# Patient Record
Sex: Female | Born: 1950 | Race: White | Hispanic: No | Marital: Married | Smoking: Never smoker
Health system: Southern US, Community
[De-identification: ages and names within clinical notes are randomized; demographics above are authoritative.]

## PROBLEM LIST (undated history)

## (undated) DIAGNOSIS — M1990 Unspecified osteoarthritis, unspecified site: Secondary | ICD-10-CM

## (undated) DIAGNOSIS — Z789 Other specified health status: Secondary | ICD-10-CM

## (undated) DIAGNOSIS — R011 Cardiac murmur, unspecified: Secondary | ICD-10-CM

## (undated) DIAGNOSIS — R7303 Prediabetes: Secondary | ICD-10-CM

## (undated) DIAGNOSIS — K219 Gastro-esophageal reflux disease without esophagitis: Secondary | ICD-10-CM

## (undated) DIAGNOSIS — S83209A Unspecified tear of unspecified meniscus, current injury, unspecified knee, initial encounter: Secondary | ICD-10-CM

## (undated) DIAGNOSIS — J45909 Unspecified asthma, uncomplicated: Secondary | ICD-10-CM

## (undated) DIAGNOSIS — Z973 Presence of spectacles and contact lenses: Secondary | ICD-10-CM

## (undated) HISTORY — PX: CHOLECYSTECTOMY: SHX55

## (undated) HISTORY — PX: TUBAL LIGATION: SHX77

## (undated) HISTORY — PX: HERNIA REPAIR: SHX51

---

## 1999-05-05 ENCOUNTER — Encounter: Payer: Self-pay | Admitting: Obstetrics and Gynecology

## 1999-05-05 ENCOUNTER — Encounter: Admission: RE | Admit: 1999-05-05 | Discharge: 1999-05-05 | Payer: Self-pay | Admitting: Obstetrics and Gynecology

## 2000-05-08 ENCOUNTER — Encounter: Admission: RE | Admit: 2000-05-08 | Discharge: 2000-05-08 | Payer: Self-pay | Admitting: Obstetrics and Gynecology

## 2000-05-08 ENCOUNTER — Encounter: Payer: Self-pay | Admitting: Obstetrics and Gynecology

## 2000-07-03 ENCOUNTER — Other Ambulatory Visit: Admission: RE | Admit: 2000-07-03 | Discharge: 2000-07-03 | Payer: Self-pay | Admitting: Obstetrics and Gynecology

## 2000-07-03 ENCOUNTER — Encounter (INDEPENDENT_AMBULATORY_CARE_PROVIDER_SITE_OTHER): Payer: Self-pay

## 2001-05-08 ENCOUNTER — Encounter: Admission: RE | Admit: 2001-05-08 | Discharge: 2001-05-08 | Payer: Self-pay | Admitting: Obstetrics and Gynecology

## 2001-05-08 ENCOUNTER — Encounter: Payer: Self-pay | Admitting: Obstetrics and Gynecology

## 2001-06-06 ENCOUNTER — Ambulatory Visit (HOSPITAL_COMMUNITY): Admission: RE | Admit: 2001-06-06 | Discharge: 2001-06-06 | Payer: Self-pay | Admitting: Gastroenterology

## 2001-12-17 ENCOUNTER — Encounter (INDEPENDENT_AMBULATORY_CARE_PROVIDER_SITE_OTHER): Payer: Self-pay | Admitting: Specialist

## 2001-12-17 ENCOUNTER — Observation Stay (HOSPITAL_COMMUNITY): Admission: RE | Admit: 2001-12-17 | Discharge: 2001-12-18 | Payer: Self-pay

## 2002-05-13 ENCOUNTER — Encounter: Admission: RE | Admit: 2002-05-13 | Discharge: 2002-05-13 | Payer: Self-pay | Admitting: Obstetrics and Gynecology

## 2002-05-13 ENCOUNTER — Encounter: Payer: Self-pay | Admitting: Obstetrics and Gynecology

## 2003-05-13 ENCOUNTER — Encounter: Admission: RE | Admit: 2003-05-13 | Discharge: 2003-05-13 | Payer: Self-pay | Admitting: Obstetrics and Gynecology

## 2004-05-12 ENCOUNTER — Encounter: Admission: RE | Admit: 2004-05-12 | Discharge: 2004-05-12 | Payer: Self-pay | Admitting: Obstetrics and Gynecology

## 2005-05-03 ENCOUNTER — Encounter: Admission: RE | Admit: 2005-05-03 | Discharge: 2005-05-03 | Payer: Self-pay | Admitting: Obstetrics and Gynecology

## 2005-08-16 ENCOUNTER — Encounter: Admission: RE | Admit: 2005-08-16 | Discharge: 2005-08-16 | Payer: Self-pay | Admitting: Family Medicine

## 2006-05-10 ENCOUNTER — Encounter: Admission: RE | Admit: 2006-05-10 | Discharge: 2006-05-10 | Payer: Self-pay | Admitting: Obstetrics and Gynecology

## 2007-04-25 ENCOUNTER — Encounter: Admission: RE | Admit: 2007-04-25 | Discharge: 2007-04-25 | Payer: Self-pay | Admitting: Obstetrics and Gynecology

## 2008-04-27 ENCOUNTER — Encounter: Admission: RE | Admit: 2008-04-27 | Discharge: 2008-04-27 | Payer: Self-pay | Admitting: Obstetrics and Gynecology

## 2009-04-26 ENCOUNTER — Encounter: Admission: RE | Admit: 2009-04-26 | Discharge: 2009-04-26 | Payer: Self-pay | Admitting: Obstetrics and Gynecology

## 2009-12-29 ENCOUNTER — Other Ambulatory Visit: Admission: RE | Admit: 2009-12-29 | Discharge: 2009-12-29 | Payer: Self-pay | Admitting: Family Medicine

## 2010-04-05 ENCOUNTER — Encounter: Admission: RE | Admit: 2010-04-05 | Discharge: 2010-04-05 | Payer: Self-pay | Admitting: Family Medicine

## 2010-09-29 NOTE — Op Note (Signed)
[name], [name]                          ACCOUNT NO.:  [id number]   MEDICAL RECORD NO.:  [id number]                   PATIENT TYPE:  AMB   LOCATION:  DAY                                  FACILITY:  [facility]   PHYSICIAN:  Skeet Simmer., M.D.         DATE OF BIRTH:  1951-04-15   DATE OF PROCEDURE:  12/17/2001  DATE OF DISCHARGE:                                 OPERATIVE REPORT   PREOPERATIVE DIAGNOSIS:  Symptomatic gallstones.   POSTOPERATIVE DIAGNOSIS:  Symptomatic gallstones.   OPERATION:  Laparoscopic cholecystectomy.   SURGEON:  Zigmund Daniel, M.D.   ASSISTANT:  Anselm Pancoast. Zachery Dakins, M.D.   ANESTHESIA:  General.   DESCRIPTION OF PROCEDURE:  Following successful induction of general  anesthesia, adequate monitoring, and routine preparation and draping of the  abdomen, I made a short transverse infraumbilical incision at the site of  previous laparoscopic incision after first anesthetizing the area with 0.5%  Marcaine with epinephrine.  I then dissected down to the fascia, opened it  longitudinally, and opened peritoneum bluntly and put my finger in to assure  that there were no adhesions of viscera to the region.  I put in a 0 Vicryl  pursestring suture in the fascia and secured a Hasson cannula and inflated  the abdomen with CO2.  Laparoscopy disclosed no abnormalities.  I positioned  the patient head-up, foot-down, and tilted to the left and then anesthetized  three additional port sites and put in two 5 mm lateral ports and a 10 mm  epigastric port and exposed the gallbladder.  I retracted the fundus upward  and to the right.  In doing this retraction, a tear occurred, and most of  the bile spilled out.  I suctioned the bile away and regrasped the  gallbladder and pulled it upward.  I grasped the infundibulum and pulled it  laterally and dissected in the hepatoduodenal ligament until I clearly  identified the cystic duct emerging from the infundibulum of  the  gallbladder.  I clipped it with four clips and cut between the two which  were closest to the gallbladder.  I then dissected out the cystic artery and  a couple of its branches and clipped and divided them.  That provided good  mobility of the gallbladder.  I dissected it from the liver using spatula  cautery, gaining hemostasis with the cautery.  I then detached it from the  liver, placed it in a plastic pouch, and removed it through the umbilical  incision.  I then checked again for hemostasis and copiously irrigated the  right upper quadrant and removed the irrigant.  Sponge, needle, and  instrument counts were correct.  I removed the lateral ports under direct  vision, then allowed the CO2 to escape, and removed the epigastric port.  I  closed all skin incisions with intracuticular 4-0 Vicryl and Steri-Strips.  The umbilical incision was closed using the tying  of the pursestring suture.  The patient was stable throughout the procedure.                                                Skeet Simmer., M.D.    Elvis Coil  D:  12/17/2001  T:  12/20/2001  Job:  [id number]

## 2010-09-29 NOTE — Procedures (Signed)
[facility]  Patient:    Deanna [name], Divirgilio [facility] Visit Number: [id number] MRN: [id number]          Service Type: END Location: ENDO Attending Physician:  Louie Bun Dictated by:   Everardo All Madilyn Fireman, M.D. Proc. Date: 06/06/01 Admit Date:  06/06/2001   CC:         Duncan Dull, M.D.   Procedure Report  PROCEDURE:  Colonoscopy.  INDICATION FOR PROCEDURE:   Family history of colon cancer in a first degree relative.  DESCRIPTION OF PROCEDURE:  The patient was placed in the left lateral decubitus position and placed on the pulse monitor with continuous low flow oxygen delivered by nasal cannula. She was sedated with 90 mg IV Demerol and 9 mg IV Versed. The Olympus video colonoscope was inserted into the rectum and advanced to the cecum, confirmed by transillumination at McBurneys point and visualization of the ileocecal valve and appendiceal orifice. The prep was good. The cecum, ascending, transverse, and descending colon all appeared normal with no masses, polyps, diverticula or other mucosal abnormalities. Within the descending colon were seen a few scattered diverticula and no other abnormalities. The rectum appeared normal and retroflexed view of the anus revealed no obvious internal hemorrhoids. The colonoscope was then withdrawn and the patient returned to the recovery room in stable condition. The patient tolerated the procedure well and there were no immediate complications.  IMPRESSION:  A few scattered diverticula otherwise normal. Dictated by:   Everardo All. Madilyn Fireman, M.D. Attending Physician:  Louie Bun DD:  06/06/01 TD:  06/07/01 Job: [id number]

## 2011-01-08 ENCOUNTER — Other Ambulatory Visit (HOSPITAL_COMMUNITY)
Admission: RE | Admit: 2011-01-08 | Discharge: 2011-01-08 | Disposition: A | Discharge status: Home / Self Care | Payer: Self-pay | Source: Ambulatory Visit | Attending: Family Medicine | Admitting: Family Medicine

## 2011-01-08 ENCOUNTER — Other Ambulatory Visit: Payer: Self-pay | Admitting: Family Medicine

## 2011-01-08 DIAGNOSIS — Z1231 Encounter for screening mammogram for malignant neoplasm of breast: Secondary | ICD-10-CM

## 2011-01-08 DIAGNOSIS — Z124 Encounter for screening for malignant neoplasm of cervix: Secondary | ICD-10-CM | POA: Insufficient documentation

## 2011-02-02 ENCOUNTER — Other Ambulatory Visit: Payer: Self-pay | Admitting: Gastroenterology

## 2011-04-04 ENCOUNTER — Ambulatory Visit
Admission: RE | Admit: 2011-04-04 | Discharge: 2011-04-04 | Disposition: A | Discharge status: Home / Self Care | Payer: BC Managed Care – PPO | Source: Ambulatory Visit | Attending: Family Medicine | Admitting: Family Medicine

## 2011-04-04 DIAGNOSIS — Z1231 Encounter for screening mammogram for malignant neoplasm of breast: Secondary | ICD-10-CM

## 2012-01-10 ENCOUNTER — Other Ambulatory Visit: Payer: Self-pay | Admitting: Family Medicine

## 2012-01-10 ENCOUNTER — Other Ambulatory Visit (HOSPITAL_COMMUNITY)
Admission: RE | Admit: 2012-01-10 | Discharge: 2012-01-10 | Disposition: A | Discharge status: Home / Self Care | Payer: BC Managed Care – PPO | Source: Ambulatory Visit | Attending: Family Medicine | Admitting: Family Medicine

## 2012-01-10 DIAGNOSIS — Z124 Encounter for screening for malignant neoplasm of cervix: Secondary | ICD-10-CM | POA: Insufficient documentation

## 2012-01-10 DIAGNOSIS — Z1231 Encounter for screening mammogram for malignant neoplasm of breast: Secondary | ICD-10-CM

## 2012-04-09 ENCOUNTER — Ambulatory Visit
Admission: RE | Admit: 2012-04-09 | Discharge: 2012-04-09 | Disposition: A | Discharge status: Home / Self Care | Payer: BC Managed Care – PPO | Source: Ambulatory Visit | Attending: Family Medicine | Admitting: Family Medicine

## 2012-04-09 DIAGNOSIS — Z1231 Encounter for screening mammogram for malignant neoplasm of breast: Secondary | ICD-10-CM

## 2013-01-19 ENCOUNTER — Other Ambulatory Visit (HOSPITAL_COMMUNITY)
Admission: RE | Admit: 2013-01-19 | Discharge: 2013-01-19 | Disposition: A | Discharge status: Home / Self Care | Payer: BC Managed Care – PPO | Source: Ambulatory Visit | Attending: Family Medicine | Admitting: Family Medicine

## 2013-01-19 ENCOUNTER — Other Ambulatory Visit: Payer: Self-pay

## 2013-01-19 ENCOUNTER — Other Ambulatory Visit: Payer: Self-pay | Admitting: Family Medicine

## 2013-01-19 DIAGNOSIS — Z124 Encounter for screening for malignant neoplasm of cervix: Secondary | ICD-10-CM | POA: Insufficient documentation

## 2013-01-19 DIAGNOSIS — Z1231 Encounter for screening mammogram for malignant neoplasm of breast: Secondary | ICD-10-CM

## 2013-04-17 ENCOUNTER — Ambulatory Visit
Admission: RE | Admit: 2013-04-17 | Discharge: 2013-04-17 | Disposition: A | Discharge status: Home / Self Care | Payer: BC Managed Care – PPO | Source: Ambulatory Visit

## 2013-04-17 DIAGNOSIS — Z1231 Encounter for screening mammogram for malignant neoplasm of breast: Secondary | ICD-10-CM

## 2014-02-04 ENCOUNTER — Other Ambulatory Visit: Payer: Self-pay

## 2014-02-04 DIAGNOSIS — Z1231 Encounter for screening mammogram for malignant neoplasm of breast: Secondary | ICD-10-CM

## 2014-04-15 ENCOUNTER — Ambulatory Visit
Admission: RE | Admit: 2014-04-15 | Discharge: 2014-04-15 | Disposition: A | Discharge status: Home / Self Care | Payer: BC Managed Care – PPO | Source: Ambulatory Visit

## 2014-04-15 DIAGNOSIS — Z1231 Encounter for screening mammogram for malignant neoplasm of breast: Secondary | ICD-10-CM

## 2014-06-25 DIAGNOSIS — R7309 Other abnormal glucose: Secondary | ICD-10-CM | POA: Diagnosis not present

## 2014-06-25 DIAGNOSIS — Z23 Encounter for immunization: Secondary | ICD-10-CM | POA: Diagnosis not present

## 2014-12-29 ENCOUNTER — Other Ambulatory Visit: Payer: Self-pay

## 2014-12-29 DIAGNOSIS — Z1231 Encounter for screening mammogram for malignant neoplasm of breast: Secondary | ICD-10-CM

## 2015-01-29 DIAGNOSIS — Z23 Encounter for immunization: Secondary | ICD-10-CM | POA: Diagnosis not present

## 2015-02-09 DIAGNOSIS — R7309 Other abnormal glucose: Secondary | ICD-10-CM | POA: Diagnosis not present

## 2015-02-09 DIAGNOSIS — Z136 Encounter for screening for cardiovascular disorders: Secondary | ICD-10-CM | POA: Diagnosis not present

## 2015-02-09 DIAGNOSIS — K921 Melena: Secondary | ICD-10-CM | POA: Diagnosis not present

## 2015-02-09 DIAGNOSIS — Z0000 Encounter for general adult medical examination without abnormal findings: Secondary | ICD-10-CM | POA: Diagnosis not present

## 2015-02-09 DIAGNOSIS — K219 Gastro-esophageal reflux disease without esophagitis: Secondary | ICD-10-CM | POA: Diagnosis not present

## 2015-02-14 ENCOUNTER — Other Ambulatory Visit: Payer: Self-pay | Admitting: Family Medicine

## 2015-02-14 DIAGNOSIS — M810 Age-related osteoporosis without current pathological fracture: Secondary | ICD-10-CM

## 2015-02-16 DIAGNOSIS — Z1211 Encounter for screening for malignant neoplasm of colon: Secondary | ICD-10-CM | POA: Diagnosis not present

## 2015-03-10 DIAGNOSIS — J0140 Acute pansinusitis, unspecified: Secondary | ICD-10-CM | POA: Diagnosis not present

## 2015-04-18 ENCOUNTER — Other Ambulatory Visit: Payer: Self-pay | Admitting: Family Medicine

## 2015-04-18 DIAGNOSIS — Z13820 Encounter for screening for osteoporosis: Secondary | ICD-10-CM

## 2015-04-19 ENCOUNTER — Other Ambulatory Visit: Payer: Self-pay | Admitting: Family Medicine

## 2015-04-19 DIAGNOSIS — M810 Age-related osteoporosis without current pathological fracture: Secondary | ICD-10-CM

## 2015-04-19 DIAGNOSIS — M8580 Other specified disorders of bone density and structure, unspecified site: Secondary | ICD-10-CM

## 2015-04-20 ENCOUNTER — Ambulatory Visit
Admission: RE | Admit: 2015-04-20 | Discharge: 2015-04-20 | Disposition: A | Discharge status: Home / Self Care | Payer: Medicare Other | Source: Ambulatory Visit

## 2015-04-20 ENCOUNTER — Ambulatory Visit
Admission: RE | Admit: 2015-04-20 | Discharge: 2015-04-20 | Disposition: A | Discharge status: Home / Self Care | Payer: Medicare Other | Source: Ambulatory Visit | Attending: Family Medicine | Admitting: Family Medicine

## 2015-04-20 ENCOUNTER — Ambulatory Visit: Payer: Self-pay

## 2015-04-20 DIAGNOSIS — Z1231 Encounter for screening mammogram for malignant neoplasm of breast: Secondary | ICD-10-CM

## 2015-04-20 DIAGNOSIS — M8580 Other specified disorders of bone density and structure, unspecified site: Secondary | ICD-10-CM

## 2015-04-20 DIAGNOSIS — Z780 Asymptomatic menopausal state: Secondary | ICD-10-CM | POA: Diagnosis not present

## 2015-08-18 DIAGNOSIS — Z6841 Body Mass Index (BMI) 40.0 and over, adult: Secondary | ICD-10-CM | POA: Diagnosis not present

## 2015-08-18 DIAGNOSIS — K219 Gastro-esophageal reflux disease without esophagitis: Secondary | ICD-10-CM | POA: Diagnosis not present

## 2015-08-18 DIAGNOSIS — Z713 Dietary counseling and surveillance: Secondary | ICD-10-CM | POA: Diagnosis not present

## 2015-08-18 DIAGNOSIS — Z23 Encounter for immunization: Secondary | ICD-10-CM | POA: Diagnosis not present

## 2015-08-18 DIAGNOSIS — R7301 Impaired fasting glucose: Secondary | ICD-10-CM | POA: Diagnosis not present

## 2015-12-07 DIAGNOSIS — Z1283 Encounter for screening for malignant neoplasm of skin: Secondary | ICD-10-CM | POA: Diagnosis not present

## 2015-12-07 DIAGNOSIS — L304 Erythema intertrigo: Secondary | ICD-10-CM | POA: Diagnosis not present

## 2016-01-07 DIAGNOSIS — Z9049 Acquired absence of other specified parts of digestive tract: Secondary | ICD-10-CM | POA: Diagnosis not present

## 2016-01-07 DIAGNOSIS — R221 Localized swelling, mass and lump, neck: Secondary | ICD-10-CM | POA: Diagnosis not present

## 2016-01-07 DIAGNOSIS — Z9851 Tubal ligation status: Secondary | ICD-10-CM | POA: Diagnosis not present

## 2016-01-07 DIAGNOSIS — R918 Other nonspecific abnormal finding of lung field: Secondary | ICD-10-CM | POA: Diagnosis not present

## 2016-01-08 DIAGNOSIS — R590 Localized enlarged lymph nodes: Secondary | ICD-10-CM | POA: Diagnosis not present

## 2016-01-09 DIAGNOSIS — R221 Localized swelling, mass and lump, neck: Secondary | ICD-10-CM | POA: Diagnosis not present

## 2016-01-10 ENCOUNTER — Other Ambulatory Visit: Payer: Self-pay | Admitting: Family Medicine

## 2016-01-10 DIAGNOSIS — R221 Localized swelling, mass and lump, neck: Secondary | ICD-10-CM

## 2016-01-11 ENCOUNTER — Ambulatory Visit
Admission: RE | Admit: 2016-01-11 | Discharge: 2016-01-11 | Disposition: A | Discharge status: Home / Self Care | Payer: Medicare Other | Source: Ambulatory Visit | Attending: Family Medicine | Admitting: Family Medicine

## 2016-01-11 DIAGNOSIS — R221 Localized swelling, mass and lump, neck: Secondary | ICD-10-CM | POA: Diagnosis not present

## 2016-01-11 MED ORDER — IOPAMIDOL (ISOVUE-300) INJECTION 61%
75.0000 mL | Freq: Once | INTRAVENOUS | Status: AC | PRN
  Administered 2016-01-11: 75 mL via INTRAVENOUS

## 2016-01-20 ENCOUNTER — Other Ambulatory Visit: Payer: No Typology Code available for payment source

## 2016-02-07 ENCOUNTER — Other Ambulatory Visit: Payer: Self-pay | Admitting: Family Medicine

## 2016-02-07 DIAGNOSIS — Z1231 Encounter for screening mammogram for malignant neoplasm of breast: Secondary | ICD-10-CM

## 2016-02-07 DIAGNOSIS — Z23 Encounter for immunization: Secondary | ICD-10-CM | POA: Diagnosis not present

## 2016-02-14 DIAGNOSIS — Z86010 Personal history of colonic polyps: Secondary | ICD-10-CM | POA: Diagnosis not present

## 2016-02-14 DIAGNOSIS — K5730 Diverticulosis of large intestine without perforation or abscess without bleeding: Secondary | ICD-10-CM | POA: Diagnosis not present

## 2016-04-25 ENCOUNTER — Ambulatory Visit: Payer: Medicare Other

## 2016-04-25 ENCOUNTER — Ambulatory Visit
Admission: RE | Admit: 2016-04-25 | Discharge: 2016-04-25 | Disposition: A | Discharge status: Home / Self Care | Payer: Medicare Other | Source: Ambulatory Visit | Attending: Family Medicine | Admitting: Family Medicine

## 2016-04-25 DIAGNOSIS — Z1231 Encounter for screening mammogram for malignant neoplasm of breast: Secondary | ICD-10-CM | POA: Diagnosis not present

## 2016-06-20 ENCOUNTER — Other Ambulatory Visit: Payer: Self-pay | Admitting: Family Medicine

## 2016-06-20 ENCOUNTER — Other Ambulatory Visit (HOSPITAL_COMMUNITY)
Admission: RE | Admit: 2016-06-20 | Discharge: 2016-06-20 | Disposition: A | Discharge status: Home / Self Care | Payer: Medicare Other | Source: Ambulatory Visit | Attending: Family Medicine | Admitting: Family Medicine

## 2016-06-20 DIAGNOSIS — Z0000 Encounter for general adult medical examination without abnormal findings: Secondary | ICD-10-CM | POA: Diagnosis not present

## 2016-06-20 DIAGNOSIS — K219 Gastro-esophageal reflux disease without esophagitis: Secondary | ICD-10-CM | POA: Diagnosis not present

## 2016-06-20 DIAGNOSIS — R7303 Prediabetes: Secondary | ICD-10-CM | POA: Diagnosis not present

## 2016-06-20 DIAGNOSIS — Z124 Encounter for screening for malignant neoplasm of cervix: Secondary | ICD-10-CM | POA: Diagnosis not present

## 2016-06-22 LAB — CYTOLOGY - PAP: DIAGNOSIS: NEGATIVE

## 2016-10-10 DIAGNOSIS — R062 Wheezing: Secondary | ICD-10-CM | POA: Diagnosis not present

## 2016-10-10 DIAGNOSIS — M25562 Pain in left knee: Secondary | ICD-10-CM | POA: Diagnosis not present

## 2016-10-24 DIAGNOSIS — M25562 Pain in left knee: Secondary | ICD-10-CM | POA: Diagnosis not present

## 2016-11-30 DIAGNOSIS — M25562 Pain in left knee: Secondary | ICD-10-CM | POA: Diagnosis not present

## 2016-12-14 DIAGNOSIS — M84469A Pathological fracture, unspecified tibia and fibula, initial encounter for fracture: Secondary | ICD-10-CM | POA: Diagnosis not present

## 2016-12-14 DIAGNOSIS — S83232A Complex tear of medial meniscus, current injury, left knee, initial encounter: Secondary | ICD-10-CM | POA: Diagnosis not present

## 2016-12-14 DIAGNOSIS — M1712 Unilateral primary osteoarthritis, left knee: Secondary | ICD-10-CM | POA: Diagnosis not present

## 2016-12-19 DIAGNOSIS — M25562 Pain in left knee: Secondary | ICD-10-CM | POA: Diagnosis not present

## 2016-12-19 DIAGNOSIS — R7303 Prediabetes: Secondary | ICD-10-CM | POA: Diagnosis not present

## 2016-12-19 DIAGNOSIS — K219 Gastro-esophageal reflux disease without esophagitis: Secondary | ICD-10-CM | POA: Diagnosis not present

## 2016-12-28 DIAGNOSIS — E119 Type 2 diabetes mellitus without complications: Secondary | ICD-10-CM | POA: Diagnosis not present

## 2016-12-28 DIAGNOSIS — H5203 Hypermetropia, bilateral: Secondary | ICD-10-CM | POA: Diagnosis not present

## 2016-12-28 DIAGNOSIS — H2513 Age-related nuclear cataract, bilateral: Secondary | ICD-10-CM | POA: Diagnosis not present

## 2016-12-28 DIAGNOSIS — H04123 Dry eye syndrome of bilateral lacrimal glands: Secondary | ICD-10-CM | POA: Diagnosis not present

## 2017-01-08 ENCOUNTER — Encounter (HOSPITAL_BASED_OUTPATIENT_CLINIC_OR_DEPARTMENT_OTHER): Payer: Self-pay | Admitting: *Deleted

## 2017-01-08 NOTE — Progress Notes (Signed)
Pt instructed npo pmn 8/29 x zantac w sip of water.  Pt to bring inhaler w her.  To [facility] 8/30 @ 1130.  Labs, most recent ekg requested from Dr. Darcus Austin office. Pt states she is a "difficult stick".

## 2017-01-09 ENCOUNTER — Ambulatory Visit: Payer: Self-pay | Admitting: Orthopedic Surgery

## 2017-01-10 ENCOUNTER — Encounter (HOSPITAL_BASED_OUTPATIENT_CLINIC_OR_DEPARTMENT_OTHER)
Admission: RE | Disposition: A | Discharge status: Home / Self Care | Payer: Self-pay | Source: Ambulatory Visit | Attending: Specialist

## 2017-01-10 ENCOUNTER — Ambulatory Visit (HOSPITAL_COMMUNITY): Payer: Medicare Other

## 2017-01-10 ENCOUNTER — Ambulatory Visit (HOSPITAL_BASED_OUTPATIENT_CLINIC_OR_DEPARTMENT_OTHER): Payer: Medicare Other | Admitting: Anesthesiology

## 2017-01-10 ENCOUNTER — Ambulatory Visit (HOSPITAL_BASED_OUTPATIENT_CLINIC_OR_DEPARTMENT_OTHER)
Admission: RE | Admit: 2017-01-10 | Discharge: 2017-01-10 | Disposition: A | Discharge status: Home / Self Care | Payer: Medicare Other | Source: Ambulatory Visit | Attending: Specialist | Admitting: Specialist

## 2017-01-10 ENCOUNTER — Encounter (HOSPITAL_BASED_OUTPATIENT_CLINIC_OR_DEPARTMENT_OTHER): Payer: Self-pay

## 2017-01-10 DIAGNOSIS — Z6841 Body Mass Index (BMI) 40.0 and over, adult: Secondary | ICD-10-CM | POA: Insufficient documentation

## 2017-01-10 DIAGNOSIS — S82142A Displaced bicondylar fracture of left tibia, initial encounter for closed fracture: Secondary | ICD-10-CM | POA: Diagnosis not present

## 2017-01-10 DIAGNOSIS — Z7982 Long term (current) use of aspirin: Secondary | ICD-10-CM | POA: Insufficient documentation

## 2017-01-10 DIAGNOSIS — X58XXXA Exposure to other specified factors, initial encounter: Secondary | ICD-10-CM | POA: Insufficient documentation

## 2017-01-10 DIAGNOSIS — K219 Gastro-esophageal reflux disease without esophagitis: Secondary | ICD-10-CM | POA: Diagnosis not present

## 2017-01-10 DIAGNOSIS — M25562 Pain in left knee: Secondary | ICD-10-CM | POA: Diagnosis present

## 2017-01-10 DIAGNOSIS — M84352A Stress fracture, left femur, initial encounter for fracture: Secondary | ICD-10-CM | POA: Diagnosis not present

## 2017-01-10 DIAGNOSIS — M84452A Pathological fracture, left femur, initial encounter for fracture: Secondary | ICD-10-CM | POA: Diagnosis not present

## 2017-01-10 DIAGNOSIS — R52 Pain, unspecified: Secondary | ICD-10-CM

## 2017-01-10 DIAGNOSIS — M1712 Unilateral primary osteoarthritis, left knee: Secondary | ICD-10-CM | POA: Insufficient documentation

## 2017-01-10 DIAGNOSIS — S83232A Complex tear of medial meniscus, current injury, left knee, initial encounter: Secondary | ICD-10-CM | POA: Diagnosis not present

## 2017-01-10 DIAGNOSIS — M84462A Pathological fracture, left tibia, initial encounter for fracture: Secondary | ICD-10-CM | POA: Insufficient documentation

## 2017-01-10 DIAGNOSIS — M94262 Chondromalacia, left knee: Secondary | ICD-10-CM | POA: Insufficient documentation

## 2017-01-10 DIAGNOSIS — M179 Osteoarthritis of knee, unspecified: Secondary | ICD-10-CM | POA: Diagnosis not present

## 2017-01-10 DIAGNOSIS — Z9889 Other specified postprocedural states: Secondary | ICD-10-CM

## 2017-01-10 DIAGNOSIS — M84362A Stress fracture, left tibia, initial encounter for fracture: Secondary | ICD-10-CM | POA: Diagnosis not present

## 2017-01-10 DIAGNOSIS — G8918 Other acute postprocedural pain: Secondary | ICD-10-CM | POA: Diagnosis not present

## 2017-01-10 HISTORY — DX: Unspecified osteoarthritis, unspecified site: M19.90

## 2017-01-10 HISTORY — DX: Unspecified tear of unspecified meniscus, current injury, unspecified knee, initial encounter: S83.209A

## 2017-01-10 HISTORY — DX: Other specified health status: Z78.9

## 2017-01-10 HISTORY — DX: Presence of spectacles and contact lenses: Z97.3

## 2017-01-10 HISTORY — DX: Prediabetes: R73.03

## 2017-01-10 HISTORY — PX: KNEE ARTHROSCOPY WITH MEDIAL MENISECTOMY: SHX5651

## 2017-01-10 HISTORY — DX: Gastro-esophageal reflux disease without esophagitis: K21.9

## 2017-01-10 SURGERY — ARTHROSCOPY, KNEE, WITH MEDIAL MENISCECTOMY
Anesthesia: General | Site: Knee | Laterality: Left

## 2017-01-10 MED ORDER — CHLORHEXIDINE GLUCONATE 4 % EX LIQD
60.0000 mL | Freq: Once | CUTANEOUS | Status: DC
  Filled 2017-01-10: qty 118

## 2017-01-10 MED ORDER — LIDOCAINE-EPINEPHRINE 2 %-1:100000 IJ SOLN
INTRAMUSCULAR | Status: DC | PRN
  Administered 2017-01-10: 100 mL via INTRADERMAL
  Administered 2017-01-10: 5 mL via PERINEURAL

## 2017-01-10 MED ORDER — OXYCODONE HCL 5 MG PO TABS
ORAL_TABLET | ORAL | Status: AC
  Filled 2017-01-10: qty 1

## 2017-01-10 MED ORDER — FENTANYL CITRATE (PF) 100 MCG/2ML IJ SOLN
INTRAMUSCULAR | Status: DC | PRN
  Administered 2017-01-10 (×2): 50 ug via INTRAVENOUS
  Administered 2017-01-10: 100 ug via INTRAVENOUS

## 2017-01-10 MED ORDER — HYDROMORPHONE HCL-NACL 0.5-0.9 MG/ML-% IV SOSY
PREFILLED_SYRINGE | INTRAVENOUS | Status: AC
  Filled 2017-01-10: qty 1

## 2017-01-10 MED ORDER — FENTANYL CITRATE (PF) 100 MCG/2ML IJ SOLN
INTRAMUSCULAR | Status: AC
  Filled 2017-01-10: qty 2

## 2017-01-10 MED ORDER — PROPOFOL 10 MG/ML IV BOLUS
INTRAVENOUS | Status: DC | PRN
  Administered 2017-01-10: 200 mg via INTRAVENOUS

## 2017-01-10 MED ORDER — CEFAZOLIN SODIUM-DEXTROSE 2-4 GM/100ML-% IV SOLN
2.0000 g | INTRAVENOUS | Status: AC
  Administered 2017-01-10: 2 g via INTRAVENOUS
  Filled 2017-01-10: qty 100

## 2017-01-10 MED ORDER — MORPHINE SULFATE (PF) 4 MG/ML IV SOLN
INTRAVENOUS | Status: DC | PRN
  Administered 2017-01-10: 4 mg via INTRAVENOUS

## 2017-01-10 MED ORDER — LIDOCAINE 2% (20 MG/ML) 5 ML SYRINGE
INTRAMUSCULAR | Status: AC
  Filled 2017-01-10: qty 5

## 2017-01-10 MED ORDER — OXYCODONE HCL 5 MG PO TABS
5.0000 mg | ORAL_TABLET | Freq: Once | ORAL | Status: AC
  Administered 2017-01-10: 5 mg via ORAL
  Filled 2017-01-10: qty 1

## 2017-01-10 MED ORDER — HYDROMORPHONE HCL 1 MG/ML IJ SOLN
0.2500 mg | INTRAMUSCULAR | Status: DC | PRN
  Administered 2017-01-10 (×3): 0.5 mg via INTRAVENOUS
  Filled 2017-01-10: qty 0.5

## 2017-01-10 MED ORDER — ONDANSETRON HCL 4 MG/2ML IJ SOLN
INTRAMUSCULAR | Status: AC
  Filled 2017-01-10: qty 2

## 2017-01-10 MED ORDER — MIDAZOLAM HCL 2 MG/2ML IJ SOLN
2.0000 mg | Freq: Once | INTRAMUSCULAR | Status: AC
  Administered 2017-01-10: 2 mg via INTRAVENOUS
  Filled 2017-01-10: qty 2

## 2017-01-10 MED ORDER — LACTATED RINGERS IV SOLN
INTRAVENOUS | Status: DC
  Administered 2017-01-10: 12:00:00 via INTRAVENOUS
  Filled 2017-01-10: qty 1000

## 2017-01-10 MED ORDER — OXYCODONE HCL 5 MG PO TABS: 5.0000 mg | ORAL_TABLET | ORAL | 0 refills | Status: DC | PRN

## 2017-01-10 MED ORDER — SODIUM CHLORIDE 0.9 % IR SOLN
Status: DC | PRN
  Administered 2017-01-10: 6000 mL

## 2017-01-10 MED ORDER — ONDANSETRON HCL 4 MG/2ML IJ SOLN
INTRAMUSCULAR | Status: DC | PRN
  Administered 2017-01-10: 4 mg via INTRAVENOUS

## 2017-01-10 MED ORDER — CEFAZOLIN SODIUM-DEXTROSE 2-4 GM/100ML-% IV SOLN
INTRAVENOUS | Status: AC
  Filled 2017-01-10: qty 100

## 2017-01-10 MED ORDER — ASPIRIN EC 325 MG PO TBEC: 325.0000 mg | DELAYED_RELEASE_TABLET | Freq: Two times a day (BID) | ORAL | 3 refills | Status: AC

## 2017-01-10 MED ORDER — CEPHALEXIN 500 MG PO CAPS: 500.0000 mg | ORAL_CAPSULE | Freq: Three times a day (TID) | ORAL | 0 refills | Status: DC

## 2017-01-10 MED ORDER — DEXAMETHASONE SODIUM PHOSPHATE 10 MG/ML IJ SOLN
INTRAMUSCULAR | Status: DC | PRN
  Administered 2017-01-10: 10 mg via INTRAVENOUS

## 2017-01-10 MED ORDER — DEXAMETHASONE SODIUM PHOSPHATE 10 MG/ML IJ SOLN
INTRAMUSCULAR | Status: AC
  Filled 2017-01-10: qty 1

## 2017-01-10 MED ORDER — FENTANYL CITRATE (PF) 100 MCG/2ML IJ SOLN
100.0000 ug | Freq: Once | INTRAMUSCULAR | Status: AC
  Administered 2017-01-10: 50 ug via INTRAVENOUS
  Filled 2017-01-10: qty 2

## 2017-01-10 MED ORDER — MIDAZOLAM HCL 2 MG/2ML IJ SOLN
INTRAMUSCULAR | Status: AC
  Filled 2017-01-10: qty 2

## 2017-01-10 MED ORDER — PROMETHAZINE HCL 25 MG/ML IJ SOLN
6.2500 mg | INTRAMUSCULAR | Status: DC | PRN
  Filled 2017-01-10: qty 1

## 2017-01-10 MED ORDER — PROPOFOL 10 MG/ML IV BOLUS
INTRAVENOUS | Status: AC
  Filled 2017-01-10: qty 20

## 2017-01-10 MED ORDER — MORPHINE SULFATE (PF) 4 MG/ML IV SOLN
INTRAVENOUS | Status: AC
  Filled 2017-01-10: qty 1

## 2017-01-10 MED ORDER — BUPIVACAINE HCL 0.25 % IJ SOLN
INTRAMUSCULAR | Status: DC | PRN
  Administered 2017-01-10: 10 mL
  Administered 2017-01-10: 20 mL

## 2017-01-10 MED ORDER — BUPIVACAINE HCL (PF) 0.25 % IJ SOLN
INTRAMUSCULAR | Status: AC
  Filled 2017-01-10: qty 30

## 2017-01-10 MED ORDER — ROPIVACAINE HCL 7.5 MG/ML IJ SOLN
INTRAMUSCULAR | Status: DC | PRN
  Administered 2017-01-10: 20 mL via PERINEURAL

## 2017-01-10 MED FILL — CEPHALEXIN 500 MG CAPSULE: 500 | 4 days supply | Qty: 12 | Fill #0

## 2017-01-10 MED FILL — oxyCODONE HCL 5 MG TABS: 5 | 4 days supply | Qty: 40 | Fill #0

## 2017-01-10 SURGICAL SUPPLY — 39 items
BANDAGE ESMARK 6X9 LF (GAUZE/BANDAGES/DRESSINGS) ×2 IMPLANT
BLADE CUDA GRT WHITE 3.5 (BLADE) ×3 IMPLANT
BNDG CMPR 9X6 STRL LF SNTH (GAUZE/BANDAGES/DRESSINGS) ×2
BNDG ESMARK 6X9 LF (GAUZE/BANDAGES/DRESSINGS) ×3
BNDG GAUZE ELAST 4 BULKY (GAUZE/BANDAGES/DRESSINGS) ×3 IMPLANT
DRAPE ARTHROSCOPY W/POUCH 114 (DRAPES) ×3 IMPLANT
DRAPE C-ARM 42X72 X-RAY (DRAPES) ×3 IMPLANT
DRAPE INCISE IOBAN 66X45 STRL (DRAPES) ×3 IMPLANT
DRAPE LG THREE QUARTER DISP (DRAPES) ×3 IMPLANT
DURAPREP 26ML APPLICATOR (WOUND CARE) ×3 IMPLANT
ELECT MENISCUS 165MM 90D (ELECTRODE) IMPLANT
GAUZE SPONGE 4X4 12PLY STRL LF (GAUZE/BANDAGES/DRESSINGS) ×2 IMPLANT
GAUZE XEROFORM 1X8 LF (GAUZE/BANDAGES/DRESSINGS) ×3 IMPLANT
GLOVE BIO SURGEON STRL SZ7.5 (GLOVE) ×3 IMPLANT
GLOVE BIO SURGEON STRL SZ8 (GLOVE) ×3 IMPLANT
GLOVE INDICATOR 8.0 STRL GRN (GLOVE) ×6 IMPLANT
GOWN STRL REUS W/TWL LRG LVL3 (GOWN DISPOSABLE) IMPLANT
GOWN STRL REUS W/TWL XL LVL3 (GOWN DISPOSABLE) ×6 IMPLANT
IV NS IRRIG 3000ML ARTHROMATIC (IV SOLUTION) ×6 IMPLANT
KIT KNEE SUBCHONDROPLASTY (Joint) ×3 IMPLANT
KIT MIXER ACCUMIX (KITS) ×2 IMPLANT
KIT RM TURNOVER CYSTO AR (KITS) ×3 IMPLANT
KNEE KIT SCP W/SIDE ACCUPORT (Joint) ×2 IMPLANT
KNEE WRAP E Z 3 GEL PACK (MISCELLANEOUS) ×3 IMPLANT
MANIFOLD NEPTUNE II (INSTRUMENTS) ×3 IMPLANT
NEEDLE HYPO 22GX1.5 SAFETY (NEEDLE) IMPLANT
PACK ARTHROSCOPY DSU (CUSTOM PROCEDURE TRAY) ×3 IMPLANT
PACK BASIN DAY SURGERY FS (CUSTOM PROCEDURE TRAY) ×3 IMPLANT
PAD ABD 8X10 STRL (GAUZE/BANDAGES/DRESSINGS) ×3 IMPLANT
PAD ARMBOARD 7.5X6 YLW CONV (MISCELLANEOUS) IMPLANT
PROBE BIPOLAR 50 DEGREE SUCT (MISCELLANEOUS) IMPLANT
SET ARTHROSCOPY TUBING (MISCELLANEOUS) ×3
SET ARTHROSCOPY TUBING LN (MISCELLANEOUS) ×2 IMPLANT
SUT ETHILON 4 0 PS 2 18 (SUTURE) ×3 IMPLANT
SYR CONTROL 10ML LL (SYRINGE) ×3 IMPLANT
TOWEL OR 17X24 6PK STRL BLUE (TOWEL DISPOSABLE) ×3 IMPLANT
TUBE CONNECTING 12X1/4 (SUCTIONS) IMPLANT
WAND 30 DEG SABER W/CORD (SURGICAL WAND) IMPLANT
WATER STERILE IRR 500ML POUR (IV SOLUTION) ×3 IMPLANT

## 2017-01-10 NOTE — H&P (View-Only) (Signed)
1337 timeout called with Dr. Glennon Mac. 1338 Versed 2 mg IV given and 1339 Fentanyl 50 mcg given as ordered.  Block started . Tolerated well. 1344 Block finished and tolerated well.

## 2017-01-10 NOTE — Anesthesia Postprocedure Evaluation (Signed)
Anesthesia Post Note  Patient: Deanna [name]  Procedure(s) Performed: Procedure(s) (LRB): LEFT KNEE ARTHROSCOPY, DEBRIDEMENT, PARTIAL MEDIAL MENISCECTOMY, CHONDROPLASTY, ARTHROSCOPIC ASSISTED INTERNAL FIXATION OF MEDIAL FEMORAL CONDYLE AND MEDIAL TIBIAL PLATEAU INSUFFICIENCY FRACTURES (Left)     Patient location during evaluation: PACU Anesthesia Type: General Level of consciousness: awake Pain management: pain level controlled Vital Signs Assessment: post-procedure vital signs reviewed and stable Respiratory status: spontaneous breathing Cardiovascular status: stable Postop Assessment: no signs of nausea or vomiting Anesthetic complications: no    Last Vitals:  Vitals:   01/10/17 1645 01/10/17 1700  BP: (!) 158/86 (!) 154/75  Pulse: 91 90  Resp: 10 (!) 22  Temp:    SpO2: 95% 94%    Last Pain:  Vitals:   01/10/17 1700  TempSrc:   PainSc: 4    Pain Goal: Patients Stated Pain Goal: 5 (01/10/17 1605)               [place]

## 2017-01-10 NOTE — H&P (Signed)
Deanna [name] is an 66 y.o. female.   Chief Complaint: Left knee pain PHX:[id number] presents with joint discomfort that had been persistent for several weeks now. Despite conservative treatments, the patients discomfort has not improved. Imaging was obtained. Other conservative and surgical treatments were discussed in detail. Patient wishes to proceed with surgery as consented. Denies SOB, CP, or calf pain. No Fever, chills, or nausea/ vomiting.   Past Medical History:  Diagnosis Date  . 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 Difficult intravenous access   . GERD (gastroesophageal reflux disease)   . Pre-diabetes    last A1C=6.0 per pt  . Torn meniscus    left knee w fracture x2 to left knee  . Wears glasses     Past Surgical History:  Procedure Laterality Date  . CHOLECYSTECTOMY    . HERNIA REPAIR Right   . TUBAL LIGATION      History reviewed. No pertinent family history. Social History:  reports that she has never smoked. She has never used smokeless tobacco. She reports that she does not drink alcohol. Her drug history is not on file.  Allergies: No Known Allergies  Medications Prior to Admission  Medication Sig Dispense Refill  . albuterol (PROVENTIL HFA;VENTOLIN HFA) 108 (90 Base) MCG/ACT inhaler Inhale 2 puffs into the lungs every 4 (four) hours as needed for wheezing or shortness of breath.    Marland Kitchen aspirin EC 81 MG tablet Take 81 mg by mouth daily.    . calcium-vitamin D (OSCAL WITH D) 500-200 MG-UNIT tablet Take 1 tablet by mouth daily with breakfast.    . ibuprofen (ADVIL,MOTRIN) 200 MG tablet Take 200 mg by mouth every 6 (six) hours as needed. Pt takes 2 tabs at a time    . Omega-3 Fatty Acids (FISH OIL) 1200 MG CAPS Take 1,500 mg by mouth daily.    . Prenatal Vit-Fe Fumarate-FA (M-VIT) tablet Take 1 tablet by mouth daily.    . ranitidine (ZANTAC) 150 MG tablet Take 150 mg by mouth daily. OTC      No results found for this or any previous visit (from the past 48  hour(s)). No results found.  Review of Systems  Constitutional: Negative.   HENT: Negative.   Eyes: Negative.   Respiratory: Negative.   Cardiovascular: Negative.   Gastrointestinal: Negative.   Genitourinary: Negative.   Musculoskeletal: Positive for joint pain.  Skin: Negative.   Neurological: Negative.   Endo/Heme/Allergies: Negative.   Psychiatric/Behavioral: Negative.     Blood pressure (!) 173/84, pulse 90, temperature 98.7 F (37.1 C), temperature source Oral, resp. rate (!) 22, height 5\' 1"  (1.549 m), weight 104.3 kg (230 lb), SpO2 97 %. Physical Exam  Constitutional: She is oriented to person, place, and time. She appears well-developed.  HENT:  Head: Normocephalic.  Eyes: Pupils are equal, round, and reactive to light.  Neck: Normal range of motion.  Cardiovascular: Normal rate and intact distal pulses.   Respiratory: Effort normal.  GI: Soft.  Genitourinary:  Genitourinary Comments: Deferred  Musculoskeletal:  Left knee pain. Good stability. RLE grossly n/v intact.  Neurological: She is oriented to person, place, and time.  Skin: Skin is warm and dry.  Psychiatric: Her behavior is normal.     Assessment/Plan Left knee OA,TM,IF: Scope and ORIF as consented D/c home today F/u in office follow instructions   Jyles Sontag L, PA-C 01/10/2017, 2:12 PM

## 2017-01-10 NOTE — Anesthesia Procedure Notes (Signed)
Anesthesia Regional Block: Adductor canal block   Pre-Anesthetic Checklist: ,, timeout performed, Correct Patient, Correct Site, Correct Laterality, Correct Procedure, Correct Position, site marked, Risks and benefits discussed, pre-op evaluation,  At surgeon's request and post-op pain management  Laterality: Left  Prep: chloraprep       Needles:   Needle Type: Echogenic Needle     Needle Length: 10cm  Needle Gauge: 21     Additional Needles:   Procedures: ultrasound guided,,,,,,,,  Narrative:  Start time: 01/10/2017 1:39 PM End time: 01/10/2017 1:47 PM Injection made incrementally with aspirations every 5 mL. Anesthesiologist: Lyndle Herrlich

## 2017-01-10 NOTE — Transfer of Care (Signed)
Immediate Anesthesia Transfer of Care Note  Patient: Deanna [name]  Procedure(s) Performed: Procedure(s): LEFT KNEE ARTHROSCOPY, DEBRIDEMENT, PARTIAL MEDIAL MENISCECTOMY, CHONDROPLASTY, ARTHROSCOPIC ASSISTED INTERNAL FIXATION OF MEDIAL FEMORAL CONDYLE AND MEDIAL TIBIAL PLATEAU INSUFFICIENCY FRACTURES (Left)  Patient Location: PACU  Anesthesia Type:General  Level of Consciousness: awake and oriented  Airway & Oxygen Therapy: Patient Spontanous Breathing and Patient connected to nasal cannula oxygen  Post-op Assessment: Report given to RN and Post -op Vital signs reviewed and stable  Post vital signs: Reviewed and stable  Last Vitals:  Vitals:   01/10/17 1430 01/10/17 1605  BP: 132/73 (!) 159/79  Pulse: 87 92  Resp: 13   Temp:  36.9 C  SpO2: 98% 95%    Last Pain:  Vitals:   01/10/17 1135  TempSrc: Oral      Patients Stated Pain Goal: 5 (62/56/38 9373)  Complications: No apparent anesthesia complications

## 2017-01-10 NOTE — Discharge Instructions (Signed)
ARTHROSCOPIC KNEE SURGERY HOME CARE INSTRUCTIONS   PAIN You will be expected to have a moderate amount of pain in the affected knee for approximately two weeks.  However, the first two to four days will be the most severe in terms of the pain you will experience.  Prescriptions have been provided for you to take as needed for the pain.  The pain can be markedly reduced by using the ice/compressive bandage given.  Exchange the ice packs whenever they thaw.  During the night, keep the bandage on because it will still provide some compression for the swelling.  Also, keep the leg elevated on pillows above your heart, and this will help alleviate the pain and swelling.  MEDICATION Prescriptions have been provided to take as needed for pain. To prevent blood clots, take Aspirin 325mg  daily with a meal if not on a blood thinner and if no history of stomach ulcers.   ACTIVITY It is preferred that you stay on bedrest for approximately 24 hours.  However, you may go to the bathroom with help.  After this, you can start to be up and about progressively more.  Remember that the swelling may still increase after three to four days if you are up and doing too much.  You may put as much weight on the affected leg as pain will allow.  Use your crutches for comfort and safety.  However, as soon as you are able, you may discard the crutches and go without them.   DRESSING Keep the current dressing as dry as possible.  Two days after your surgery, you may remove the ice/compressive wrap, and surgical dressing.  You may now take a shower, but do not scrub the sounds directly with soap.  Let water rinse over these and gently wipe with your hand.  Reapply band-aids over the puncture wounds and more gauze if needed.  A slight amount of thin drainage can be normal at this time, and do not let it frighten you.  Reapply the ice/compressive wrap.  You may now repeat this every day each time you shower.  SYMPTOMS TO REPORT TO  YOUR DOCTOR  -Extreme pain.  -Extreme swelling.  -Temperature above 101 degrees that does not come down with acetaminophen     (Tylenol).  -Any changes in the feeling, color or movement of your toes.  -Extreme redness, heat, swelling or drainage at your incision  EXERCISE It is preferred that you begin to exercise on the day of your surgery.  Straight leg raises and short arc quads should be begun the afternoon or evening of surgery and continued until you come back for your follow-up appointment.   Attached is an instruction sheet on how to perform these two simple exercises.  Do these at least three times per day if not more.  You may bend your knee as much as is comfortable.  The puncture wounds may occasionally be slightly uncomfortable with bending of the knee.  Do not let this frighten you.  It is important to keep your knee motion, but do not overdo it.  If you have significant pain, simply do not bend the knee as far.   You will be given more exercises to perform at your first return visit.    Regional Anesthesia Blocks  1. Numbness or the inability to move the "blocked" extremity may last from 3-48 hours after placement. The length of time depends on the medication injected and your individual response to the medication. If the  numbness is not going away after 48 hours, call your surgeon.  2. The extremity that is blocked will need to be protected until the numbness is gone and the  Strength has returned. Because you cannot feel it, you will need to take extra care to avoid injury. Because it may be weak, you may have difficulty moving it or using it. You may not know what position it is in without looking at it while the block is in effect.  3. For blocks in the legs and feet, returning to weight bearing and walking needs to be done carefully. You will need to wait until the numbness is entirely gone and the strength has returned. You should be able to move your leg and foot normally before  you try and bear weight or walk. You will need someone to be with you when you first try to ensure you do not fall and possibly risk injury.  4. Bruising and tenderness at the needle site are common side effects and will resolve in a few days.  5. Persistent numbness or new problems with movement should be communicated to the surgeon or the [place] [phone] [place] [phone]).

## 2017-01-10 NOTE — Progress Notes (Signed)
1337 timeout called with Dr. Glennon Mac. 1338 Versed 2 mg IV given and 1339 Fentanyl 50 mcg given as ordered.  Block started . Tolerated well. 1344 Block finished and tolerated well.

## 2017-01-10 NOTE — Anesthesia Preprocedure Evaluation (Addendum)
Anesthesia Evaluation  Patient identified by MRN, date of birth, ID band Patient awake    Reviewed: Allergy & Precautions, NPO status , Patient's Chart, lab work & pertinent test results  Airway Mallampati: II  TM Distance: >3 FB Neck ROM: Full    Dental  (+) Teeth Intact, Caps,    Pulmonary    Pulmonary exam normal breath sounds clear to auscultation       Cardiovascular  Rhythm:Regular Rate:Normal     Neuro/Psych    GI/Hepatic GERD  ,  Endo/Other  Morbid obesity  Renal/GU      Musculoskeletal  (+) Arthritis ,   Abdominal (+) + obese,   Peds  Hematology   Anesthesia Other Findings   Reproductive/Obstetrics                           Anesthesia Physical Anesthesia Plan  ASA: III  Anesthesia Plan: General   Post-op Pain Management:  Regional for Post-op pain   Induction:   PONV Risk Score and Plan: 4 or greater and Ondansetron, Dexamethasone, Scopolamine patch - Pre-op and Treatment may vary due to age or medical condition  Airway Management Planned: Oral ETT  Additional Equipment:   Intra-op Plan:   Post-operative Plan: Extubation in OR  Informed Consent:   Plan Discussed with:   Anesthesia Plan Comments:        Anesthesia Quick Evaluation

## 2017-01-10 NOTE — Anesthesia Procedure Notes (Signed)
Procedure Name: LMA Insertion Date/Time: 01/10/2017 3:51 PM Performed by: Bethena Roys T Pre-anesthesia Checklist: Patient identified, Emergency Drugs available, Suction available and Patient being monitored Patient Re-evaluated:Patient Re-evaluated prior to induction Oxygen Delivery Method: Circle system utilized Preoxygenation: Pre-oxygenation with 100% oxygen Induction Type: IV induction Ventilation: Mask ventilation without difficulty LMA: LMA inserted LMA Size: 4.0 Number of attempts: 1 Airway Equipment and Method: Bite block Placement Confirmation: positive ETCO2 Tube secured with: Tape Dental Injury: Teeth and Oropharynx as per pre-operative assessment

## 2017-01-10 NOTE — Op Note (Signed)
Dictated [phone]

## 2017-01-10 NOTE — Interval H&P Note (Signed)
History and Physical Interval Note:  01/10/2017 3:56 PM  Deanna [name]  has presented today for surgery, with the diagnosis of left knee torn medial meniscus, osteoarthritis, insufficiency fracture of medial femoral condyle and medial tibial plateau  The various methods of treatment have been discussed with the patient and family. After consideration of risks, benefits and other options for treatment, the patient has consented to  Procedure(s): LEFT KNEE ARTHROSCOPY, DEBRIDEMENT, PARTIAL MEDIAL MENISCECTOMY, CHONDROPLASTY, ARTHROSCOPIC ASSISTED INTERNAL FIXATION OF MEDIAL FEMORAL CONDYLE AND MEDIAL TIBIAL PLATEAU INSUFFICIENCY FRACTURES (Left) as a surgical intervention .  The patient's history has been reviewed, patient examined, no change in status, stable for surgery.  I have reviewed the patient's chart and labs.  Questions were answered to the patient's satisfaction.     Lisvet Rasheed ANDREW

## 2017-01-11 ENCOUNTER — Encounter (HOSPITAL_BASED_OUTPATIENT_CLINIC_OR_DEPARTMENT_OTHER): Payer: Self-pay | Admitting: Specialist

## 2017-01-11 NOTE — Op Note (Signed)
NAME:  Deanna [name], Deanna [name]                      ACCOUNT NO.:  MEDICAL RECORD NO.:  [id number]  LOCATION:                                 FACILITY:  PHYSICIAN:  Cynda Familia, M.D. DATE OF BIRTH:  DATE OF PROCEDURE: DATE OF DISCHARGE:                              OPERATIVE REPORT   PREOPERATIVE DIAGNOSES:  Left knee torn medial meniscus, osteoarthritis with insufficiency fractures of medial femoral condyle and medial tibial plateau.  POSTOPERATIVE DIAGNOSES: 1. Left knee complex tear of medial meniscus. 2. 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 3. Insufficiency fracture of medial femoral condyle. 4. Insufficiency fracture of medial tibial plateau.  PROCEDURES: 1. Left knee arthroscopic partial medial meniscectomy. 2. Chondroplasty of medial compartment. 3. Arthroscopic-assisted internal fixation of medial femoral condyle     insufficiency fracture. 4. Arthroscopic-assisted internal fixation of medial tibial plateau     insufficiency fracture.  SURGEON:  Cynda Familia, M.D.  ASSISTANT:  Wyatt Portela, PA-C.  ANESTHESIA:  Adductor canal with general.  ESTIMATED BLOOD LOSS:  Less than 10 mL.  DRAINS:  None.  COMPLICATION:  None.  TOURNIQUET TIME:  50 minutes at 300 mmHg.  DISPOSITION:  PACU, stable.  OPERATIVE DETAILS:  The patient and family counseled in the holding area.  Correct site was identified, marked and signed appropriately.  IV was started.  Sedation was given, block was administered.  Correct site was marked and signed appropriately.  Taken to the operating room, placed and positioned under general anesthesia.  All extremities were well padded and bumped.  Left upper extremity was elevated, prepped with DuraPrep and draped in usual sterile fashion.  Time-out was done, confirmed the left side.  Exsanguinated with Esmarch.  Tourniquet was inflated to 300 mmHg due to the large size of thigh.  An arthroscopic portal  was established posteromedial, inferomedial, inferolateral. Diagnostic arthroscopy revealed normal patellofemoral tracking with very minimal chondromalacia.  Suture passed.  Midline gutters were unremarkable.  ACL and PCL were intact.  Lateral side inspected. Articular cartilage showed mild softening, but no significant chondromalacia.  Lateral meniscus showed fraying, but no frank tear. Medial side was inspected.  There was grade 3-4 chondromalacia of the medial femoral condyle.  Chondroplasty with mechanical shaver provided stable base.  There was also a large complex tear of medial meniscus posterior half, and basket and motorized shaver and the cautery system, a partial medial meniscectomy was done back to a stable rim.  C-arm was brought in.  Then, localized the bone marrow edema on the medial femoral condyle and medial tibial plateau.  Utilized C-arm corresponding with MRI scan views.  Placed the Biomet Zimmer trocar to the appropriate depth on both areas and exact location of bone marrow edema, insufficiency fractures.  I then properly tried the AccuFill to the tibia first followed by the femur, making sure do not over- pressurize this.  After the AccuFill had cured, I arthroscoped the also scoped the knee joint to make sure there was no extravasation and there was none.  After the AccuFill had cured, the trocar was removed.  We then used an AP and lateral C-arm shots to confirm excellent  position of the trocar and was also placement of the AccuFill.  We had no extravasation, the defects were well filled, but not excessively over- pressurize.  Portals were closed with 4-0 nylon suture, another 10 mL of Sensorcaine placed in the skin, 10 mL of Sensorcaine placed in the knee joint.  Sterile compressive dressing applied along with TED hose and ice pack, and no complications or problems.  She was awakened, taken from the operating room to the PACU in stable condition.  She will  be stabilized in the PACU and discharged to home.          ______________________________ Cynda Familia, M.D.     RAC/MEDQ  D:  01/10/2017  T:  01/11/2017  Job:  [id number]

## 2017-01-17 DIAGNOSIS — M25562 Pain in left knee: Secondary | ICD-10-CM | POA: Diagnosis not present

## 2017-01-17 DIAGNOSIS — Z4789 Encounter for other orthopedic aftercare: Secondary | ICD-10-CM | POA: Diagnosis not present

## 2017-01-17 DIAGNOSIS — S83207A Unspecified tear of unspecified meniscus, current injury, left knee, initial encounter: Secondary | ICD-10-CM | POA: Diagnosis not present

## 2017-01-17 DIAGNOSIS — M84469D Pathological fracture, unspecified tibia and fibula, subsequent encounter for fracture with routine healing: Secondary | ICD-10-CM | POA: Diagnosis not present

## 2017-01-21 DIAGNOSIS — M25562 Pain in left knee: Secondary | ICD-10-CM | POA: Diagnosis not present

## 2017-01-21 DIAGNOSIS — S83207A Unspecified tear of unspecified meniscus, current injury, left knee, initial encounter: Secondary | ICD-10-CM | POA: Diagnosis not present

## 2017-01-24 DIAGNOSIS — M25562 Pain in left knee: Secondary | ICD-10-CM | POA: Diagnosis not present

## 2017-01-24 DIAGNOSIS — S83207A Unspecified tear of unspecified meniscus, current injury, left knee, initial encounter: Secondary | ICD-10-CM | POA: Diagnosis not present

## 2017-01-28 DIAGNOSIS — M25562 Pain in left knee: Secondary | ICD-10-CM | POA: Diagnosis not present

## 2017-01-28 DIAGNOSIS — S83207A Unspecified tear of unspecified meniscus, current injury, left knee, initial encounter: Secondary | ICD-10-CM | POA: Diagnosis not present

## 2017-01-31 DIAGNOSIS — M25562 Pain in left knee: Secondary | ICD-10-CM | POA: Diagnosis not present

## 2017-01-31 DIAGNOSIS — S83207A Unspecified tear of unspecified meniscus, current injury, left knee, initial encounter: Secondary | ICD-10-CM | POA: Diagnosis not present

## 2017-02-04 DIAGNOSIS — S83207A Unspecified tear of unspecified meniscus, current injury, left knee, initial encounter: Secondary | ICD-10-CM | POA: Diagnosis not present

## 2017-02-04 DIAGNOSIS — M25562 Pain in left knee: Secondary | ICD-10-CM | POA: Diagnosis not present

## 2017-02-07 DIAGNOSIS — M25562 Pain in left knee: Secondary | ICD-10-CM | POA: Diagnosis not present

## 2017-02-07 DIAGNOSIS — S83207A Unspecified tear of unspecified meniscus, current injury, left knee, initial encounter: Secondary | ICD-10-CM | POA: Diagnosis not present

## 2017-02-11 DIAGNOSIS — Z23 Encounter for immunization: Secondary | ICD-10-CM | POA: Diagnosis not present

## 2017-02-11 DIAGNOSIS — S83207A Unspecified tear of unspecified meniscus, current injury, left knee, initial encounter: Secondary | ICD-10-CM | POA: Diagnosis not present

## 2017-02-11 DIAGNOSIS — M25562 Pain in left knee: Secondary | ICD-10-CM | POA: Diagnosis not present

## 2017-02-14 DIAGNOSIS — M25562 Pain in left knee: Secondary | ICD-10-CM | POA: Diagnosis not present

## 2017-02-14 DIAGNOSIS — S83207A Unspecified tear of unspecified meniscus, current injury, left knee, initial encounter: Secondary | ICD-10-CM | POA: Diagnosis not present

## 2017-02-18 DIAGNOSIS — M25562 Pain in left knee: Secondary | ICD-10-CM | POA: Diagnosis not present

## 2017-02-18 DIAGNOSIS — S83207A Unspecified tear of unspecified meniscus, current injury, left knee, initial encounter: Secondary | ICD-10-CM | POA: Diagnosis not present

## 2017-02-20 DIAGNOSIS — Z4789 Encounter for other orthopedic aftercare: Secondary | ICD-10-CM | POA: Diagnosis not present

## 2017-02-20 DIAGNOSIS — M84469D Pathological fracture, unspecified tibia and fibula, subsequent encounter for fracture with routine healing: Secondary | ICD-10-CM | POA: Diagnosis not present

## 2017-02-21 DIAGNOSIS — S83207A Unspecified tear of unspecified meniscus, current injury, left knee, initial encounter: Secondary | ICD-10-CM | POA: Diagnosis not present

## 2017-02-21 DIAGNOSIS — M25562 Pain in left knee: Secondary | ICD-10-CM | POA: Diagnosis not present

## 2017-02-25 ENCOUNTER — Other Ambulatory Visit: Payer: Self-pay | Admitting: Family Medicine

## 2017-02-25 DIAGNOSIS — Z1231 Encounter for screening mammogram for malignant neoplasm of breast: Secondary | ICD-10-CM

## 2017-02-25 DIAGNOSIS — S83207A Unspecified tear of unspecified meniscus, current injury, left knee, initial encounter: Secondary | ICD-10-CM | POA: Diagnosis not present

## 2017-02-25 DIAGNOSIS — M25562 Pain in left knee: Secondary | ICD-10-CM | POA: Diagnosis not present

## 2017-02-28 DIAGNOSIS — S83207A Unspecified tear of unspecified meniscus, current injury, left knee, initial encounter: Secondary | ICD-10-CM | POA: Diagnosis not present

## 2017-02-28 DIAGNOSIS — M25562 Pain in left knee: Secondary | ICD-10-CM | POA: Diagnosis not present

## 2017-03-14 DIAGNOSIS — J45909 Unspecified asthma, uncomplicated: Secondary | ICD-10-CM | POA: Diagnosis not present

## 2017-03-14 DIAGNOSIS — J309 Allergic rhinitis, unspecified: Secondary | ICD-10-CM | POA: Diagnosis not present

## 2017-03-22 ENCOUNTER — Other Ambulatory Visit: Payer: Self-pay | Admitting: Specialist

## 2017-03-22 ENCOUNTER — Ambulatory Visit
Admission: RE | Admit: 2017-03-22 | Discharge: 2017-03-22 | Disposition: A | Discharge status: Home / Self Care | Payer: Medicare Other | Source: Ambulatory Visit | Attending: Specialist | Admitting: Specialist

## 2017-03-22 DIAGNOSIS — M79661 Pain in right lower leg: Secondary | ICD-10-CM

## 2017-03-22 DIAGNOSIS — M7989 Other specified soft tissue disorders: Secondary | ICD-10-CM | POA: Diagnosis not present

## 2017-03-27 ENCOUNTER — Ambulatory Visit: Payer: Medicare Other

## 2017-04-03 DIAGNOSIS — M7989 Other specified soft tissue disorders: Secondary | ICD-10-CM | POA: Diagnosis not present

## 2017-04-03 DIAGNOSIS — Z4789 Encounter for other orthopedic aftercare: Secondary | ICD-10-CM | POA: Diagnosis not present

## 2017-04-03 DIAGNOSIS — M25562 Pain in left knee: Secondary | ICD-10-CM | POA: Diagnosis not present

## 2017-04-11 DIAGNOSIS — M7989 Other specified soft tissue disorders: Secondary | ICD-10-CM | POA: Diagnosis not present

## 2017-04-19 DIAGNOSIS — S83241A Other tear of medial meniscus, current injury, right knee, initial encounter: Secondary | ICD-10-CM | POA: Diagnosis not present

## 2017-04-19 DIAGNOSIS — M84361A Stress fracture, right tibia, initial encounter for fracture: Secondary | ICD-10-CM | POA: Diagnosis not present

## 2017-04-24 DIAGNOSIS — J209 Acute bronchitis, unspecified: Secondary | ICD-10-CM | POA: Diagnosis not present

## 2017-04-24 DIAGNOSIS — R030 Elevated blood-pressure reading, without diagnosis of hypertension: Secondary | ICD-10-CM | POA: Diagnosis not present

## 2017-04-26 ENCOUNTER — Ambulatory Visit
Admission: RE | Admit: 2017-04-26 | Discharge: 2017-04-26 | Disposition: A | Discharge status: Home / Self Care | Payer: Medicare Other | Source: Ambulatory Visit | Attending: Family Medicine | Admitting: Family Medicine

## 2017-04-26 DIAGNOSIS — Z1231 Encounter for screening mammogram for malignant neoplasm of breast: Secondary | ICD-10-CM | POA: Diagnosis not present

## 2017-05-02 ENCOUNTER — Other Ambulatory Visit: Payer: Self-pay | Admitting: Family Medicine

## 2017-05-02 ENCOUNTER — Ambulatory Visit
Admission: RE | Admit: 2017-05-02 | Discharge: 2017-05-02 | Disposition: A | Discharge status: Home / Self Care | Payer: Medicare Other | Source: Ambulatory Visit | Attending: Family Medicine | Admitting: Family Medicine

## 2017-05-02 DIAGNOSIS — R053 Chronic cough: Secondary | ICD-10-CM

## 2017-05-02 DIAGNOSIS — R05 Cough: Secondary | ICD-10-CM | POA: Diagnosis not present

## 2017-05-09 DIAGNOSIS — N898 Other specified noninflammatory disorders of vagina: Secondary | ICD-10-CM | POA: Diagnosis not present

## 2017-05-09 DIAGNOSIS — J209 Acute bronchitis, unspecified: Secondary | ICD-10-CM | POA: Diagnosis not present

## 2017-05-09 DIAGNOSIS — N390 Urinary tract infection, site not specified: Secondary | ICD-10-CM | POA: Diagnosis not present

## 2017-05-22 DIAGNOSIS — S83241D Other tear of medial meniscus, current injury, right knee, subsequent encounter: Secondary | ICD-10-CM | POA: Diagnosis not present

## 2017-05-29 ENCOUNTER — Ambulatory Visit: Payer: Self-pay | Admitting: Orthopedic Surgery

## 2017-06-25 ENCOUNTER — Encounter (HOSPITAL_BASED_OUTPATIENT_CLINIC_OR_DEPARTMENT_OTHER): Payer: Self-pay | Admitting: *Deleted

## 2017-06-25 ENCOUNTER — Other Ambulatory Visit: Payer: Self-pay

## 2017-06-25 NOTE — H&P (View-Only) (Signed)
Npo after midnight arrive 07-09-17 530 am [facility], take ranitidine sip of water in am, use albuterol inhaler prn and bring inhaler, spouse driver, chest xray 12-01-80 epic/chart. Patient instructed hibiclens shower am of surgery. Needs hemaglobin.

## 2017-06-25 NOTE — Progress Notes (Addendum)
Npo after midnight arrive 07-09-17 530 am [facility], take ranitidine sip of water in am, use albuterol inhaler prn and bring inhaler, spouse driver, chest xray 84-21-03 epic/chart. Patient instructed hibiclens shower am of surgery. Needs hemaglobin.

## 2017-07-09 ENCOUNTER — Other Ambulatory Visit: Payer: Self-pay

## 2017-07-09 ENCOUNTER — Encounter (HOSPITAL_BASED_OUTPATIENT_CLINIC_OR_DEPARTMENT_OTHER): Payer: Self-pay | Admitting: *Deleted

## 2017-07-09 ENCOUNTER — Ambulatory Visit (HOSPITAL_COMMUNITY): Payer: Medicare Other

## 2017-07-09 ENCOUNTER — Ambulatory Visit (HOSPITAL_BASED_OUTPATIENT_CLINIC_OR_DEPARTMENT_OTHER): Payer: Medicare Other | Admitting: Anesthesiology

## 2017-07-09 ENCOUNTER — Ambulatory Visit (HOSPITAL_BASED_OUTPATIENT_CLINIC_OR_DEPARTMENT_OTHER)
Admission: RE | Admit: 2017-07-09 | Discharge: 2017-07-09 | Disposition: A | Discharge status: Home / Self Care | Payer: Medicare Other | Source: Ambulatory Visit | Attending: Specialist | Admitting: Specialist

## 2017-07-09 ENCOUNTER — Encounter (HOSPITAL_BASED_OUTPATIENT_CLINIC_OR_DEPARTMENT_OTHER)
Admission: RE | Disposition: A | Discharge status: Home / Self Care | Payer: Self-pay | Source: Ambulatory Visit | Attending: Specialist

## 2017-07-09 DIAGNOSIS — S82141A Displaced bicondylar fracture of right tibia, initial encounter for closed fracture: Secondary | ICD-10-CM | POA: Diagnosis not present

## 2017-07-09 DIAGNOSIS — S83281A Other tear of lateral meniscus, current injury, right knee, initial encounter: Secondary | ICD-10-CM | POA: Diagnosis not present

## 2017-07-09 DIAGNOSIS — S82209A Unspecified fracture of shaft of unspecified tibia, initial encounter for closed fracture: Secondary | ICD-10-CM

## 2017-07-09 DIAGNOSIS — J45909 Unspecified asthma, uncomplicated: Secondary | ICD-10-CM | POA: Diagnosis not present

## 2017-07-09 DIAGNOSIS — S83231A Complex tear of medial meniscus, current injury, right knee, initial encounter: Secondary | ICD-10-CM | POA: Insufficient documentation

## 2017-07-09 DIAGNOSIS — Z79899 Other long term (current) drug therapy: Secondary | ICD-10-CM | POA: Insufficient documentation

## 2017-07-09 DIAGNOSIS — X58XXXA Exposure to other specified factors, initial encounter: Secondary | ICD-10-CM | POA: Diagnosis not present

## 2017-07-09 DIAGNOSIS — S83271A Complex tear of lateral meniscus, current injury, right knee, initial encounter: Secondary | ICD-10-CM | POA: Diagnosis not present

## 2017-07-09 DIAGNOSIS — Z9889 Other specified postprocedural states: Secondary | ICD-10-CM

## 2017-07-09 DIAGNOSIS — M84361A Stress fracture, right tibia, initial encounter for fracture: Secondary | ICD-10-CM | POA: Diagnosis not present

## 2017-07-09 DIAGNOSIS — Z7982 Long term (current) use of aspirin: Secondary | ICD-10-CM | POA: Insufficient documentation

## 2017-07-09 DIAGNOSIS — K219 Gastro-esophageal reflux disease without esophagitis: Secondary | ICD-10-CM | POA: Insufficient documentation

## 2017-07-09 DIAGNOSIS — M25561 Pain in right knee: Secondary | ICD-10-CM | POA: Diagnosis present

## 2017-07-09 DIAGNOSIS — Z6841 Body Mass Index (BMI) 40.0 and over, adult: Secondary | ICD-10-CM | POA: Insufficient documentation

## 2017-07-09 DIAGNOSIS — M84461A Pathological fracture, right tibia, initial encounter for fracture: Secondary | ICD-10-CM | POA: Insufficient documentation

## 2017-07-09 DIAGNOSIS — S83241A Other tear of medial meniscus, current injury, right knee, initial encounter: Secondary | ICD-10-CM | POA: Diagnosis not present

## 2017-07-09 DIAGNOSIS — M94261 Chondromalacia, right knee: Secondary | ICD-10-CM | POA: Diagnosis not present

## 2017-07-09 DIAGNOSIS — M1711 Unilateral primary osteoarthritis, right knee: Secondary | ICD-10-CM | POA: Diagnosis not present

## 2017-07-09 DIAGNOSIS — G8918 Other acute postprocedural pain: Secondary | ICD-10-CM | POA: Diagnosis not present

## 2017-07-09 HISTORY — DX: Unspecified asthma, uncomplicated: J45.909

## 2017-07-09 HISTORY — DX: Cardiac murmur, unspecified: R01.1

## 2017-07-09 HISTORY — PX: KNEE ARTHROSCOPY WITH MEDIAL MENISECTOMY: SHX5651

## 2017-07-09 HISTORY — PX: CHONDROPLASTY: SHX5177

## 2017-07-09 LAB — POCT HEMOGLOBIN-HEMACUE: HEMOGLOBIN: 14 g/dL (ref 12.0–15.0)

## 2017-07-09 SURGERY — ARTHROSCOPY, KNEE, WITH MEDIAL MENISCECTOMY
Anesthesia: General | Laterality: Right

## 2017-07-09 MED ORDER — OXYCODONE HCL 5 MG PO TABS
ORAL_TABLET | ORAL | Status: AC
  Filled 2017-07-09: qty 1

## 2017-07-09 MED ORDER — MIDAZOLAM HCL 2 MG/2ML IJ SOLN
INTRAMUSCULAR | Status: AC
  Filled 2017-07-09: qty 2

## 2017-07-09 MED ORDER — MORPHINE SULFATE (PF) 4 MG/ML IV SOLN
INTRAVENOUS | Status: DC | PRN
  Administered 2017-07-09: 4 mg via INTRAMUSCULAR

## 2017-07-09 MED ORDER — TRIAMCINOLONE ACETONIDE 40 MG/ML IJ SUSP
INTRAMUSCULAR | Status: DC | PRN
  Administered 2017-07-09: 40 mg via INTRA_ARTICULAR

## 2017-07-09 MED ORDER — CEFAZOLIN SODIUM-DEXTROSE 2-4 GM/100ML-% IV SOLN
INTRAVENOUS | Status: AC
  Filled 2017-07-09: qty 100

## 2017-07-09 MED ORDER — MIDAZOLAM HCL 2 MG/2ML IJ SOLN
1.0000 mg | INTRAMUSCULAR | Status: DC | PRN
  Administered 2017-07-09: 2 mg via INTRAVENOUS
  Filled 2017-07-09: qty 2

## 2017-07-09 MED ORDER — FENTANYL CITRATE (PF) 100 MCG/2ML IJ SOLN
25.0000 ug | INTRAMUSCULAR | Status: DC | PRN
  Filled 2017-07-09: qty 1

## 2017-07-09 MED ORDER — PROPOFOL 10 MG/ML IV BOLUS
INTRAVENOUS | Status: AC
  Filled 2017-07-09: qty 40

## 2017-07-09 MED ORDER — PROMETHAZINE HCL 25 MG/ML IJ SOLN
6.2500 mg | INTRAMUSCULAR | Status: DC | PRN
  Filled 2017-07-09: qty 1

## 2017-07-09 MED ORDER — BUPIVACAINE HCL 0.25 % IJ SOLN
INTRAMUSCULAR | Status: DC | PRN
  Administered 2017-07-09: 30 mL

## 2017-07-09 MED ORDER — SODIUM CHLORIDE 0.9 % IR SOLN
Status: DC | PRN
  Administered 2017-07-09 (×2): 3000 mL

## 2017-07-09 MED ORDER — ASPIRIN EC 325 MG PO TBEC: 325.0000 mg | DELAYED_RELEASE_TABLET | Freq: Two times a day (BID) | ORAL | 0 refills | Status: AC

## 2017-07-09 MED ORDER — MEPERIDINE HCL 25 MG/ML IJ SOLN
6.2500 mg | INTRAMUSCULAR | Status: DC | PRN
  Filled 2017-07-09: qty 1

## 2017-07-09 MED ORDER — PROPOFOL 10 MG/ML IV BOLUS
INTRAVENOUS | Status: DC | PRN
  Administered 2017-07-09: 50 mg via INTRAVENOUS
  Administered 2017-07-09: 200 mg via INTRAVENOUS

## 2017-07-09 MED ORDER — ROPIVACAINE HCL 5 MG/ML IJ SOLN
INTRAMUSCULAR | Status: DC | PRN
  Administered 2017-07-09: 20 mL via PERINEURAL

## 2017-07-09 MED ORDER — OXYCODONE HCL 5 MG PO TABS: 5.0000 mg | ORAL_TABLET | ORAL | 0 refills | Status: AC | PRN

## 2017-07-09 MED ORDER — HYDROMORPHONE HCL 1 MG/ML IJ SOLN
0.2500 mg | INTRAMUSCULAR | Status: DC | PRN
  Filled 2017-07-09: qty 0.5

## 2017-07-09 MED ORDER — FENTANYL CITRATE (PF) 100 MCG/2ML IJ SOLN
INTRAMUSCULAR | Status: DC | PRN
  Administered 2017-07-09 (×5): 25 ug via INTRAVENOUS
  Administered 2017-07-09: 50 ug via INTRAVENOUS
  Administered 2017-07-09: 25 ug via INTRAVENOUS

## 2017-07-09 MED ORDER — LIDOCAINE 2% (20 MG/ML) 5 ML SYRINGE
INTRAMUSCULAR | Status: AC
  Filled 2017-07-09: qty 5

## 2017-07-09 MED ORDER — DEXAMETHASONE SODIUM PHOSPHATE 10 MG/ML IJ SOLN
INTRAMUSCULAR | Status: AC
Start: 2017-07-09 — End: ?
  Filled 2017-07-09: qty 1

## 2017-07-09 MED ORDER — FENTANYL CITRATE (PF) 100 MCG/2ML IJ SOLN
INTRAMUSCULAR | Status: AC
  Filled 2017-07-09: qty 2

## 2017-07-09 MED ORDER — FENTANYL CITRATE (PF) 100 MCG/2ML IJ SOLN
100.0000 ug | Freq: Once | INTRAMUSCULAR | Status: AC
  Administered 2017-07-09: 100 ug via INTRAVENOUS
  Filled 2017-07-09: qty 2

## 2017-07-09 MED ORDER — ONDANSETRON HCL 4 MG/2ML IJ SOLN
INTRAMUSCULAR | Status: AC
  Filled 2017-07-09: qty 2

## 2017-07-09 MED ORDER — LIDOCAINE 2% (20 MG/ML) 5 ML SYRINGE
INTRAMUSCULAR | Status: DC | PRN
  Administered 2017-07-09: 100 mg via INTRAVENOUS

## 2017-07-09 MED ORDER — DEXAMETHASONE SODIUM PHOSPHATE 4 MG/ML IJ SOLN
INTRAMUSCULAR | Status: DC | PRN
  Administered 2017-07-09: 10 mg via INTRAVENOUS

## 2017-07-09 MED ORDER — OXYCODONE HCL 5 MG PO TABS
5.0000 mg | ORAL_TABLET | Freq: Once | ORAL | Status: AC | PRN
  Administered 2017-07-09: 5 mg via ORAL
  Filled 2017-07-09: qty 1

## 2017-07-09 MED ORDER — CEPHALEXIN 500 MG PO CAPS: 500.0000 mg | ORAL_CAPSULE | Freq: Three times a day (TID) | ORAL | 0 refills | Status: DC

## 2017-07-09 MED ORDER — LACTATED RINGERS IV SOLN
INTRAVENOUS | Status: DC
  Administered 2017-07-09 (×3): via INTRAVENOUS
  Filled 2017-07-09: qty 1000

## 2017-07-09 MED ORDER — OXYCODONE HCL 5 MG/5ML PO SOLN
5.0000 mg | Freq: Once | ORAL | Status: AC | PRN
  Filled 2017-07-09: qty 5

## 2017-07-09 MED ORDER — ONDANSETRON HCL 4 MG/2ML IJ SOLN
INTRAMUSCULAR | Status: DC | PRN
  Administered 2017-07-09: 4 mg via INTRAVENOUS

## 2017-07-09 MED ORDER — CHLORHEXIDINE GLUCONATE 4 % EX LIQD
60.0000 mL | Freq: Once | CUTANEOUS | Status: DC
  Filled 2017-07-09: qty 118

## 2017-07-09 MED ORDER — CEFAZOLIN SODIUM-DEXTROSE 2-4 GM/100ML-% IV SOLN
2.0000 g | INTRAVENOUS | Status: AC
  Administered 2017-07-09: 2 g via INTRAVENOUS
  Filled 2017-07-09: qty 100

## 2017-07-09 SURGICAL SUPPLY — 48 items
BANDAGE ESMARK 6X9 LF (GAUZE/BANDAGES/DRESSINGS) ×1 IMPLANT
BLADE CUDA GRT WHITE 3.5 (BLADE) ×2 IMPLANT
BNDG CMPR 9X6 STRL LF SNTH (GAUZE/BANDAGES/DRESSINGS) ×1
BNDG ESMARK 6X9 LF (GAUZE/BANDAGES/DRESSINGS) ×2
BNDG GAUZE ELAST 4 BULKY (GAUZE/BANDAGES/DRESSINGS) ×2 IMPLANT
CANISTER SUCTION 1200CC (MISCELLANEOUS) ×2 IMPLANT
CUFF TOURN SGL QUICK 24 (TOURNIQUET CUFF) ×2
CUFF TOURN SGL QUICK 34 (TOURNIQUET CUFF) ×2
CUFF TOURNIQUET SINGLE 34IN LL (TOURNIQUET CUFF) ×2 IMPLANT
CUFF TRNQT CYL 24X4X40X1 (TOURNIQUET CUFF) ×1 IMPLANT
CUFF TRNQT CYL 34X4X40X1 (TOURNIQUET CUFF) ×1 IMPLANT
DRAPE ARTHROSCOPY W/POUCH 114 (DRAPES) ×2 IMPLANT
DRAPE C-ARM 42X72 X-RAY (DRAPES) ×2 IMPLANT
DRAPE INCISE IOBAN 66X45 STRL (DRAPES) ×2 IMPLANT
DRAPE LG THREE QUARTER DISP (DRAPES) ×2 IMPLANT
DRAPE U-SHAPE 47X51 STRL (DRAPES) ×2 IMPLANT
DURAPREP 26ML APPLICATOR (WOUND CARE) ×2 IMPLANT
ELECT MENISCUS 165MM 90D (ELECTRODE) IMPLANT
GAUZE SPONGE 4X4 12PLY STRL (GAUZE/BANDAGES/DRESSINGS) ×2 IMPLANT
GAUZE XEROFORM 1X8 LF (GAUZE/BANDAGES/DRESSINGS) ×2 IMPLANT
GLOVE BIO SURGEON STRL SZ7.5 (GLOVE) ×2 IMPLANT
GLOVE BIO SURGEON STRL SZ8 (GLOVE) ×2 IMPLANT
GLOVE INDICATOR 8.0 STRL GRN (GLOVE) ×4 IMPLANT
GOWN STRL REUS W/TWL LRG LVL3 (GOWN DISPOSABLE) IMPLANT
GOWN STRL REUS W/TWL XL LVL3 (GOWN DISPOSABLE) ×4 IMPLANT
IMMOBILIZER KNEE 22 UNIV (SOFTGOODS) IMPLANT
IMMOBILIZER KNEE 24 THIGH 36 (MISCELLANEOUS) IMPLANT
IMMOBILIZER KNEE 24 UNIV (MISCELLANEOUS)
IV NS IRRIG 3000ML ARTHROMATIC (IV SOLUTION) ×4 IMPLANT
KIT MIXER ACCUMIX (KITS) ×1 IMPLANT
KIT TURNOVER CYSTO (KITS) ×2 IMPLANT
KNEE KIT SCP W/SIDE ACCUPORT (Joint) ×1 IMPLANT
KNEE WRAP E Z 3 GEL PACK (MISCELLANEOUS) ×2 IMPLANT
MANIFOLD NEPTUNE II (INSTRUMENTS) ×2 IMPLANT
NEEDLE HYPO 22GX1.5 SAFETY (NEEDLE) IMPLANT
PACK ARTHROSCOPY DSU (CUSTOM PROCEDURE TRAY) ×2 IMPLANT
PACK BASIN DAY SURGERY FS (CUSTOM PROCEDURE TRAY) ×2 IMPLANT
PAD ABD 8X10 STRL (GAUZE/BANDAGES/DRESSINGS) ×2 IMPLANT
PAD ARMBOARD 7.5X6 YLW CONV (MISCELLANEOUS) IMPLANT
PROBE BIPOLAR 50 DEGREE SUCT (MISCELLANEOUS) ×1 IMPLANT
PROBE BIPOLAR ATHRO 135MM 90D (MISCELLANEOUS) IMPLANT
SET ARTHROSCOPY TUBING (MISCELLANEOUS) ×2
SET ARTHROSCOPY TUBING LN (MISCELLANEOUS) ×1 IMPLANT
SUT ETHILON 4 0 PS 2 18 (SUTURE) ×2 IMPLANT
SYR CONTROL 10ML LL (SYRINGE) ×2 IMPLANT
TOWEL OR 17X24 6PK STRL BLUE (TOWEL DISPOSABLE) ×2 IMPLANT
TUBE CONNECTING 12X1/4 (SUCTIONS) ×2 IMPLANT
WATER STERILE IRR 500ML POUR (IV SOLUTION) ×2 IMPLANT

## 2017-07-09 NOTE — Op Note (Signed)
[id number]

## 2017-07-09 NOTE — Discharge Instructions (Signed)

## 2017-07-09 NOTE — Anesthesia Procedure Notes (Signed)
Anesthesia Regional Block: Adductor canal block   Pre-Anesthetic Checklist: ,, timeout performed, Correct Patient, Correct Site, Correct Laterality, Correct Procedure, Correct Position, site marked, Risks and benefits discussed,  Surgical consent,  Pre-op evaluation,  At surgeon's request and post-op pain management  Laterality: Right  Prep: chloraprep       Needles:  Injection technique: Single-shot  Needle Type: Stimiplex     Needle Length: 9cm  Needle Gauge: 21     Additional Needles:   Procedures:,,,, ultrasound used (permanent image in chart),,,,  Narrative:  Start time: 07/09/2017 7:59 AM End time: 07/09/2017 8:04 AM Injection made incrementally with aspirations every 5 mL.  Performed by: Personally  Anesthesiologist: Lynda Rainwater, MD

## 2017-07-09 NOTE — Anesthesia Procedure Notes (Signed)
Procedure Name: LMA Insertion Date/Time: 07/09/2017 8:17 AM Performed by: Justice Rocher, CRNA Pre-anesthesia Checklist: Patient identified, Emergency Drugs available, Suction available and Patient being monitored Patient Re-evaluated:Patient Re-evaluated prior to induction Oxygen Delivery Method: Circle system utilized Preoxygenation: Pre-oxygenation with 100% oxygen Induction Type: IV induction Ventilation: Mask ventilation without difficulty LMA: LMA inserted LMA Size: 4.0 Number of attempts: 1 Airway Equipment and Method: Bite block Placement Confirmation: positive ETCO2 and breath sounds checked- equal and bilateral Tube secured with: Tape Dental Injury: Teeth and Oropharynx as per pre-operative assessment

## 2017-07-09 NOTE — Transfer of Care (Signed)
Immediate Anesthesia Transfer of Care Note  Patient: Deanna [name]  Procedure(s) Performed: Procedure(s) (LRB): Right knee scope, debridement, chondroplasty, partial meniscectomy, internal arthroscopic assisted internal fixation of medial tibial plateau fracture (Right) CHONDROPLASTY (Right)  Patient Location: PACU  Anesthesia Type: General  Level of Consciousness: awake, sedated, patient cooperative and responds to stimulation  Airway & Oxygen Therapy: Patient Spontanous Breathing and Patient connected to NCO2  Post-op Assessment: Report given to PACU RN, Post -op Vital signs reviewed and stable and Patient moving all extremities  Post vital signs: Reviewed and stable  Complications: No apparent anesthesia complications

## 2017-07-09 NOTE — Anesthesia Preprocedure Evaluation (Signed)
Anesthesia Evaluation  Patient identified by MRN, date of birth, ID band Patient awake    Reviewed: Allergy & Precautions, NPO status , Patient's Chart, lab work & pertinent test results  Airway Mallampati: II  TM Distance: >3 FB Neck ROM: Full    Dental  (+) Teeth Intact, Caps,    Pulmonary    Pulmonary exam normal breath sounds clear to auscultation       Cardiovascular  Rhythm:Regular Rate:Normal     Neuro/Psych    GI/Hepatic GERD  ,  Endo/Other  Morbid obesity  Renal/GU      Musculoskeletal  (+) Arthritis ,   Abdominal (+) + obese,   Peds  Hematology   Anesthesia Other Findings   Reproductive/Obstetrics                             Anesthesia Physical  Anesthesia Plan  ASA: III  Anesthesia Plan: General   Post-op Pain Management:  Regional for Post-op pain   Induction:   PONV Risk Score and Plan: 3 and Ondansetron, Dexamethasone and Midazolam  Airway Management Planned: LMA  Additional Equipment:   Intra-op Plan:   Post-operative Plan: Extubation in OR  Informed Consent:   Plan Discussed with:   Anesthesia Plan Comments:         Anesthesia Quick Evaluation

## 2017-07-09 NOTE — Op Note (Signed)
[id number]

## 2017-07-09 NOTE — Interval H&P Note (Signed)
History and Physical Interval Note:  07/09/2017 7:41 AM  Deanna [name]  has presented today for surgery, with the diagnosis of right knee medial meniscal tear  The various methods of treatment have been discussed with the patient and family. After consideration of risks, benefits and other options for treatment, the patient has consented to  Procedure(s): Right knee scope, debridement, chondroplasty, partial meniscectomy, internal arthroscopic assisted internal fixation of medial tibial plateau fracture (Right) CHONDROPLASTY (Right) as a surgical intervention .  The patient's history has been reviewed, patient examined, no change in status, stable for surgery.  I have reviewed the patient's chart and labs.  Questions were answered to the patient's satisfaction.     Ravenna Legore ANDREW

## 2017-07-09 NOTE — H&P (Signed)
Deanna [name] is an 67 y.o. female.   Chief Complaint: right knee pain HPI: Patient presents with joint discomfort that had been persistent for several weeks now. Despite conservative treatments, the patients discomfort has not improved. Imaging was obtained. Other conservative and surgical treatments were discussed in detail. Patient wishes to proceed with surgery as consented. Denies SOB, CP, or calf pain. No Fever, chills, or nausea/ vomiting.   Past Medical History:  Diagnosis Date  . Arthritis    osteoarthritis in  both knees  . Asthma   . Difficult intravenous access   . GERD (gastroesophageal reflux disease)   . Heart murmur    born with years ago  . Pre-diabetes    last A1C=6.0 per pt  . Torn meniscus    left knee w fracture x2 to left knee  . Wears glasses     Past Surgical History:  Procedure Laterality Date  . CHOLECYSTECTOMY    . HERNIA REPAIR Right age 78   inguinal  . KNEE ARTHROSCOPY WITH MEDIAL MENISECTOMY Left 01/10/2017   Procedure: LEFT KNEE ARTHROSCOPY, DEBRIDEMENT, PARTIAL MEDIAL MENISCECTOMY, CHONDROPLASTY, ARTHROSCOPIC ASSISTED INTERNAL FIXATION OF MEDIAL FEMORAL CONDYLE AND MEDIAL TIBIAL PLATEAU INSUFFICIENCY FRACTURES;  Surgeon: Sydnee Cabal, MD;  Location: [place];  Service: Orthopedics;  Laterality: Left;  . TUBAL LIGATION  age 66    History reviewed. No pertinent family history. Social History:  reports that  has never smoked. she has never used smokeless tobacco. She reports that she does not drink alcohol or use drugs.  Allergies: No Known Allergies  Medications Prior to Admission  Medication Sig Dispense Refill  . albuterol (PROVENTIL HFA;VENTOLIN HFA) 108 (90 Base) MCG/ACT inhaler Inhale 2 puffs into the lungs every 4 (four) hours as needed for wheezing or shortness of breath.    Marland Kitchen aspirin EC 81 MG tablet Take 81 mg by mouth daily.    . calcium-vitamin D (OSCAL WITH D) 500-200 MG-UNIT tablet Take 1 tablet by mouth daily with  breakfast.    . diclofenac sodium (VOLTAREN) 1 % GEL Apply topically as needed.    . Omega-3 Fatty Acids (OMEGA-3 FISH OIL PO) Take by mouth. 1600 mg q day    . ranitidine (ZANTAC) 150 MG tablet Take 150 mg by mouth daily. OTC    . UNABLE TO FIND womens plus multivitamin      Results for orders placed or performed during the hospital encounter of 07/09/17 (from the past 48 hour(s))  Hemoglobin-hemacue, POC     Status: None   Collection Time: 07/09/17  6:16 AM  Result Value Ref Range   Hemoglobin 14.0 12.0 - 15.0 g/dL   No results found.  Review of Systems  Constitutional: Negative.   HENT: Negative.   Eyes: Negative.   Respiratory: Negative.   Cardiovascular: Negative.   Gastrointestinal: Positive for nausea.  Genitourinary: Negative.   Musculoskeletal: Positive for joint pain.  Skin: Negative.   Neurological: Negative.   Endo/Heme/Allergies: Negative.   Psychiatric/Behavioral: The patient is nervous/anxious.     Blood pressure (!) 145/65, pulse 88, temperature 98.4 F (36.9 C), temperature source Oral, resp. rate 18, height 5\' 1"  (1.549 m), weight 110.2 kg (242 lb 14.4 oz), SpO2 96 %. Physical Exam  Constitutional: She is oriented to person, place, and time. She appears well-developed.  HENT:  Head: Normocephalic.  Eyes: EOM are normal.  Neck: Normal range of motion.  Cardiovascular: Normal rate and intact distal pulses.  Respiratory: Effort normal.  GI: Soft.  Genitourinary:  Genitourinary Comments: Deferred  Musculoskeletal:  Right knee pain. Fair ROM. Limited strength.   Neurological: She is alert and oriented to person, place, and time.  Skin: Skin is warm and dry.  Psychiatric: Her behavior is normal.     Assessment/Plan Right knee OA,TM, IF Right knee scope as consented D/c home with family Follow instructions ASA at d/c Take medications as directed F/u in office in 7 days and PRN Denisse Whitenack L, PA-C 07/09/2017, 7:28 AM

## 2017-07-09 NOTE — Anesthesia Postprocedure Evaluation (Signed)
Anesthesia Post Note  Patient: Nyhla Mountjoy  Procedure(s) Performed: Right knee scope, debridement, chondroplasty, partial meniscectomy, internal arthroscopic assisted internal fixation of medial tibial plateau fracture (Right ) CHONDROPLASTY (Right )     Patient location during evaluation: PACU Anesthesia Type: General Level of consciousness: awake and alert Pain management: pain level controlled Vital Signs Assessment: post-procedure vital signs reviewed and stable Respiratory status: spontaneous breathing, nonlabored ventilation and respiratory function stable Cardiovascular status: blood pressure returned to baseline and stable Postop Assessment: no apparent nausea or vomiting Anesthetic complications: no    Last Vitals:  Vitals:   07/09/17 0945 07/09/17 1000  BP: (!) 164/89 (!) 156/78  Pulse: 99 97  Resp: 13 16  Temp:    SpO2: 90% 91%    Last Pain:  Vitals:   07/09/17 1020  TempSrc:   PainSc: 5         RLE Motor Response: Purposeful movement (07/09/17 1020) RLE Sensation: Full sensation (07/09/17 1020)      Lynda Rainwater

## 2017-07-10 ENCOUNTER — Encounter (HOSPITAL_BASED_OUTPATIENT_CLINIC_OR_DEPARTMENT_OTHER): Payer: Self-pay | Admitting: Specialist

## 2017-07-10 NOTE — Op Note (Signed)
NAME:  Deanna [name], Deanna [name]                      ACCOUNT NO.:  MEDICAL RECORD NO.:  [id number]  LOCATION:                                 FACILITY:  PHYSICIAN:  Cynda Familia, M.D. DATE OF BIRTH:  DATE OF PROCEDURE: DATE OF DISCHARGE:                              OPERATIVE REPORT   PREOPERATIVE DIAGNOSES: 1. Complex tear of medial meniscus. 2. Osteoarthritis. 3. Insufficiency fracture, medial tibial plateau with medial tibial     plateau bone marrow edema.  POSTOPERATIVE DIAGNOSES: 1. Complex tear of medial meniscus. 2. Osteoarthritis. 3. Insufficiency fracture, medial tibial plateau with medial tibial     plateau bone marrow edema. 4. Complex tear of lateral meniscus. 5. Chondromalacia of grade 2, lateral femoral condyle.  PROCEDURE: 1. Partial medial meniscectomy. 2. Partial lateral meniscectomy. 3. Chondroplasty of medial femoral condyle and lateral femoral     condyle. 4. Arthroscopic assisted internal fixation of medial tibial plateau     insufficiency fracture.  SURGEON:  Cynda Familia, M.D.  ASSISTANT:  Wyatt Portela, PA-C.  ANESTHESIA:  General.  ESTIMATED BLOOD LOSS:  Minimal.  DRAINS:  None.  COMPLICATIONS:  None.  TOURNIQUET TIME:  35 minutes at 300 mmHg.  DISPOSITION:  PACU, stable.  OPERATIVE DETAILS:  The patient and family counseled in the holding area.  Correct site was identified.  IV was started.  IV antibiotics were given.  Taken to the operating room, placed in supine position under general anesthesia.  PAS stockings were applied to uninvolved leg. Right lower extremity was elevated, prepped with DuraPrep and draped into a sterile fashion.  Time-out was done and confirmed the right side. Exsanguinated with an Esmarch.  Tourniquet was inflated to 300 mmHg. Arthroscopic portal was established; proximal medial, inferomedial, inferolateral.  Diagnostic arthroscopy of the knee joint revealed some grade 2 chondromalacia of the  patella, did not require chondroplasty, femoral trochlea unremarkable.  ACL and PCL were intact.  Medial side was inspected.  Complex tear of the posterior one-third medial meniscus, utilizing basket and motorized shaver and a cautery.  Partial medial meniscectomy was performed back to a stable base, properly beveled, and contoured.  Medial femoral condyle showed grade 3 chondromalacia and chondral flaps, debrided with motorized shaver back to the stable base. Lateral side was inspected.  Lateral femoral condyle showed grade 2 chondromalacia to lateral tibial plateau, light chondroplasty with motorized shaver.  The anterior half of the lateral meniscus utilizing basket and motorized shaver and a cautery, partial lateral meniscectomy was performed back to a stable base, properly beveled, and contoured. At this point in time, the C-arm was brought in.  The medial tibial plateau insufficiency fracture was identified, and localized in AP and lateral planes.  Small puncture was made laterally.  The Biomet Zimmer trocar was placed into the defect, coordination of the MRI scan.  At this time, I sequentially filled with 4 cc of AccuFill.  This was done to the correct pressure given large anterior, posterior, superior and inferior.  Scope was placed back in the knee joint.  There was no extravasation, AccuFill into the soft tissues or the knee joint.  Area was irrigated and  arthroscopic equipment was removed.  After appropriate amount of time, the trocar was removed.  Final C-arm spots were taken, no complications or problems.  Portal was closed with 4-0 Nylon suture. Another 10 cc of Sensorcaine placed in the skin and knee joint.  Sterile compressive dressing applied, TED hose and ice pack.  No complication. She was awakened, taken from the operating room to PACU in stable condition.  She will be stabilized in PACU and discharged to home.  Seen back in the office next week, start on therapy.   Prognosis is excellent.          ______________________________ Cynda Familia, M.D.     RAC/MEDQ  D:  07/09/2017  T:  07/09/2017  Job:  [id number]  cc:   Cynda Familia, M.D. Fax: [phone]

## 2017-07-17 DIAGNOSIS — Z4789 Encounter for other orthopedic aftercare: Secondary | ICD-10-CM | POA: Diagnosis not present

## 2017-07-22 DIAGNOSIS — Z4789 Encounter for other orthopedic aftercare: Secondary | ICD-10-CM | POA: Diagnosis not present

## 2017-07-24 DIAGNOSIS — Z4789 Encounter for other orthopedic aftercare: Secondary | ICD-10-CM | POA: Diagnosis not present

## 2017-07-29 DIAGNOSIS — Z4789 Encounter for other orthopedic aftercare: Secondary | ICD-10-CM | POA: Diagnosis not present

## 2017-08-01 DIAGNOSIS — Z4789 Encounter for other orthopedic aftercare: Secondary | ICD-10-CM | POA: Diagnosis not present

## 2017-08-05 DIAGNOSIS — Z4789 Encounter for other orthopedic aftercare: Secondary | ICD-10-CM | POA: Diagnosis not present

## 2017-08-08 DIAGNOSIS — Z4789 Encounter for other orthopedic aftercare: Secondary | ICD-10-CM | POA: Diagnosis not present

## 2017-08-12 DIAGNOSIS — Z4789 Encounter for other orthopedic aftercare: Secondary | ICD-10-CM | POA: Diagnosis not present

## 2017-08-19 DIAGNOSIS — Z4789 Encounter for other orthopedic aftercare: Secondary | ICD-10-CM | POA: Diagnosis not present

## 2017-08-26 DIAGNOSIS — Z4789 Encounter for other orthopedic aftercare: Secondary | ICD-10-CM | POA: Diagnosis not present

## 2017-09-03 DIAGNOSIS — Z4789 Encounter for other orthopedic aftercare: Secondary | ICD-10-CM | POA: Diagnosis not present

## 2017-09-09 DIAGNOSIS — Z4789 Encounter for other orthopedic aftercare: Secondary | ICD-10-CM | POA: Diagnosis not present

## 2017-09-16 DIAGNOSIS — Z4789 Encounter for other orthopedic aftercare: Secondary | ICD-10-CM | POA: Diagnosis not present

## 2017-09-23 DIAGNOSIS — Z4789 Encounter for other orthopedic aftercare: Secondary | ICD-10-CM | POA: Diagnosis not present

## 2017-09-27 DIAGNOSIS — M1711 Unilateral primary osteoarthritis, right knee: Secondary | ICD-10-CM | POA: Diagnosis not present

## 2017-09-27 DIAGNOSIS — M25561 Pain in right knee: Secondary | ICD-10-CM | POA: Diagnosis not present

## 2017-10-16 DIAGNOSIS — Z1283 Encounter for screening for malignant neoplasm of skin: Secondary | ICD-10-CM | POA: Diagnosis not present

## 2017-10-16 DIAGNOSIS — D225 Melanocytic nevi of trunk: Secondary | ICD-10-CM | POA: Diagnosis not present

## 2017-10-24 DIAGNOSIS — Z0000 Encounter for general adult medical examination without abnormal findings: Secondary | ICD-10-CM | POA: Diagnosis not present

## 2017-10-24 DIAGNOSIS — R7303 Prediabetes: Secondary | ICD-10-CM | POA: Diagnosis not present

## 2017-10-24 DIAGNOSIS — K219 Gastro-esophageal reflux disease without esophagitis: Secondary | ICD-10-CM | POA: Diagnosis not present

## 2017-10-24 DIAGNOSIS — Z1159 Encounter for screening for other viral diseases: Secondary | ICD-10-CM | POA: Diagnosis not present

## 2017-10-25 DIAGNOSIS — M1711 Unilateral primary osteoarthritis, right knee: Secondary | ICD-10-CM | POA: Diagnosis not present

## 2017-11-01 DIAGNOSIS — M1711 Unilateral primary osteoarthritis, right knee: Secondary | ICD-10-CM | POA: Diagnosis not present

## 2017-11-06 DIAGNOSIS — M170 Bilateral primary osteoarthritis of knee: Secondary | ICD-10-CM | POA: Diagnosis not present

## 2017-11-06 DIAGNOSIS — M1712 Unilateral primary osteoarthritis, left knee: Secondary | ICD-10-CM | POA: Diagnosis not present

## 2017-12-13 DIAGNOSIS — M1712 Unilateral primary osteoarthritis, left knee: Secondary | ICD-10-CM | POA: Diagnosis not present

## 2017-12-18 DIAGNOSIS — M1712 Unilateral primary osteoarthritis, left knee: Secondary | ICD-10-CM | POA: Diagnosis not present

## 2017-12-24 ENCOUNTER — Other Ambulatory Visit: Payer: Self-pay | Admitting: Family Medicine

## 2017-12-24 DIAGNOSIS — Z1231 Encounter for screening mammogram for malignant neoplasm of breast: Secondary | ICD-10-CM

## 2017-12-25 DIAGNOSIS — M1712 Unilateral primary osteoarthritis, left knee: Secondary | ICD-10-CM | POA: Diagnosis not present

## 2018-02-06 DIAGNOSIS — Z23 Encounter for immunization: Secondary | ICD-10-CM | POA: Diagnosis not present

## 2018-02-12 DIAGNOSIS — M1712 Unilateral primary osteoarthritis, left knee: Secondary | ICD-10-CM | POA: Diagnosis not present

## 2018-02-12 DIAGNOSIS — M1711 Unilateral primary osteoarthritis, right knee: Secondary | ICD-10-CM | POA: Diagnosis not present

## 2018-02-13 DIAGNOSIS — R0602 Shortness of breath: Secondary | ICD-10-CM | POA: Diagnosis not present

## 2018-02-13 DIAGNOSIS — K219 Gastro-esophageal reflux disease without esophagitis: Secondary | ICD-10-CM | POA: Diagnosis not present

## 2018-02-13 DIAGNOSIS — R609 Edema, unspecified: Secondary | ICD-10-CM | POA: Diagnosis not present

## 2018-02-14 ENCOUNTER — Other Ambulatory Visit (HOSPITAL_COMMUNITY): Payer: Self-pay | Admitting: Family Medicine

## 2018-02-14 DIAGNOSIS — R0602 Shortness of breath: Secondary | ICD-10-CM

## 2018-02-17 ENCOUNTER — Ambulatory Visit (HOSPITAL_COMMUNITY): Payer: Medicare Other | Attending: Cardiovascular Disease

## 2018-02-17 ENCOUNTER — Other Ambulatory Visit: Payer: Self-pay

## 2018-02-17 DIAGNOSIS — R011 Cardiac murmur, unspecified: Secondary | ICD-10-CM | POA: Insufficient documentation

## 2018-02-17 DIAGNOSIS — R0602 Shortness of breath: Secondary | ICD-10-CM | POA: Diagnosis not present

## 2018-02-17 DIAGNOSIS — J45909 Unspecified asthma, uncomplicated: Secondary | ICD-10-CM | POA: Insufficient documentation

## 2018-02-17 DIAGNOSIS — E669 Obesity, unspecified: Secondary | ICD-10-CM | POA: Diagnosis not present

## 2018-02-17 DIAGNOSIS — I340 Nonrheumatic mitral (valve) insufficiency: Secondary | ICD-10-CM | POA: Insufficient documentation

## 2018-02-17 DIAGNOSIS — Z6841 Body Mass Index (BMI) 40.0 and over, adult: Secondary | ICD-10-CM | POA: Insufficient documentation

## 2018-03-27 DIAGNOSIS — I519 Heart disease, unspecified: Secondary | ICD-10-CM | POA: Diagnosis not present

## 2018-03-27 DIAGNOSIS — K219 Gastro-esophageal reflux disease without esophagitis: Secondary | ICD-10-CM | POA: Diagnosis not present

## 2018-03-27 DIAGNOSIS — R609 Edema, unspecified: Secondary | ICD-10-CM | POA: Diagnosis not present

## 2018-03-28 DIAGNOSIS — H04123 Dry eye syndrome of bilateral lacrimal glands: Secondary | ICD-10-CM | POA: Diagnosis not present

## 2018-03-28 DIAGNOSIS — H2513 Age-related nuclear cataract, bilateral: Secondary | ICD-10-CM | POA: Diagnosis not present

## 2018-03-28 DIAGNOSIS — E119 Type 2 diabetes mellitus without complications: Secondary | ICD-10-CM | POA: Diagnosis not present

## 2018-03-28 DIAGNOSIS — H5203 Hypermetropia, bilateral: Secondary | ICD-10-CM | POA: Diagnosis not present

## 2018-04-30 ENCOUNTER — Ambulatory Visit
Admission: RE | Admit: 2018-04-30 | Discharge: 2018-04-30 | Disposition: A | Discharge status: Home / Self Care | Payer: Medicare Other | Source: Ambulatory Visit | Attending: Family Medicine | Admitting: Family Medicine

## 2018-04-30 DIAGNOSIS — Z1231 Encounter for screening mammogram for malignant neoplasm of breast: Secondary | ICD-10-CM | POA: Diagnosis not present

## 2018-04-30 DIAGNOSIS — R7303 Prediabetes: Secondary | ICD-10-CM | POA: Diagnosis not present

## 2018-04-30 DIAGNOSIS — K219 Gastro-esophageal reflux disease without esophagitis: Secondary | ICD-10-CM | POA: Diagnosis not present

## 2018-10-27 DIAGNOSIS — Z136 Encounter for screening for cardiovascular disorders: Secondary | ICD-10-CM | POA: Diagnosis not present

## 2018-10-27 DIAGNOSIS — R7303 Prediabetes: Secondary | ICD-10-CM | POA: Diagnosis not present

## 2018-10-27 DIAGNOSIS — Z79899 Other long term (current) drug therapy: Secondary | ICD-10-CM | POA: Diagnosis not present

## 2018-10-29 DIAGNOSIS — J45909 Unspecified asthma, uncomplicated: Secondary | ICD-10-CM | POA: Diagnosis not present

## 2018-10-29 DIAGNOSIS — K219 Gastro-esophageal reflux disease without esophagitis: Secondary | ICD-10-CM | POA: Diagnosis not present

## 2018-10-29 DIAGNOSIS — Z1211 Encounter for screening for malignant neoplasm of colon: Secondary | ICD-10-CM | POA: Diagnosis not present

## 2018-10-29 DIAGNOSIS — R7303 Prediabetes: Secondary | ICD-10-CM | POA: Diagnosis not present

## 2018-10-29 DIAGNOSIS — M1712 Unilateral primary osteoarthritis, left knee: Secondary | ICD-10-CM | POA: Diagnosis not present

## 2018-10-29 DIAGNOSIS — Z0000 Encounter for general adult medical examination without abnormal findings: Secondary | ICD-10-CM | POA: Diagnosis not present

## 2018-11-20 ENCOUNTER — Other Ambulatory Visit: Payer: Self-pay | Admitting: Family Medicine

## 2018-11-20 DIAGNOSIS — Z1231 Encounter for screening mammogram for malignant neoplasm of breast: Secondary | ICD-10-CM

## 2018-11-29 ENCOUNTER — Emergency Department (HOSPITAL_COMMUNITY): Payer: Medicare Other

## 2018-11-29 ENCOUNTER — Encounter (HOSPITAL_COMMUNITY): Payer: Self-pay | Admitting: Emergency Medicine

## 2018-11-29 ENCOUNTER — Inpatient Hospital Stay (HOSPITAL_COMMUNITY): Payer: Medicare Other | Admitting: Certified Registered"

## 2018-11-29 ENCOUNTER — Encounter (HOSPITAL_COMMUNITY)
Admission: EM | Disposition: A | Discharge status: Rehabilitation Facility | Payer: Self-pay | Source: Home / Self Care | Attending: Neurology

## 2018-11-29 ENCOUNTER — Inpatient Hospital Stay (HOSPITAL_COMMUNITY): Payer: Medicare Other

## 2018-11-29 ENCOUNTER — Inpatient Hospital Stay (HOSPITAL_COMMUNITY)
Admission: EM | Admit: 2018-11-29 | Discharge: 2018-12-02 | DRG: 023 | Disposition: A | Discharge status: Rehabilitation Facility | Payer: Medicare Other | Attending: Neurology | Admitting: Neurology

## 2018-11-29 DIAGNOSIS — R29810 Facial weakness: Secondary | ICD-10-CM | POA: Diagnosis present

## 2018-11-29 DIAGNOSIS — G8194 Hemiplegia, unspecified affecting left nondominant side: Secondary | ICD-10-CM | POA: Diagnosis present

## 2018-11-29 DIAGNOSIS — R1310 Dysphagia, unspecified: Secondary | ICD-10-CM | POA: Diagnosis present

## 2018-11-29 DIAGNOSIS — R7303 Prediabetes: Secondary | ICD-10-CM | POA: Diagnosis present

## 2018-11-29 DIAGNOSIS — R471 Dysarthria and anarthria: Secondary | ICD-10-CM | POA: Diagnosis present

## 2018-11-29 DIAGNOSIS — N319 Neuromuscular dysfunction of bladder, unspecified: Secondary | ICD-10-CM | POA: Diagnosis not present

## 2018-11-29 DIAGNOSIS — R29707 NIHSS score 7: Secondary | ICD-10-CM | POA: Diagnosis present

## 2018-11-29 DIAGNOSIS — R339 Retention of urine, unspecified: Secondary | ICD-10-CM | POA: Diagnosis not present

## 2018-11-29 DIAGNOSIS — Z20828 Contact with and (suspected) exposure to other viral communicable diseases: Secondary | ICD-10-CM | POA: Diagnosis not present

## 2018-11-29 DIAGNOSIS — K219 Gastro-esophageal reflux disease without esophagitis: Secondary | ICD-10-CM | POA: Diagnosis present

## 2018-11-29 DIAGNOSIS — Z1159 Encounter for screening for other viral diseases: Secondary | ICD-10-CM

## 2018-11-29 DIAGNOSIS — Z9851 Tubal ligation status: Secondary | ICD-10-CM

## 2018-11-29 DIAGNOSIS — H53462 Homonymous bilateral field defects, left side: Secondary | ICD-10-CM | POA: Diagnosis present

## 2018-11-29 DIAGNOSIS — M549 Dorsalgia, unspecified: Secondary | ICD-10-CM | POA: Diagnosis present

## 2018-11-29 DIAGNOSIS — I69354 Hemiplegia and hemiparesis following cerebral infarction affecting left non-dominant side: Secondary | ICD-10-CM | POA: Diagnosis present

## 2018-11-29 DIAGNOSIS — I6601 Occlusion and stenosis of right middle cerebral artery: Secondary | ICD-10-CM | POA: Diagnosis not present

## 2018-11-29 DIAGNOSIS — J45909 Unspecified asthma, uncomplicated: Secondary | ICD-10-CM | POA: Diagnosis present

## 2018-11-29 DIAGNOSIS — R0902 Hypoxemia: Secondary | ICD-10-CM | POA: Diagnosis not present

## 2018-11-29 DIAGNOSIS — H547 Unspecified visual loss: Secondary | ICD-10-CM | POA: Diagnosis not present

## 2018-11-29 DIAGNOSIS — I639 Cerebral infarction, unspecified: Secondary | ICD-10-CM | POA: Diagnosis present

## 2018-11-29 DIAGNOSIS — M170 Bilateral primary osteoarthritis of knee: Secondary | ICD-10-CM | POA: Diagnosis present

## 2018-11-29 DIAGNOSIS — I619 Nontraumatic intracerebral hemorrhage, unspecified: Secondary | ICD-10-CM | POA: Diagnosis not present

## 2018-11-29 DIAGNOSIS — E785 Hyperlipidemia, unspecified: Secondary | ICD-10-CM | POA: Diagnosis present

## 2018-11-29 DIAGNOSIS — G8929 Other chronic pain: Secondary | ICD-10-CM | POA: Diagnosis present

## 2018-11-29 DIAGNOSIS — I69391 Dysphagia following cerebral infarction: Secondary | ICD-10-CM | POA: Diagnosis not present

## 2018-11-29 DIAGNOSIS — R4182 Altered mental status, unspecified: Secondary | ICD-10-CM | POA: Diagnosis not present

## 2018-11-29 DIAGNOSIS — Z823 Family history of stroke: Secondary | ICD-10-CM

## 2018-11-29 DIAGNOSIS — B964 Proteus (mirabilis) (morganii) as the cause of diseases classified elsewhere: Secondary | ICD-10-CM | POA: Diagnosis not present

## 2018-11-29 DIAGNOSIS — I63411 Cerebral infarction due to embolism of right middle cerebral artery: Principal | ICD-10-CM | POA: Diagnosis present

## 2018-11-29 DIAGNOSIS — Z79899 Other long term (current) drug therapy: Secondary | ICD-10-CM | POA: Diagnosis not present

## 2018-11-29 DIAGNOSIS — I6529 Occlusion and stenosis of unspecified carotid artery: Secondary | ICD-10-CM | POA: Diagnosis not present

## 2018-11-29 DIAGNOSIS — I6782 Cerebral ischemia: Secondary | ICD-10-CM | POA: Diagnosis not present

## 2018-11-29 DIAGNOSIS — R350 Frequency of micturition: Secondary | ICD-10-CM | POA: Diagnosis not present

## 2018-11-29 DIAGNOSIS — M1990 Unspecified osteoarthritis, unspecified site: Secondary | ICD-10-CM | POA: Diagnosis not present

## 2018-11-29 DIAGNOSIS — R7309 Other abnormal glucose: Secondary | ICD-10-CM | POA: Diagnosis not present

## 2018-11-29 DIAGNOSIS — I10 Essential (primary) hypertension: Secondary | ICD-10-CM | POA: Diagnosis present

## 2018-11-29 DIAGNOSIS — R51 Headache: Secondary | ICD-10-CM | POA: Diagnosis not present

## 2018-11-29 DIAGNOSIS — Z9049 Acquired absence of other specified parts of digestive tract: Secondary | ICD-10-CM

## 2018-11-29 DIAGNOSIS — K5901 Slow transit constipation: Secondary | ICD-10-CM | POA: Diagnosis not present

## 2018-11-29 DIAGNOSIS — Z791 Long term (current) use of non-steroidal anti-inflammatories (NSAID): Secondary | ICD-10-CM

## 2018-11-29 DIAGNOSIS — F419 Anxiety disorder, unspecified: Secondary | ICD-10-CM | POA: Diagnosis present

## 2018-11-29 DIAGNOSIS — I6389 Other cerebral infarction: Secondary | ICD-10-CM | POA: Diagnosis not present

## 2018-11-29 DIAGNOSIS — N390 Urinary tract infection, site not specified: Secondary | ICD-10-CM | POA: Diagnosis not present

## 2018-11-29 DIAGNOSIS — I82402 Acute embolism and thrombosis of unspecified deep veins of left lower extremity: Secondary | ICD-10-CM | POA: Diagnosis not present

## 2018-11-29 DIAGNOSIS — Z6841 Body Mass Index (BMI) 40.0 and over, adult: Secondary | ICD-10-CM

## 2018-11-29 DIAGNOSIS — Z973 Presence of spectacles and contact lenses: Secondary | ICD-10-CM | POA: Diagnosis not present

## 2018-11-29 DIAGNOSIS — F329 Major depressive disorder, single episode, unspecified: Secondary | ICD-10-CM | POA: Diagnosis present

## 2018-11-29 DIAGNOSIS — I63511 Cerebral infarction due to unspecified occlusion or stenosis of right middle cerebral artery: Secondary | ICD-10-CM | POA: Diagnosis not present

## 2018-11-29 DIAGNOSIS — I82432 Acute embolism and thrombosis of left popliteal vein: Secondary | ICD-10-CM | POA: Diagnosis present

## 2018-11-29 DIAGNOSIS — G936 Cerebral edema: Secondary | ICD-10-CM | POA: Diagnosis not present

## 2018-11-29 DIAGNOSIS — R509 Fever, unspecified: Secondary | ICD-10-CM

## 2018-11-29 DIAGNOSIS — J9811 Atelectasis: Secondary | ICD-10-CM | POA: Diagnosis not present

## 2018-11-29 DIAGNOSIS — I82442 Acute embolism and thrombosis of left tibial vein: Secondary | ICD-10-CM | POA: Diagnosis not present

## 2018-11-29 DIAGNOSIS — R414 Neurologic neglect syndrome: Secondary | ICD-10-CM | POA: Diagnosis not present

## 2018-11-29 DIAGNOSIS — R0989 Other specified symptoms and signs involving the circulatory and respiratory systems: Secondary | ICD-10-CM | POA: Diagnosis not present

## 2018-11-29 DIAGNOSIS — I700 Atherosclerosis of aorta: Secondary | ICD-10-CM | POA: Diagnosis present

## 2018-11-29 HISTORY — PX: IR CT HEAD LTD: IMG2386

## 2018-11-29 HISTORY — PX: IR PERCUTANEOUS ART THROMBECTOMY/INFUSION INTRACRANIAL INC DIAG ANGIO: IMG6087

## 2018-11-29 HISTORY — PX: RADIOLOGY WITH ANESTHESIA: SHX6223

## 2018-11-29 LAB — GLUCOSE, CAPILLARY: Glucose-Capillary: 153 mg/dL — ABNORMAL HIGH (ref 70–99)

## 2018-11-29 LAB — URINALYSIS, ROUTINE W REFLEX MICROSCOPIC
Bilirubin Urine: NEGATIVE
Glucose, UA: NEGATIVE mg/dL
Ketones, ur: NEGATIVE mg/dL
Leukocytes,Ua: NEGATIVE
Nitrite: NEGATIVE
Protein, ur: NEGATIVE mg/dL
Specific Gravity, Urine: >1.046 — ABNORMAL HIGH (ref 1.005–1.030)
pH: 6 (ref 5.0–8.0)

## 2018-11-29 LAB — COMPREHENSIVE METABOLIC PANEL
ALT: 19 U/L (ref 0–44)
AST: 21 U/L (ref 15–41)
Albumin: 3.5 g/dL (ref 3.5–5.0)
Alkaline Phosphatase: 82 U/L (ref 38–126)
Anion gap: 11 (ref 5–15)
BUN: 16 mg/dL (ref 8–23)
CO2: 26 mmol/L (ref 22–32)
Calcium: 9.2 mg/dL (ref 8.9–10.3)
Chloride: 101 mmol/L (ref 98–111)
Creatinine, Ser: 0.94 mg/dL (ref 0.44–1.00)
GFR calc Af Amer: >60 mL/min (ref >60)
GFR calc non Af Amer: >60 mL/min (ref >60)
Glucose, Bld: 114 mg/dL — ABNORMAL HIGH (ref 70–99)
Potassium: 4.1 mmol/L (ref 3.5–5.1)
Sodium: 138 mmol/L (ref 135–145)
Total Bilirubin: 0.3 mg/dL (ref 0.3–1.2)
Total Protein: 6.1 g/dL — ABNORMAL LOW (ref 6.5–8.1)

## 2018-11-29 LAB — CBC
HCT: 43.4 % (ref 36.0–46.0)
Hemoglobin: 14.7 g/dL (ref 12.0–15.0)
MCH: 33.3 pg (ref 26.0–34.0)
MCHC: 33.9 g/dL (ref 30.0–36.0)
MCV: 98.4 fL (ref 80.0–100.0)
Platelets: 261 10*3/uL (ref 150–400)
RBC: 4.41 MIL/uL (ref 3.87–5.11)
RDW: 12.9 % (ref 11.5–15.5)
WBC: 10 10*3/uL (ref 4.0–10.5)
nRBC: 0 % (ref 0.0–0.2)

## 2018-11-29 LAB — DIFFERENTIAL
Abs Immature Granulocytes: 0.06 10*3/uL (ref 0.00–0.07)
Basophils Absolute: 0.1 10*3/uL (ref 0.0–0.1)
Basophils Relative: 1 %
Eosinophils Absolute: 0.2 10*3/uL (ref 0.0–0.5)
Eosinophils Relative: 2 %
Immature Granulocytes: 1 %
Lymphocytes Relative: 23 %
Lymphs Abs: 2.3 10*3/uL (ref 0.7–4.0)
Monocytes Absolute: 1.1 10*3/uL — ABNORMAL HIGH (ref 0.1–1.0)
Monocytes Relative: 11 %
Neutro Abs: 6.3 10*3/uL (ref 1.7–7.7)
Neutrophils Relative %: 62 %

## 2018-11-29 LAB — I-STAT CHEM 8, ED
BUN: 20 mg/dL (ref 8–23)
Calcium, Ion: 1.04 mmol/L — ABNORMAL LOW (ref 1.15–1.40)
Chloride: 102 mmol/L (ref 98–111)
Creatinine, Ser: 0.9 mg/dL (ref 0.44–1.00)
Glucose, Bld: 130 mg/dL — ABNORMAL HIGH (ref 70–99)
HCT: 46 % (ref 36.0–46.0)
Hemoglobin: 15.6 g/dL — ABNORMAL HIGH (ref 12.0–15.0)
Potassium: 4.2 mmol/L (ref 3.5–5.1)
Sodium: 136 mmol/L (ref 135–145)
TCO2: 30 mmol/L (ref 22–32)

## 2018-11-29 LAB — SARS CORONAVIRUS 2 BY RT PCR (HOSPITAL ORDER, PERFORMED IN ~~LOC~~ HOSPITAL LAB): SARS Coronavirus 2: NEGATIVE

## 2018-11-29 SURGERY — RADIOLOGY WITH ANESTHESIA
Anesthesia: General

## 2018-11-29 MED ORDER — ROCURONIUM BROMIDE 100 MG/10ML IV SOLN
INTRAVENOUS | Status: DC | PRN
  Administered 2018-11-29: 70 mg via INTRAVENOUS

## 2018-11-29 MED ORDER — IOHEXOL 300 MG/ML  SOLN
75.0000 mL | Freq: Once | INTRAMUSCULAR | Status: AC | PRN
  Administered 2018-11-29: 23:00:00 75 mL via INTRA_ARTERIAL

## 2018-11-29 MED ORDER — LIDOCAINE HCL 1 % IJ SOLN
INTRAMUSCULAR | Status: AC
  Filled 2018-11-29: qty 20

## 2018-11-29 MED ORDER — SENNOSIDES-DOCUSATE SODIUM 8.6-50 MG PO TABS: 1.0000 | ORAL_TABLET | Freq: Every evening | ORAL | Status: DC | PRN

## 2018-11-29 MED ORDER — EPTIFIBATIDE 20 MG/10ML IV SOLN
INTRAVENOUS | Status: AC
  Filled 2018-11-29: qty 10

## 2018-11-29 MED ORDER — FENTANYL CITRATE (PF) 100 MCG/2ML IJ SOLN
INTRAMUSCULAR | Status: DC | PRN
  Administered 2018-11-29: 100 ug via INTRAVENOUS

## 2018-11-29 MED ORDER — SODIUM CHLORIDE 0.9 % IV SOLN
INTRAVENOUS | Status: DC
  Administered 2018-11-30: via INTRAVENOUS

## 2018-11-29 MED ORDER — LIDOCAINE HCL (CARDIAC) PF 100 MG/5ML IV SOSY
PREFILLED_SYRINGE | INTRAVENOUS | Status: DC | PRN
  Administered 2018-11-29: 60 mg via INTRATRACHEAL

## 2018-11-29 MED ORDER — ONDANSETRON HCL 4 MG/2ML IJ SOLN
INTRAMUSCULAR | Status: DC | PRN
  Administered 2018-11-29: 4 mg via INTRAVENOUS

## 2018-11-29 MED ORDER — ACETAMINOPHEN 160 MG/5ML PO SOLN: 650.0000 mg | ORAL | Status: DC | PRN

## 2018-11-29 MED ORDER — CLOPIDOGREL BISULFATE 300 MG PO TABS
ORAL_TABLET | ORAL | Status: AC
  Filled 2018-11-29: qty 1

## 2018-11-29 MED ORDER — ACETAMINOPHEN 650 MG RE SUPP: 650.0000 mg | RECTAL | Status: DC | PRN

## 2018-11-29 MED ORDER — FENTANYL CITRATE (PF) 100 MCG/2ML IJ SOLN
INTRAMUSCULAR | Status: AC
  Filled 2018-11-29: qty 2

## 2018-11-29 MED ORDER — SUGAMMADEX SODIUM 200 MG/2ML IV SOLN
INTRAVENOUS | Status: DC | PRN
  Administered 2018-11-29: 200 mg via INTRAVENOUS

## 2018-11-29 MED ORDER — ACETAMINOPHEN 325 MG PO TABS
650.0000 mg | ORAL_TABLET | ORAL | Status: DC | PRN
  Administered 2018-12-01 (×4): 650 mg via ORAL
  Filled 2018-11-29 (×4): qty 2

## 2018-11-29 MED ORDER — PHENYLEPHRINE HCL-NACL 10-0.9 MG/250ML-% IV SOLN
INTRAVENOUS | Status: AC
  Filled 2018-11-29: qty 250

## 2018-11-29 MED ORDER — LACTATED RINGERS IV SOLN
INTRAVENOUS | Status: DC | PRN
  Administered 2018-11-29: 21:00:00 via INTRAVENOUS

## 2018-11-29 MED ORDER — TIROFIBAN HCL IN NACL 5-0.9 MG/100ML-% IV SOLN
INTRAVENOUS | Status: AC
  Filled 2018-11-29: qty 100

## 2018-11-29 MED ORDER — SODIUM CHLORIDE 0.9 % IV SOLN: 50.0000 mL | Freq: Once | INTRAVENOUS | Status: DC

## 2018-11-29 MED ORDER — PROPOFOL 10 MG/ML IV BOLUS
INTRAVENOUS | Status: DC | PRN
  Administered 2018-11-29: 100 mg via INTRAVENOUS
  Administered 2018-11-29: 60 mg via INTRAVENOUS
  Administered 2018-11-29: 50 mg via INTRAVENOUS

## 2018-11-29 MED ORDER — CHLORHEXIDINE GLUCONATE CLOTH 2 % EX PADS
6.0000 | MEDICATED_PAD | Freq: Every day | CUTANEOUS | Status: DC
  Administered 2018-11-30: 6 via TOPICAL

## 2018-11-29 MED ORDER — CEFAZOLIN SODIUM-DEXTROSE 2-4 GM/100ML-% IV SOLN
INTRAVENOUS | Status: AC
  Filled 2018-11-29: qty 100

## 2018-11-29 MED ORDER — SODIUM CHLORIDE 0.9 % IV SOLN
INTRAVENOUS | Status: DC | PRN
  Administered 2018-11-29: 50 ug/min via INTRAVENOUS

## 2018-11-29 MED ORDER — CEFAZOLIN SODIUM-DEXTROSE 2-3 GM-%(50ML) IV SOLR
INTRAVENOUS | Status: DC | PRN
  Administered 2018-11-29: 2 g via INTRAVENOUS

## 2018-11-29 MED ORDER — ALTEPLASE (STROKE) FULL DOSE INFUSION
90.0000 mg | Freq: Once | INTRAVENOUS | Status: AC
  Administered 2018-11-29: 90 mg via INTRAVENOUS
  Filled 2018-11-29: qty 100

## 2018-11-29 MED ORDER — ASPIRIN 325 MG PO TABS
ORAL_TABLET | ORAL | Status: AC
  Filled 2018-11-29: qty 1

## 2018-11-29 MED ORDER — SODIUM CHLORIDE 0.9% FLUSH: 3.0000 mL | Freq: Once | INTRAVENOUS | Status: DC

## 2018-11-29 MED ORDER — STROKE: EARLY STAGES OF RECOVERY BOOK
Freq: Once | Status: DC
  Filled 2018-11-29: qty 1

## 2018-11-29 MED ORDER — LABETALOL HCL 5 MG/ML IV SOLN
INTRAVENOUS | Status: DC | PRN
  Administered 2018-11-29: 5 mg via INTRAVENOUS
  Administered 2018-11-29: 10 mg via INTRAVENOUS
  Administered 2018-11-29: 5 mg via INTRAVENOUS
  Administered 2018-11-29: 10 mg via INTRAVENOUS

## 2018-11-29 MED ORDER — IOHEXOL 350 MG/ML SOLN
125.0000 mL | Freq: Once | INTRAVENOUS | Status: AC | PRN
  Administered 2018-11-29: 125 mL via INTRAVENOUS

## 2018-11-29 MED ORDER — SODIUM CHLORIDE 0.9 % IV SOLN: INTRAVENOUS | Status: DC

## 2018-11-29 MED ORDER — ACETAMINOPHEN 325 MG PO TABS: 650.0000 mg | ORAL_TABLET | ORAL | Status: DC | PRN

## 2018-11-29 MED ORDER — TICAGRELOR 90 MG PO TABS
ORAL_TABLET | ORAL | Status: AC
  Filled 2018-11-29: qty 2

## 2018-11-29 MED ORDER — PANTOPRAZOLE SODIUM 40 MG IV SOLR
40.0000 mg | Freq: Every day | INTRAVENOUS | Status: DC
  Administered 2018-11-30: 22:00:00 40 mg via INTRAVENOUS
  Filled 2018-11-29: qty 40

## 2018-11-29 MED ORDER — CLEVIDIPINE BUTYRATE 0.5 MG/ML IV EMUL: 0.0000 mg/h | INTRAVENOUS | Status: DC

## 2018-11-29 MED ORDER — DEXAMETHASONE SODIUM PHOSPHATE 10 MG/ML IJ SOLN
INTRAMUSCULAR | Status: DC | PRN
  Administered 2018-11-29: 10 mg via INTRAVENOUS

## 2018-11-29 MED ORDER — NITROGLYCERIN 1 MG/10 ML FOR IR/CATH LAB
INTRA_ARTERIAL | Status: AC
  Administered 2018-11-29: 50 ug via INTRA_ARTERIAL
  Filled 2018-11-29: qty 10

## 2018-11-29 MED ORDER — LABETALOL HCL 5 MG/ML IV SOLN: 20.0000 mg | Freq: Once | INTRAVENOUS | Status: DC

## 2018-11-29 MED ORDER — SUCCINYLCHOLINE CHLORIDE 20 MG/ML IJ SOLN
INTRAMUSCULAR | Status: DC | PRN
  Administered 2018-11-29: 100 mg via INTRAVENOUS

## 2018-11-29 MED ORDER — CLEVIDIPINE BUTYRATE 0.5 MG/ML IV EMUL
0.0000 mg/h | INTRAVENOUS | Status: AC
  Administered 2018-11-30: 7 mg/h via INTRAVENOUS
  Administered 2018-11-30 (×2): 4 mg/h via INTRAVENOUS
  Administered 2018-11-30: 08:00:00 2 mg/h via INTRAVENOUS
  Filled 2018-11-29 (×5): qty 50

## 2018-11-29 NOTE — H&P (Signed)
H&P  Reason for Consult: Stroke Referring Physician: Dr. Gilford Raid  CC: left sided weakness, neglect  History is obtained from: Husband Mr. Jezelle Gullick and chart Consent obtained form the phone 24 white call with [facility], Dr. Dorien Chihuahua, patient's husband by my side and Ms. Miranda Caviness-patient's daughter.   HPI: Deanna [name] is a 68 y.o. female past medical history of no significant medical issues and on no medications, who comes in via EMS for evaluation of left-sided tingling and right gaze preference that was noted by family around 5:45 PM and according to the husband she was last seen normal around 4:30 PM today on 11/29/2018. She had no preceding illnesses or sicknesses.  No cough cold flulike symptoms fevers chills. Denies any prior history of strokes.  Denies any past medical history for which she is on any medications. Denies any similar symptoms in the past.  Denies any TIA-like episodes. Reports of a minor headache that has been going on. Also reports that there was some odd behavior involving neglect of the left side at home right after it was noticed that she was complaining of the left-sided tingling.  At that time daughter also noticed that she was gazing more to the right and was not looking all the way to the left. At the ER bridge, she was evaluated, NIH stroke scale 7 as documented below. Risks benefits for TPA discussed with her and IV TPA administered. CT angiogram obtained that showed a right M2 occlusion versus severe stenosis as well as a CT perfusion study which was obtained after a repeat attempt due to patient motion and cooperation, that revealed a favorable perfusion profile as documented below   LKW: 4:30 PM on 11/29/2018 tpa given?:  Yes Premorbid modified Rankin scale (mRS):2  ROS: A 14 point ROS was performed and is negative except as noted in the HPI.   Past Medical History:  Diagnosis Date  . Arthritis    osteoarthritis in  both knees  . Asthma    . Difficult intravenous access   . GERD (gastroesophageal reflux disease)   . Heart murmur    born with years ago  . Pre-diabetes    last A1C=6.0 per pt  . Torn meniscus    left knee w fracture x2 to left knee  . Wears glasses    No family history on file. Patient unable to provide  Social History:   reports that she has never smoked. She has never used smokeless tobacco. She reports that she does not drink alcohol or use drugs.  Medications  Current Facility-Administered Medications:  .   stroke: mapping our early stages of recovery book, , Does not apply, Once, Amie Portland, MD .  alteplase (ACTIVASE) 1 mg/mL infusion 90 mg, 90 mg, Intravenous, Once, Last Rate: 90 mL/hr at 11/29/18 1910, 90 mg at 11/29/18 1910 **FOLLOWED BY** 0.9 %  sodium chloride infusion, 50 mL, Intravenous, Once, Isla Pence, MD .  0.9 %  sodium chloride infusion, , Intravenous, Continuous, Amie Portland, MD .  acetaminophen (TYLENOL) tablet 650 mg, 650 mg, Oral, Q4H PRN **OR** acetaminophen (TYLENOL) solution 650 mg, 650 mg, Per Tube, Q4H PRN **OR** acetaminophen (TYLENOL) suppository 650 mg, 650 mg, Rectal, Q4H PRN, Amie Portland, MD .  labetalol (NORMODYNE) injection 20 mg, 20 mg, Intravenous, Once **AND** clevidipine (CLEVIPREX) infusion 0.5 mg/mL, 0-21 mg/hr, Intravenous, Continuous, Amie Portland, MD .  pantoprazole (PROTONIX) injection 40 mg, 40 mg, Intravenous, QHS, Amie Portland, MD .  senna-docusate (Senokot-S) tablet 1 tablet, 1  tablet, Oral, QHS PRN, Amie Portland, MD .  sodium chloride flush (NS) 0.9 % injection 3 mL, 3 mL, Intravenous, Once, Isla Pence, MD  Current Outpatient Medications:  .  albuterol (PROVENTIL HFA;VENTOLIN HFA) 108 (90 Base) MCG/ACT inhaler, Inhale 2 puffs into the lungs every 4 (four) hours as needed for wheezing or shortness of breath., Disp: , Rfl:  .  calcium-vitamin D (OSCAL WITH D) 500-200 MG-UNIT tablet, Take 1 tablet by mouth daily with breakfast., Disp: ,  Rfl:  .  cephALEXin (KEFLEX) 500 MG capsule, Take 1 capsule (500 mg total) by mouth 3 (three) times daily., Disp: 12 capsule, Rfl: 0 .  diclofenac sodium (VOLTAREN) 1 % GEL, Apply topically as needed., Disp: , Rfl:  .  Omega-3 Fatty Acids (OMEGA-3 FISH OIL PO), Take by mouth. 1600 mg q day, Disp: , Rfl:  .  ranitidine (ZANTAC) 150 MG tablet, Take 150 mg by mouth daily. OTC, Disp: , Rfl:  .  UNABLE TO FIND, womens plus multivitamin, Disp: , Rfl:   Exam: Current vital signs: BP (!) 149/81   Pulse 78   Temp 98.7 F (37.1 C)   Resp 18   Ht 5\' 1"  (1.549 m)   Wt 111.4 kg   LMP  (LMP Unknown)   SpO2 100%   BMI 46.40 kg/m  Vital signs in last 24 hours: Temp:  [98.6 F (37 C)-98.7 F (37.1 C)] 98.7 F (37.1 C) (07/18 1925) Pulse Rate:  [78-85] 78 (07/18 1925) Resp:  [18-20] 18 (07/18 1925) BP: (149-150)/(71-81) 149/81 (07/18 1925) SpO2:  [100 %] 100 % (07/18 1925) Weight:  [111.4 kg] 111.4 kg (07/18 1922) General: Awake alert in no distress HEENT: Normocephalic atraumatic dry mucous membranes  Clear to auscultation Cardiovascular: S1-2 heard regular rate rhythm Abdomen soft nondistended nontender Extremities warm well perfused Neurological exam Mental status: She is awake, alert, oriented x3 Her speech is very mildly dysarthric. Naming comprehension repetition and fluency are intact although slightly slow. Cranial nerves: Pupils equal round reactive to light, extraocular movement examination shows a right gaze preference but she is able to look to the left, there is a noticeable right homonymous hemianopsia, no facial asymmetry, no decrease in facial sensation, tongue midline palate midline. Motor exam: There is no drift on outstretched arms but left hand grip is weaker than right.  Similarly on the leg, no vertical drift on the leg on the left but individual muscle testing reveals 4+/5 strength on the left lower extremity. Sensory exam: There is decreased sensation on the left leg  compared to the right and there is neglect on double simultaneous stimulation.  There is also neglect on double simultaneous stimulation visually.  Neglects her left arm as well. Coordination: No ataxia Gait testing was deferred at this time   NIHSS 1a Level of Conscious.: 0 1b LOC Questions: 0 1c LOC Commands: 0 2 Best Gaze: 1 3 Visual: 2 4 Facial Palsy: 0 5a Motor Arm - left: 0 5b Motor Arm - Right: 0 6a Motor Leg - Left: 0 6b Motor Leg - Right: 0 7 Limb Ataxia: 0 8 Sensory: 1 9 Best Language: 0 10 Dysarthria: 1 11 Extinct. and Inatten.: 2 TOTAL: 7    Labs I have reviewed labs in epic and the results pertinent to this consultation are:   CBC    Component Value Date/Time   WBC 10.0 11/29/2018 1955   RBC 4.41 11/29/2018 1955   HGB 14.7 11/29/2018 1955   HCT 43.4 11/29/2018 1955  PLT 261 11/29/2018 1955   MCV 98.4 11/29/2018 1955   MCH 33.3 11/29/2018 1955   MCHC 33.9 11/29/2018 1955   RDW 12.9 11/29/2018 1955   LYMPHSABS 2.3 11/29/2018 1955   MONOABS 1.1 (H) 11/29/2018 1955   EOSABS 0.2 11/29/2018 1955   BASOSABS 0.1 11/29/2018 1955    CMP     Component Value Date/Time   NA 136 11/29/2018 1902   K 4.2 11/29/2018 1902   CL 102 11/29/2018 1902   GLUCOSE 130 (H) 11/29/2018 1902   BUN 20 11/29/2018 1902   CREATININE 0.90 11/29/2018 1902   Imaging I have reviewed the images obtained:  CT-scan of the brain-dense right MCA, no bleed, aspects 7.  CT angios head and neck with right M2 occlusion.  CT perfusion with a favorable perfusion profile of 15 cc core and 44 cc penumbra in the same vascular territory.  Assessment: 68 year old with sudden onset of left-sided tingling, right gaze preference and left-sided neglect with an NIH stroke scale of 7 on presentation consistent with a left MCA territory infarct with an aspect score of 6-7 and favorable CT perfusion profile. IV TPA given. Endovascular notified but recommended waiting for obtaining CT perfusion  given the borderline aspect score. Initial CT perfusion failed due to patient movement. Following this, the IV access for CT perfusion was lost and another access had to be attempted. Final CT perfusion showed a favorable profile as listed above Endovascular recontacted-Dr. Estanislado Pandy, patient's husband, his daughter on a four-way call with [facility]-husband and daughter consented for the procedure. IR code stroke paged out  Impression: Acute ischemic stroke- unclear etiology No significant past medical history  Recommendations: Cerebral infarction due to embolism of right middle cerebral artery  Acuity: Acute Current Suspected Etiology: under investigation Continue Evaluation:  -Admit to: Neurological ICU  -Hold Aspirin until 24 hour post tPA neuroimaging is stable and without evidence of bleeding -Blood pressure control, goal of SYS <180 post TPA protocol.  If successfully revascularized, goal per neuro endovascular-generally 120-140 in the first 24 hours with successful revascularization. -MRI/ECHO/A1C/Lipid panel. -Hyperglycemia management per SSI to maintain glucose 140-180mg /dL. -PT/OT/ST therapies and recommendations when able  CNS Close neuro monitoring N.p.o. until cleared by speech or bedside swallow evaluation PT/OT/speech therapy PMNR consult  RESP Being intubated for the procedure -Attempt to wean after procedure -If not wean, PCCM consultation for vent management   CV No significant past medical history but probably has underlying essential hypertension Blood pressure management as above Use labetalol and Cleviprex for blood pressure control Get transthoracic echo For hyperlipidemia- goal LDL less than 70, start statin If paroxysmal A. fib is found on telemetry, might need anticoagulation in the long-term If no A. fib is detected and no other causes detected, might consider long-term cardiac monitoring with 30-day monitor versus loop recorder-will defer to  stroke team.  HEME No acute issues Check labs in the morning  ENDO Hyperglycemia Not a known diabetic Fingersticks Goal A1c less than 7  GI/GU No acute issues  Fluid/Electrolyte Disorders Check labs and replete as necessary  ID Possible Aspiration PNA -CXR -NPO -Monitor  Nutrition E66.9 Obesity -diet consult  Prophylaxis DVT: SCD GI: PPI Bowel: Docusate senna  Diet: NPO until cleared by speech  Code Status: Full Code    THE FOLLOWING WERE PRESENT ON ADMISSION: Acute ischemic stroke.  Essential hypertension.  Delays in the process: IV access for the patient for perfusion was difficult CT perfusion failed the first time due to patient motion. IV access  failed subsequently and had to be reaccessed    Care transitioned to oncoming neuro hospitalist as shifts will change while patient is in IR. For questions overnight, called the on-call neuro hospitalist.  Stroke team will follow in the morning.  -- Amie Portland, MD [facility] Pager: [phone] If 7pm to 7am, please call on call as listed on [facility].   CRITICAL CARE ATTESTATION Performed by: Amie Portland, MD Total critical care time: 65 minutes Critical care time was exclusive of separately billable procedures and treating other patients and/or supervising APPs/Residents/Students Critical care was necessary to treat or prevent imminent or life-threatening deterioration due to acute ischemic stroke This patient is critically ill and at significant risk for neurological worsening and/or death and care requires constant monitoring. Critical care was time spent personally by me on the following activities: development of treatment plan with patient and/or surrogate as well as nursing, discussions with consultants, evaluation of patient's response to treatment, examination of patient, obtaining history from patient or surrogate, ordering and performing treatments and interventions, ordering and review  of laboratory studies, ordering and review of radiographic studies, pulse oximetry, re-evaluation of patient's condition, participation in multidisciplinary rounds and medical decision making of high complexity in the care of this patient.

## 2018-11-29 NOTE — ED Triage Notes (Signed)
BIB EMS as Code Stroke. LKW 1800, pt was sitting at table eating dinner when she had sudden onset of dec sensation to L side with  R sided gaze. A/OX4, VSS.

## 2018-11-29 NOTE — Progress Notes (Signed)
For updates  Contact husband Sherrel Ploch on his mobile at [phone].

## 2018-11-29 NOTE — Transfer of Care (Signed)
Immediate Anesthesia Transfer of Care Note  Patient: Deanna [name]  Procedure(s) Performed: RADIOLOGY WITH ANESTHESIA (N/A )  Patient Location: ICU  Anesthesia Type:General  Level of Consciousness: drowsy, pateint uncooperative, confused and responds to stimulation  Airway & Oxygen Therapy: Patient Spontanous Breathing and Patient connected to face mask oxygen  Post-op Assessment: Report given to RN, Post -op Vital signs reviewed and stable and Patient moving all extremities X 4  Post vital signs: Reviewed and stable  Last Vitals:  Vitals Value Taken Time  BP    Temp    Pulse 90 11/29/18 2316  Resp 18 11/29/18 2316  SpO2 92 % 11/29/18 2316  Vitals shown include unvalidated device data.  Last Pain:  Vitals:   11/29/18 1922  TempSrc:   PainSc: 0-No pain         Complications: No apparent anesthesia complications

## 2018-11-29 NOTE — Anesthesia Preprocedure Evaluation (Signed)
Anesthesia Evaluation  Patient identified by MRN, date of birth, ID band Patient awake    Reviewed: Allergy & Precautions, NPO status , Patient's Chart, lab work & pertinent test results  History of Anesthesia Complications Negative for: history of anesthetic complications  Airway Mallampati: II  TM Distance: >3 FB Neck ROM: Full    Dental  (+) Teeth Intact, Caps,    Pulmonary asthma ,    breath sounds clear to auscultation       Cardiovascular + Valvular Problems/Murmurs  Rhythm:Regular     Neuro/Psych CVA, Residual Symptoms    GI/Hepatic Neg liver ROS, GERD  Medicated,  Endo/Other  Morbid obesity  Renal/GU negative Renal ROS     Musculoskeletal  (+) Arthritis ,   Abdominal (+) + obese,   Peds  Hematology   Anesthesia Other Findings Left ventricle: The cavity size was normal. Wall thickness was   increased in a pattern of mild LVH. Systolic function was normal.   The estimated ejection fraction was in the range of 60% to 65%.   Wall motion was normal; there were no regional wall motion   abnormalities. Doppler parameters are consistent with abnormal   left ventricular relaxation (grade 1 diastolic dysfunction). - Mitral valve: There was mild regurgitation.  Reproductive/Obstetrics                             Anesthesia Physical Anesthesia Plan  ASA: IV and emergent  Anesthesia Plan: General   Post-op Pain Management:    Induction: Intravenous, Rapid sequence and Cricoid pressure planned  PONV Risk Score and Plan: 3 and Ondansetron, Dexamethasone and Treatment may vary due to age or medical condition  Airway Management Planned: Oral ETT  Additional Equipment: Arterial line  Intra-op Plan:   Post-operative Plan: Extubation in OR and Possible Post-op intubation/ventilation  Informed Consent: I have reviewed the patients History and Physical, chart, labs and discussed the  procedure including the risks, benefits and alternatives for the proposed anesthesia with the patient or authorized representative who has indicated his/her understanding and acceptance.     Dental advisory given  Plan Discussed with: CRNA and Surgeon  Anesthesia Plan Comments:         Anesthesia Quick Evaluation

## 2018-11-29 NOTE — ED Notes (Signed)
Pt husband took all belongings (phone, clothes, shoes) Pt glasses remain @ bedside

## 2018-11-29 NOTE — ED Notes (Signed)
Pt husband @ bedside to speak with pt.

## 2018-11-29 NOTE — Progress Notes (Signed)
Patient ID: Deanna [name], female   DOB: [birth year], 68 y.o.   MRN: [id number] INR. [place]christ PM  MRRS of 1 to 2. New onset of Lt sided paresthesiasand RT gaze preferernce. CT Brain NO ICH  ASPECTS  7 to8 . CTA RT MCA distal M1 v prox inf division. CTP core of 53ml with a penumbra of 39ml Tmax > 6 s  Vol of 58ml. Option of endovascular revascularization D/W husband and daughter on a 3 way telephone. Procedure,reasons,risks and alternatives reviewed.Risks of ICH of 10 % ,worsening neuro deficits,death ,inability to revascularize and vascular injury reviewed. They both expressed understanding and provided informed consent to proceed with the treatment. S.Lukka Black MD

## 2018-11-29 NOTE — ED Provider Notes (Signed)
[facility] EMERGENCY DEPARTMENT Provider Note   CSN: [id number] Arrival date & time: 11/29/18  1850    History   Chief Complaint Chief Complaint  Patient presents with  . Code Stroke    HPI Deanna [name] is a 68 y.o. female.     Pt presents to the ED today as a code stroke.  She was LSN at 1630.  At 1800, she had a sudden onset of decreased sensation to the left side with a left sided neglect per EMS.  The pt was met by the stroke team at was taken to the [place].       Past Medical History:  Diagnosis Date  . Arthritis    osteoarthritis in  both knees  . Asthma   . Difficult intravenous access   . GERD (gastroesophageal reflux disease)   . Heart murmur    born with years ago  . Pre-diabetes    last A1C=6.0 per pt  . Torn meniscus    left knee w fracture x2 to left knee  . Wears glasses     Patient Active Problem List   Diagnosis Date Noted  . Acute ischemic stroke ([place]) 11/29/2018  . S/P right knee arthroscopy 07/09/2017  . S/P knee surgery 01/10/2017    Past Surgical History:  Procedure Laterality Date  . CHOLECYSTECTOMY    . CHONDROPLASTY Right 07/09/2017   Procedure: CHONDROPLASTY;  Surgeon: Sydnee Cabal, MD;  Location: [facility];  Service: Orthopedics;  Laterality: Right;  . HERNIA REPAIR Right age 6   inguinal  . KNEE ARTHROSCOPY WITH MEDIAL MENISECTOMY Left 01/10/2017   Procedure: LEFT KNEE ARTHROSCOPY, DEBRIDEMENT, PARTIAL MEDIAL MENISCECTOMY, CHONDROPLASTY, ARTHROSCOPIC ASSISTED INTERNAL FIXATION OF MEDIAL FEMORAL CONDYLE AND MEDIAL TIBIAL PLATEAU INSUFFICIENCY FRACTURES;  Surgeon: Sydnee Cabal, MD;  Location: [place];  Service: Orthopedics;  Laterality: Left;  . KNEE ARTHROSCOPY WITH MEDIAL MENISECTOMY Right 07/09/2017   Procedure: Right knee scope, debridement, chondroplasty, partial meniscectomy, internal arthroscopic assisted internal fixation of medial tibial plateau fracture;   Surgeon: Sydnee Cabal, MD;  Location: [facility];  Service: Orthopedics;  Laterality: Right;  . TUBAL LIGATION  age 58     OB History   No obstetric history on file.      Home Medications    Prior to Admission medications   Medication Sig Start Date End Date Taking? Authorizing Provider  albuterol (PROVENTIL HFA;VENTOLIN HFA) 108 (90 Base) MCG/ACT inhaler Inhale 2 puffs into the lungs every 4 (four) hours as needed for wheezing or shortness of breath.    [provider]  calcium-vitamin D (OSCAL WITH D) 500-200 MG-UNIT tablet Take 1 tablet by mouth daily with breakfast.    [provider]  cephALEXin (KEFLEX) 500 MG capsule Take 1 capsule (500 mg total) by mouth 3 (three) times daily. 07/09/17   Stilwell, Bryson L, PA-C  diclofenac sodium (VOLTAREN) 1 % GEL Apply topically as needed.    [provider]  Omega-3 Fatty Acids (OMEGA-3 FISH OIL PO) Take by mouth. 1600 mg q day    [provider]  ranitidine (ZANTAC) 150 MG tablet Take 150 mg by mouth daily. OTC    [provider]  UNABLE TO FIND womens plus multivitamin    [provider]    Family History No family history on file.  Social History Social History   Tobacco Use  . Smoking status: Never Smoker  . Smokeless tobacco: Never Used  Substance Use Topics  .  Alcohol use: No  . Drug use: No     Allergies   Patient has no known allergies.   Review of Systems Review of Systems  Eyes: Positive for visual disturbance.  Neurological: Positive for weakness and numbness.  All other systems reviewed and are negative.    Physical Exam Updated Vital Signs BP (!) 156/75   Pulse 97   Temp 98.3 F (36.8 C)   Resp 16   Ht '5\' 1"'$  (1.549 m)   Wt 111.4 kg   LMP  (LMP Unknown)   SpO2 96%   BMI 46.40 kg/m   Physical Exam Vitals signs and nursing note reviewed.  Constitutional:      Appearance: Normal appearance.  HENT:     Head: Normocephalic  and atraumatic.     Right Ear: External ear normal.     Left Ear: External ear normal.     Nose: Nose normal.     Mouth/Throat:     Mouth: Mucous membranes are moist.     Pharynx: Oropharynx is clear.  Eyes:     Extraocular Movements: Extraocular movements intact.     Conjunctiva/sclera: Conjunctivae normal.     Pupils: Pupils are equal, round, and reactive to light.  Neck:     Musculoskeletal: Normal range of motion and neck supple.  Cardiovascular:     Rate and Rhythm: Normal rate and regular rhythm.     Pulses: Normal pulses.     Heart sounds: Normal heart sounds.  Pulmonary:     Effort: Pulmonary effort is normal.     Breath sounds: Normal breath sounds.  Abdominal:     General: Abdomen is flat. Bowel sounds are normal.     Palpations: Abdomen is soft.  Musculoskeletal: Normal range of motion.  Skin:    General: Skin is warm.     Capillary Refill: Capillary refill takes less than 2 seconds.  Neurological:     Mental Status: She is alert.     Comments: Weakness to lue, left sided neglect  Psychiatric:        Mood and Affect: Mood normal.        Behavior: Behavior normal.      ED Treatments / Results  Labs (all labs ordered are listed, but only abnormal results are displayed) Labs Reviewed  DIFFERENTIAL - Abnormal; Notable for the following components:      Result Value   Monocytes Absolute 1.1 (*)    All other components within normal limits  I-STAT CHEM 8, ED - Abnormal; Notable for the following components:   Glucose, Bld 130 (*)    Calcium, Ion 1.04 (*)    Hemoglobin 15.6 (*)    All other components within normal limits  SARS CORONAVIRUS 2 (HOSPITAL ORDER, [place] [facility])  CBC  COMPREHENSIVE METABOLIC PANEL  HIV ANTIBODY (ROUTINE TESTING W REFLEX)  HEMOGLOBIN A1C  LIPID PANEL  PROTIME-INR  APTT    EKG None  EKG: NSR  HR 96.  NO st or t wave changes.   Radiology Ct Head Code Stroke Wo Contrast  Result Date: 11/29/2018  CLINICAL DATA:  Code stroke. Rightward gaze. Left-sided tingling. Confusion EXAM: CT HEAD WITHOUT CONTRAST TECHNIQUE: Contiguous axial images were obtained from the base of the skull through the vertex without intravenous contrast. COMPARISON:  None. FINDINGS: Brain: There is loss of gray-white differentiation consistent with acute infarction in the posterior right MCA territory involving the posterior temporal lobe, parietal lobe, and posterior insula. No acute intracranial  hemorrhage, mass, midline shift, or extra-axial fluid collection is identified. The ventricles and sulci are within normal limits for age. Vascular: Calcified atherosclerosis at the skull base. Hyperdense right MCA branch vessels in the insula. Skull: No fracture or focal osseous lesion. Sinuses/Orbits: Paranasal sinuses and mastoid air cells are clear. Unremarkable orbits. Other: None. ASPECTS ([place] Stroke Program Early CT Score) - Ganglionic level infarction (caudate, lentiform nuclei, internal capsule, insula, M1-M3 cortex): 4 (insula, M2, and M3 involvement) - Supraganglionic infarction (M4-M6 cortex): 2 (M6 involvement) Total score (0-10 with 10 being normal): 6 IMPRESSION: 1. Acute posterior right MCA infarct with hyperdense right M2 branches. No hemorrhage. 2. ASPECTS is 6. These results were communicated to Dr. Rory Percy at 7:11 pm on 11/29/2018 by text page via the [facility] messaging system. Electronically Signed   By: Logan Bores M.D.   On: 11/29/2018 19:15    Procedures Procedures (including critical care time)  Medications Ordered in ED Medications  sodium chloride flush (NS) 0.9 % injection 3 mL (has no administration in time range)  alteplase (ACTIVASE) 1 mg/mL infusion 90 mg (90 mg Intravenous New Bag/Given 11/29/18 1910)    Followed by  0.9 %  sodium chloride infusion (has no administration in time range)   stroke: mapping our early stages of recovery book (has no administration in time range)  0.9 %  sodium chloride  infusion (has no administration in time range)  acetaminophen (TYLENOL) tablet 650 mg (has no administration in time range)    Or  acetaminophen (TYLENOL) solution 650 mg (has no administration in time range)    Or  acetaminophen (TYLENOL) suppository 650 mg (has no administration in time range)  senna-docusate (Senokot-S) tablet 1 tablet (has no administration in time range)  pantoprazole (PROTONIX) injection 40 mg (has no administration in time range)  labetalol (NORMODYNE) injection 20 mg (has no administration in time range)    And  clevidipine (CLEVIPREX) infusion 0.5 mg/mL (has no administration in time range)  iohexol (OMNIPAQUE) 350 MG/ML injection 125 mL (125 mLs Intravenous Contrast Given 11/29/18 1942)     Initial Impression / Assessment and Plan / ED Course  I have reviewed the triage vital signs and the nursing notes.  Pertinent labs & imaging results that were available during my care of the patient were reviewed by me and considered in my medical decision making (see chart for details).       Pt was given tpa by Dr. Rory Percy.  Pt still has residual weakness on the left side, so she is going to go to IR.    Final Clinical Impressions(s) / ED Diagnoses   Final diagnoses:  Acute ischemic stroke [facility])    ED Discharge Orders    None       Isla Pence, MD 11/29/18 2017

## 2018-11-29 NOTE — Anesthesia Procedure Notes (Addendum)
Arterial Line Insertion Start/End7/18/2020 9:33 AM, 11/29/2018 9:43 AM Performed by: Oleta Mouse, MD, anesthesiologist  Patient location: OR. Preanesthetic checklist: patient identified, IV checked, site marked, risks and benefits discussed, surgical consent, monitors and equipment checked, pre-op evaluation, timeout performed and anesthesia consent Lidocaine 1% used for infiltration Right, radial was placed Catheter size: 20 Fr Hand hygiene performed  and maximum sterile barriers used   Attempts: 1 Procedure performed without using ultrasound guided technique. Following insertion, dressing applied and Biopatch. Post procedure assessment: normal and unchanged  Patient tolerated the procedure well with no immediate complications.

## 2018-11-29 NOTE — ED Notes (Signed)
Anesthesia @ bedside preparing for intubation

## 2018-11-29 NOTE — Procedures (Signed)
S/P RT common carotid arterriogram followed  By revascularization of occluded Rt MCA inf division with x 1 pass with 42mm x 34mm embotrap retriever device and penumbra aspiration achieving a TICI 2b+(TICI 2c ) revascularization. S.Ajani Rineer MD

## 2018-11-29 NOTE — Progress Notes (Signed)
Patient ID: Deanna [name], female   DOB: 01-10-1951, 68 y.o.   MRN: [id number] INR Post procedure CT brain NO gross hemorrhage  Or mass effect.left Contrast blush in the rt post frontal parietal region. RT groin sheath replaced with an 8 F angioseal.RT groin softy . Distal pulses DPs and PTs palpable  bilaterally. Extubated without difficulty. Moving all 4s equally. S.Taryn Nave MD

## 2018-11-29 NOTE — ED Notes (Signed)
Pt taken to IR with Primary RN, Anesthesia, RT

## 2018-11-29 NOTE — Anesthesia Procedure Notes (Signed)
Procedure Name: Intubation Date/Time: 11/29/2018 9:01 PM Performed by: Claris Che, CRNA Pre-anesthesia Checklist: Patient identified, Emergency Drugs available, Suction available, Patient being monitored and Timeout performed Patient Re-evaluated:Patient Re-evaluated prior to induction Oxygen Delivery Method: Circle system utilized Preoxygenation: Pre-oxygenation with 100% oxygen Induction Type: IV induction, Rapid sequence and Cricoid Pressure applied Laryngoscope Size: Mac and 3 Grade View: Grade II Tube type: Oral Tube size: 8.0 mm Number of attempts: 1 Airway Equipment and Method: Stylet Placement Confirmation: ETT inserted through vocal cords under direct vision,  positive ETCO2,  CO2 detector and breath sounds checked- equal and bilateral Secured at: 23 cm Tube secured with: Tape Dental Injury: Teeth and Oropharynx as per pre-operative assessment

## 2018-11-29 NOTE — Progress Notes (Signed)
PHARMACIST CODE STROKE RESPONSE  Notified to mix tPA at 1901 by Dr. Rory Percy Delivered tPA to RN at 1904  tPA dose = 9 mg bolus over 1 minute followed by 81 mg for a total dose of 90 mg over 1 hour  Issues/delays encountered (if applicable): N/A  Antonietta Jewel, PharmD, [place] Clinical Pharmacist  Pager: [phone] Phone: [phone] 11/29/18 7:48 PM

## 2018-11-30 ENCOUNTER — Inpatient Hospital Stay (HOSPITAL_COMMUNITY): Payer: Medicare Other

## 2018-11-30 DIAGNOSIS — I639 Cerebral infarction, unspecified: Secondary | ICD-10-CM

## 2018-11-30 LAB — CBC WITH DIFFERENTIAL/PLATELET
Abs Immature Granulocytes: 0.1 10*3/uL — ABNORMAL HIGH (ref 0.00–0.07)
Basophils Absolute: 0 10*3/uL (ref 0.0–0.1)
Basophils Relative: 0 %
Eosinophils Absolute: 0 10*3/uL (ref 0.0–0.5)
Eosinophils Relative: 0 %
HCT: 40.7 % (ref 36.0–46.0)
Hemoglobin: 14 g/dL (ref 12.0–15.0)
Immature Granulocytes: 1 %
Lymphocytes Relative: 9 %
Lymphs Abs: 1 10*3/uL (ref 0.7–4.0)
MCH: 34.1 pg — ABNORMAL HIGH (ref 26.0–34.0)
MCHC: 34.4 g/dL (ref 30.0–36.0)
MCV: 99 fL (ref 80.0–100.0)
Monocytes Absolute: 0.2 10*3/uL (ref 0.1–1.0)
Monocytes Relative: 2 %
Neutro Abs: 9.3 10*3/uL — ABNORMAL HIGH (ref 1.7–7.7)
Neutrophils Relative %: 88 %
Platelets: 286 10*3/uL (ref 150–400)
RBC: 4.11 MIL/uL (ref 3.87–5.11)
RDW: 13 % (ref 11.5–15.5)
WBC: 10.6 10*3/uL — ABNORMAL HIGH (ref 4.0–10.5)
nRBC: 0 % (ref 0.0–0.2)

## 2018-11-30 LAB — LIPID PANEL
Cholesterol: 173 mg/dL (ref 0–200)
HDL: 53 mg/dL (ref >40)
LDL Cholesterol: 103 mg/dL — ABNORMAL HIGH (ref 0–99)
Total CHOL/HDL Ratio: 3.3 RATIO
Triglycerides: 83 mg/dL (ref <150)
VLDL: 17 mg/dL (ref 0–40)

## 2018-11-30 LAB — BASIC METABOLIC PANEL
Anion gap: 11 (ref 5–15)
BUN: 12 mg/dL (ref 8–23)
CO2: 23 mmol/L (ref 22–32)
Calcium: 9 mg/dL (ref 8.9–10.3)
Chloride: 103 mmol/L (ref 98–111)
Creatinine, Ser: 0.9 mg/dL (ref 0.44–1.00)
GFR calc Af Amer: >60 mL/min (ref >60)
GFR calc non Af Amer: >60 mL/min (ref >60)
Glucose, Bld: 180 mg/dL — ABNORMAL HIGH (ref 70–99)
Potassium: 4.3 mmol/L (ref 3.5–5.1)
Sodium: 137 mmol/L (ref 135–145)

## 2018-11-30 LAB — PROTIME-INR
INR: 1.1 (ref 0.8–1.2)
Prothrombin Time: 13.6 seconds (ref 11.4–15.2)

## 2018-11-30 LAB — APTT: aPTT: 26 seconds (ref 24–36)

## 2018-11-30 LAB — HEMOGLOBIN A1C
Hgb A1c MFr Bld: 5.9 % — ABNORMAL HIGH (ref 4.8–5.6)
Mean Plasma Glucose: 122.63 mg/dL

## 2018-11-30 LAB — MRSA PCR SCREENING: MRSA by PCR: NEGATIVE

## 2018-11-30 MED ORDER — ASPIRIN 81 MG PO CHEW
81.0000 mg | CHEWABLE_TABLET | Freq: Every day | ORAL | Status: DC
  Administered 2018-11-30 – 2018-12-02 (×3): 81 mg via ORAL
  Filled 2018-11-30 (×3): qty 1

## 2018-11-30 MED ORDER — CHLORHEXIDINE GLUCONATE CLOTH 2 % EX PADS
6.0000 | MEDICATED_PAD | Freq: Every day | CUTANEOUS | Status: DC
  Administered 2018-12-01: 6 via TOPICAL

## 2018-11-30 NOTE — Progress Notes (Signed)
PT Cancellation Note  Patient Details Name: Deanna [name] MRN: [id number] DOB: [birth year]   Cancelled Treatment:    Reason Eval/Treat Not Completed: Active bedrest order   Ellamae Sia, PT, DPT Acute Rehabilitation Services Pager [phone] Office [phone]    Willy Eddy 11/30/2018, 7:48 AM

## 2018-11-30 NOTE — Progress Notes (Signed)
OT Cancellation Note  Patient Details Name: [name] MRN: [id number] DOB: 1951/02/09   Cancelled Treatment:    Reason Eval/Treat Not Completed: Active bedrest order(Will return as schedule allows. Thank you.)  Sig[place], OTR/L Acute Rehab Pager: [phone] Office: [phone] 11/30/2018, 6:55 AM

## 2018-11-30 NOTE — Progress Notes (Signed)
Referring Physician(s): Dr Rory Percy  Supervising Physician: Luanne Bras  Patient Status:  [facility] - In-pt  Chief Complaint:  CVA Left side weakness;tingling S/P RT common carotid arterriogram followed  By revascularization of occluded Rt MCA inf division with x 1 pass with 27mm x 51mm embotrap retriever device and penumbra aspiration achieving a TICI 2b+(TICI 2c ) revascularization.   Subjective:  This note information gathered by phone  Per RN pt is doing well this am Eating well Up in bed and comfortable Still with decreased sensation on left side Complains of foley discomfort   Allergies: Patient has no known allergies.  Medications: Prior to Admission medications   Medication Sig Start Date End Date Taking? Authorizing Provider  meloxicam (MOBIC) 7.5 MG tablet Take 7.5 mg by mouth daily as needed for muscle spasms.   Yes [provider]  ranitidine (ZANTAC) 150 MG tablet Take 150 mg by mouth daily as needed for heartburn.    Yes [provider]     Vital Signs: BP 138/69    Pulse 100    Temp 99.2 F (37.3 C) (Axillary)    Resp (!) 21    Ht 5\' 1"  (1.549 m)    Wt 245 lb 9.5 oz (111.4 kg)    LMP  (LMP Unknown)    SpO2 96%    BMI 46.40 kg/m    PE per RN: A/O Appropriate Face symmetrical Left side weakness Rt groin NT no bleeding Rt foot with 2+ pulses  Imaging: Ct Angio Head W Or Wo Contrast  Result Date: 11/29/2018 CLINICAL DATA:  Rightward gaze.  Left-sided numbness. EXAM: CT ANGIOGRAPHY HEAD AND NECK CT PERFUSION BRAIN TECHNIQUE: Multidetector CT imaging of the head and neck was performed using the standard protocol during bolus administration of intravenous contrast. Multiplanar CT image reconstructions and MIPs were obtained to evaluate the vascular anatomy. Carotid stenosis measurements (when applicable) are obtained utilizing NASCET criteria, using the distal internal carotid diameter as the denominator. Multiphase CT imaging of the  brain was performed following IV bolus contrast injection. Subsequent parametric perfusion maps were calculated using RAPID software. CONTRAST:  143mL OMNIPAQUE IOHEXOL 350 MG/ML SOLN COMPARISON:  None. FINDINGS: CTA NECK FINDINGS Suboptimal arterial opacification. Aortic arch: Normal variant aortic arch branching pattern with common origin of the brachiocephalic and left common carotid arteries. Widely patent arch vessel origins. Right carotid system: Patent with moderate calcified plaque at the carotid bifurcation. No evidence of significant stenosis or dissection. Retropharyngeal course of the proximal ICA. Left carotid system: Patent with moderate calcified plaque at the carotid bifurcation resulting in less than 50% proximal ICA stenosis. No evidence of dissection. Retropharyngeal course of the proximal ICA. Vertebral arteries: Patent and small bilaterally with the right being particularly hypoplastic. Small vessel size, suboptimal arterial opacification, image noise through the lower neck, and metallic dental streak artifact limits assessment for stenosis in the V1 and V2 segments, particularly on the right. Skeleton: Focally advanced disc degeneration at C5-6 and C6-7 with moderate neural foraminal stenosis due to uncovertebral spurring. Other neck: No evidence of cervical lymphadenopathy or mass. Upper chest: Clear lung apices. Review of the MIP images confirms the above findings CTA HEAD FINDINGS Anterior circulation: The internal carotid arteries are patent from skull base to carotid termini with mild calcified plaque bilaterally not resulting in significant stenosis. The A1 and M1 segments are widely patent. There is proximal occlusion of the right M2 inferior division with minimal intermittent distal reconstitution. No aneurysm is identified. Posterior circulation:  The intracranial vertebral arteries are patent to the basilar and small, right more so than left. Patent left PICA and right AICA origins are  identified. SCA is are also grossly patent. 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 There are predominantly fetal type origins of both PCAs with bilateral PCA branch vessel irregularity and attenuation but no evidence of significant proximal stenosis. No aneurysm is identified. Venous sinuses: Patent. 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 Review of the MIP images confirms the above findings CT Brain Perfusion Findings: ASPECTS: 6 CBF (<30%) Volume: 52mL Perfusion (Tmax>6.0s) volume: 75mL Mismatch Volume: 67mL Infarction Location: Right MCA territory predominantly in the parietal lobe. IMPRESSION: 1. Proximal right M2 occlusion. 2. Associated acute right MCA infarct with penumbra on CTP as above. 3. Moderate cervical carotid artery atherosclerosis without significant stenosis. These results were communicated by telephone to Dr. Rory Percy on 11/29/2018 at 7:53 p.m. Electronically Signed   By: Logan Bores M.D.   On: 11/29/2018 20:37   Ct Angio Neck W Or Wo Contrast  Result Date: 11/29/2018 CLINICAL DATA:  Rightward gaze.  Left-sided numbness. EXAM: CT ANGIOGRAPHY HEAD AND NECK CT PERFUSION BRAIN TECHNIQUE: Multidetector CT imaging of the head and neck was performed using the standard protocol during bolus administration of intravenous contrast. Multiplanar CT image reconstructions and MIPs were obtained to evaluate the vascular anatomy. Carotid stenosis measurements (when applicable) are obtained utilizing NASCET criteria, using the distal internal carotid diameter as the denominator. Multiphase CT imaging of the brain was performed following IV bolus contrast injection. Subsequent parametric perfusion maps were calculated using RAPID software. CONTRAST:  138mL OMNIPAQUE IOHEXOL 350 MG/ML SOLN COMPARISON:  None. FINDINGS: CTA NECK FINDINGS Suboptimal arterial opacification. Aortic arch: Normal variant aortic arch branching pattern with  common origin of the brachiocephalic and left common carotid arteries. Widely patent arch vessel origins. Right carotid system: Patent with moderate calcified plaque at the carotid bifurcation. No evidence of significant stenosis or dissection. Retropharyngeal course of the proximal ICA. Left carotid system: Patent with moderate calcified plaque at the carotid bifurcation resulting in less than 50% proximal ICA stenosis. No evidence of dissection. Retropharyngeal course of the proximal ICA. Vertebral arteries: Patent and small bilaterally with the right being particularly hypoplastic. Small vessel size, suboptimal arterial opacification, image noise through the lower neck, and metallic dental streak artifact limits assessment for stenosis in the V1 and V2 segments, particularly on the right. Skeleton: Focally advanced disc degeneration at C5-6 and C6-7 with moderate neural foraminal stenosis due to uncovertebral spurring. Other neck: No evidence of cervical lymphadenopathy or mass. Upper chest: Clear lung apices. Review of the MIP images confirms the above findings CTA HEAD FINDINGS Anterior circulation: The internal carotid arteries are patent from skull base to carotid termini with mild calcified plaque bilaterally not resulting in significant stenosis. The A1 and M1 segments are widely patent. There is proximal occlusion of the right M2 inferior division with minimal intermittent distal reconstitution. No aneurysm is identified. Posterior circulation: The intracranial vertebral arteries are patent to the basilar and small, right more so than left. Patent left PICA and right AICA origins are identified. SCA is are also grossly patent. 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 There are predominantly fetal type origins of both PCAs with bilateral PCA branch vessel irregularity and attenuation but no evidence of significant proximal stenosis. No aneurysm is identified. Venous sinuses: Patent. 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  Review of the MIP images confirms the above findings CT Brain Perfusion Findings: ASPECTS: 6 CBF (<30%) Volume: 30mL Perfusion (Tmax>6.0s) volume: 23mL Mismatch Volume: 53mL Infarction Location: Right MCA territory predominantly in the parietal lobe. IMPRESSION: 1. Proximal right M2 occlusion. 2. Associated acute right MCA infarct with penumbra on CTP as above. 3. Moderate cervical carotid artery atherosclerosis without significant stenosis. These results were communicated by telephone to Dr. Rory Percy on 11/29/2018 at 7:53 p.m. Electronically Signed   By: Logan Bores M.D.   On: 11/29/2018 20:37   Ct Cerebral Perfusion W Contrast  Result Date: 11/29/2018 CLINICAL DATA:  Rightward gaze.  Left-sided numbness. EXAM: CT ANGIOGRAPHY HEAD AND NECK CT PERFUSION BRAIN TECHNIQUE: Multidetector CT imaging of the head and neck was performed using the standard protocol during bolus administration of intravenous contrast. Multiplanar CT image reconstructions and MIPs were obtained to evaluate the vascular anatomy. Carotid stenosis measurements (when applicable) are obtained utilizing NASCET criteria, using the distal internal carotid diameter as the denominator. Multiphase CT imaging of the brain was performed following IV bolus contrast injection. Subsequent parametric perfusion maps were calculated using RAPID software. CONTRAST:  126mL OMNIPAQUE IOHEXOL 350 MG/ML SOLN COMPARISON:  None. FINDINGS: CTA NECK FINDINGS Suboptimal arterial opacification. Aortic arch: Normal variant aortic arch branching pattern with common origin of the brachiocephalic and left common carotid arteries. Widely patent arch vessel origins. Right carotid system: Patent with moderate calcified plaque at the carotid bifurcation. No evidence of significant stenosis or dissection. Retropharyngeal course of the proximal ICA. Left carotid system: Patent with  moderate calcified plaque at the carotid bifurcation resulting in less than 50% proximal ICA stenosis. No evidence of dissection. Retropharyngeal course of the proximal ICA. Vertebral arteries: Patent and small bilaterally with the right being particularly hypoplastic. Small vessel size, suboptimal arterial opacification, image noise through the lower neck, and metallic dental streak artifact limits assessment for stenosis in the V1 and V2 segments, particularly on the right. Skeleton: Focally advanced disc degeneration at C5-6 and C6-7 with moderate neural foraminal stenosis due to uncovertebral spurring. Other neck: No evidence of cervical lymphadenopathy or mass. Upper chest: Clear lung apices. Review of the MIP images confirms the above findings CTA HEAD FINDINGS Anterior circulation: The internal carotid arteries are patent from skull base to carotid termini with mild calcified plaque bilaterally not resulting in significant stenosis. The A1 and M1 segments are widely patent. There is proximal occlusion of the right M2 inferior division with minimal intermittent distal reconstitution. No aneurysm is identified. Posterior circulation: The intracranial vertebral arteries are patent to the basilar and small, right more so than left. Patent left PICA and right AICA origins are identified. SCA is are also grossly patent. 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 There are predominantly fetal type origins of both PCAs with bilateral PCA branch vessel irregularity and attenuation but no evidence of significant proximal stenosis. No aneurysm is identified. Venous sinuses: Patent. 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 Review of the MIP images confirms the above findings CT Brain Perfusion Findings: ASPECTS: 6 CBF (<30%) Volume: 35mL Perfusion (Tmax>6.0s) volume: 70mL Mismatch Volume: 93mL Infarction Location: Right MCA territory predominantly  in the parietal lobe. IMPRESSION: 1. Proximal right M2 occlusion. 2. Associated acute right MCA infarct with penumbra on CTP as above. 3. Moderate cervical carotid artery atherosclerosis without significant stenosis. These results were communicated by telephone to Dr. Rory Percy on 11/29/2018 at 7:53 p.m. Electronically Signed   By: Logan Bores  M.D.   On: 11/29/2018 20:37   Dg Chest Port 1 View  Result Date: 11/30/2018 CLINICAL DATA:  Elevated temperature. EXAM: PORTABLE CHEST 1 VIEW COMPARISON:  Radiographs of May 02, 2017. FINDINGS: Stable cardiomediastinal silhouette. No pneumothorax or pleural effusion is noted. Minimal bibasilar subsegmental atelectasis is noted. The visualized skeletal structures are unremarkable. IMPRESSION: Minimal bibasilar subsegmental atelectasis. Electronically Signed   By: Marijo Conception M.D.   On: 11/30/2018 11:34   Ct Head Code Stroke Wo Contrast  Result Date: 11/29/2018 CLINICAL DATA:  Code stroke. Rightward gaze. Left-sided tingling. Confusion EXAM: CT HEAD WITHOUT CONTRAST TECHNIQUE: Contiguous axial images were obtained from the base of the skull through the vertex without intravenous contrast. COMPARISON:  None. FINDINGS: Brain: There is loss of gray-white differentiation consistent with acute infarction in the posterior right MCA territory involving the posterior temporal lobe, parietal lobe, and posterior insula. No acute intracranial hemorrhage, mass, midline shift, or extra-axial fluid collection is identified. The ventricles and sulci are within normal limits for age. Vascular: Calcified atherosclerosis at the skull base. Hyperdense right MCA branch vessels in the insula. Skull: No fracture or focal osseous lesion. Sinuses/Orbits: Paranasal sinuses and mastoid air cells are clear. Unremarkable orbits. Other: None. ASPECTS ([place] Stroke Program Early CT Score) - Ganglionic level infarction (caudate, lentiform nuclei, internal capsule, insula, M1-M3 cortex): 4  (insula, M2, and M3 involvement) - Supraganglionic infarction (M4-M6 cortex): 2 (M6 involvement) Total score (0-10 with 10 being normal): 6 IMPRESSION: 1. Acute posterior right MCA infarct with hyperdense right M2 branches. No hemorrhage. 2. ASPECTS is 6. These results were communicated to Dr. Rory Percy at 7:11 pm on 11/29/2018 by text page via the [facility] messaging system. Electronically Signed   By: Logan Bores M.D.   On: 11/29/2018 19:15    Labs:  CBC: Recent Labs    11/29/18 1902 11/29/18 1955 11/30/18 0736  WBC  --  10.0 10.6*  HGB 15.6* 14.7 14.0  HCT 46.0 43.4 40.7  PLT  --  261 286    COAGS: Recent Labs    11/30/18 0736  INR 1.1  APTT 26    BMP: Recent Labs    11/29/18 1902 11/29/18 1955 11/30/18 0736  NA 136 138 137  K 4.2 4.1 4.3  CL 102 101 103  CO2  --  26 23  GLUCOSE 130* 114* 180*  BUN 20 16 12   CALCIUM  --  9.2 9.0  CREATININE 0.90 0.94 0.90  GFRNONAA  --  >60 >60  GFRAA  --  >60 >60    LIVER FUNCTION TESTS: Recent Labs    11/29/18 1955  BILITOT 0.3  AST 21  ALT 19  ALKPHOS 82  PROT 6.1*  ALBUMIN 3.5    Assessment and Plan:  For MRI 7 pm tonight Will report to Dr Estanislado Pandy Dr Estanislado Pandy calling husband with report now IR will follow  Electronically Signed: Lavonia Drafts, PA-C 11/30/2018, 12:33 PM   I spent a total of 15 Minutes at the the patient's bedside AND on the patient's hospital floor or unit, greater than 50% of which was counseling/coordinating care for CVA; R MCA revasc

## 2018-11-30 NOTE — Progress Notes (Signed)
STROKE TEAM PROGRESS NOTE   HISTORY OF PRESENT ILLNESS (per record) Deanna [name] is a 68 y.o. female past medical history of no significant medical issues and on no medications, who comes in via EMS for evaluation of left-sided tingling and right gaze preference that was noted by family around 5:45 PM and according to the husband she was last seen normal around 4:30 PM today on 11/29/2018. She had no preceding illnesses or sicknesses.  No cough cold flulike symptoms fevers chills. Denies any prior history of strokes.  Denies any past medical history for which she is on any medications. Denies any similar symptoms in the past.  Denies any TIA-like episodes. Reports of a minor headache that has been going on. Also reports that there was some odd behavior involving neglect of the left side at home right after it was noticed that she was complaining of the left-sided tingling.  At that time daughter also noticed that she was gazing more to the right and was not looking all the way to the left. At the ER bridge, she was evaluated, NIH stroke scale 7 as documented below. Risks benefits for TPA discussed with her and IV TPA administered. CT angiogram obtained that showed a right M2 occlusion versus severe stenosis as well as a CT perfusion study which was obtained after a repeat attempt due to patient motion and cooperation, that revealed a favorable perfusion profile as documented below  LKW: 4:30 PM on 11/29/2018 tpa given?:  Yes Premorbid modified Rankin scale (mRS):2  Interventional Radiology S/P RT common carotid arterriogram followed  By revascularization of occluded Rt MCA inf division with x 1 pass with 57mm x 53mm embotrap retriever device and penumbra aspiration achieving a TICI 2b+(TICI 2c ) revascularization.   SUBJECTIVE (INTERVAL HISTORY) Her  RN is at the bedside.  Patient is neurologically stable.  She still has right gaze preference, left hemianopsia and mild left-sided weakness and  sensory loss.  Blood pressure adequately controlled on Cardene drip.  She is awaiting MRI today.    OBJECTIVE Vitals:   11/30/18 0645 11/30/18 0700 11/30/18 0800 11/30/18 0900  BP:  (!) 138/56 123/62 124/60  Pulse: (!) 101 (!) 104 100 99  Resp: 14 14 13 17   Temp:   99.2 F (37.3 C)   TempSrc:   Axillary   SpO2: 97% 94% 95% 95%  Weight:      Height:        CBC:  Recent Labs  Lab 11/29/18 1955 11/30/18 0736  WBC 10.0 10.6*  NEUTROABS 6.3 9.3*  HGB 14.7 14.0  HCT 43.4 40.7  MCV 98.4 99.0  PLT 261 130    Basic Metabolic Panel:  Recent Labs  Lab 11/29/18 1955 11/30/18 0736  NA 138 137  K 4.1 4.3  CL 101 103  CO2 26 23  GLUCOSE 114* 180*  BUN 16 12  CREATININE 0.94 0.90  CALCIUM 9.2 9.0    Lipid Panel:     Component Value Date/Time   CHOL 173 11/30/2018 0736   TRIG 83 11/30/2018 0736   HDL 53 11/30/2018 0736   CHOLHDL 3.3 11/30/2018 0736   VLDL 17 11/30/2018 0736   LDLCALC 103 (H) 11/30/2018 0736   HgbA1c:  Lab Results  Component Value Date   HGBA1C 5.9 (H) 11/30/2018   Urine Drug Screen: No results found for: LABOPIA, COCAINSCRNUR, LABBENZ, AMPHETMU, THCU, LABBARB  Alc ohol Level No results found for: ETH  IMAGING  Ct Angio Head W Or Wo Contrast Ct  Angio Neck W Or Wo Contrast Ct Cerebral Perfusion W Contrast 11/29/2018 IMPRESSION:  1. Proximal right M2 occlusion.  2. Associated acute right MCA infarct with penumbra on CTP as above.  3. Moderate cervical carotid artery atherosclerosis without significant stenosis.   Ct Head Code Stroke Wo Contrast 11/29/2018 IMPRESSION:  1. Acute posterior right MCA infarct with hyperdense right M2 branches. No hemorrhage.  2. ASPECTS is 6.   Portable CXR - pending 11/30/18  Interventional Radiology S/P RT common carotid arterriogram followed  By revascularization of occluded Rt MCA inf division with x 1 pass with 67mm x 75mm embotrap retriever device and penumbra aspiration achieving a TICI 2b+(TICI 2c )  revascularization. S.Deveshwar MD   Transthoracic Echocardiogram  00/00/2020 Pending    EKG - SR rate 96 BPM. (See cardiology reading for complete details)    PHYSICAL EXAM Blood pressure 124/60, pulse 99, temperature 99.2 F (37.3 C), temperature source Axillary, resp. rate 17, height 5\' 1"  (1.549 m), weight 111.4 kg, SpO2 95 %. Pleasant middle-age Caucasian lady not in distress.  . Afebrile. Head is nontraumatic. Neck is supple without bruit.    Cardiac exam no murmur or gallop. Lungs are clear to auscultation. Distal pulses are well felt. Neurological Exam : Awake alert oriented to time place and person.  Right gaze preference.  Able to look to the left of midline only.  Dense left homonymous hemianopsia.  Speech is clear without dysarthria or aphasia or apraxia.  Pupils equal reactive.  Fundi not visualized.  Mild left lower facial weakness.  Tongue midline.  Motor system exam reveals no upper or lower extremity drift but mild weakness of left grip and intrinsic hand muscles.  Orbits right over left upper extremity.  Diminished left hemibody sensation to touch and pinprick.  Deep tendon reflexes symmetric.  Plantars are downgoing.  Gait not tested. NIH stroke scale 6    ASSESSMENT/PLAN [name] is a 69 y.o. female with a history of pre diabetes and asthma but on no medications, presenting with left-sided tingling and right gaze preference.   She received IV t-PA Saturday 11/29/18 at [facility].revascularization of occluded Rt MCA inf division with x 1 pass with 66mm x 70mm embotrap retriever device and penumbra aspiration achieving a TICI 2b+(TICI 2c ) revascularization.  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  MRI head - pending  MRA head - not ordered  CTA H&N - Proximal right  M2 occlusion. Associated acute right MCA infarct with penumbra.   Carotid Doppler - CTA neck performed - carotid dopplers not indicated.  2D Echo - pending  [name] Virus 2 - negative  LDL - 103  HgbA1c - 5.9  UDS - not ordered  VTE prophylaxis - SCDs  Diet - NPO  No antithrombotic prior to admission, now on No antithrombotic post tPA  Patient will be counseled to be compliant with her antithrombotic medications  Ongoing aggressive stroke risk factor management  Therapy recommendations:  pending  Disposition:  Pending  Hypertension  Stable - Cleviprex . SBP goal < 160 mm Hg post tPA . Long-term BP goal normotensive  Hyperlipidemia  Lipid lowering medication PTA:  none  LDL 103, goal < 70  Current lipid lowering medication: none - add statin when taking POs  Continue statin at discharge  Interventional  Radiology  S/P RT common carotid arterriogram followed  By revascularization of occluded Rt MCA inf division with x 1 pass with 37mm x 15mm embotrap retriever device and penumbra aspiration achieving a TICI 2b+(TICI 2c ) revascularization.  Other Stroke Risk Factors  Advanced age  Obesity, Body mass index is 46.4 kg/m., recommend weight loss, diet and exercise as appropriate   Family hx stroke - not on file   Other Active Problems  Pre diabetes hx  Mild leukocytosis - 10.6 - temp 99.2 axillary - U/A not c/w UTI - will order CXR   Hospital day # 1  I have personally obtained history,examined this patient, reviewed notes, independently viewed imaging studies, participated in medical decision making and plan of care.ROS completed by me personally and pertinent positives fully documented  I have made any additions or clarifications directly to the above note.  She presented with embolic right MCA infarct and was treated with IV TPA followed by mechanical thrombectomy of occluded inferior division of the right MCA.  She needs close neurological monitoring  and strict blood pressure control as per post interventional protocol.  Resume home medications and use PRN hydralazine and wean off Cardene drip.  Mobilize out of bed.  Physical occupational therapy consults.  Check MRI scan of the brain later today.  I spoke to the patient's husband over the phone and gave him an update about her condition.  She will likely need another day of stay in the ICU and then possibly inpatient rehab depending upon how much help she needs with her gait and balance This patient is critically ill and at significant risk of neurological worsening, death and care requires constant monitoring of vital signs, hemodynamics,respiratory and cardiac monitoring, extensive review of multiple databases, frequent neurological assessment, discussion with family, other specialists and medical decision making of high complexity.I have made any additions or clarifications directly to the above note.This critical care time does not reflect procedure time, or teaching time or supervisory time of PA/NP/Med Resident etc but could involve care discussion time.  I spent 35 minutes of neurocritical care time  in the care of  this patient.     Antony Contras, MD Medical Director [facility] Pager: [phone] 11/30/2018 10:53 AM   To contact Stroke Continuity provider, please refer to [email or web address] After hours, contact General Neurology

## 2018-11-30 NOTE — Progress Notes (Signed)
Called by patient RN for starting ASA. MRI brain with a moderate RMCA stroke confluent peticheal HT on SWI. No big hematoma on FLAIR or T1  OK to start ASA  Stroke team will continue to assess in the AM  -- Amie Portland, MD [facility] Pager: [phone] If 7pm to 7am, please call on call as listed on AMION.

## 2018-11-30 NOTE — Progress Notes (Signed)
  Echocardiogram 2D Echocardiogram has been performed.  Deanna [name] 11/30/2018, 5:11 PM

## 2018-11-30 NOTE — Progress Notes (Signed)
Assisted tele visit to patient with daughter. ° °Alegandra Sommers P, RN  °

## 2018-11-30 NOTE — Progress Notes (Signed)
Patient to 4N25 at 2305, distal pulses palpated/dopplered with PACU RN and R groin site assessed as a level 0. Patient belongings at bedside are one set of eyeglasses.  Candy Sledge, RN

## 2018-12-01 ENCOUNTER — Inpatient Hospital Stay (HOSPITAL_COMMUNITY): Payer: Medicare Other

## 2018-12-01 ENCOUNTER — Encounter (HOSPITAL_COMMUNITY): Payer: Self-pay | Admitting: Interventional Radiology

## 2018-12-01 LAB — ECHOCARDIOGRAM COMPLETE
Height: 61 in
Weight: 3929.48 oz

## 2018-12-01 MED ORDER — PANTOPRAZOLE SODIUM 40 MG PO TBEC
40.0000 mg | DELAYED_RELEASE_TABLET | Freq: Every day | ORAL | Status: DC
  Administered 2018-12-01: 40 mg via ORAL
  Filled 2018-12-01: qty 1

## 2018-12-01 MED ORDER — SODIUM CHLORIDE 0.9 % IV SOLN: INTRAVENOUS | Status: DC

## 2018-12-01 MED ORDER — ONDANSETRON HCL 4 MG/2ML IJ SOLN
4.0000 mg | Freq: Three times a day (TID) | INTRAMUSCULAR | Status: DC | PRN
  Administered 2018-12-01: 10:00:00 4 mg via INTRAVENOUS
  Filled 2018-12-01: qty 2

## 2018-12-01 MED ORDER — SODIUM CHLORIDE 0.9 % IV BOLUS
500.0000 mL | Freq: Once | INTRAVENOUS | Status: AC
  Administered 2018-12-01: 10:00:00 500 mL via INTRAVENOUS

## 2018-12-01 MED ORDER — ATORVASTATIN CALCIUM 40 MG PO TABS
40.0000 mg | ORAL_TABLET | Freq: Every day | ORAL | Status: DC
  Administered 2018-12-01: 19:00:00 40 mg via ORAL
  Filled 2018-12-01: qty 1

## 2018-12-01 MED ORDER — BETHANECHOL CHLORIDE 10 MG PO TABS
5.0000 mg | ORAL_TABLET | Freq: Three times a day (TID) | ORAL | Status: DC
  Administered 2018-12-01 – 2018-12-02 (×4): 5 mg via ORAL
  Filled 2018-12-01 (×4): qty 1

## 2018-12-01 MED ORDER — LABETALOL HCL 5 MG/ML IV SOLN: 20.0000 mg | INTRAVENOUS | Status: DC | PRN

## 2018-12-01 MED ORDER — HYDRALAZINE HCL 20 MG/ML IJ SOLN
20.0000 mg | Freq: Three times a day (TID) | INTRAMUSCULAR | Status: DC | PRN
  Administered 2018-12-01: 09:00:00 20 mg via INTRAVENOUS
  Filled 2018-12-01: qty 1

## 2018-12-01 NOTE — H&P (View-Only) (Signed)
STROKE TEAM PROGRESS NOTE   SUBJECTIVE (INTERVAL HISTORY) Patient is sitting in the bedside chair.  She states she is not feeling well.  She is nauseous.  She is having trouble passing urine and had to be in and out caths several times.  She is also complaining of back pain.  She appears weaker on the left side today.  Blood pressure is adequately controlled.  2D echo shows no significant cardiac source of embolism.Marland Kitchen  MRI shows large posterior division right MCA infarct with slight hemorrhagic transformation   OBJECTIVE Vitals:   12/01/18 0600 12/01/18 0700 12/01/18 0800 12/01/18 0838  BP: (!) 147/75 (!) 143/78  (!) 161/79  Pulse: 85 89    Resp: 15 19    Temp:   98.3 F (36.8 C)   TempSrc:   Oral   SpO2: 94% 95%    Weight:      Height:        CBC:  Recent Labs  Lab 11/29/18 1955 11/30/18 0736  WBC 10.0 10.6*  NEUTROABS 6.3 9.3*  HGB 14.7 14.0  HCT 43.4 40.7  MCV 98.4 99.0  PLT 261 830    Basic Metabolic Panel:  Recent Labs  Lab 11/29/18 1955 11/30/18 0736  NA 138 137  K 4.1 4.3  CL 101 103  CO2 26 23  GLUCOSE 114* 180*  BUN 16 12  CREATININE 0.94 0.90  CALCIUM 9.2 9.0    Lipid Panel:     Component Value Date/Time   CHOL 173 11/30/2018 0736   TRIG 83 11/30/2018 0736   HDL 53 11/30/2018 0736   CHOLHDL 3.3 11/30/2018 0736   VLDL 17 11/30/2018 0736   LDLCALC 103 (H) 11/30/2018 0736   HgbA1c:  Lab Results  Component Value Date   HGBA1C 5.9 (H) 11/30/2018    IMAGING Ct Head Code Stroke Wo Contrast 11/29/2018 1. Acute posterior right MCA infarct with hyperdense right M2 branches. No hemorrhage.  2. ASPECTS is 6.   Ct Angio Head W Or Wo Contrast Ct Angio Neck W Or Wo Contrast Ct Cerebral Perfusion W Contrast 11/29/2018 1. Proximal right M2 occlusion.  2. Associated acute right MCA infarct with penumbra on CTP as above.  3. Moderate cervical carotid artery atherosclerosis without significant stenosis.   Interventional Radiology S/P RT common carotid  arterriogram followed  By revascularization of occluded Rt MCA inf division with x 1 pass with 57mm x 57mm embotrap retriever device and penumbra aspiration achieving a TICI 2b+(TICI 2c ) revascularization. S.Deveshwar MD  Mr Brain Wo Contrast 11/30/2018 1. Moderate-sized acute right MCA infarct with confluent petechial hemorrhage. 2. One or two punctate acute left frontoparietal infarcts. 3. Mild chronic small vessel ischemic disease.    Portable CXR 11/30/18 Minimal bibasilar subsegmental atelectasis.  Transthoracic Echocardiogram  11/30/2018  1. The left ventricle has hyperdynamic systolic function, with an ejection fraction of >65%. The cavity size was normal. Left ventricular diastolic Doppler parameters are consistent with impaired relaxation.  2. The right ventricle has normal systolic function. The cavity was normal.  3. The mitral valve is abnormal. There is moderate mitral annular calcification present.  4. The tricuspid valve is grossly normal.  5. The aortic valve is tricuspid. No stenosis of the aortic valve.  6. The aortic root is normal in size and structure.  7. Vigorous LV systolic function; mild diastolic dysfunction; trace TR.   EKG - SR rate 96 BPM. (See cardiology reading for complete details)   PHYSICAL EXAM  Blood pressure (!) 161/79,  pulse 89, temperature 98.3 F (36.8 C), temperature source Oral, resp. rate 19, height 5\' 1"  (1.549 m), weight 111.4 kg, SpO2 95 %. Pleasant middle-age Caucasian lady not in distress.  . Afebrile. Head is nontraumatic. Neck is supple without bruit.    Cardiac exam no murmur or gallop. Lungs are clear to auscultation. Distal pulses are well felt. Neurological Exam : Awake alert oriented to time place and person.  Right gaze preference.  Able to look to the left past midline .  Dense left homonymous hemianopsia.  Speech is clear with mild dysarthria .no aphasia or apraxia.  Pupils equal reactive.  Fundi not visualized.  Mild left lower  facial weakness.  Tongue midline.  Motor system exam reveals mild left upper extremity drift and moderate weakness of left grip and intrinsic hand muscles.  Orbits right over left upper extremity.  Mild left lower extremity weakness but cooperation is variable diminished left hemibody sensation to touch and pinprick.  Deep tendon reflexes symmetric.  Plantars are downgoing.  Gait not tested.      ASSESSMENT/PLAN Deanna [name] is a 68 y.o. female with a history of pre diabetes and asthma but on no medications, presenting with left-sided tingling and right gaze preference.   She received IV t-PA Saturday 11/29/18 at [facility] TICI2b+ revascularization of occluded Rt MCA   Stroke:  Large Right MCA and 2 punctate L frontotemporal infarcts s/p tPA & IR w/ R MCA revascularization - embolic - etiology unknown  Resultant   left gaze palsy, left hemianopsia and left hemisensory loss  CT head - Acute posterior right MCA infarct with hyperdense right M2 branches.  CTA H&N - Proximal right M2 occlusion.   CT Penumbra - Associated acute right MCA infarct with penumbra.   Cerebral angio TICI2b+ revascularization of occluded Rt MCA inf division with x 1 pass embotrap and penumbra.  Post IR CT no gross hmg, contrast blush present R post frontal parietal region  MRI head -mod R MCA infarct w/ confluent petechial hmg. 2 small L punctate  Frontotemporal infarcts   Repeat stat CT 7/20 stable, no significant change  2D Echo EF > 65%. No source of embolus   Consider TEE and loop for source of stroke. Have tentatively arranged for Tues. Will hold if neuro unstable tomorrow  [name] Virus 2 - negative  LDL - 103  HgbA1c - 5.9  UDS - not ordered  VTE prophylaxis - SCDs  No antithrombotic prior to admission, now on aspirin 81 mg daily post tPA  Therapy recommendations:  pending  Disposition:  Pending  Ok to be OOB  Keep in ICU for 1 more day  Worsening L HP, HA,  Nausea 7/20  Stat CT stable  bolused w/ IVF  Will treat back pain with prns  zofran prn  Hypertension  Stable  Treated with Cleviprex, now off . SBP goal < 160 mm given Hg post tPA . Prn hydralazine for elevated BP . Long-term BP goal normotensive  Hyperlipidemia  Lipid lowering medication PTA:  none  LDL 103, goal < 70  Current lipid lowering medication: add statin lipitor 40  Continue statin at discharge  Pre Diabetes  HgbA1c 5.9, goal < 7.0  Other Stroke Risk Factors  Advanced age  Morbid Obesity, Body mass index is 46.4 kg/m., recommend weight loss, diet and exercise as appropriate   Family hx stroke - not on file  Other Active Problems  Chronic back pain, prn pain control  Mild  leukocytosis - 10.6 - temp 99.2 axillary - U/A not c/w UTI, CXR with subsegmental atx  Urinary retention. I&O cath x 3. Put on urecholine 5 mg tid.    Hospital day # 2  I have personally obtained history,examined this patient, reviewed notes, independently viewed imaging studies, participated in medical decision making and plan of care.ROS completed by me personally and pertinent positives fully documented  I have made any additions or clarifications directly to the above note. The patient appears neurologically slightly worse today this may be multifactorial given her bladder dysfunction, back pain nausea but will check CT scan of the head stat to look for further increase in hemorrhagic transformation.  Tighter control of blood pressure and aim for systolic blood pressure below 160.  IV hydration and given normal saline 500 cc bolus.  Long discussion with patient as well as I spoke to her husband over the phone and answered questions about plan of care.  We will hold off on transferring to the floor today till she is better.  She will need TEE and loop recorder to look for cardiac source of embolism. This patient is critically ill and at significant risk of neurological worsening,  death and care requires constant monitoring of vital signs, hemodynamics,respiratory and cardiac monitoring, extensive review of multiple databases, frequent neurological assessment, discussion with family, other specialists and medical decision making of high complexity.I have made any additions or clarifications directly to the above note.This critical care time does not reflect procedure time, or teaching time or supervisory time of PA/NP/Med Resident etc but could involve care discussion time.  I spent 30 minutes of neurocritical care time  in the care of  this patient.     Antony Contras, MD Medical Director [place] Pager: [phone] 12/01/2018 2:20 PM   Antony Contras, MD Medical Director [place] Pager: [phone] 12/01/2018 10:02 AM   To contact Stroke Continuity provider, please refer to [email or web address] After hours, contact General Neurology

## 2018-12-01 NOTE — Progress Notes (Signed)
STROKE TEAM PROGRESS NOTE   SUBJECTIVE (INTERVAL HISTORY) Patient is sitting in the bedside chair.  She states she is not feeling well.  She is nauseous.  She is having trouble passing urine and had to be in and out caths several times.  She is also complaining of back pain.  She appears weaker on the left side today.  Blood pressure is adequately controlled.  2D echo shows no significant cardiac source of embolism.Marland Kitchen  MRI shows large posterior division right MCA infarct with slight hemorrhagic transformation   OBJECTIVE Vitals:   12/01/18 0600 12/01/18 0700 12/01/18 0800 12/01/18 0838  BP: (!) 147/75 (!) 143/78  (!) 161/79  Pulse: 85 89    Resp: 15 19    Temp:   98.3 F (36.8 C)   TempSrc:   Oral   SpO2: 94% 95%    Weight:      Height:        CBC:  Recent Labs  Lab 11/29/18 1955 11/30/18 0736  WBC 10.0 10.6*  NEUTROABS 6.3 9.3*  HGB 14.7 14.0  HCT 43.4 40.7  MCV 98.4 99.0  PLT 261 017    Basic Metabolic Panel:  Recent Labs  Lab 11/29/18 1955 11/30/18 0736  NA 138 137  K 4.1 4.3  CL 101 103  CO2 26 23  GLUCOSE 114* 180*  BUN 16 12  CREATININE 0.94 0.90  CALCIUM 9.2 9.0    Lipid Panel:     Component Value Date/Time   CHOL 173 11/30/2018 0736   TRIG 83 11/30/2018 0736   HDL 53 11/30/2018 0736   CHOLHDL 3.3 11/30/2018 0736   VLDL 17 11/30/2018 0736   LDLCALC 103 (H) 11/30/2018 0736   HgbA1c:  Lab Results  Component Value Date   HGBA1C 5.9 (H) 11/30/2018    IMAGING Ct Head Code Stroke Wo Contrast 11/29/2018 1. Acute posterior right MCA infarct with hyperdense right M2 branches. No hemorrhage.  2. ASPECTS is 6.   Ct Angio Head W Or Wo Contrast Ct Angio Neck W Or Wo Contrast Ct Cerebral Perfusion W Contrast 11/29/2018 1. Proximal right M2 occlusion.  2. Associated acute right MCA infarct with penumbra on CTP as above.  3. Moderate cervical carotid artery atherosclerosis without significant stenosis.   Interventional Radiology S/P RT common carotid  arterriogram followed  By revascularization of occluded Rt MCA inf division with x 1 pass with 106mm x 89mm embotrap retriever device and penumbra aspiration achieving a TICI 2b+(TICI 2c ) revascularization. S.Deveshwar MD  Mr Brain Wo Contrast 11/30/2018 1. Moderate-sized acute right MCA infarct with confluent petechial hemorrhage. 2. One or two punctate acute left frontoparietal infarcts. 3. Mild chronic small vessel ischemic disease.    Portable CXR 11/30/18 Minimal bibasilar subsegmental atelectasis.  Transthoracic Echocardiogram  11/30/2018  1. The left ventricle has hyperdynamic systolic function, with an ejection fraction of >65%. The cavity size was normal. Left ventricular diastolic Doppler parameters are consistent with impaired relaxation.  2. The right ventricle has normal systolic function. The cavity was normal.  3. The mitral valve is abnormal. There is moderate mitral annular calcification present.  4. The tricuspid valve is grossly normal.  5. The aortic valve is tricuspid. No stenosis of the aortic valve.  6. The aortic root is normal in size and structure.  7. Vigorous LV systolic function; mild diastolic dysfunction; trace TR.   EKG - SR rate 96 BPM. (See cardiology reading for complete details)   PHYSICAL EXAM  Blood pressure (!) 161/79,  pulse 89, temperature 98.3 F (36.8 C), temperature source Oral, resp. rate 19, height 5\' 1"  (1.549 m), weight 111.4 kg, SpO2 95 %. Pleasant middle-age Caucasian lady not in distress.  . Afebrile. Head is nontraumatic. Neck is supple without bruit.    Cardiac exam no murmur or gallop. Lungs are clear to auscultation. Distal pulses are well felt. Neurological Exam : Awake alert oriented to time place and person.  Right gaze preference.  Able to look to the left past midline .  Dense left homonymous hemianopsia.  Speech is clear with mild dysarthria .no aphasia or apraxia.  Pupils equal reactive.  Fundi not visualized.  Mild left lower  facial weakness.  Tongue midline.  Motor system exam reveals mild left upper extremity drift and moderate weakness of left grip and intrinsic hand muscles.  Orbits right over left upper extremity.  Mild left lower extremity weakness but cooperation is variable diminished left hemibody sensation to touch and pinprick.  Deep tendon reflexes symmetric.  Plantars are downgoing.  Gait not tested.      ASSESSMENT/PLAN [name] is a 68 y.o. female with a history of pre diabetes and asthma but on no medications, presenting with left-sided tingling and right gaze preference.   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  CTA H&N - Proximal right M2 occlusion.   CT Penumbra - Associated acute right MCA infarct with penumbra.   Cerebral angio TICI2b+ revascularization of occluded Rt MCA inf division with x 1 pass embotrap and penumbra.  Post IR CT no gross hmg, contrast blush present R post frontal parietal region  MRI head -mod R MCA infarct w/ confluent petechial hmg. 2 small L punctate  Frontotemporal infarcts   Repeat stat CT 7/20 stable, no significant change  2D Echo EF > 65%. No source of embolus   Consider TEE and loop for source of stroke. Have tentatively arranged for Tues. Will hold if neuro unstable tomorrow  [name] Virus 2 - negative  LDL - 103  HgbA1c - 5.9  UDS - not ordered  VTE prophylaxis - SCDs  No antithrombotic prior to admission, now on aspirin 81 mg daily post tPA  Therapy recommendations:  pending  Disposition:  Pending  Ok to be OOB  Keep in ICU for 1 more day  Worsening L HP, HA,  Nausea 7/20  Stat CT stable  bolused w/ IVF  Will treat back pain with prns  zofran prn  Hypertension  Stable  Treated with Cleviprex, now off . SBP goal < 160 mm given Hg post tPA . Prn hydralazine for elevated BP . Long-term BP goal normotensive  Hyperlipidemia  Lipid lowering medication PTA:  none  LDL 103, goal < 70  Current lipid lowering medication: add statin lipitor 40  Continue statin at discharge  Pre Diabetes  HgbA1c 5.9, goal < 7.0  Other Stroke Risk Factors  Advanced age  Morbid Obesity, Body mass index is 46.4 kg/m., recommend weight loss, diet and exercise as appropriate   Family hx stroke - not on file  Other Active Problems  Chronic back pain, prn pain control  Mild  leukocytosis - 10.6 - temp 99.2 axillary - U/A not c/w UTI, CXR with subsegmental atx  Urinary retention. I&O cath x 3. Put on urecholine 5 mg tid.    Hospital day # 2  I have personally obtained history,examined this patient, reviewed notes, independently viewed imaging studies, participated in medical decision making and plan of care.ROS completed by me personally and pertinent positives fully documented  I have made any additions or clarifications directly to the above note. The patient appears neurologically slightly worse today this may be multifactorial given her bladder dysfunction, back pain nausea but will check CT scan of the head stat to look for further increase in hemorrhagic transformation.  Tighter control of blood pressure and aim for systolic blood pressure below 160.  IV hydration and given normal saline 500 cc bolus.  Long discussion with patient as well as I spoke to her husband over the phone and answered questions about plan of care.  We will hold off on transferring to the floor today till she is better.  She will need TEE and loop recorder to look for cardiac source of embolism. This patient is critically ill and at significant risk of neurological worsening,  death and care requires constant monitoring of vital signs, hemodynamics,respiratory and cardiac monitoring, extensive review of multiple databases, frequent neurological assessment, discussion with family, other specialists and medical decision making of high complexity.I have made any additions or clarifications directly to the above note.This critical care time does not reflect procedure time, or teaching time or supervisory time of PA/NP/Med Resident etc but could involve care discussion time.  I spent 30 minutes of neurocritical care time  in the care of  this patient.     Antony Contras, MD Medical Director [place] Pager: [phone] 12/01/2018 2:20 PM   Antony Contras, MD Medical Director [place] Pager: [phone] 12/01/2018 10:02 AM   To contact Stroke Continuity provider, please refer to [email or web address] After hours, contact General Neurology

## 2018-12-01 NOTE — Evaluation (Signed)
Occupational Therapy Evaluation Patient Details Name: Deanna [name] MRN: [id number] DOB: 1950/06/04 Today's Date: 12/01/2018    History of Present Illness Patient is a 68 y/o female who presents with left sided weakness, neglect and right gaze preference. NIH:7. 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 s/p tPA. PMH includes pre diabetes.   Clinical Impression   PT admitted with R MCA. Pt currently with functional limitiations due to the deficits listed below (see OT problem list). Pt currently requires Total+2 Max (A) with weight shifting to help advance to chair. Pt with L visual field deficits with cognitive deficits.  Pt will benefit from skilled OT to increase their independence and safety with adls and balance to allow discharge  CIR.     Follow Up Recommendations  CIR    Equipment Recommendations  3 in 1 bedside commode    Recommendations for Other Services Rehab consult     Precautions / Restrictions Precautions Precautions: Fall Precaution Comments: left neglect Restrictions Weight Bearing Restrictions: No      Mobility Bed Mobility               General bed mobility comments: sitting on BSC on arrival  Transfers Overall transfer level: Needs assistance Equipment used: 2 person hand held assist Transfers: Sit to/from Stand Sit to Stand: Max assist;+2 physical assistance         General transfer comment: Assist of 2 to power to standing with difficulty with initiation.    Balance Overall balance assessment: Needs assistance         Standing balance support: Bilateral upper extremity supported;During functional activity Standing balance-Leahy Scale: Poor                             ADL either performed or assessed with clinical judgement   ADL Overall ADL's : Needs  assistance/impaired Eating/Feeding: Minimal assistance;Sitting Eating/Feeding Details (indicate cue type and reason): needs mod cues to scan the entire tray and locate all the times. pt able to recall a tray is square and locate all 4 corners Grooming: Wash/dry hands;Set up Grooming Details (indicate cue type and reason): provided hand sanitizer                 Toilet Transfer: +2 for physical assistance;+2 for safety/equipment;Maximal assistance             General ADL Comments: pt on BSC on arrival and transfered to chair for eating breakfast. pt reports sitting int he chair will help her back     Vision Baseline Vision/History: Wears glasses Wears Glasses: At all times Vision Assessment?: Yes Ocular Range of Motion: Impaired-to be further tested in functional context Alignment/Gaze Preference: Gaze right Tracking/Visual Pursuits: Impaired - to be further tested in functional context Visual Fields: Left visual field deficit Additional Comments: pt closing R eye when attempting to look at objects in detail. Pt needs cues to turn head to locate objects on L visual field      Perception     Praxis      Pertinent Vitals/Pain Pain Assessment: Faces Faces Pain Scale: Hurts whole lot Pain Location: back with movement Pain Descriptors / Indicators: Sore;Grimacing;Guarding Pain Intervention(s): Repositioned;Monitored during session;Limited activity within patient's tolerance     Hand Dominance Right   Extremity/Trunk Assessment Upper Extremity Assessment Upper Extremity Assessment: LUE deficits/detail LUE  Deficits / Details: decrease sensation, able to move FULL ROM LUE Coordination: decreased fine motor;decreased gross motor   Lower Extremity Assessment Lower Extremity Assessment: Defer to PT evaluation LLE Deficits / Details: Grossly ~3+ - 4/5 throughout. LLE Sensation: decreased light touch;decreased proprioception   Cervical / Trunk Assessment Cervical / Trunk  Assessment: Other exceptions Cervical / Trunk Exceptions: back pain/issues   Communication Communication Communication: No difficulties   Cognition Arousal/Alertness: Awake/alert Behavior During Therapy: WFL for tasks assessed/performed Overall Cognitive Status: Impaired/Different from baseline Area of Impairment: Awareness;Problem solving;Safety/judgement                         Safety/Judgement: Decreased awareness of deficits;Decreased awareness of safety Awareness: Intellectual Problem Solving: Decreased initiation;Difficulty sequencing;Requires verbal cues;Requires tactile cues General Comments: lack of awareness to deficits. Pt report could do it if back didnt hurt   General Comments       Exercises     Shoulder Instructions      Home Living Family/patient expects to be discharged to:: Private residence Living Arrangements: Spouse/significant other Available Help at Discharge: Family;Available 24 hours/day Type of Home: House Home Access: Stairs to enter [employer] of Steps: 2 Entrance Stairs-Rails: Right Home Layout: One level     Bathroom Shower/Tub: Occupational psychologist: Handicapped height         Additional Comments: spouse and x2 daughters to (A)       Prior Functioning/Environment Level of Independence: Independent        Comments: Loves to cook and clean, drives . Has daughters who can help at home as well.        OT Problem List: Decreased strength;Decreased activity tolerance;Impaired balance (sitting and/or standing);Decreased knowledge of use of DME or AE;Decreased knowledge of precautions;Pain;Impaired UE functional use;Decreased safety awareness;Decreased cognition;Decreased coordination;Impaired vision/perception;Decreased range of motion;Impaired sensation;Obesity      OT Treatment/Interventions: Self-care/ADL training;Therapeutic exercise;Neuromuscular education;Energy conservation;DME and/or AE  instruction;Manual therapy;Therapeutic activities;Cognitive remediation/compensation;Visual/perceptual remediation/compensation;Patient/family education;Balance training    OT Goals(Current goals can be found in the care plan section) Acute Rehab OT Goals Patient Stated Goal: to get up to help her back OT Goal Formulation: With patient Time For Goal Achievement: 12/15/18 Potential to Achieve Goals: Good  OT Frequency: Min 3X/week   Barriers to D/C:            Co-evaluation PT/OT/SLP Co-Evaluation/Treatment: Yes Reason for Co-Treatment: For patient/therapist safety;To address functional/ADL transfers PT goals addressed during session: Mobility/safety with mobility;Balance OT goals addressed during session: ADL's and self-care;Proper use of Adaptive equipment and DME;Strengthening/ROM      AM-PAC OT "6 Clicks" Daily Activity     Outcome Measure Help from another person eating meals?: A Little Help from another person taking care of personal grooming?: A Lot Help from another person toileting, which includes using toliet, bedpan, or urinal?: A Lot Help from another person bathing (including washing, rinsing, drying)?: A Lot Help from another person to put on and taking off regular upper body clothing?: A Little Help from another person to put on and taking off regular lower body clothing?: A Lot 6 Click Score: 14   End of Session Equipment Utilized During Treatment: Gait belt Nurse Communication: Mobility status;Precautions  Activity Tolerance: Patient limited by pain Patient left: in chair;with call bell/phone within reach;with chair alarm set  OT Visit Diagnosis: Unsteadiness on feet (R26.81);Muscle weakness (generalized) (M62.81)                Time:  5488-3014(1 mins additioanl with patient for OT ) OT Time Calculation (min): 18 min Charges:  OT General Charges $OT Visit: 1 Visit OT Evaluation $OT Eval Moderate Complexity: 1 Mod   Jeri Modena, OTR/L  Acute  Rehabilitation Services Pager: [phone] Office: [phone] .   Jeri Modena 12/01/2018, 10:59 AM

## 2018-12-01 NOTE — Anesthesia Postprocedure Evaluation (Signed)
Anesthesia Post Note  Patient: Deanna [name]  Procedure(s) Performed: RADIOLOGY WITH ANESTHESIA (N/A )     Patient location during evaluation: PACU Anesthesia Type: General Level of consciousness: awake and patient cooperative Pain management: pain level controlled Vital Signs Assessment: post-procedure vital signs reviewed and stable Respiratory status: spontaneous breathing, nonlabored ventilation, respiratory function stable and patient connected to nasal cannula oxygen Cardiovascular status: stable Postop Assessment: no apparent nausea or vomiting Anesthetic complications: no    Last Vitals:  Vitals:   12/01/18 0600 12/01/18 0700  BP: (!) 147/75 (!) 143/78  Pulse: 85 89  Resp: 15 19  Temp:    SpO2: 94% 95%    Last Pain:  Vitals:   12/01/18 0400  TempSrc: Oral  PainSc: Asleep                 Verlan Grotz

## 2018-12-01 NOTE — Plan of Care (Signed)
  Problem: Education: Goal: Knowledge of secondary prevention will improve Outcome: Progressing   Problem: Coping: Goal: Will verbalize positive feelings about self Outcome: Progressing Goal: Will identify appropriate support needs Outcome: Progressing   Problem: Health Behavior/Discharge Planning: Goal: Ability to manage health-related needs will improve Outcome: Progressing   Problem: Self-Care: Goal: Verbalization of feelings and concerns over difficulty with self-care will improve Outcome: Progressing Goal: Ability to communicate needs accurately will improve Outcome: Progressing   Problem: Nutrition: Goal: Risk of aspiration will decrease Outcome: Progressing Goal: Dietary intake will improve Outcome: Progressing

## 2018-12-01 NOTE — Evaluation (Signed)
Physical Therapy Evaluation Patient Details Name: Deanna [name] MRN: [id number] DOB: 1951-05-08 Today's Date: 12/01/2018   History of Present Illness  Patient is a 68 y/o female who presents with left sided weakness, neglect and right gaze preference. NIH:7. 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 s/p tPA. PMH includes pre diabetes.  Clinical Impression  Patient presents with left inattention, right gaze preference, left sided weakness, impaired problem solving, decreased awareness of safety/deficits and pain s/p above. Pt independent PTA and lives with spouse. Today, pt requires assist of 2 for bed mobility and transfers. Noted to have difficulty with initiation and sequencing. Pt able to gaze left with max cues but not sustain. Reports she cannot walk due to back pain, poor awareness of deficits from stroke. Highly motivated to return to PLOF. Would benefit from CIR to maximize independence and mobility prior to return home. Will follow acutely.    Follow Up Recommendations CIR;Supervision for mobility/OOB;Supervision/Assistance - 24 hour    Equipment Recommendations  Other (comment)(defer)    Recommendations for Other Services Rehab consult     Precautions / Restrictions Precautions Precautions: Fall Precaution Comments: left neglect Restrictions Weight Bearing Restrictions: No      Mobility  Bed Mobility Overal bed mobility: Needs Assistance Bed Mobility: Rolling;Sidelying to Sit Rolling: Mod assist Sidelying to sit: Mod assist;HOB elevated       General bed mobility comments: Able to bring LLE to EOB only needing assist for last little bit; assist with scooting bottom and elevating trunk. Heavy use of rail. Cues to push up through RUE.  Transfers Overall transfer level: Needs assistance Equipment used: 2 person hand held  assist Transfers: Sit to/from [facility] Sit to Stand: Max assist;+2 physical assistance Stand pivot transfers: Mod assist;+2 physical assistance;+2 safety/equipment       General transfer comment: Assist of 2 to power to standing- difficulty with initiation. Stood from [employer], from [facility] x1. SPT bed to [facility] with assist for weightshifting and momentum.  Ambulation/Gait Ambulation/Gait assistance: Mod assist;+2 physical assistance;+2 safety/equipment Gait Distance (Feet): 3 Feet Assistive device: 2 person hand held assist Gait Pattern/deviations: Trunk flexed;Step-to pattern Gait velocity: decreased   General Gait Details: Able to take a few steps to get to chair with assist for weight shifting and momentum to move towards right. Poor sequencing. Poor awareness of left side of body; flexed posture.  Stairs            Wheelchair Mobility    Modified Rankin (Stroke Patients Only) Modified Rankin (Stroke Patients Only) Pre-Morbid Rankin Score: No symptoms Modified Rankin: Moderately severe disability     Balance Overall balance assessment: Needs assistance Sitting-balance support: Feet supported;Single extremity supported Sitting balance-Leahy Scale: Fair Sitting balance - Comments: Able to sit with 1 UE support and close min guard for safety. Reports back pain.   Standing balance support: Bilateral upper extremity supported;During functional activity Standing balance-Leahy Scale: Poor Standing balance comment: External support for standing balance; no knee buckling but poor awareness of left side.                             Pertinent Vitals/Pain Pain Assessment: Faces Faces Pain Scale: Hurts whole lot Pain Location: back with movement Pain Descriptors / Indicators: Sore;Grimacing;Guarding Pain Intervention(s): Repositioned;Monitored during session;Limited activity within patient's tolerance    Home Living  Family/patient expects to be  discharged to:: Private residence Living Arrangements: Spouse/significant other Available Help at Discharge: Family;Available 24 hours/day Type of Home: House Home Access: Stairs to enter Entrance Stairs-Rails: Right Entrance Stairs-Number of Steps: 2 Home Layout: One level   Additional Comments: spouse and x2 daughters to (A)     Prior Function Level of Independence: Independent         Comments: Loves to cook and clean, drives . Has daughters who can help at home as well.     Hand Dominance   Dominant Hand: Right    Extremity/Trunk Assessment   Upper Extremity Assessment Upper Extremity Assessment: Defer to OT evaluation LUE Deficits / Details: decrease sensation, able to move FULL ROM LUE Coordination: decreased fine motor;decreased gross motor    Lower Extremity Assessment Lower Extremity Assessment: LLE deficits/detail LLE Deficits / Details: Grossly ~3+ - 4/5 throughout. LLE Sensation: decreased light touch;decreased proprioception    Cervical / Trunk Assessment Cervical / Trunk Assessment: Other exceptions Cervical / Trunk Exceptions: back pain/issues  Communication   Communication: No difficulties  Cognition Arousal/Alertness: Awake/alert Behavior During Therapy: WFL for tasks assessed/performed Overall Cognitive Status: Impaired/Different from baseline Area of Impairment: Awareness;Problem solving;Safety/judgement                         Safety/Judgement: Decreased awareness of deficits;Decreased awareness of safety Awareness: Intellectual Problem Solving: Decreased initiation;Difficulty sequencing;Requires verbal cues;Requires tactile cues General Comments: lack of awareness to deficits. Pt report could do it if back didnt hurt. Right gaze preference. Able to get to midline and gaze left on a few occasions with max cues.      General Comments General comments (skin integrity, edema, etc.): VSS throughout.    Exercises      Assessment/Plan    PT Assessment Patient needs continued PT services  PT Problem List Decreased strength;Decreased mobility;Decreased safety awareness;Decreased cognition;Pain;Impaired sensation;Decreased balance;Decreased activity tolerance       PT Treatment Interventions DME instruction;Therapeutic activities;Gait training;Therapeutic exercise;Patient/family education;Balance training;Functional mobility training;Neuromuscular re-education;Stair training;Cognitive remediation    PT Goals (Current goals can be found in the Care Plan section)  Acute Rehab PT Goals Patient Stated Goal: to get up to help her back; be independent PT Goal Formulation: With patient Time For Goal Achievement: 12/15/18 Potential to Achieve Goals: Good    Frequency Min 4X/week   Barriers to discharge        Co-evaluation   Reason for Co-Treatment: For patient/therapist safety;To address functional/ADL transfers   OT goals addressed during session: ADL's and self-care;Proper use of Adaptive equipment and DME;Strengthening/ROM       AM-PAC PT "6 Clicks" Mobility  Outcome Measure Help needed turning from your back to your side while in a flat bed without using bedrails?: A Lot Help needed moving from lying on your back to sitting on the side of a flat bed without using bedrails?: A Lot Help needed moving to and from a bed to a chair (including a wheelchair)?: Total Help needed standing up from a chair using your arms (e.g., wheelchair or bedside chair)?: Total Help needed to walk in hospital room?: Total Help needed climbing 3-5 steps with a railing? : Total 6 Click Score: 8    End of Session Equipment Utilized During Treatment: Gait belt Activity Tolerance: Patient tolerated treatment well Patient left: with call bell/phone within reach;with bed alarm set;in chair;with chair alarm set Nurse Communication: Mobility status PT Visit Diagnosis: Other abnormalities of gait and mobility  (  R26.89);Unsteadiness on feet (R26.81);Difficulty in walking, not elsewhere classified (R26.2);Pain Pain - part of body: (back)    Time: 5015-8682 PT Time Calculation (min) (ACUTE ONLY): 37 min   Charges:   PT Evaluation $PT Eval Moderate Complexity: 1 Mod          Wray Kearns, PT, DPT Acute Rehabilitation Services Pager [phone] Office [phone]      Marguarite Arbour A Sabra Heck 12/01/2018, 12:28 PM

## 2018-12-01 NOTE — Progress Notes (Signed)
Rehab Admissions Coordinator Note:  Per OT recommendation, patient was screened by Michel Santee, PT, DPT for appropriateness for an Inpatient Acute Rehab Consult.  At this time, we are recommending Inpatient Rehab consult.  Please place IP Rehab Consult order if pt would like to be considered.   Michel Santee, PT, DPT 12/01/2018, 11:48 AM  I can be reached at [phone].

## 2018-12-01 NOTE — Progress Notes (Signed)
Assisted tele visit to patient with family member.  Morenike Cuff P, RN  

## 2018-12-01 NOTE — Progress Notes (Signed)
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 Estanislado Pandy. Patient awake and alert laying in bed with no complaints at this time. RN reports worsening of symptoms (headache, more lethargic, worsening dysarthria/facial droop) this AM. Right groin incision c/d/i.  CT head this AM: 1. Redemonstrated large edematous infarction of the posterior right MCA territory, edema and hypodensity substantially increased compared to prior examination dated 11/29/2018. 2. There is new hemorrhagic conversion anteriorly, noted in the deep white matter of the posterior frontal and temporal lobes about the insula (series 7, image 15). 3. No significant midline shift or evidence of downward herniation.   Allergies: Patient has no known allergies.  Medications: Prior to Admission medications   Medication Sig Start Date End Date Taking? Authorizing Provider  meloxicam (MOBIC) 7.5 MG tablet Take 7.5 mg by mouth daily as needed for muscle spasms.   Yes [provider]  ranitidine (ZANTAC) 150 MG tablet Take 150 mg by mouth daily as needed for heartburn.    Yes [provider]     Vital Signs: BP 122/69    Pulse 84    Temp 98.3 F (36.8 C) (Oral)    Resp 13    Ht 5\' 1"  (1.549 m)    Wt 245 lb 9.5 oz (111.4 kg)    LMP  (LMP Unknown)    SpO2 96%    BMI 46.40 kg/m   Physical Exam Vitals signs and nursing note reviewed.  Constitutional:      General: She is not in acute distress.    Appearance: Normal appearance.  Pulmonary:     Effort: Pulmonary effort is normal. No respiratory distress.  Skin:    General: Skin is warm and dry.     Comments: Right groin incision soft without active bleeding or hematoma.  Neurological:     Mental Status: She is  alert.     Comments: Lethargic but responds to voice and follows simple commands. Dysarthric. PERRL bilaterally. Demonstrates right gaze preference. Demonstrates left facial droop. Tongue midline. Can spontaneously move all extremities. No pronator drift. Distal pulses 2+ bilaterally.     Imaging: Ct Angio Head W Or Wo Contrast  Result Date: 11/29/2018 CLINICAL DATA:  Rightward gaze.  Left-sided numbness. EXAM: CT ANGIOGRAPHY HEAD AND NECK CT PERFUSION BRAIN TECHNIQUE: Multidetector CT imaging of the head and neck was performed using the standard protocol during bolus administration of intravenous contrast. Multiplanar CT image reconstructions and MIPs were obtained to evaluate the vascular anatomy. Carotid stenosis measurements (when applicable) are obtained utilizing NASCET criteria, using the distal internal carotid diameter as the denominator. Multiphase CT imaging of the brain was performed following IV bolus contrast injection. Subsequent parametric perfusion maps were calculated using RAPID software. CONTRAST:  153mL OMNIPAQUE IOHEXOL 350 MG/ML SOLN COMPARISON:  None. FINDINGS: CTA NECK FINDINGS Suboptimal arterial opacification. Aortic arch: Normal variant aortic arch branching pattern with common origin of the brachiocephalic and left common carotid arteries. Widely patent arch vessel origins. Right carotid system: Patent with moderate calcified plaque at the carotid bifurcation. No evidence of significant stenosis or dissection. Retropharyngeal course of the proximal ICA. Left carotid system: Patent with moderate calcified plaque at the carotid bifurcation resulting in less than 50% proximal ICA stenosis. No evidence of dissection. Retropharyngeal course of the proximal ICA. Vertebral arteries:  Patent and small bilaterally with the right being particularly hypoplastic. Small vessel size, suboptimal arterial opacification, image noise through the lower neck, and metallic dental streak  artifact limits assessment for stenosis in the V1 and V2 segments, particularly on the right. Skeleton: Focally advanced disc degeneration at C5-6 and C6-7 with moderate neural foraminal stenosis due to uncovertebral spurring. Other neck: No evidence of cervical lymphadenopathy or mass. Upper chest: Clear lung apices. Review of the MIP images confirms the above findings CTA HEAD FINDINGS Anterior circulation: The internal carotid arteries are patent from skull base to carotid termini with mild calcified plaque bilaterally not resulting in significant stenosis. The A1 and M1 segments are widely patent. There is proximal occlusion of the right M2 inferior division with minimal intermittent distal reconstitution. No aneurysm is identified. Posterior circulation: The intracranial vertebral arteries are patent to the basilar and small, right more so than left. Patent left PICA and right AICA origins are identified. SCA is are also grossly patent. 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 There are predominantly fetal type origins of both PCAs with bilateral PCA branch vessel irregularity and attenuation but no evidence of significant proximal stenosis. No aneurysm is identified. Venous sinuses: Patent. 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 Review of the MIP images confirms the above findings CT Brain Perfusion Findings: ASPECTS: 6 CBF (<30%) Volume: 59mL Perfusion (Tmax>6.0s) volume: 27mL Mismatch Volume: 38mL Infarction Location: Right MCA territory predominantly in the parietal lobe. IMPRESSION: 1. Proximal right M2 occlusion. 2. Associated acute right MCA infarct with penumbra on CTP as above. 3. Moderate cervical carotid artery atherosclerosis without significant stenosis. These results were communicated by telephone to Dr. Rory Percy on 11/29/2018 at 7:53 p.m. Electronically Signed   By: Logan Bores M.D.   On: 11/29/2018 20:37   Ct Head Wo  Contrast  Result Date: 12/01/2018 CLINICAL DATA:  Altered mental status, increased left-sided facial drooping, headache, aphasia EXAM: CT HEAD WITHOUT CONTRAST TECHNIQUE: Contiguous axial images were obtained from the base of the skull through the vertex without intravenous contrast. COMPARISON:  MR brain, 11/30/2018, CT brain, 11/29/2018 FINDINGS: Brain: Redemonstrated large edematous infarction of the posterior right MCA territory, edema and hypodensity substantially increased compared to prior examination dated 11/29/2018. There is new hemorrhagic conversion anteriorly, noted in the deep white matter of the posterior frontal and temporal lobes about the insula (series 7, image 15). No significant midline shift or evidence of downward herniation. Vascular: No hyperdense vessel or unexpected calcification. Skull: Normal. Negative for fracture or focal lesion. Sinuses/Orbits: No acute finding. Other: None. IMPRESSION: 1. Redemonstrated large edematous infarction of the posterior right MCA territory, edema and hypodensity substantially increased compared to prior examination dated 11/29/2018. 2. There is new hemorrhagic conversion anteriorly, noted in the deep white matter of the posterior frontal and temporal lobes about the insula (series 7, image 15). 3.  No significant midline shift or evidence of downward herniation. Electronically Signed   By: Eddie Candle M.D.   On: 12/01/2018 11:22   Ct Angio Neck W Or Wo Contrast  Result Date: 11/29/2018 CLINICAL DATA:  Rightward gaze.  Left-sided numbness. EXAM: CT ANGIOGRAPHY HEAD AND NECK CT PERFUSION BRAIN TECHNIQUE: Multidetector CT imaging of the head and neck was performed using the standard protocol during bolus administration of intravenous contrast. Multiplanar CT image reconstructions and MIPs were obtained to evaluate the vascular anatomy. Carotid stenosis measurements (when applicable) are obtained utilizing NASCET criteria, using the distal internal  carotid  diameter as the denominator. Multiphase CT imaging of the brain was performed following IV bolus contrast injection. Subsequent parametric perfusion maps were calculated using RAPID software. CONTRAST:  182mL OMNIPAQUE IOHEXOL 350 MG/ML SOLN COMPARISON:  None. FINDINGS: CTA NECK FINDINGS Suboptimal arterial opacification. Aortic arch: Normal variant aortic arch branching pattern with common origin of the brachiocephalic and left common carotid arteries. Widely patent arch vessel origins. Right carotid system: Patent with moderate calcified plaque at the carotid bifurcation. No evidence of significant stenosis or dissection. Retropharyngeal course of the proximal ICA. Left carotid system: Patent with moderate calcified plaque at the carotid bifurcation resulting in less than 50% proximal ICA stenosis. No evidence of dissection. Retropharyngeal course of the proximal ICA. Vertebral arteries: Patent and small bilaterally with the right being particularly hypoplastic. Small vessel size, suboptimal arterial opacification, image noise through the lower neck, and metallic dental streak artifact limits assessment for stenosis in the V1 and V2 segments, particularly on the right. Skeleton: Focally advanced disc degeneration at C5-6 and C6-7 with moderate neural foraminal stenosis due to uncovertebral spurring. Other neck: No evidence of cervical lymphadenopathy or mass. Upper chest: Clear lung apices. Review of the MIP images confirms the above findings CTA HEAD FINDINGS Anterior circulation: The internal carotid arteries are patent from skull base to carotid termini with mild calcified plaque bilaterally not resulting in significant stenosis. The A1 and M1 segments are widely patent. There is proximal occlusion of the right M2 inferior division with minimal intermittent distal reconstitution. No aneurysm is identified. Posterior circulation: The intracranial vertebral arteries are patent to the basilar and small,  right more so than left. Patent left PICA and right AICA origins are identified. SCA is are also grossly patent. 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 There are predominantly fetal type origins of both PCAs with bilateral PCA branch vessel irregularity and attenuation but no evidence of significant proximal stenosis. No aneurysm is identified. Venous sinuses: Patent. 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 Review of the MIP images confirms the above findings CT Brain Perfusion Findings: ASPECTS: 6 CBF (<30%) Volume: 85mL Perfusion (Tmax>6.0s) volume: 23mL Mismatch Volume: 32mL Infarction Location: Right MCA territory predominantly in the parietal lobe. IMPRESSION: 1. Proximal right M2 occlusion. 2. Associated acute right MCA infarct with penumbra on CTP as above. 3. Moderate cervical carotid artery atherosclerosis without significant stenosis. These results were communicated by telephone to Dr. Rory Percy on 11/29/2018 at 7:53 p.m. Electronically Signed   By: Logan Bores M.D.   On: 11/29/2018 20:37   Mr Brain Wo Contrast  Result Date: 11/30/2018 CLINICAL DATA:  Follow-up right MCA infarct. Right M2 occlusion status post endovascular revascularization. EXAM: MRI HEAD WITHOUT CONTRAST TECHNIQUE: Multiplanar, multiecho pulse sequences of the brain and surrounding structures were obtained without intravenous contrast. COMPARISON:  Head CT, CTA, and CT perfusion 11/29/2018 FINDINGS: The study is mildly motion degraded. Brain: There is a moderate-sized acute right MCA infarct involving the parietal lobe, posterior temporal lobe, lateral occipital lobe, and posterior insula with associated confluent petechial hemorrhage at the level of the insula and posterior operculum. There is associated cytotoxic edema without midline shift or other significant mass effect. Additional small acute infarcts are present in the anteroinferior right frontal lobe  and posterior right frontal lobe. There are also punctate acute infarcts in the high posterior left frontal lobe and possibly left parietal lobe. There is no extra-axial fluid collection. Scattered small foci of T2 hyperintensity in the cerebral white matter  bilaterally are nonspecific but compatible with mild chronic small vessel ischemic disease. Vascular: Major intracranial vascular flow voids are preserved. Skull and upper cervical spine: Unremarkable bone marrow signal. Sinuses/Orbits: Unremarkable orbits. Paranasal sinuses and mastoid air cells are clear. Other: None. IMPRESSION: 1. Moderate-sized acute right MCA infarct with confluent petechial hemorrhage. 2. One or two punctate acute left frontoparietal infarcts. 3. Mild chronic small vessel ischemic disease. Electronically Signed   By: Logan Bores M.D.   On: 11/30/2018 18:56   Ct Cerebral Perfusion W Contrast  Result Date: 11/29/2018 CLINICAL DATA:  Rightward gaze.  Left-sided numbness. EXAM: CT ANGIOGRAPHY HEAD AND NECK CT PERFUSION BRAIN TECHNIQUE: Multidetector CT imaging of the head and neck was performed using the standard protocol during bolus administration of intravenous contrast. Multiplanar CT image reconstructions and MIPs were obtained to evaluate the vascular anatomy. Carotid stenosis measurements (when applicable) are obtained utilizing NASCET criteria, using the distal internal carotid diameter as the denominator. Multiphase CT imaging of the brain was performed following IV bolus contrast injection. Subsequent parametric perfusion maps were calculated using RAPID software. CONTRAST:  168mL OMNIPAQUE IOHEXOL 350 MG/ML SOLN COMPARISON:  None. FINDINGS: CTA NECK FINDINGS Suboptimal arterial opacification. Aortic arch: Normal variant aortic arch branching pattern with common origin of the brachiocephalic and left common carotid arteries. Widely patent arch vessel origins. Right carotid system: Patent with moderate calcified plaque at the  carotid bifurcation. No evidence of significant stenosis or dissection. Retropharyngeal course of the proximal ICA. Left carotid system: Patent with moderate calcified plaque at the carotid bifurcation resulting in less than 50% proximal ICA stenosis. No evidence of dissection. Retropharyngeal course of the proximal ICA. Vertebral arteries: Patent and small bilaterally with the right being particularly hypoplastic. Small vessel size, suboptimal arterial opacification, image noise through the lower neck, and metallic dental streak artifact limits assessment for stenosis in the V1 and V2 segments, particularly on the right. Skeleton: Focally advanced disc degeneration at C5-6 and C6-7 with moderate neural foraminal stenosis due to uncovertebral spurring. Other neck: No evidence of cervical lymphadenopathy or mass. Upper chest: Clear lung apices. Review of the MIP images confirms the above findings CTA HEAD FINDINGS Anterior circulation: The internal carotid arteries are patent from skull base to carotid termini with mild calcified plaque bilaterally not resulting in significant stenosis. The A1 and M1 segments are widely patent. There is proximal occlusion of the right M2 inferior division with minimal intermittent distal reconstitution. No aneurysm is identified. Posterior circulation: The intracranial vertebral arteries are patent to the basilar and small, right more so than left. Patent left PICA and right AICA origins are identified. SCA is are also grossly patent. 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 There are predominantly fetal type origins of both PCAs with bilateral PCA branch vessel irregularity and attenuation but no evidence of significant proximal stenosis. No aneurysm is identified. Venous sinuses: Patent. 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 Review of the MIP images confirms the above findings CT Brain Perfusion Findings:  ASPECTS: 6 CBF (<30%) Volume: 19mL Perfusion (Tmax>6.0s) volume: 29mL Mismatch Volume: 104mL Infarction Location: Right MCA territory predominantly in the parietal lobe. IMPRESSION: 1. Proximal right M2 occlusion. 2. Associated acute right MCA infarct with penumbra on CTP as above. 3. Moderate cervical carotid artery atherosclerosis without significant stenosis. These results were communicated by telephone to Dr. Rory Percy on 11/29/2018 at 7:53 p.m. Electronically Signed   By: Logan Bores M.D.   On: 11/29/2018 20:37  Dg Chest Port 1 View  Result Date: 11/30/2018 CLINICAL DATA:  Elevated temperature. EXAM: PORTABLE CHEST 1 VIEW COMPARISON:  Radiographs of May 02, 2017. FINDINGS: Stable cardiomediastinal silhouette. No pneumothorax or pleural effusion is noted. Minimal bibasilar subsegmental atelectasis is noted. The visualized skeletal structures are unremarkable. IMPRESSION: Minimal bibasilar subsegmental atelectasis. Electronically Signed   By: Marijo Conception M.D.   On: 11/30/2018 11:34   Ct Head Code Stroke Wo Contrast  Result Date: 11/29/2018 CLINICAL DATA:  Code stroke. Rightward gaze. Left-sided tingling. Confusion EXAM: CT HEAD WITHOUT CONTRAST TECHNIQUE: Contiguous axial images were obtained from the base of the skull through the vertex without intravenous contrast. COMPARISON:  None. FINDINGS: Brain: There is loss of gray-white differentiation consistent with acute infarction in the posterior right MCA territory involving the posterior temporal lobe, parietal lobe, and posterior insula. No acute intracranial hemorrhage, mass, midline shift, or extra-axial fluid collection is identified. The ventricles and sulci are within normal limits for age. Vascular: Calcified atherosclerosis at the skull base. Hyperdense right MCA branch vessels in the insula. Skull: No fracture or focal osseous lesion. Sinuses/Orbits: Paranasal sinuses and mastoid air cells are clear. Unremarkable orbits. Other: None.  ASPECTS ([place] Stroke Program Early CT Score) - Ganglionic level infarction (caudate, lentiform nuclei, internal capsule, insula, M1-M3 cortex): 4 (insula, M2, and M3 involvement) - Supraganglionic infarction (M4-M6 cortex): 2 (M6 involvement) Total score (0-10 with 10 being normal): 6 IMPRESSION: 1. Acute posterior right MCA infarct with hyperdense right M2 branches. No hemorrhage. 2. ASPECTS is 6. These results were communicated to Dr. Rory Percy at 7:11 pm on 11/29/2018 by text page via the [facility] messaging system. Electronically Signed   By: Logan Bores M.D.   On: 11/29/2018 19:15    Labs:  CBC: Recent Labs    11/29/18 1902 11/29/18 1955 11/30/18 0736  WBC  --  10.0 10.6*  HGB 15.6* 14.7 14.0  HCT 46.0 43.4 40.7  PLT  --  261 286    COAGS: Recent Labs    11/30/18 0736  INR 1.1  APTT 26    BMP: Recent Labs    11/29/18 1902 11/29/18 1955 11/30/18 0736  NA 136 138 137  K 4.2 4.1 4.3  CL 102 101 103  CO2  --  26 23  GLUCOSE 130* 114* 180*  BUN 20 16 12   CALCIUM  --  9.2 9.0  CREATININE 0.90 0.94 0.90  GFRNONAA  --  >60 >60  GFRAA  --  >60 >60    LIVER FUNCTION TESTS: Recent Labs    11/29/18 1955  BILITOT 0.3  AST 21  ALT 19  ALKPHOS 82  PROT 6.1*  ALBUMIN 3.5    Assessment and Plan:  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 Estanislado Pandy. Patient's condition stable- can spontaneously move all extremities, still with right gaze preference and left facial droop. Right groin stable. RN reports worsening of symptoms (headache, more lethargic, worsening dysarthria/facial droop)- for STAT CT head today per neurology, results demonstrate hemorrhagic conversion. Further plans per neurology- appreciate and agree with management. Please call NIR with questions/concerns.   Electronically Signed: Earley Abide, PA-C 12/01/2018, 11:47 AM   I spent a total of 25 Minutes at the the  patient's bedside AND on the patient's hospital floor or unit, greater than 50% of which was counseling/coordinating care for right MCA occlusion s/p revascularization.

## 2018-12-01 NOTE — Progress Notes (Signed)
    [facility] has been requested to perform a transesophageal echocardiogram on Claiborne Rigg for stroke.  After careful review of history and examination, the risks and benefits of transesophageal echocardiogram have been explained including risks of esophageal damage, perforation (1:10,000 risk), bleeding, pharyngeal hematoma as well as other potential complications associated with conscious sedation including aspiration, arrhythmia, respiratory failure and death. Alternatives to treatment were discussed, questions were answered. Patient wants to discuss with her daughter Jeannetta Nap a Nurse so she can discuss with pt's husband.     Orders placed but not sure if pt will sign permit.     Cecilie Kicks, NP  12/01/2018 2:43 PM

## 2018-12-02 ENCOUNTER — Inpatient Hospital Stay (HOSPITAL_COMMUNITY)
Admission: RE | Admit: 2018-12-02 | Discharge: 2018-12-26 | DRG: 056 | Disposition: A | Discharge status: Home Health | Payer: Medicare Other | Source: Intra-hospital | Attending: Physical Medicine & Rehabilitation | Admitting: Physical Medicine & Rehabilitation

## 2018-12-02 ENCOUNTER — Encounter (HOSPITAL_COMMUNITY)
Admission: EM | Disposition: A | Discharge status: Rehabilitation Facility | Payer: Self-pay | Source: Home / Self Care | Attending: Neurology

## 2018-12-02 ENCOUNTER — Encounter (HOSPITAL_COMMUNITY): Payer: Self-pay | Admitting: Interventional Radiology

## 2018-12-02 ENCOUNTER — Inpatient Hospital Stay (HOSPITAL_COMMUNITY): Payer: Medicare Other

## 2018-12-02 DIAGNOSIS — R221 Localized swelling, mass and lump, neck: Secondary | ICD-10-CM | POA: Diagnosis not present

## 2018-12-02 DIAGNOSIS — Z973 Presence of spectacles and contact lenses: Secondary | ICD-10-CM

## 2018-12-02 DIAGNOSIS — R414 Neurologic neglect syndrome: Secondary | ICD-10-CM | POA: Diagnosis present

## 2018-12-02 DIAGNOSIS — I10 Essential (primary) hypertension: Secondary | ICD-10-CM | POA: Diagnosis not present

## 2018-12-02 DIAGNOSIS — I639 Cerebral infarction, unspecified: Secondary | ICD-10-CM | POA: Diagnosis not present

## 2018-12-02 DIAGNOSIS — K5901 Slow transit constipation: Secondary | ICD-10-CM | POA: Diagnosis not present

## 2018-12-02 DIAGNOSIS — I82433 Acute embolism and thrombosis of popliteal vein, bilateral: Secondary | ICD-10-CM

## 2018-12-02 DIAGNOSIS — N390 Urinary tract infection, site not specified: Secondary | ICD-10-CM | POA: Diagnosis not present

## 2018-12-02 DIAGNOSIS — I700 Atherosclerosis of aorta: Secondary | ICD-10-CM | POA: Diagnosis present

## 2018-12-02 DIAGNOSIS — M170 Bilateral primary osteoarthritis of knee: Secondary | ICD-10-CM | POA: Diagnosis present

## 2018-12-02 DIAGNOSIS — R0989 Other specified symptoms and signs involving the circulatory and respiratory systems: Secondary | ICD-10-CM

## 2018-12-02 DIAGNOSIS — I63511 Cerebral infarction due to unspecified occlusion or stenosis of right middle cerebral artery: Secondary | ICD-10-CM | POA: Diagnosis not present

## 2018-12-02 DIAGNOSIS — I69354 Hemiplegia and hemiparesis following cerebral infarction affecting left non-dominant side: Secondary | ICD-10-CM | POA: Diagnosis not present

## 2018-12-02 DIAGNOSIS — I69391 Dysphagia following cerebral infarction: Secondary | ICD-10-CM | POA: Diagnosis not present

## 2018-12-02 DIAGNOSIS — N319 Neuromuscular dysfunction of bladder, unspecified: Secondary | ICD-10-CM | POA: Diagnosis not present

## 2018-12-02 DIAGNOSIS — I82442 Acute embolism and thrombosis of left tibial vein: Secondary | ICD-10-CM | POA: Diagnosis not present

## 2018-12-02 DIAGNOSIS — B964 Proteus (mirabilis) (morganii) as the cause of diseases classified elsewhere: Secondary | ICD-10-CM | POA: Diagnosis not present

## 2018-12-02 DIAGNOSIS — F419 Anxiety disorder, unspecified: Secondary | ICD-10-CM | POA: Diagnosis present

## 2018-12-02 DIAGNOSIS — R1310 Dysphagia, unspecified: Secondary | ICD-10-CM | POA: Diagnosis present

## 2018-12-02 DIAGNOSIS — R7303 Prediabetes: Secondary | ICD-10-CM | POA: Diagnosis present

## 2018-12-02 DIAGNOSIS — J45909 Unspecified asthma, uncomplicated: Secondary | ICD-10-CM | POA: Diagnosis present

## 2018-12-02 DIAGNOSIS — R339 Retention of urine, unspecified: Secondary | ICD-10-CM | POA: Diagnosis not present

## 2018-12-02 DIAGNOSIS — R350 Frequency of micturition: Secondary | ICD-10-CM | POA: Diagnosis not present

## 2018-12-02 DIAGNOSIS — K219 Gastro-esophageal reflux disease without esophagitis: Secondary | ICD-10-CM | POA: Diagnosis present

## 2018-12-02 DIAGNOSIS — E785 Hyperlipidemia, unspecified: Secondary | ICD-10-CM | POA: Diagnosis present

## 2018-12-02 DIAGNOSIS — G8194 Hemiplegia, unspecified affecting left nondominant side: Secondary | ICD-10-CM | POA: Diagnosis present

## 2018-12-02 DIAGNOSIS — I69154 Hemiplegia and hemiparesis following nontraumatic intracerebral hemorrhage affecting left non-dominant side: Secondary | ICD-10-CM

## 2018-12-02 DIAGNOSIS — Z79899 Other long term (current) drug therapy: Secondary | ICD-10-CM

## 2018-12-02 DIAGNOSIS — I69398 Other sequelae of cerebral infarction: Secondary | ICD-10-CM

## 2018-12-02 DIAGNOSIS — F329 Major depressive disorder, single episode, unspecified: Secondary | ICD-10-CM | POA: Diagnosis present

## 2018-12-02 DIAGNOSIS — G8929 Other chronic pain: Secondary | ICD-10-CM | POA: Diagnosis present

## 2018-12-02 DIAGNOSIS — Z6841 Body Mass Index (BMI) 40.0 and over, adult: Secondary | ICD-10-CM

## 2018-12-02 DIAGNOSIS — R739 Hyperglycemia, unspecified: Secondary | ICD-10-CM | POA: Diagnosis not present

## 2018-12-02 DIAGNOSIS — I82432 Acute embolism and thrombosis of left popliteal vein: Secondary | ICD-10-CM | POA: Diagnosis present

## 2018-12-02 DIAGNOSIS — G936 Cerebral edema: Secondary | ICD-10-CM | POA: Diagnosis present

## 2018-12-02 DIAGNOSIS — Z823 Family history of stroke: Secondary | ICD-10-CM

## 2018-12-02 DIAGNOSIS — R609 Edema, unspecified: Secondary | ICD-10-CM

## 2018-12-02 DIAGNOSIS — R7309 Other abnormal glucose: Secondary | ICD-10-CM | POA: Diagnosis not present

## 2018-12-02 DIAGNOSIS — I6389 Other cerebral infarction: Secondary | ICD-10-CM

## 2018-12-02 DIAGNOSIS — M50323 Other cervical disc degeneration at C6-C7 level: Secondary | ICD-10-CM | POA: Diagnosis present

## 2018-12-02 DIAGNOSIS — Z791 Long term (current) use of non-steroidal anti-inflammatories (NSAID): Secondary | ICD-10-CM

## 2018-12-02 DIAGNOSIS — Z9049 Acquired absence of other specified parts of digestive tract: Secondary | ICD-10-CM

## 2018-12-02 DIAGNOSIS — I82402 Acute embolism and thrombosis of unspecified deep veins of left lower extremity: Secondary | ICD-10-CM | POA: Diagnosis not present

## 2018-12-02 HISTORY — PX: BUBBLE STUDY: SHX6837

## 2018-12-02 HISTORY — PX: LOOP RECORDER INSERTION: EP1214

## 2018-12-02 HISTORY — PX: TEE WITHOUT CARDIOVERSION: SHX5443

## 2018-12-02 LAB — CBC
HCT: 39.6 % (ref 36.0–46.0)
Hemoglobin: 13.4 g/dL (ref 12.0–15.0)
MCH: 33.8 pg (ref 26.0–34.0)
MCHC: 33.8 g/dL (ref 30.0–36.0)
MCV: 100 fL (ref 80.0–100.0)
Platelets: 272 10*3/uL (ref 150–400)
RBC: 3.96 MIL/uL (ref 3.87–5.11)
RDW: 13.3 % (ref 11.5–15.5)
WBC: 9.8 10*3/uL (ref 4.0–10.5)
nRBC: 0 % (ref 0.0–0.2)

## 2018-12-02 LAB — PROTIME-INR
INR: 1.1 (ref 0.8–1.2)
Prothrombin Time: 13.9 seconds (ref 11.4–15.2)

## 2018-12-02 LAB — GLUCOSE, CAPILLARY
Glucose-Capillary: 127 mg/dL — ABNORMAL HIGH (ref 70–99)
Glucose-Capillary: 120 mg/dL — ABNORMAL HIGH (ref 70–99)

## 2018-12-02 LAB — BASIC METABOLIC PANEL
Anion gap: 8 (ref 5–15)
BUN: 9 mg/dL (ref 8–23)
CO2: 24 mmol/L (ref 22–32)
Calcium: 8.9 mg/dL (ref 8.9–10.3)
Chloride: 105 mmol/L (ref 98–111)
Creatinine, Ser: 0.84 mg/dL (ref 0.44–1.00)
GFR calc Af Amer: >60 mL/min (ref >60)
GFR calc non Af Amer: >60 mL/min (ref >60)
Glucose, Bld: 118 mg/dL — ABNORMAL HIGH (ref 70–99)
Potassium: 3.7 mmol/L (ref 3.5–5.1)
Sodium: 137 mmol/L (ref 135–145)

## 2018-12-02 SURGERY — LOOP RECORDER INSERTION

## 2018-12-02 SURGERY — ECHOCARDIOGRAM, TRANSESOPHAGEAL
Anesthesia: Moderate Sedation

## 2018-12-02 MED ORDER — ASPIRIN 81 MG PO CHEW: 81.0000 mg | CHEWABLE_TABLET | Freq: Every day | ORAL | Status: DC

## 2018-12-02 MED ORDER — MIDAZOLAM HCL (PF) 10 MG/2ML IJ SOLN
INTRAMUSCULAR | Status: DC | PRN
  Administered 2018-12-02 (×2): 2 mg via INTRAVENOUS
  Administered 2018-12-02: 1 mg via INTRAVENOUS

## 2018-12-02 MED ORDER — BETHANECHOL CHLORIDE 5 MG PO TABS
5.0000 mg | ORAL_TABLET | Freq: Three times a day (TID) | ORAL | Status: DC
  Administered 2018-12-02 – 2018-12-04 (×5): 5 mg via ORAL
  Filled 2018-12-02 (×6): qty 1

## 2018-12-02 MED ORDER — PROCHLORPERAZINE 25 MG RE SUPP
12.5000 mg | Freq: Four times a day (QID) | RECTAL | Status: DC | PRN
  Filled 2018-12-02: qty 1

## 2018-12-02 MED ORDER — PANTOPRAZOLE SODIUM 40 MG PO TBEC
40.0000 mg | DELAYED_RELEASE_TABLET | Freq: Every day | ORAL | Status: DC
  Administered 2018-12-02 – 2018-12-25 (×24): 40 mg via ORAL
  Filled 2018-12-02 (×24): qty 1

## 2018-12-02 MED ORDER — FENTANYL CITRATE (PF) 100 MCG/2ML IJ SOLN
INTRAMUSCULAR | Status: DC | PRN
  Administered 2018-12-02 (×2): 25 ug via INTRAVENOUS

## 2018-12-02 MED ORDER — LISINOPRIL 2.5 MG PO TABS: 2.5000 mg | ORAL_TABLET | Freq: Every day | ORAL | Status: DC

## 2018-12-02 MED ORDER — ALUM & MAG HYDROXIDE-SIMETH 200-200-20 MG/5ML PO SUSP: 30.0000 mL | ORAL | Status: DC | PRN

## 2018-12-02 MED ORDER — LIDOCAINE-EPINEPHRINE 1 %-1:100000 IJ SOLN
INTRAMUSCULAR | Status: DC | PRN
  Administered 2018-12-02: 10 mL

## 2018-12-02 MED ORDER — LIDOCAINE HCL URETHRAL/MUCOSAL 2 % EX GEL: 1.0000 "application " | CUTANEOUS | Status: DC | PRN

## 2018-12-02 MED ORDER — LIDOCAINE-EPINEPHRINE 1 %-1:100000 IJ SOLN
INTRAMUSCULAR | Status: AC
  Filled 2018-12-02: qty 1

## 2018-12-02 MED ORDER — FENTANYL CITRATE (PF) 100 MCG/2ML IJ SOLN
INTRAMUSCULAR | Status: AC
  Filled 2018-12-02: qty 2

## 2018-12-02 MED ORDER — TRAZODONE HCL 50 MG PO TABS
25.0000 mg | ORAL_TABLET | Freq: Every evening | ORAL | Status: DC | PRN
  Administered 2018-12-02 – 2018-12-03 (×2): 50 mg via ORAL
  Filled 2018-12-02 (×2): qty 1

## 2018-12-02 MED ORDER — BUTAMBEN-TETRACAINE-BENZOCAINE 2-2-14 % EX AERO
INHALATION_SPRAY | CUTANEOUS | Status: DC | PRN
  Administered 2018-12-02: 2 via TOPICAL

## 2018-12-02 MED ORDER — ACETAMINOPHEN 325 MG PO TABS
325.0000 mg | ORAL_TABLET | ORAL | Status: DC | PRN
  Administered 2018-12-03 – 2018-12-26 (×15): 650 mg via ORAL
  Filled 2018-12-02 (×16): qty 2

## 2018-12-02 MED ORDER — POLYETHYLENE GLYCOL 3350 17 G PO PACK
17.0000 g | PACK | Freq: Every day | ORAL | Status: DC | PRN
  Administered 2018-12-07 – 2018-12-17 (×7): 17 g via ORAL
  Filled 2018-12-02 (×6): qty 1

## 2018-12-02 MED ORDER — MIDAZOLAM HCL (PF) 5 MG/ML IJ SOLN
INTRAMUSCULAR | Status: AC
  Filled 2018-12-02: qty 2

## 2018-12-02 MED ORDER — PROCHLORPERAZINE EDISYLATE 10 MG/2ML IJ SOLN: 5.0000 mg | Freq: Four times a day (QID) | INTRAMUSCULAR | Status: DC | PRN

## 2018-12-02 MED ORDER — FLEET ENEMA 7-19 GM/118ML RE ENEM
1.0000 | ENEMA | Freq: Once | RECTAL | Status: AC | PRN
  Administered 2018-12-11: 1 via RECTAL
  Filled 2018-12-02: qty 1

## 2018-12-02 MED ORDER — ATORVASTATIN CALCIUM 40 MG PO TABS
40.0000 mg | ORAL_TABLET | Freq: Every day | ORAL | Status: DC
  Administered 2018-12-02 – 2018-12-25 (×24): 40 mg via ORAL
  Filled 2018-12-02 (×23): qty 1

## 2018-12-02 MED ORDER — BETHANECHOL CHLORIDE 5 MG PO TABS: 5.0000 mg | ORAL_TABLET | Freq: Three times a day (TID) | ORAL | Status: DC

## 2018-12-02 MED ORDER — ASPIRIN 81 MG PO CHEW
81.0000 mg | CHEWABLE_TABLET | Freq: Every day | ORAL | Status: DC
  Administered 2018-12-03 – 2018-12-26 (×24): 81 mg via ORAL
  Filled 2018-12-02 (×24): qty 1

## 2018-12-02 MED ORDER — ATORVASTATIN CALCIUM 40 MG PO TABS: 40.0000 mg | ORAL_TABLET | Freq: Every day | ORAL | Status: DC

## 2018-12-02 MED ORDER — PROCHLORPERAZINE MALEATE 5 MG PO TABS
5.0000 mg | ORAL_TABLET | Freq: Four times a day (QID) | ORAL | Status: DC | PRN
  Administered 2018-12-11: 5 mg via ORAL
  Filled 2018-12-02: qty 1

## 2018-12-02 MED ORDER — SODIUM CHLORIDE BACTERIOSTATIC 0.9 % IJ SOLN
INTRAMUSCULAR | Status: DC | PRN
  Administered 2018-12-02: 18 mL via INTRAVENOUS

## 2018-12-02 MED ORDER — SENNOSIDES-DOCUSATE SODIUM 8.6-50 MG PO TABS: 1.0000 | ORAL_TABLET | Freq: Every evening | ORAL | Status: DC | PRN

## 2018-12-02 MED ORDER — CLONIDINE HCL 0.1 MG PO TABS: 0.1000 mg | ORAL_TABLET | Freq: Four times a day (QID) | ORAL | Status: DC | PRN

## 2018-12-02 MED ORDER — SODIUM CHLORIDE 0.9 % IV SOLN: 75.0000 mL | INTRAVENOUS | 0 refills | Status: DC

## 2018-12-02 MED ORDER — BISACODYL 10 MG RE SUPP
10.0000 mg | Freq: Every day | RECTAL | Status: DC | PRN
  Administered 2018-12-03 – 2018-12-18 (×4): 10 mg via RECTAL
  Filled 2018-12-02 (×4): qty 1

## 2018-12-02 MED ORDER — DIPHENHYDRAMINE HCL 12.5 MG/5ML PO ELIX: 12.5000 mg | ORAL_SOLUTION | Freq: Four times a day (QID) | ORAL | Status: DC | PRN

## 2018-12-02 MED ORDER — GUAIFENESIN-DM 100-10 MG/5ML PO SYRP: 5.0000 mL | ORAL_SOLUTION | Freq: Four times a day (QID) | ORAL | Status: DC | PRN

## 2018-12-02 SURGICAL SUPPLY — 2 items
LOOP REVEAL LINQSYS (Prosthesis & Implant Heart) ×1 IMPLANT
PACK LOOP INSERTION (CUSTOM PROCEDURE TRAY) ×1 IMPLANT

## 2018-12-02 NOTE — Progress Notes (Signed)
SLP Cancellation Note  Patient Details Name: Deanna [name] MRN: [id number] DOB: 10-05-1950   Cancelled treatment:       Reason Eval/Treat Not Completed: Patient at procedure or test/unavailable. Patient at TEE loop procedure and plans are for discharge to CIR today. Will follow up next date if patient still on acute.   Nadara Mode Tarrell 12/02/2018, 1:20 PM   Sonia Baller, MA, CCC-SLP Speech Therapy [facility] Acute Rehab Pager: [phone]

## 2018-12-02 NOTE — PMR Pre-admission (Signed)
PMR Admission Coordinator Pre-Admission Assessment  Patient: Deanna [name] is an 68 y.o., female MRN: [id number] DOB: 31-May-1950 Height: '5\' 1"'$  (154.9 cm) Weight: 111.4 kg  Insurance Information HMO:     PPO:      PCP:      IPA:      80/20:      OTHER: no HMO PRIMARY: Medicare a and b      Policy#: [id number]      Subscriber: pt Benefits:  Phone #: passport one online     Name: 7/21 Eff. Date: 05/14/2014     Deduct: $1408      Out of Pocket Max: none      Life Max: none CIR: 100%      SNF: 20 full days Outpatient: 80%     Co-Pay: 20% Home Health: 100%      Co-Pay: none DME: 80%     Co-Pay: 20% Providers: pt choice  SECONDARY: [employer]      Policy#: [id number]      Subscriber: pt  Medicaid Application Date:       Case Manager:  Disability Application Date:       Case Worker:   The "Data Collection Information Summary" for patients in Inpatient Rehabilitation Facilities with attached "Privacy Act [place] Records" was provided and verbally reviewed with: Patient and Family  Emergency Contact Information Contact Information    Name Relation Home Work Mobile   Deanna [name], [name] [phone]  [phone]      Current Medical History  Patient Admitting Diagnosis: Right MCA infarct  History of Present Illness: 68 year old female with history of OA, asthma, prediabetes who presented on 11/29/2018 with left sided tingling with LLE weakness and right gaze prefernce.   CT showed acute right MCA infarct with hyperdense sign and was treated with TPA. CTA head/neck with perfusion showed proximal right M2 occlusion with assoicated acute right MCA infarct with penumbra and moderate cervical carotid artery atheroscleosis. Underwent cerebral angio with endovascular revascularization of R MCA by Dr. Estanislado Pandy. Leonie Man felt stroke was embolic due to unknown source and recommended TEE for workup 7/21  With no PFO, thrombus or valvular lesions. Aortic atherosclerosis present in aortic arch.  LOOP to be implanted 7/21 prior to d/c to CIR.  Patient did complain of some nausea and having trouble passing urine requiring in and Out caths several times. Placed on urecholine.  MRI showed large posterior division right MCA infarct with slight hemorrhagic transformation. On 7/20 had worsening Left HP , headache and nausea. Bolused with IVF and Zofran prn. Patient with complaints of back pain, prn pain control for has history of chronic back pain.   Complete NIHSS TOTAL: 8  Patient's medical record from [facility]  has been reviewed by the rehabilitation admission coordinator and physician.  Past Medical History  Past Medical History:  Diagnosis Date  . Arthritis    osteoarthritis in  both knees  . Asthma   . Difficult intravenous access   . GERD (gastroesophageal reflux disease)   . Heart murmur    born with years ago  . Pre-diabetes    last A1C=6.0 per pt  . Torn meniscus    left knee w fracture x2 to left knee  . Wears glasses     Family History   family history is not on file.  Prior Rehab/Hospitalizations Has the patient had prior rehab or hospitalizations prior to admission? Yes  Has the patient had major surgery during 100 days prior  to admission? Yes   Current Medications  Current Facility-Administered Medications:  .  [MAR Hold]  stroke: mapping our early stages of recovery book, , Does not apply, Once, Amie Portland, MD .  0.9 %  sodium chloride infusion, , Intravenous, Continuous, Deveshwar, Sanjeev, MD, Last Rate: 75 mL/hr at 11/30/18 1931 .  0.9 %  sodium chloride infusion, , Intravenous, Continuous, Isaiah Serge, NP .  Doug Sou Hold] acetaminophen (TYLENOL) tablet 650 mg, 650 mg, Oral, Q4H PRN, 650 mg at 12/01/18 2107 **OR** [MAR Hold] acetaminophen (TYLENOL) solution 650 mg, 650 mg, Per Tube, Q4H PRN **OR** [MAR Hold] acetaminophen (TYLENOL) suppository 650 mg, 650 mg, Rectal, Q4H PRN, Deveshwar, Sanjeev, MD .  Doug Sou Hold] aspirin chewable tablet 81  mg, 81 mg, Oral, Daily, Amie Portland, MD, 81 mg at 12/02/18 1055 .  [MAR Hold] atorvastatin (LIPITOR) tablet 40 mg, 40 mg, Oral, q1800, Biby, Sharon L, NP, 40 mg at 12/01/18 1847 .  [MAR Hold] bethanechol (URECHOLINE) tablet 5 mg, 5 mg, Oral, TID, Garvin Fila, MD, 5 mg at 12/02/18 1055 .  [MAR Hold] Chlorhexidine Gluconate Cloth 2 % PADS 6 each, 6 each, Topical, Q0600, Garvin Fila, MD, 6 each at 12/01/18 1743 .  [MAR Hold] hydrALAZINE (APRESOLINE) injection 20 mg, 20 mg, Intravenous, Q8H PRN, Garvin Fila, MD, 20 mg at 12/01/18 0086 .  [MAR Hold] ondansetron (ZOFRAN) injection 4 mg, 4 mg, Intravenous, Q8H PRN, Garvin Fila, MD, 4 mg at 12/01/18 0947 .  [MAR Hold] pantoprazole (PROTONIX) EC tablet 40 mg, 40 mg, Oral, QHS, Garvin Fila, MD, 40 mg at 12/01/18 2107 .  [MAR Hold] senna-docusate (Senokot-S) tablet 1 tablet, 1 tablet, Oral, QHS PRN, Amie Portland, MD  Patients Current Diet:  Diet Order            Diet NPO time specified  Diet effective midnight              Precautions / Restrictions Precautions Precautions: Fall Precaution Comments: left neglect Restrictions Weight Bearing Restrictions: No   Has the patient had 2 or more falls or a fall with injury in the past year? No  Prior Activity Level Community (5-7x/wk): independent, retired, driving, active  Prior Functional Level Self Care: Did the patient need help bathing, dressing, using the toilet or eating? Independent  Indoor Mobility: Did the patient need assistance with walking from room to room (with or without device)? Independent  Stairs: Did the patient need assistance with internal or external stairs (with or without device)? Independent  Functional Cognition: Did the patient need help planning regular tasks such as shopping or remembering to take medications? Independent  Home Assistive Devices / Equipment    Prior Device Use: Indicate devices/aids used by the patient prior to current  illness, exacerbation or injury? None of the above  Current Functional Level Cognition  Overall Cognitive Status: Impaired/Different from baseline Orientation Level: Oriented X4 Safety/Judgement: Decreased awareness of deficits, Decreased awareness of safety General Comments: lack of awareness to deficits. Pt report could do it if back didnt hurt. Right gaze preference. Able to get to midline and gaze left on a few occasions with max cues.    Extremity Assessment (includes Sensation/Coordination)  Upper Extremity Assessment: Defer to OT evaluation LUE Deficits / Details: decrease sensation, able to move FULL ROM LUE Coordination: decreased fine motor, decreased gross motor  Lower Extremity Assessment: LLE deficits/detail LLE Deficits / Details: Grossly ~3+ - 4/5 throughout. LLE Sensation: decreased light touch, decreased proprioception  ADLs  Overall ADL's : Needs assistance/impaired Eating/Feeding: Minimal assistance, Sitting Eating/Feeding Details (indicate cue type and reason): needs mod cues to scan the entire tray and locate all the times. pt able to recall a tray is square and locate all 4 corners Grooming: Wash/dry hands, Set up Grooming Details (indicate cue type and reason): provided hand sanitizer Toilet Transfer: +2 for physical assistance, +2 for safety/equipment, Maximal assistance General ADL Comments: pt on BSC on arrival and transfered to chair for eating breakfast. pt reports sitting int he chair will help her back    Mobility  Overal bed mobility: Needs Assistance Bed Mobility: Rolling, Sidelying to Sit Rolling: Mod assist Sidelying to sit: Mod assist, HOB elevated General bed mobility comments: Able to bring LLE to EOB only needing assist for last little bit; assist with scooting bottom and elevating trunk. Heavy use of rail. Cues to push up through RUE.    Transfers  Overall transfer level: Needs assistance Equipment used: 2 person hand held  assist Transfers: Sit to/from Stand, Stand Pivot Transfers Sit to Stand: Max assist, +2 physical assistance Stand pivot transfers: Mod assist, +2 physical assistance, +2 safety/equipment General transfer comment: Assist of 2 to power to standing- difficulty with initiation. Stood from [employer], from [facility] x[facility]. SPT bed to [facility] with assist for weightshifting and momentum.    Ambulation / Gait / Stairs / Wheelchair Mobility  Ambulation/Gait Ambulation/Gait assistance: Mod assist, +2 physical assistance, +2 safety/equipment Gait Distance (Feet): 3 Feet Assistive device: 2 person hand held assist Gait Pattern/deviations: Trunk flexed, Step-to pattern General Gait Details: Able to take a few steps to get to chair with assist for weight shifting and momentum to move towards right. Poor sequencing. Poor awareness of left side of body; flexed posture. Gait velocity: decreased    Posture / Balance Dynamic Sitting Balance Sitting balance - Comments: Able to sit with 1 UE support and close min guard for safety. Reports back pain. Balance Overall balance assessment: Needs assistance Sitting-balance support: Feet supported, Single extremity supported Sitting balance-Leahy Scale: Fair Sitting balance - Comments: Able to sit with 1 UE support and close min guard for safety. Reports back pain. Standing balance support: Bilateral upper extremity supported, During functional activity Standing balance-Leahy Scale: Poor Standing balance comment: External support for standing balance; no knee buckling but poor awareness of left side.    Special needs/care consideration BiPAP/CPAP  N/a CPM  N/a Continuous Drip IV  N/a Dialysis n/a Life Vest  N/a Oxygen  N/a Special Bed  N/a Trach Size  N/a Wound Vac n/a Skin blister Left upper arm; ecchymosis to abdomen and right arm Bowel mgmt:  No LMB documented Bladder mgmt 14 Fr. Placed 7/20 after I an do caths needed Diabetic mgmt: Hgb A1c 5.9 Behavioral consideration   N/a Chemo/radiation  N/a LOOP recorder placed 7/21    Previous Home Environment  Living Arrangements: Spouse/significant other  Lives With: Spouse Available Help at Discharge: (spouse part time pastor, he can work from home; 3 daughters,) Type of Home: House Home Layout: One level Home Access: Stairs to enter Entrance Stairs-Rails: Right Entrance Stairs-Number of Steps: 2 Bathroom Shower/Tub: Multimedia programmer: Handicapped height Bathroom Accessibility: Yes How Accessible: Accessible via [place]: No Additional Comments: spouse and 3 daughters, one lives down the street is RN works from home  Discharge [place] for Discharge Living Setting: Patient's home, Lives with (comment)(spouse) Type of Home at Discharge: [place]: One level Discharge Home  Access: Stairs to enter Entrance Stairs-Rails: Right Entrance Stairs-Number of Steps: 2 Discharge Bathroom Shower/Tub: Walk-in shower Discharge Bathroom Toilet: Handicapped height Discharge Bathroom Accessibility: Yes How Accessible: Accessible via walker Does the patient have any problems obtaining your medications?: No  Social/Family/Support Systems Patient Roles: Spouse, Parent, Caregiver(helps with grandchildren) Contact Information: spouse, Phil Anticipated Caregiver: spouse and 3 daughters Anticipated Ambulance person Information: see above Ability/Limitations of Caregiver: spouse works part time but can work from home; youngest daughter lives down street is Therapist, sports and works from home Caregiver Availability: 24/7 Discharge Plan Discussed with Primary Caregiver: Yes Is Caregiver In Agreement with Plan?: Yes Does Caregiver/Family have Issues with Lodging/Transportation while Pt is in Rehab?: No  Goals/Additional Needs Patient/Family Goal for Rehab: supervision to min PT and OT, superivsion SLP Expected length of stay: ELOS 10 to 12 days Pt/Family Agrees to  Admission and willing to participate: Yes Program Orientation Provided & Reviewed with Pt/Caregiver Including Roles  & Responsibilities: Yes  Decrease burden of Care through IP rehab admission:n/a  Possible need for SNF placement upon discharge:  Not anticipated  Patient Condition: I have reviewed medical records from [facility] , spoken with  patient and spouse. I met with patient at the bedside for inpatient rehabilitation assessment.  Patient will benefit from ongoing PT, OT and SLP, can actively participate in 3 hours of therapy a day 5 days of the week, and can make measurable gains during the admission.  Patient will also benefit from the coordinated team approach during an Inpatient Acute Rehabilitation admission.  The patient will receive intensive therapy as well as Rehabilitation physician, nursing, social worker, and care management interventions.  Due to bladder management, bowel management, safety, skin/wound care, disease management, medication administration, pain management and patient education the patient requires 24 hour a day rehabilitation nursing.  The patient is currently mod assist with mobility and basic ADLs.  Discharge setting and therapy post discharge at home with home health is anticipated.  Patient has agreed to participate in the Acute Inpatient Rehabilitation Program and will admit today.  Preadmission Screen Completed By:  Annamary Rummage MSN 12/02/2018 1:34 PM ______________________________________________________________________   Discussed status with Dr. Naaman Plummer on  12/02/2018 at 1334 and received approval for admission today.  Admission Coordinator:  Cleatrice Burke, RN, time 1275 date 12/02/2018   Assessment/Plan: Diagnosis: embolic right MCA infarct 1. Does the need for close, 24 hr/day Medical supervision in concert with the patient's rehab needs make it unreasonable for this patient to be served in a less intensive setting?  Yes 2. Co-Morbidities requiring supervision/potential complications: post-stroke sequelae 3. Due to bladder management, bowel management, safety, skin/wound care, disease management, medication administration, pain management and patient education, does the patient require 24 hr/day rehab nursing? Yes 4. Does the patient require coordinated care of a physician, rehab nurse, PT (1-2 hrs/day, 5 days/week), OT (1-2 hrs/day, 5 days/week) and SLP (1-2 hrs/day, 5 days/week) to address physical and functional deficits in the context of the above medical diagnosis(es)? Yes Addressing deficits in the following areas: balance, endurance, locomotion, strength, transferring, bowel/bladder control, bathing, dressing, feeding, grooming, toileting, cognition, speech and psychosocial support 5. Can the patient actively participate in an intensive therapy program of at least 3 hrs of therapy 5 days a week? Yes 6. The potential for patient to make measurable gains while on inpatient rehab is excellent 7. Anticipated functional outcomes upon discharge from inpatients are: supervision and min assist PT, supervision and min assist OT, supervision SLP 8.  Estimated rehab length of stay to reach the above functional goals is: 12-18 days 9. Anticipated D/C setting: Home 10. Anticipated post D/C treatments: HH therapy and Outpatient therapy 11. Overall Rehab/Functional Prognosis: excellent  MD Signature: Meredith Staggers, MD, [facility] 12/02/2018

## 2018-12-02 NOTE — Consult Note (Addendum)
ELECTROPHYSIOLOGY CONSULT NOTE  Patient ID: Deanna [name] MRN: [id number], DOB/AGE: January 15, 1951   Admit date: 11/29/2018 Date of Consult: 12/02/2018  Primary Physician: Darcus Austin, MD (Inactive) Primary Cardiologist: none Reason for Consultation: Cryptogenic stroke ; recommendations regarding Implantable Loop Recorder, requested by Dr. Leonie Man  History of Present Illness Tashena Ibach was admitted on 11/29/2018 with L sided numbness, and deviated gaze, stroke.  She had tPA and emergent IR with revascularization of occluded Rt MCA  .  PMHx includes: pre-DM and asthma only Neurology noted:Large Right MCA and 2 punctate L frontotemporal infarcts s/p tPA & IR w/ R MCA revascularization - embolic - etiology unknown .  she has undergone workup for stroke including echocardiogram and carotid angio.  The patient has been monitored on telemetry which has demonstrated sinus rhythm with no arrhythmias.   Inpatient stroke work-up is to be completed with a TEE.   Echocardiogram this admission demonstrated   IMPRESSIONS  1. The left ventricle has hyperdynamic systolic function, with an ejection fraction of >65%. The cavity size was normal. Left ventricular diastolic Doppler parameters are consistent with impaired relaxation.  2. The right ventricle has normal systolic function. The cavity was normal.  3. The mitral valve is abnormal. There is moderate mitral annular calcification present.  4. The tricuspid valve is grossly normal.  5. The aortic valve is tricuspid. No stenosis of the aortic valve.  6. The aortic root is normal in size and structure.  7. Vigorous LV systolic function; mild diastolic dysfunction; trace TR.  FINDINGS  Left Ventricle: The left ventricle has hyperdynamic systolic function, with an ejection fraction of >65%. The cavity size was normal. There is no increase in left ventricular wall thickness. Left ventricular diastolic Doppler parameters are consistent  with impaired  relaxation.  Right Ventricle: The right ventricle has normal systolic function. The cavity was normal.  Left Atrium: Left atrial size was normal in size.  Right Atrium: Right atrial size was normal in size.  Interatrial Septum: No atrial level shunt detected by color flow Doppler.  Pericardium: There is no evidence of pericardial effusion.  Mitral Valve: The mitral valve is abnormal. There is moderate mitral annular calcification present. Mitral valve regurgitation is not visualized by color flow Doppler.  Tricuspid Valve: The tricuspid valve is grossly normal. Tricuspid valve regurgitation is trivial by color flow Doppler.  Aortic Valve: The aortic valve is tricuspid Aortic valve regurgitation was not visualized by color flow Doppler. There is No stenosis of the aortic valve.  Pulmonic Valve: The pulmonic valve was normal in structure. Pulmonic valve regurgitation is not visualized by color flow Doppler.  Aorta: The aortic root is normal in size and structure.  Venous: The inferior vena cava is normal in size with greater than 50% respiratory variability.  Additional Comments: Vigorous LV systolic function; mild diastolic dysfunction; trace TR.   Lab work is reviewed.   Prior to admission, the patient denies chest pain, shortness of breath, dizziness, palpitations, or syncope.  They are recovering from their stroke with plans to CIR at discharge, d/w Dr. Leonie Man is ready for today, rehab admission as in process.   Past Medical History:  Diagnosis Date   Arthritis    osteoarthritis in  both knees   Asthma    Difficult intravenous access    GERD (gastroesophageal reflux disease)    Heart murmur    born with years ago   Pre-diabetes    last A1C=6.0 per pt   Torn meniscus  left knee w fracture x2 to left knee   Wears glasses      Surgical History:  Past Surgical History:  Procedure Laterality Date   CHOLECYSTECTOMY     CHONDROPLASTY Right  07/09/2017   Procedure: CHONDROPLASTY;  Surgeon: Sydnee Cabal, MD;  Location: [facility];  Service: Orthopedics;  Laterality: Right;   HERNIA REPAIR Right age 68   inguinal   KNEE ARTHROSCOPY WITH MEDIAL MENISECTOMY Left 01/10/2017   Procedure: LEFT KNEE ARTHROSCOPY, DEBRIDEMENT, PARTIAL MEDIAL MENISCECTOMY, CHONDROPLASTY, ARTHROSCOPIC ASSISTED INTERNAL FIXATION OF MEDIAL FEMORAL CONDYLE AND MEDIAL TIBIAL PLATEAU INSUFFICIENCY FRACTURES;  Surgeon: Sydnee Cabal, MD;  Location: [place];  Service: Orthopedics;  Laterality: Left;   KNEE ARTHROSCOPY WITH MEDIAL MENISECTOMY Right 07/09/2017   Procedure: Right knee scope, debridement, chondroplasty, partial meniscectomy, internal arthroscopic assisted internal fixation of medial tibial plateau fracture;  Surgeon: Sydnee Cabal, MD;  Location: [facility];  Service: Orthopedics;  Laterality: Right;   RADIOLOGY WITH ANESTHESIA N/A 11/29/2018   Procedure: RADIOLOGY WITH ANESTHESIA;  Surgeon: Luanne Bras, MD;  Location: [place];  Service: Radiology;  Laterality: N/A;   TUBAL LIGATION  age 68     Medications Prior to Admission  Medication Sig Dispense Refill Last Dose   meloxicam (MOBIC) 7.5 MG tablet Take 7.5 mg by mouth daily as needed for muscle spasms.   Past Month at Unknown time   ranitidine (ZANTAC) 150 MG tablet Take 150 mg by mouth daily as needed for heartburn.    Past Month at Unknown time    Inpatient Medications:    stroke: mapping our early stages of recovery book   Does not apply Once   aspirin  81 mg Oral Daily   atorvastatin  40 mg Oral q1800   bethanechol  5 mg Oral TID   Chlorhexidine Gluconate Cloth  6 each Topical Q0600   pantoprazole  40 mg Oral QHS    Allergies: No Known Allergies  Social History   Socioeconomic History   Marital status: Married    Spouse name: Not on file   Number of children: Not on file   Years of education: Not on file    Highest education level: Not on file  Occupational History   Not on file  Social Needs   Financial resource strain: Not on file   Food insecurity    Worry: Not on file    Inability: Not on file   Transportation needs    Medical: Not on file    Non-medical: Not on file  Tobacco Use   Smoking status: Never Smoker   Smokeless tobacco: Never Used  Substance and Sexual Activity   Alcohol use: No   Drug use: No   Sexual activity: Not on file  Lifestyle   Physical activity    Days per week: Not on file    Minutes per session: Not on file   Stress: Not on file  Relationships   Social connections    Talks on phone: Not on file    Gets together: Not on file    Attends religious service: Not on file    Active member of club or organization: Not on file    Attends meetings of clubs or organizations: Not on file    Relationship status: Not on file   Intimate partner violence    Fear of current or ex partner: Not on file    Emotionally abused: Not on file    Physically abused: Not on  file    Forced sexual activity: Not on file  Other Topics Concern   Not on file  Social History Narrative   Not on file     Family history her father and an older brother have had strokes.    Review of Systems: All other systems reviewed and are otherwise negative except as noted above.  Physical Exam: Vitals:   12/02/18 0600 12/02/18 0700 12/02/18 0800 12/02/18 0820  BP: (!) 152/63 138/60  129/61  Pulse: 87 83 79 77  Resp: 10 11 16 15   Temp:   98.4 F (36.9 C)   TempSrc:   Oral   SpO2: 94% 97% 96% 95%  Weight:      Height:        GEN- The patient is well appearing, alert and oriented x 3 today.   Head- normocephalic, atraumatic Eyes-  Sclera clear, conjunctiva pink Ears- hearing intact Oropharynx- clear Neck- supple Lungs- CTA b/l, normal work of breathing Heart- RRR, no murmurs, rubs or gallops  GI- soft, NT, ND Extremities- no clubbing, cyanosis, or edema MS- no  significant deformity or atrophy Skin- no rash or lesion Psych- euthymic mood, full affect   Labs:   Lab Results  Component Value Date   WBC 9.8 12/02/2018   HGB 13.4 12/02/2018   HCT 39.6 12/02/2018   MCV 100.0 12/02/2018   PLT 272 12/02/2018    Recent Labs  Lab 11/29/18 1955  12/02/18 0113  NA 138   < > 137  K 4.1   < > 3.7  CL 101   < > 105  CO2 26   < > 24  BUN 16   < > 9  CREATININE 0.94   < > 0.84  CALCIUM 9.2   < > 8.9  PROT 6.1*  --   --   BILITOT 0.3  --   --   ALKPHOS 82  --   --   ALT 19  --   --   AST 21  --   --   GLUCOSE 114*   < > 118*   < > = values in this interval not displayed.   No results found for: CKTOTAL, CKMB, CKMBINDEX, TROPONINI Lab Results  Component Value Date   CHOL 173 11/30/2018   Lab Results  Component Value Date   HDL 53 11/30/2018   Lab Results  Component Value Date   LDLCALC 103 (H) 11/30/2018   Lab Results  Component Value Date   TRIG 83 11/30/2018   Lab Results  Component Value Date   CHOLHDL 3.3 11/30/2018   No results found for: LDLDIRECT  No results found for: DDIMER   Radiology/Studies:   Ct Angio Head W Or Wo Contrast Result Date: 11/29/2018 CLINICAL DATA:  Rightward gaze.  Left-sided numbness. EXAM: CT ANGIOGRAPHY HEAD AND NECK CT PERFUSION BRAIN TECHNIQUE: Multidetector CT imaging of the head and neck was performed using the standard protocol during bolus administration of intravenous contrast. Multiplanar CT image reconstructions and MIPs were obtained to evaluate the vascular anatomy. Carotid stenosis measurements (when applicable) are obtained utilizing NASCET criteria, using the distal internal carotid diameter as the denominator. Multiphase CT imaging of the brain was performed following IV bolus contrast injection. Subsequent parametric perfusion maps were calculated using RAPID software. CONTRAST:  161mL OMNIPAQUE IOHEXOL 350 MG/ML SOLN COMPARISON:  None. FINDINGS: CTA NECK FINDINGS Suboptimal arterial  opacification. Aortic arch: Normal variant aortic arch branching pattern with common origin of the brachiocephalic and left common  carotid arteries. Widely patent arch vessel origins. Right carotid system: Patent with moderate calcified plaque at the carotid bifurcation. No evidence of significant stenosis or dissection. Retropharyngeal course of the proximal ICA. Left carotid system: Patent with moderate calcified plaque at the carotid bifurcation resulting in less than 50% proximal ICA stenosis. No evidence of dissection. Retropharyngeal course of the proximal ICA. Vertebral arteries: Patent and small bilaterally with the right being particularly hypoplastic. Small vessel size, suboptimal arterial opacification, image noise through the lower neck, and metallic dental streak artifact limits assessment for stenosis in the V1 and V2 segments, particularly on the right. Skeleton: Focally advanced disc degeneration at C5-6 and C6-7 with moderate neural foraminal stenosis due to uncovertebral spurring. Other neck: No evidence of cervical lymphadenopathy or mass. Upper chest: Clear lung apices. Review of the MIP images confirms the above findings CTA HEAD FINDINGS Anterior circulation: The internal carotid arteries are patent from skull base to carotid termini with mild calcified plaque bilaterally not resulting in significant stenosis. The A1 and M1 segments are widely patent. There is proximal occlusion of the right M2 inferior division with minimal intermittent distal reconstitution. No aneurysm is identified. Posterior circulation: The intracranial vertebral arteries are patent to the basilar and small, right more so than left. Patent left PICA and right AICA origins are identified. SCA is are also grossly patent. 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 There are predominantly fetal type origins of both PCAs with bilateral PCA branch vessel irregularity and attenuation but no evidence of significant  proximal stenosis. No aneurysm is identified. Venous sinuses: Patent. 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 Review of the MIP images confirms the above findings CT Brain Perfusion Findings: ASPECTS: 6 CBF (<30%) Volume: 61mL Perfusion (Tmax>6.0s) volume: 74mL Mismatch Volume: 54mL Infarction Location: Right MCA territory predominantly in the parietal lobe. IMPRESSION: 1. Proximal right M2 occlusion. 2. Associated acute right MCA infarct with penumbra on CTP as above. 3. Moderate cervical carotid artery atherosclerosis without significant stenosis. These results were communicated by telephone to Dr. Rory Percy on 11/29/2018 at 7:53 p.m. Electronically Signed   By: Logan Bores M.D.   On: 11/29/2018 20:37     Ct Head Wo Contrast Result Date: 12/01/2018 CLINICAL DATA:  Altered mental status, increased left-sided facial drooping, headache, aphasia EXAM: CT HEAD WITHOUT CONTRAST TECHNIQUE: Contiguous axial images were obtained from the base of the skull through the vertex without intravenous contrast. COMPARISON:  MR brain, 11/30/2018, CT brain, 11/29/2018 FINDINGS: Brain: Redemonstrated large edematous infarction of the posterior right MCA territory, edema and hypodensity substantially increased compared to prior examination dated 11/29/2018. There is new hemorrhagic conversion anteriorly, noted in the deep white matter of the posterior frontal and temporal lobes about the insula (series 7, image 15). No significant midline shift or evidence of downward herniation. Vascular: No hyperdense vessel or unexpected calcification. Skull: Normal. Negative for fracture or focal lesion. Sinuses/Orbits: No acute finding. Other: None. IMPRESSION: 1. Redemonstrated large edematous infarction of the posterior right MCA territory, edema and hypodensity substantially increased compared to prior examination dated 11/29/2018. 2. There is new hemorrhagic conversion  anteriorly, noted in the deep white matter of the posterior frontal and temporal lobes about the insula (series 7, image 15). 3.  No significant midline shift or evidence of downward herniation. Electronically Signed   By: Eddie Candle M.D.   On: 12/01/2018 11:22     Ct Angio Neck W Or Wo Contrast Result Date: 11/29/2018 CLINICAL  DATA:  Rightward gaze.  Left-sided numbness. EXAM: CT ANGIOGRAPHY HEAD AND NECK CT PERFUSION BRAIN TECHNIQUE: Multidetector CT imaging of the head and neck was performed using the standard protocol during bolus administration of intravenous contrast. Multiplanar CT image reconstructions and MIPs were obtained to evaluate the vascular anatomy. Carotid stenosis measurements (when applicable) are obtained utilizing NASCET criteria, using the distal internal carotid diameter as the denominator. Multiphase CT imaging of the brain was performed following IV bolus contrast injection. Subsequent parametric perfusion maps were calculated using RAPID software. CONTRAST:  140mL OMNIPAQUE IOHEXOL 350 MG/ML SOLN COMPARISON:  None. FINDINGS: CTA NECK FINDINGS Suboptimal arterial opacification. Aortic arch: Normal variant aortic arch branching pattern with common origin of the brachiocephalic and left common carotid arteries. Widely patent arch vessel origins. Right carotid system: Patent with moderate calcified plaque at the carotid bifurcation. No evidence of significant stenosis or dissection. Retropharyngeal course of the proximal ICA. Left carotid system: Patent with moderate calcified plaque at the carotid bifurcation resulting in less than 50% proximal ICA stenosis. No evidence of dissection. Retropharyngeal course of the proximal ICA. Vertebral arteries: Patent and small bilaterally with the right being particularly hypoplastic. Small vessel size, suboptimal arterial opacification, image noise through the lower neck, and metallic dental streak artifact limits assessment for stenosis in the V1  and V2 segments, particularly on the right. Skeleton: Focally advanced disc degeneration at C5-6 and C6-7 with moderate neural foraminal stenosis due to uncovertebral spurring. Other neck: No evidence of cervical lymphadenopathy or mass. Upper chest: Clear lung apices. Review of the MIP images confirms the above findings CTA HEAD FINDINGS Anterior circulation: The internal carotid arteries are patent from skull base to carotid termini with mild calcified plaque bilaterally not resulting in significant stenosis. The A1 and M1 segments are widely patent. There is proximal occlusion of the right M2 inferior division with minimal intermittent distal reconstitution. No aneurysm is identified. Posterior circulation: The intracranial vertebral arteries are patent to the basilar and small, right more so than left. Patent left PICA and right AICA origins are identified. SCA is are also grossly patent. 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 There are predominantly fetal type origins of both PCAs with bilateral PCA branch vessel irregularity and attenuation but no evidence of significant proximal stenosis. No aneurysm is identified. Venous sinuses: Patent. 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 Review of the MIP images confirms the above findings CT Brain Perfusion Findings: ASPECTS: 6 CBF (<30%) Volume: 70mL Perfusion (Tmax>6.0s) volume: 91mL Mismatch Volume: 55mL Infarction Location: Right MCA territory predominantly in the parietal lobe. IMPRESSION: 1. Proximal right M2 occlusion. 2. Associated acute right MCA infarct with penumbra on CTP as above. 3. Moderate cervical carotid artery atherosclerosis without significant stenosis. These results were communicated by telephone to Dr. Rory Percy on 11/29/2018 at 7:53 p.m. Electronically Signed   By: Logan Bores M.D.   On: 11/29/2018 20:37     Mr Brain Wo Contrast Result Date: 11/30/2018 CLINICAL  DATA:  Follow-up right MCA infarct. Right M2 occlusion status post endovascular revascularization. EXAM: MRI HEAD WITHOUT CONTRAST TECHNIQUE: Multiplanar, multiecho pulse sequences of the brain and surrounding structures were obtained without intravenous contrast. COMPARISON:  Head CT, CTA, and CT perfusion 11/29/2018 FINDINGS: The study is mildly motion degraded. Brain: There is a moderate-sized acute right MCA infarct involving the parietal lobe, posterior temporal lobe, lateral occipital lobe, and posterior insula with associated confluent petechial hemorrhage at the level of the insula and posterior  operculum. There is associated cytotoxic edema without midline shift or other significant mass effect. Additional small acute infarcts are present in the anteroinferior right frontal lobe and posterior right frontal lobe. There are also punctate acute infarcts in the high posterior left frontal lobe and possibly left parietal lobe. There is no extra-axial fluid collection. Scattered small foci of T2 hyperintensity in the cerebral white matter bilaterally are nonspecific but compatible with mild chronic small vessel ischemic disease. Vascular: Major intracranial vascular flow voids are preserved. Skull and upper cervical spine: Unremarkable bone marrow signal. Sinuses/Orbits: Unremarkable orbits. Paranasal sinuses and mastoid air cells are clear. Other: None. IMPRESSION: 1. Moderate-sized acute right MCA infarct with confluent petechial hemorrhage. 2. One or two punctate acute left frontoparietal infarcts. 3. Mild chronic small vessel ischemic disease. Electronically Signed   By: Logan Bores M.D.   On: 11/30/2018 18:56    Dg Chest Port 1 View Result Date: 11/30/2018 CLINICAL DATA:  Elevated temperature. EXAM: PORTABLE CHEST 1 VIEW COMPARISON:  Radiographs of May 02, 2017. FINDINGS: Stable cardiomediastinal silhouette. No pneumothorax or pleural effusion is noted. Minimal bibasilar subsegmental atelectasis is  noted. The visualized skeletal structures are unremarkable. IMPRESSION: Minimal bibasilar subsegmental atelectasis. Electronically Signed   By: Marijo Conception M.D.   On: 11/30/2018 11:34   12-lead ECG SR All prior EKG's in EPIC reviewed with no documented atrial fibrillation  Telemetry SR, rare PVCs, PACs, rare very brief PAT, on 4 beat NSVT  Assessment and Plan:  1. Cryptogenic stroke The patient presents with cryptogenic stroke.  The patient has a TEE planned for this AM.  I spoke at length with the patient about the rational for monitoring for afib and options of either a 30 day event monitor or an implantable loop recorder.  Risks, benefits, and alteratives to implantable loop recorder were discussed with the patient today.   At this time, the patient is very clear in her decision to proceed with implantable loop recorder.   Wound care was reviewed with the patient (keep incision clean and dry for 3 days).  Wound check will  be scheduled for the patient  Please call with questions.  Renee Dyane Dustman, PA-C 12/02/2018   EP Attending  Patient seen and examined. Agree with above. The patient has had a cryptogenic stroke. She has undergone TEE and this demonstrated no explanation for the cause of her stroke. I have recommended she undergo insertion of an ILR. The indications/risks/benefits/goals/expectations of the procedure were reviewed and she wishes to proceed.   Mikle Bosworth.D.

## 2018-12-02 NOTE — Discharge Instructions (Signed)
Heart monitor implant site wound care instructions °Keep incision clean and dry for 3 days. °You can remove outer dressing tomorrow. °Leave steri-strips (little pieces of tape) on until seen in the office for wound check appointment. °Call the office ([phone]) for redness, drainage, swelling, or fever. ° °

## 2018-12-02 NOTE — Progress Notes (Signed)
Pt discharged to rehab. Diet placed to come once transferred. All belongings sent with pt. No issues during transfer.

## 2018-12-02 NOTE — Progress Notes (Signed)
STROKE TEAM PROGRESS NOTE   SUBJECTIVE (INTERVAL HISTORY) Patient is feeling better today.  She is sitting up in bed.  She still has right gaze preference left hemianopsia and mild left hemiparesis and sensory loss.  She is had TEE today which showed no evidence of PFO or clot.  She is being considered for transfer to inpatient rehab today.   OBJECTIVE Vitals:   12/02/18 0600 12/02/18 0700 12/02/18 0800 12/02/18 0820  BP: (!) 152/63 138/60  129/61  Pulse: 87 83 79 77  Resp: 10 11 16 15   Temp:   98.4 F (36.9 C)   TempSrc:   Oral   SpO2: 94% 97% 96% 95%  Weight:      Height:        CBC:  Recent Labs  Lab 11/29/18 1955 11/30/18 0736 12/02/18 0113  WBC 10.0 10.6* 9.8  NEUTROABS 6.3 9.3*  --   HGB 14.7 14.0 13.4  HCT 43.4 40.7 39.6  MCV 98.4 99.0 100.0  PLT 261 286 240    Basic Metabolic Panel:  Recent Labs  Lab 11/30/18 0736 12/02/18 0113  NA 137 137  K 4.3 3.7  CL 103 105  CO2 23 24  GLUCOSE 180* 118*  BUN 12 9  CREATININE 0.90 0.84  CALCIUM 9.0 8.9    IMAGING Ct Head Code Stroke Wo Contrast 11/29/2018 1. Acute posterior right MCA infarct with hyperdense right M2 branches. No hemorrhage.  2. ASPECTS is 6.   Ct Angio Head W Or Wo Contrast Ct Angio Neck W Or Wo Contrast Ct Cerebral Perfusion W Contrast 11/29/2018 1. Proximal right M2 occlusion.  2. Associated acute right MCA infarct with penumbra on CTP as above.  3. Moderate cervical carotid artery atherosclerosis without significant stenosis.   Interventional Radiology S/P RT common carotid arterriogram followed  By revascularization of occluded Rt MCA inf division with x 1 pass with 60mm x 50mm embotrap retriever device and penumbra aspiration achieving a TICI 2b+(TICI 2c ) revascularization. S.Deveshwar MD  Mr Brain Wo Contrast 11/30/2018 1. Moderate-sized acute right MCA infarct with confluent petechial hemorrhage. 2. One or two punctate acute left frontoparietal infarcts. 3. Mild chronic small vessel  ischemic disease.    Ct Head Wo Contrast 12/01/2018 1. Redemonstrated large edematous infarction of the posterior right MCA territory, edema and hypodensity substantially increased compared to prior examination dated 11/29/2018. 2. There is new hemorrhagic conversion anteriorly, noted in the deep white matter of the posterior frontal and temporal lobes about the insula (series 7, image 15). 3.  No significant midline shift or evidence of downward herniation.    Portable CXR 11/30/18 Minimal bibasilar subsegmental atelectasis.  Transthoracic Echocardiogram  11/30/2018  1. The left ventricle has hyperdynamic systolic function, with an ejection fraction of >65%. The cavity size was normal. Left ventricular diastolic Doppler parameters are consistent with impaired relaxation.  2. The right ventricle has normal systolic function. The cavity was normal.  3. The mitral valve is abnormal. There is moderate mitral annular calcification present.  4. The tricuspid valve is grossly normal.  5. The aortic valve is tricuspid. No stenosis of the aortic valve.  6. The aortic root is normal in size and structure.  7. Vigorous LV systolic function; mild diastolic dysfunction; trace TR.  TEE No PFO with rest or valsalva No LAA thrombus No left sided valvular lesions Aortic atherosclerosis present in aortic arch  EKG - SR rate 96 BPM. (See cardiology reading for complete details)   PHYSICAL EXAM  Pleasant middle-age Caucasian lady not in distress. Afebrile. Head is nontraumatic. Neck is supple without bruit.    Cardiac exam no murmur or gallop. Lungs are clear to auscultation. Distal pulses are well felt. Neurological Exam : Awake alert oriented to time place and person.  Right gaze preference.  Able to look to the left past midline .  Dense left homonymous hemianopsia.  Speech is clear with mild dysarthria .no aphasia or apraxia.  Pupils equal reactive.  Fundi not visualized.  Mild left lower facial  weakness.  Tongue midline.  Motor system exam reveals mild left upper extremity drift and moderate weakness of left grip and intrinsic hand muscles.  Orbits right over left upper extremity.  Mild left lower extremity weakness but cooperation is variable diminished left hemibody sensation to touch and pinprick.  Deep tendon reflexes symmetric.  Plantars are downgoing.  Gait not tested.    ASSESSMENT/PLAN Ms. Perl Folmar is a 68 y.o. female with a history of pre diabetes and asthma but on no medications, presenting with left-sided tingling and right gaze preference.  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  CTA H&N - Proximal right M2 occlusion.   CT Penumbra - Associated acute right MCA infarct with penumbra.   Cerebral angio TICI2b+ revascularization of occluded Rt MCA inf division with x 1 pass embotrap and penumbra.  Post IR CT no gross hmg, contrast blush present R post frontal parietal region  MRI head -mod R MCA infarct w/ confluent petechial hmg. 2 small L punctate  Frontotemporal infarcts   Repeat stat CT 7/20 stable, no significant change  2D Echo EF > 65%. No source of embolus   TEE no PFO or SOE  Loop placed 12/02/2018  [name] Virus 2 - negative  LDL - 103  HgbA1c - 5.9  UDS - not ordered  VTE prophylaxis - SCDs  No antithrombotic prior to admission, now on aspirin 81 mg daily post tPA  Therapy recommendations:  CIR  Disposition:  Pending  Transfer to the floor  Worsening L HP, HA, Nausea 7/20  Stat CT stable  bolused w/ IVF  zofran prn  Hypertension  Stable  Treated with Cleviprex, now off . SBP goal < 160 mm given Hg post tPA . Prn hydralazine for  elevated BP . Long-term BP goal normotensive  Hyperlipidemia  Lipid lowering medication PTA:  none  LDL 103, goal < 70  Current lipid lowering medication:  lipitor 40  Continue statin at discharge  Pre Diabetes  HgbA1c 5.9, goal < 7.0  Other Stroke Risk Factors  Advanced age  Morbid Obesity, Body mass index is 46.4 kg/m., recommend weight loss, diet and exercise as appropriate   Other Active Problems  Chronic back pain, prn pain control  Mild leukocytosis, resolved - 10.6->9.8 - afebrile - U/A not c/w UTI, CXR with subsegmental atx  Urinary retention. I&O cath x 3. Put on urecholine 5 mg tid.    Hospital day # 3 Mobilize out of bed.  Transfer to inpatient rehab when bed available.  Discussed with patient and husband and answered questions.  She will need to loop recorder and outpatient follow-up in the stroke clinic in 6 weeks.  May consider  possible participation in the [place] trial if interested is constantly say  Antony Contras, [place] Pager: [phone] 12/02/2018 9:14 AM   To contact Stroke Continuity provider, please refer to [email or web address] After hours, contact General Neurology

## 2018-12-02 NOTE — H&P (Signed)
Physical Medicine and Rehabilitation Admission H&P    Chief Complaint  Patient presents with   Functional deficits due to stroke    HPI: Deanna [name] is a 68 year old female with history of OA, asthma, morbid obesity, prediabetes who was admitted on 11/29/18 with left sided tingling with LLE weakness and right gaze preference.  CT head showed acute R-MCA infarct with hyperdense sign and was treated with tPA.  CTA  Head/neck with perfusion showed proximal right M2 occlusion with associated acute R-MCA infarct with penumbra and moderate cervical carotid artery atherosclerosis. She underwent cerebral angio with endovascular revascularization of R-MCA by Dr. Estanislado Pandy.   Follow up MRI brain showed moderate acute R0MCA infarct with petechial hemorrhage and one or two acute left frontoparietal infarcts. Dr.Sethi felt that stroke was embolic due to unknown source and recommended  ASA for secondary prevention and TEE for work up.  On 07/20,  she had increase in left sided weakness with nausea and follow up CT  Head showed large edematous infarction in R-MCA with substantial increase in hypodensity and new  Hemorrhagic conversion in deep white matter of posterior frontal and temporal lobes.   She was treated with fluid bolus and recommendations to keep SBP < 160.   She underwent TEE with loop recorder placement today. TEE revealed aortic atherosclerosis in aortic arch and was negative for PFO or LAA thrombus.  She has had urinary retention requiring I/O caths as well as issues with back pain and mild leucocytosis. She was started on low dose urecholine and foley placed  12/01/18. Patient with resultant left sided weakness with sensory deficits, left inattention, left hemiopsia with right gaze preference, difficulty with initiation. Problems with sequencing and poor awareness of deficits.    Review of Systems  Constitutional: Negative for chills and fever.  HENT: Negative for hearing loss.   Eyes:  Positive for blurred vision. Negative for double vision.  Respiratory: Negative for cough and shortness of breath.   Cardiovascular: Negative for chest pain and palpitations.  Gastrointestinal: Positive for constipation. Negative for heartburn and nausea.  Genitourinary: Negative for dysuria.  Musculoskeletal: Positive for back pain, joint pain (bilateral knee pain) and myalgias.  Skin: Negative for itching and rash.  Neurological: Positive for sensory change and focal weakness.  Psychiatric/Behavioral: Negative for memory loss. The patient is not nervous/anxious.      Past Medical History:  Diagnosis Date   Arthritis    osteoarthritis in  both knees   Asthma    Difficult intravenous access    GERD (gastroesophageal reflux disease)    Heart murmur    born with years ago   Pre-diabetes    last A1C=6.0 per pt   Torn meniscus    left knee w fracture x2 to left knee   Wears glasses     Past Surgical History:  Procedure Laterality Date   CHOLECYSTECTOMY     CHONDROPLASTY Right 07/09/2017   Procedure: CHONDROPLASTY;  Surgeon: Sydnee Cabal, MD;  Location: [facility];  Service: Orthopedics;  Laterality: Right;   HERNIA REPAIR Right age 62   inguinal   IR CT HEAD LTD  11/29/2018   IR PERCUTANEOUS ART THROMBECTOMY/INFUSION INTRACRANIAL INC DIAG ANGIO  11/29/2018   KNEE ARTHROSCOPY WITH MEDIAL MENISECTOMY Left 01/10/2017   Procedure: LEFT KNEE ARTHROSCOPY, DEBRIDEMENT, PARTIAL MEDIAL MENISCECTOMY, CHONDROPLASTY, ARTHROSCOPIC ASSISTED INTERNAL FIXATION OF MEDIAL FEMORAL CONDYLE AND MEDIAL TIBIAL PLATEAU INSUFFICIENCY FRACTURES;  Surgeon: Sydnee Cabal, MD;  Location: [facility];  Service: Orthopedics;  Laterality: Left;   KNEE ARTHROSCOPY WITH MEDIAL MENISECTOMY Right 07/09/2017   Procedure: Right knee scope, debridement, chondroplasty, partial meniscectomy, internal arthroscopic assisted internal fixation of medial tibial plateau fracture;   Surgeon: Sydnee Cabal, MD;  Location: [facility];  Service: Orthopedics;  Laterality: Right;   RADIOLOGY WITH ANESTHESIA N/A 11/29/2018   Procedure: RADIOLOGY WITH ANESTHESIA;  Surgeon: Luanne Bras, MD;  Location: [facility];  Service: Radiology;  Laterality: N/A;   TUBAL LIGATION  age 65    Family History  Problem Relation Age of Onset   Stroke Father    Stroke Brother      Social History:  Married. Retired--used to work for [employer]. Independent PTA. She  reports that she has never smoked. She has never used smokeless tobacco. She reports that she does not drink alcohol or use drugs.    Allergies: No Known Allergies    Medications Prior to Admission  Medication Sig Dispense Refill   meloxicam (MOBIC) 7.5 MG tablet Take 7.5 mg by mouth daily as needed for muscle spasms.     ranitidine (ZANTAC) 150 MG tablet Take 150 mg by mouth daily as needed for heartburn.       Drug Regimen Review  Drug regimen was reviewed and remains appropriate with no significant issues identified  Home: Home Living Family/patient expects to be discharged to:: Private residence Living Arrangements: Spouse/significant other Available Help at Discharge: (spouse part time pastor, he can work from home; 3 daughters,) Type of Home: House Home Access: Stairs to enter Technical brewer of Steps: 2 Entrance Stairs-Rails: Right [place]: One level Bathroom Shower/Tub: Multimedia programmer: Handicapped height Bathroom Accessibility: Yes Additional Comments: spouse and 3 daughters, one lives down the street is Therapist, sports works from home  Lives With: Spouse   Functional History: Prior Function Level of Independence: Independent Comments: Loves to cook and clean, drives . Has daughters who can help at home as well.  Functional Status:  Mobility: Bed Mobility Overal bed mobility: Needs Assistance Bed Mobility: Rolling, Sidelying to Sit Rolling: Mod assist Sidelying  to sit: Mod assist, HOB elevated General bed mobility comments: Able to bring LLE to EOB only needing assist for last little bit; assist with scooting bottom and elevating trunk. Heavy use of rail. Cues to push up through RUE. Transfers Overall transfer level: Needs assistance Equipment used: 2 person hand held assist Transfers: Sit to/from Stand, Stand Pivot Transfers Sit to Stand: Max assist, +2 physical assistance Stand pivot transfers: Mod assist, +2 physical assistance, +2 safety/equipment General transfer comment: Assist of 2 to power to standing- difficulty with initiation. Stood from [employer], from [facility] x1. SPT bed to [facility] with assist for weightshifting and momentum. Ambulation/Gait Ambulation/Gait assistance: Mod assist, +2 physical assistance, +2 safety/equipment Gait Distance (Feet): 3 Feet Assistive device: 2 person hand held assist Gait Pattern/deviations: Trunk flexed, Step-to pattern General Gait Details: Able to take a few steps to get to chair with assist for weight shifting and momentum to move towards right. Poor sequencing. Poor awareness of left side of body; flexed posture. Gait velocity: decreased    ADL: ADL Overall ADL's : Needs assistance/impaired Eating/Feeding: Minimal assistance, Sitting Eating/Feeding Details (indicate cue type and reason): needs mod cues to scan the entire tray and locate all the times. pt able to recall a tray is square and locate all 4 corners Grooming: Wash/dry hands, Set up Grooming Details (indicate cue type and reason): provided hand sanitizer Toilet Transfer: +2 for physical  assistance, +2 for safety/equipment, Maximal assistance General ADL Comments: pt on BSC on arrival and transfered to chair for eating breakfast. pt reports sitting int he chair will help her back  Cognition: Cognition Overall Cognitive Status: Impaired/Different from baseline Orientation Level: Oriented X4 Cognition Arousal/Alertness: Awake/alert Behavior During  Therapy: WFL for tasks assessed/performed Overall Cognitive Status: Impaired/Different from baseline Area of Impairment: Awareness, Problem solving, Safety/judgement Safety/Judgement: Decreased awareness of deficits, Decreased awareness of safety Awareness: Intellectual Problem Solving: Decreased initiation, Difficulty sequencing, Requires verbal cues, Requires tactile cues General Comments: lack of awareness to deficits. Pt report could do it if back didnt hurt. Right gaze preference. Able to get to midline and gaze left on a few occasions with max cues.   Blood pressure (!) 120/43, pulse 81, temperature 98.2 F (36.8 C), temperature source Temporal, resp. rate 13, height 5\' 1"  (1.549 m), weight 111.4 kg, SpO2 95 %. Physical Exam  Nursing note and vitals reviewed. Constitutional: She is oriented to person, place, and time. She appears well-developed and well-nourished.  Obese female. NAD  HENT:  Head: Normocephalic.  Eyes: Pupils are equal, round, and reactive to light. EOM are normal.  Neck: Normal range of motion. No tracheal deviation present. No thyromegaly present.  Cardiovascular: Normal rate and regular rhythm. Exam reveals no friction rub.  No murmur heard. Respiratory: Effort normal and breath sounds normal. No respiratory distress. She has no wheezes.  GI: Soft. She exhibits no distension. There is no abdominal tenderness.  Genitourinary:    Genitourinary Comments: Foley in place.    Neurological: She is alert and oriented to person, place, and time.  Left central 7 and tongue deviation. Speech dysarthric. Fair insight and basic awareness. LUE and LLE tr to 0/5. Decreased LT left arm and leg and inattention.    .   Skin: Skin is warm and dry. No rash noted. No erythema.  Psychiatric:  Flat, cooperative    Results for orders placed or performed during the hospital encounter of 11/29/18 (from the past 48 hour(s))  CBC     Status: None   Collection Time: 12/02/18  1:13 AM    Result Value Ref Range   WBC 9.8 4.0 - 10.5 K/uL   RBC 3.96 3.87 - 5.11 MIL/uL   Hemoglobin 13.4 12.0 - 15.0 g/dL   HCT 39.6 36.0 - 46.0 %   MCV 100.0 80.0 - 100.0 fL   MCH 33.8 26.0 - 34.0 pg   MCHC 33.8 30.0 - 36.0 g/dL   RDW 13.3 11.5 - 15.5 %   Platelets 272 150 - 400 K/uL   nRBC 0.0 0.0 - 0.2 %    Comment: Performed at [facility], [place] [address], [place], [place] [address]  Basic metabolic panel     Status: Abnormal   Collection Time: 12/02/18  1:13 AM  Result Value Ref Range   Sodium 137 135 - 145 mmol/L   Potassium 3.7 3.5 - 5.1 mmol/L   Chloride 105 98 - 111 mmol/L   CO2 24 22 - 32 mmol/L   Glucose, Bld 118 (H) 70 - 99 mg/dL   BUN 9 8 - 23 mg/dL   Creatinine, Ser 0.84 0.44 - 1.00 mg/dL   Calcium 8.9 8.9 - 10.3 mg/dL   GFR calc non Af Amer >60 >60 mL/min   GFR calc Af Amer >60 >60 mL/min   Anion gap 8 5 - 15    Comment: Performed at [facility], [place],  [place] [address]  Protime-INR     Status: None   Collection Time: 12/02/18  1:13 AM  Result Value Ref Range   Prothrombin Time 13.9 11.4 - 15.2 seconds   INR 1.1 0.8 - 1.2    Comment: (NOTE) INR goal varies based on device and disease states. Performed at [facility], [place] [address] [place], [place] [address]    Ct Head Wo Contrast  Result Date: 12/01/2018 CLINICAL DATA:  Altered mental status, increased left-sided facial drooping, headache, aphasia EXAM: CT HEAD WITHOUT CONTRAST TECHNIQUE: Contiguous axial images were obtained from the base of the skull through the vertex without intravenous contrast. COMPARISON:  MR brain, 11/30/2018, CT brain, 11/29/2018 FINDINGS: Brain: Redemonstrated large edematous infarction of the posterior right MCA territory, edema and hypodensity substantially increased compared to prior examination dated 11/29/2018. There is new hemorrhagic conversion anteriorly, noted in the deep white matter of the posterior frontal and temporal lobes about the  insula (series 7, image 15). No significant midline shift or evidence of downward herniation. Vascular: No hyperdense vessel or unexpected calcification. Skull: Normal. Negative for fracture or focal lesion. Sinuses/Orbits: No acute finding. Other: None. IMPRESSION: 1. Redemonstrated large edematous infarction of the posterior right MCA territory, edema and hypodensity substantially increased compared to prior examination dated 11/29/2018. 2. There is new hemorrhagic conversion anteriorly, noted in the deep white matter of the posterior frontal and temporal lobes about the insula (series 7, image 15). 3.  No significant midline shift or evidence of downward herniation. Electronically Signed   By: Eddie Candle M.D.   On: 12/01/2018 11:22   Mr Brain Wo Contrast  Result Date: 11/30/2018 CLINICAL DATA:  Follow-up right MCA infarct. Right M2 occlusion status post endovascular revascularization. EXAM: MRI HEAD WITHOUT CONTRAST TECHNIQUE: Multiplanar, multiecho pulse sequences of the brain and surrounding structures were obtained without intravenous contrast. COMPARISON:  Head CT, CTA, and CT perfusion 11/29/2018 FINDINGS: The study is mildly motion degraded. Brain: There is a moderate-sized acute right MCA infarct involving the parietal lobe, posterior temporal lobe, lateral occipital lobe, and posterior insula with associated confluent petechial hemorrhage at the level of the insula and posterior operculum. There is associated cytotoxic edema without midline shift or other significant mass effect. Additional small acute infarcts are present in the anteroinferior right frontal lobe and posterior right frontal lobe. There are also punctate acute infarcts in the high posterior left frontal lobe and possibly left parietal lobe. There is no extra-axial fluid collection. Scattered small foci of T2 hyperintensity in the cerebral white matter bilaterally are nonspecific but compatible with mild chronic small vessel ischemic  disease. Vascular: Major intracranial vascular flow voids are preserved. Skull and upper cervical spine: Unremarkable bone marrow signal. Sinuses/Orbits: Unremarkable orbits. Paranasal sinuses and mastoid air cells are clear. Other: None. IMPRESSION: 1. Moderate-sized acute right MCA infarct with confluent petechial hemorrhage. 2. One or two punctate acute left frontoparietal infarcts. 3. Mild chronic small vessel ischemic disease. Electronically Signed   By: Logan Bores M.D.   On: 11/30/2018 18:56       Medical Problem List and Plan: 1.  Left hemiparesis and functional deficits secondary to right MCA infarct  -admit to inpatient rehab.  2.  Antithrombotics: -DVT/anticoagulation:  Mechanical: Sequential compression devices, below knee Bilateral lower extremities  -antiplatelet therapy: Low dose ASA.  3. OA bilateral knees/Pain Management: tylenol prn. Resume Voltaren gel  To bilateral knees.   4. Mood:  LCSW to follow for evaluation and support.   -antipsychotic  agents: N/A 5. Neuropsych: This patient is not fully capable of making decisions on on own behalf. 6. Skin/Wound Care: Routine pressure relief measures.  7. Fluids/Electrolytes/Nutrition: Monitor I/O. Check lytes in am.  8. HTN: Monitor BP tid--improved control with fewer fluctuations over last 48 hours. Would not add further bp medication at this point to avoid hypoperfusion.  9. Urinary retention: Check UCS. Will continue urecholine for voiding trial and titrate upwards as needed. Discontinue foley and start bladder training in am.  Set toileting schedule, monitor PVRs and I/O caths to keep volumes < 350cc 10. Prediabetes: Hgb A1c-5.9. CM diet.  11. Morbid obesity: Educate patient on CM/HH diet and importance of weight loss to help promote health and mobility.  12. Dyslipidemia: On Atorvastatin.        Bary Leriche, PA-C 12/02/2018

## 2018-12-02 NOTE — CV Procedure (Signed)
INDICATIONS: stroke  PROCEDURE:   Informed consent was obtained prior to the procedure. The risks, benefits and alternatives for the procedure were discussed and the patient comprehended these risks.  Risks include, but are not limited to, cough, sore throat, vomiting, nausea, somnolence, esophageal and stomach trauma or perforation, bleeding, low blood pressure, aspiration, pneumonia, infection, trauma to the teeth and death.    After a procedural time-out, the oropharynx was anesthetized with 20% benzocaine spray.   During this procedure the patient was administered a total of Versed 5 mg and Fentanyl 50 mg to achieve and maintain moderate conscious sedation.  The patient's heart rate, blood pressure, and oxygen saturationweare monitored continuously during the procedure. The period of conscious sedation was 30 minutes, of which I was present face-to-face 100% of this time.  The transesophageal probe was inserted in the esophagus and stomach without difficulty and multiple views were obtained.  The patient was kept under observation until the patient left the procedure room.  The patient left the procedure room in stable condition.   Agitated microbubble saline contrast was administered.  COMPLICATIONS:    There were no immediate complications.  FINDINGS:  No PFO with rest or valsalva No LAA thrombus No left sided valvular lesions Aortic atherosclerosis present in aortic arch  RECOMMENDATIONS:    Can proceed to loop implant if indicated.  Time Spent Directly with the Patient:  45 minutes   Loredana Medellin A Margaretann Loveless 12/02/2018, 1:19 PM

## 2018-12-02 NOTE — ED Notes (Signed)
Post tPA flush administered to clear IV tubing. 13ml NS given @ rate of tPA, 81mg /hr

## 2018-12-02 NOTE — Progress Notes (Signed)
Inpatient Rehabilitation Admissions Coordinator  Inpatient rehab consult received. I met with patient at bedside and also contacted her spouse by phone for rehab assessment. They prefer an inpt rehab admission and she is an excellent candidate to admit after workup complete today of TEE and LOOP. I discussed with both Dr. Leonie Man and Joseph Art with cardiology. I will make the arrangments to admit today.  Danne Baxter, RN, MSN Rehab Admissions Coordinator [phone] 12/02/2018 10:39 AM

## 2018-12-02 NOTE — Discharge Summary (Addendum)
Stroke Discharge Summary  Patient ID: Deanna [name]   MRN: [id number]      DOB: January 10, 1951  Date of Admission: 11/29/2018 Date of Discharge: 12/02/2018  Attending Physician:  Garvin Fila, MD, Stroke MD Consultant(s): Olene Craven) Estanislado Pandy, MD (Interventional Neuroradiologist), Cristopher Peru, MD (electrophysiology), Alease Medina, MD (cardiology  For TEE) Patient's PCP:  Darcus Austin, MD (Inactive)  Discharge Diagnoses:  Principal Problem:   Acute ischemic stroke [facility]) large R MCA   Of cryptogenic etiology s/p tPA and mechanical thrombectomy Active Problems:   Middle cerebral artery embolism, right   Acute left hemiparesis (HCC)   Hyperlipemia   Prediabetes   Morbid obesity ([place])   Chronic back pain   Urinary retention  Past Medical History:  Diagnosis Date  . Arthritis    osteoarthritis in  both knees  . Asthma   . Difficult intravenous access   . GERD (gastroesophageal reflux disease)   . Heart murmur    born with years ago  . Pre-diabetes    last A1C=6.0 per pt  . Torn meniscus    left knee w fracture x2 to left knee  . Wears glasses    Past Surgical History:  Procedure Laterality Date  . CHOLECYSTECTOMY    . CHONDROPLASTY Right 07/09/2017   Procedure: CHONDROPLASTY;  Surgeon: Sydnee Cabal, MD;  Location: [facility]  Service: Orthopedics;  Laterality: Right;  . HERNIA REPAIR Right age 28   inguinal  . IR CT HEAD LTD  11/29/2018  . IR PERCUTANEOUS ART THROMBECTOMY/INFUSION INTRACRANIAL INC DIAG ANGIO  11/29/2018  . KNEE ARTHROSCOPY WITH MEDIAL MENISECTOMY Left 01/10/2017   Procedure: LEFT KNEE ARTHROSCOPY, DEBRIDEMENT, PARTIAL MEDIAL MENISCECTOMY, CHONDROPLASTY, ARTHROSCOPIC ASSISTED INTERNAL FIXATION OF MEDIAL FEMORAL CONDYLE AND MEDIAL TIBIAL PLATEAU INSUFFICIENCY FRACTURES;  Surgeon: Sydnee Cabal, MD;  Location: [place];  Service: Orthopedics;  Laterality: Left;  . KNEE ARTHROSCOPY WITH MEDIAL MENISECTOMY Right  07/09/2017   Procedure: Right knee scope, debridement, chondroplasty, partial meniscectomy, internal arthroscopic assisted internal fixation of medial tibial plateau fracture;  Surgeon: Sydnee Cabal, MD;  Location: [facility];  Service: Orthopedics;  Laterality: Right;  . RADIOLOGY WITH ANESTHESIA N/A 11/29/2018   Procedure: RADIOLOGY WITH ANESTHESIA;  Surgeon: Luanne Bras, MD;  Location: [facility];  Service: Radiology;  Laterality: N/A;  . TUBAL LIGATION  age 68    Medications to be continued on Rehab Allergies as of 12/02/2018   No Known Allergies     Medication List    STOP taking these medications   meloxicam 7.5 MG tablet Commonly known as: MOBIC     TAKE these medications   aspirin 81 MG chewable tablet Chew 1 tablet (81 mg total) by mouth daily. Start taking on: December 03, 2018   atorvastatin 40 MG tablet Commonly known as: LIPITOR Take 1 tablet (40 mg total) by mouth daily at 6 PM.   bethanechol 5 MG tablet Commonly known as: URECHOLINE Take 1 tablet (5 mg total) by mouth 3 (three) times daily.   ranitidine 150 MG tablet Commonly known as: ZANTAC Take 150 mg by mouth daily as needed for heartburn.   senna-docusate 8.6-50 MG tablet Commonly known as: Senokot-S Take 1 tablet by mouth at bedtime as needed for mild constipation.   sodium chloride 0.9 % infusion Inject 75 mLs into the vein continuous.       LABORATORY STUDIES CBC    Component Value Date/Time   WBC 9.8 12/02/2018 0113   RBC  3.96 12/02/2018 0113   HGB 13.4 12/02/2018 0113   HCT 39.6 12/02/2018 0113   PLT 272 12/02/2018 0113   MCV 100.0 12/02/2018 0113   MCH 33.8 12/02/2018 0113   MCHC 33.8 12/02/2018 0113   RDW 13.3 12/02/2018 0113   LYMPHSABS 1.0 11/30/2018 0736   MONOABS 0.2 11/30/2018 0736   EOSABS 0.0 11/30/2018 0736   BASOSABS 0.0 11/30/2018 0736   CMP    Component Value Date/Time   NA 137 12/02/2018 0113   K 3.7 12/02/2018 0113   CL 105 12/02/2018 0113    CO2 24 12/02/2018 0113   GLUCOSE 118 (H) 12/02/2018 0113   BUN 9 12/02/2018 0113   CREATININE 0.84 12/02/2018 0113   CALCIUM 8.9 12/02/2018 0113   PROT 6.1 (L) 11/29/2018 1955   ALBUMIN 3.5 11/29/2018 1955   AST 21 11/29/2018 1955   ALT 19 11/29/2018 1955   ALKPHOS 82 11/29/2018 1955   BILITOT 0.3 11/29/2018 1955   GFRNONAA >60 12/02/2018 0113   GFRAA >60 12/02/2018 0113   COAGS Lab Results  Component Value Date   INR 1.1 12/02/2018   INR 1.1 11/30/2018   Lipid Panel    Component Value Date/Time   CHOL 173 11/30/2018 0736   TRIG 83 11/30/2018 0736   HDL 53 11/30/2018 0736   CHOLHDL 3.3 11/30/2018 0736   VLDL 17 11/30/2018 0736   LDLCALC 103 (H) 11/30/2018 0736   HgbA1C  Lab Results  Component Value Date   HGBA1C 5.9 (H) 11/30/2018   Urinalysis    Component Value Date/Time   COLORURINE YELLOW 11/29/2018 2036   APPEARANCEUR CLEAR 11/29/2018 2036   LABSPEC >1.046 (H) 11/29/2018 2036   PHURINE 6.0 11/29/2018 2036   GLUCOSEU NEGATIVE 11/29/2018 2036   HGBUR SMALL (A) 11/29/2018 2036   BILIRUBINUR NEGATIVE 11/29/2018 2036   KETONESUR NEGATIVE 11/29/2018 2036   PROTEINUR NEGATIVE 11/29/2018 2036   NITRITE NEGATIVE 11/29/2018 2036   LEUKOCYTESUR NEGATIVE 11/29/2018 2036    SIGNIFICANT DIAGNOSTIC STUDIES Ct Head Code Stroke Wo Contrast 11/29/2018 1. Acute posterior right MCA infarct with hyperdense right M2 branches. No hemorrhage.  2. ASPECTS is 6.   Ct Angio Head W Or Wo Contrast Ct Angio Neck W Or Wo Contrast Ct Cerebral Perfusion W Contrast 11/29/2018 1. Proximal right M2 occlusion.  2. Associated acute right MCA infarct with penumbra on CTP as above.  3. Moderate cervical carotid artery atherosclerosis without significant stenosis.   Interventional Radiology S/P RT common carotid arterriogram followed By revascularization of occluded Rt MCA inf division with x 1 pass with 24mm x 57mm embotrap retriever device and penumbra aspiration achieving a TICI  2b+(TICI 2c ) revascularization. S.Deveshwar MD  Mr Brain Wo Contrast 11/30/2018 1. Moderate-sized acute right MCA infarct with confluent petechial hemorrhage. 2. One or two punctate acute left frontoparietal infarcts. 3. Mild chronic small vessel ischemic disease.    Ct Head Wo Contrast 12/01/2018 1. Redemonstrated large edematous infarction of the posterior right MCA territory, edema and hypodensity substantially increased compared to prior examination dated 11/29/2018. 2. There is new hemorrhagic conversion anteriorly, noted in the deep white matter of the posterior frontal and temporal lobes about the insula (series 7, image 15). 3.  No significant midline shift or evidence of downward herniation.    Portable CXR 11/30/18 Minimal bibasilar subsegmental atelectasis.  Transthoracic Echocardiogram  11/30/2018 1. The left ventricle has hyperdynamic systolic function, with an ejection fraction of >65%. The cavity size was normal. Left ventricular diastolic Doppler parameters are consistent  with impaired relaxation. 2. The right ventricle has normal systolic function. The cavity was normal. 3. The mitral valve is abnormal. There is moderate mitral annular calcification present. 4. The tricuspid valve is grossly normal. 5. The aortic valve is tricuspid. No stenosis of the aortic valve. 6. The aortic root is normal in size and structure. 7. Vigorous LV systolic function; mild diastolic dysfunction; trace TR.  EKG - SR rate 96 BPM. (See cardiology reading for complete details)  TEE No PFO with rest or valsalva No LAA thrombus No left sided valvular lesions Aortic atherosclerosis present in aortic arch     HISTORY OF PRESENT ILLNESS Deanna [name] is a 68 y.o. female past medical history of no significant medical issues and on no medications, who comes in via EMS for evaluation of left-sided tingling and right gaze preference that was noted by family around 5:45 PM and according to  the husband she was last seen normal around 4:30 PM today on 11/29/2018. She had no preceding illnesses or sicknesses.  No cough cold flulike symptoms fevers chills. Denies any prior history of strokes.  Denies any past medical history for which she is on any medications. Denies any similar symptoms in the past.  Denies any TIA-like episodes. Reports of a minor headache that has been going on. Also reports that there was some odd behavior involving neglect of the left side at home right after it was noticed that she was complaining of the left-sided tingling.  At that time daughter also noticed that she was gazing more to the right and was not looking all the way to the left. Upon arrival to the ER, she was evaluated, NIH stroke scale 7. Premorbid modified Rankin scale (mRS):2. Risks benefits for TPA discussed with her and IV TPA administered.  CT angiogram obtained that showed a right M2 occlusion versus severe stenosis as well as a CT perfusion study which was obtained after a repeat attempt due to patient motion and cooperation, that revealed a favorable perfusion profile. She was sent to IR for mechanical thrombectomy.    HOSPITAL COURSE [name] is a 68 y.o. female with a history of pre diabetes and asthma but on no medications, presenting with left-sided tingling and right gaze preference.   She received IV t-PA Saturday 11/29/18 at [facility] TICI2b+ revascularization of occluded Rt MCA   Stroke:  Large Right MCA and 2 punctate L frontotemporal infarcts s/p tPA & IR w/ R MCA revascularization - embolic - etiology unknown  CT head - Acute posterior right MCA infarct with hyperdense right M2 branches.  CTA H&N - Proximal right M2 occlusion.   CT Penumbra - Associated acute right MCA infarct with penumbra.   Cerebral angio TICI2b+ revascularization of occluded Rt MCA inf division with x 1 pass embotrap and penumbra.  Post IR CT no gross hmg, contrast blush present R  post frontal parietal region  MRI head -mod R MCA infarct w/ confluent petechial hmg. 2 small L punctate  Frontotemporal infarcts   Repeat stat CT 7/20 stable, no significant change  2D Echo EF > 65%. No source of embolus   TEE no PFO, no SOE  Loop placed 12/02/2018  [name] Virus 2 - negative  LDL - 103  HgbA1c - 5.9  No antithrombotic prior to admission, now on aspirin 81 mg daily post tPA  Therapy recommendations:  CIR  Disposition:  CIR  Worsening L HP, HA, Nausea 7/20, improved  Stat CT stable  bolused w/ IVF  zofran prn  Hypertension  Stable  Treated with Cleviprex, now off  SBP goal < 160 mm given Hg post tPA  Prn hydralazine for elevated BP  Long-term BP goal normotensive  Hyperlipidemia  Lipid lowering medication PTA:  none  LDL 103, goal < 70  Current lipid lowering medication:  lipitor 40  Continue statin at discharge  Pre Diabetes  HgbA1c 5.9, goal < 7.0  Other Stroke Risk Factors  Advanced age  Morbid Obesity, Body mass index is 46.4 kg/m., recommend weight loss, diet and exercise as appropriate   Other Active Problems  Chronic back pain, prn pain control  Mild leukocytosis, resolved - 10.6->9.8 - afebrile - U/A not c/w UTI, CXR with subsegmental atx  Urinary retention. I&O cath x 3. Put on urecholine 5 mg tid.    DISCHARGE EXAM Blood pressure (!) 120/43, pulse 81, temperature 98.2 F (36.8 C), temperature source Temporal, resp. rate 13, height 5\' 1"  (1.549 m), weight 111.4 kg, SpO2 95 %. Pleasant middle-age Caucasian lady not in distress. Afebrile. Head is nontraumatic. Neck is supple without bruit.    Cardiac exam no murmur or gallop. Lungs are clear to auscultation. Distal pulses are well felt. Neurological Exam : Awake alert oriented to time place and person.  Right gaze preference.  Able to look to the left past midline .  Dense left homonymous hemianopsia.  Speech is clear with mild dysarthria .no aphasia or  apraxia.  Pupils equal reactive.  Fundi not visualized.  Mild left lower facial weakness.  Tongue midline.  Motor system exam reveals mild left upper extremity drift and moderate weakness of left grip and intrinsic hand muscles.  Orbits right over left upper extremity.  Mild left lower extremity weakness but cooperation is variable diminished left hemibody sensation to touch and pinprick.  Deep tendon reflexes symmetric.  Plantars are downgoing.  Gait not tested.  Discharge Diet  Regular thin liquids  DISCHARGE PLAN  Disposition:  Transfer to [facility] for ongoing PT, OT and ST  aspirin 81 mg daily for secondary stroke prevention. No DAPT d/t post tPA hemorrhage  Consider [place] study for cryptogenic stroke as outpatient  Recommend ongoing stroke risk factor control by Primary Care Physician at time of discharge from inpatient rehabilitation.  Follow-up Darcus Austin, MD (Inactive) in 2 weeks following discharge from rehab.  Follow-up in [facility] in 4 weeks following discharge from rehab, office to schedule an appointment.   35 minutes were spent preparing discharge.  Burnetta Sabin, MSN, APRN, ANVP-BC, AGPCNP-BC Advanced Practice Stroke Nurse [facility] for Schedule & Pager information 12/02/2018 3:45 PM  I have personally obtained history,examined this patient, reviewed notes, independently viewed imaging studies, participated in medical decision making and plan of care.ROS completed by me personally and pertinent positives fully documented  I have made any additions or clarifications directly to the above note. Agree with note above.    Antony Contras, MD Medical Director [facility] Pager: [phone] 12/02/2018 4:15 PM

## 2018-12-02 NOTE — Interval H&P Note (Signed)
History and Physical Interval Note:  12/02/2018 12:37 PM  Deanna [name]  has presented today for surgery, with the diagnosis of Stroke.  The various methods of treatment have been discussed with the patient and family. After consideration of risks, benefits and other options for treatment, the patient has consented to  Procedure(s): TRANSESOPHAGEAL ECHOCARDIOGRAM (TEE) (N/A) as a surgical intervention.  The patient's history has been reviewed, patient examined, no change in status, stable for surgery.  I have reviewed the patient's chart and labs.  Questions were answered to the patient's satisfaction.     Elouise [facility]

## 2018-12-02 NOTE — Progress Notes (Signed)
Echocardiogram Echocardiogram Transesophageal has been performed.  Deanna [name] 12/02/2018, 1:22 PM

## 2018-12-02 NOTE — Progress Notes (Signed)
PT Cancellation Note  Patient Details Name: Deanna [name] MRN: [id number] DOB: Aug 29, 1950   Cancelled Treatment:    Reason Eval/Treat Not Completed: Patient at procedure or test/unavailable Pt off floor for TEE and loop recorder placement. Plans to d/c to CIR today. Will follow if still in hospital.   [place] 12/02/2018, 1:03 PM Wray Kearns, PT, DPT Acute Rehabilitation Services Pager [phone] Office [phone]

## 2018-12-02 NOTE — H&P (Signed)
Physical Medicine and Rehabilitation Admission H&P        Chief Complaint  Patient presents with   Functional deficits due to stroke      HPI: Deanna [name] is a 68 year old female with history of OA, asthma, morbid obesity, prediabetes who was admitted on 11/29/18 with left sided tingling with LLE weakness and right gaze preference.  CT head showed acute R-MCA infarct with hyperdense sign and was treated with tPA.  CTA  Head/neck with perfusion showed proximal right M2 occlusion with associated acute R-MCA infarct with penumbra and moderate cervical carotid artery atherosclerosis. She underwent cerebral angio with endovascular revascularization of R-MCA by Dr. Estanislado Pandy.   Follow up MRI brain showed moderate acute R0MCA infarct with petechial hemorrhage and one or two acute left frontoparietal infarcts. Dr.Sethi felt that stroke was embolic due to unknown source and recommended  ASA for secondary prevention and TEE for work up.  On 07/20,  she had increase in left sided weakness with nausea and follow up CT  Head showed large edematous infarction in R-MCA with substantial increase in hypodensity and new  Hemorrhagic conversion in deep white matter of posterior frontal and temporal lobes.   She was treated with fluid bolus and recommendations to keep SBP < 160.    She underwent TEE with loop recorder placement today. TEE revealed aortic atherosclerosis in aortic arch and was negative for PFO or LAA thrombus.  She has had urinary retention requiring I/O caths as well as issues with back pain and mild leucocytosis. She was started on low dose urecholine and foley placed  12/01/18. Patient with resultant left sided weakness with sensory deficits, left inattention, left hemiopsia with right gaze preference, difficulty with initiation. Problems with sequencing and poor awareness of deficits.      Review of Systems  Constitutional: Negative for chills and fever.  HENT: Negative for hearing loss.     Eyes: Positive for blurred vision. Negative for double vision.  Respiratory: Negative for cough and shortness of breath.   Cardiovascular: Negative for chest pain and palpitations.  Gastrointestinal: Positive for constipation. Negative for heartburn and nausea.  Genitourinary: Negative for dysuria.  Musculoskeletal: Positive for back pain, joint pain (bilateral knee pain) and myalgias.  Skin: Negative for itching and rash.  Neurological: Positive for sensory change and focal weakness.  Psychiatric/Behavioral: Negative for memory loss. The patient is not nervous/anxious.           Past Medical History:  Diagnosis Date   Arthritis      osteoarthritis in  both knees   Asthma     Difficult intravenous access     GERD (gastroesophageal reflux disease)     Heart murmur      born with years ago   Pre-diabetes      last A1C=6.0 per pt   Torn meniscus      left knee w fracture x2 to left knee   Wears glasses            Past Surgical History:  Procedure Laterality Date   CHOLECYSTECTOMY       CHONDROPLASTY Right 07/09/2017    Procedure: CHONDROPLASTY;  Surgeon: Sydnee Cabal, MD;  Location: [facility];  Service: Orthopedics;  Laterality: Right;   HERNIA REPAIR Right age 51    inguinal   IR CT HEAD LTD   11/29/2018   IR PERCUTANEOUS ART THROMBECTOMY/INFUSION INTRACRANIAL INC DIAG ANGIO   11/29/2018   KNEE ARTHROSCOPY WITH  MEDIAL MENISECTOMY Left 01/10/2017    Procedure: LEFT KNEE ARTHROSCOPY, DEBRIDEMENT, PARTIAL MEDIAL MENISCECTOMY, CHONDROPLASTY, ARTHROSCOPIC ASSISTED INTERNAL FIXATION OF MEDIAL FEMORAL CONDYLE AND MEDIAL TIBIAL PLATEAU INSUFFICIENCY FRACTURES;  Surgeon: Sydnee Cabal, MD;  Location: [facility] [facility] [place] [facility];  Service: Orthopedics;  Laterality: Left;   KNEE ARTHROSCOPY WITH MEDIAL MENISECTOMY Right 07/09/2017    Procedure: Right knee scope, debridement, chondroplasty, partial meniscectomy, internal arthroscopic assisted internal  fixation of medial tibial plateau fracture;  Surgeon: Sydnee Cabal, MD;  Location: [facility];  Service: Orthopedics;  Laterality: Right;   RADIOLOGY WITH ANESTHESIA N/A 11/29/2018    Procedure: RADIOLOGY WITH ANESTHESIA;  Surgeon: Luanne Bras, MD;  Location: [facility];  Service: Radiology;  Laterality: N/A;   TUBAL LIGATION   age 1          Family History  Problem Relation Age of Onset   Stroke Father     Stroke Brother        Social History:  Married. Retired--used to work for [employer]. Independent PTA. She  reports that she has never smoked. She has never used smokeless tobacco. She reports that she does not drink alcohol or use drugs.      Allergies: No Known Allergies            Medications Prior to Admission  Medication Sig Dispense Refill   meloxicam (MOBIC) 7.5 MG tablet Take 7.5 mg by mouth daily as needed for muscle spasms.       ranitidine (ZANTAC) 150 MG tablet Take 150 mg by mouth daily as needed for heartburn.          Drug Regimen Review  Drug regimen was reviewed and remains appropriate with no significant issues identified   Home: Home Living Family/patient expects to be discharged to:: Private residence Living Arrangements: Spouse/significant other Available Help at Discharge: (spouse part time pastor, he can work from home; 3 daughters,) Type of Home: House Home Access: Stairs to enter Technical brewer of Steps: 2 Entrance Stairs-Rails: Right [place]: One level Bathroom Shower/Tub: Multimedia programmer: Handicapped height Bathroom Accessibility: Yes Additional Comments: spouse and 3 daughters, one lives down the street is Therapist, sports works from home  Lives With: Spouse   Functional History: Prior Function Level of Independence: Independent Comments: Loves to cook and clean, drives . Has daughters who can help at home as well.   Functional Status:  Mobility: Bed Mobility Overal bed mobility: Needs  Assistance Bed Mobility: Rolling, Sidelying to Sit Rolling: Mod assist Sidelying to sit: Mod assist, HOB elevated General bed mobility comments: Able to bring LLE to EOB only needing assist for last little bit; assist with scooting bottom and elevating trunk. Heavy use of rail. Cues to push up through RUE. Transfers Overall transfer level: Needs assistance Equipment used: 2 person hand held assist Transfers: Sit to/from Stand, Stand Pivot Transfers Sit to Stand: Max assist, +2 physical assistance Stand pivot transfers: Mod assist, +2 physical assistance, +2 safety/equipment General transfer comment: Assist of 2 to power to standing- difficulty with initiation. Stood from [employer], from [facility] x1. SPT bed to [facility] with assist for weightshifting and momentum. Ambulation/Gait Ambulation/Gait assistance: Mod assist, +2 physical assistance, +2 safety/equipment Gait Distance (Feet): 3 Feet Assistive device: 2 person hand held assist Gait Pattern/deviations: Trunk flexed, Step-to pattern General Gait Details: Able to take a few steps to get to chair with assist for weight shifting and momentum to move towards right. Poor sequencing. Poor awareness of left side of body; flexed  posture. Gait velocity: decreased     ADL: ADL Overall ADL's : Needs assistance/impaired Eating/Feeding: Minimal assistance, Sitting Eating/Feeding Details (indicate cue type and reason): needs mod cues to scan the entire tray and locate all the times. pt able to recall a tray is square and locate all 4 corners Grooming: Wash/dry hands, Set up Grooming Details (indicate cue type and reason): provided hand sanitizer Toilet Transfer: +2 for physical assistance, +2 for safety/equipment, Maximal assistance General ADL Comments: pt on BSC on arrival and transfered to chair for eating breakfast. pt reports sitting int he chair will help her back   Cognition: Cognition Overall Cognitive Status: Impaired/Different from  baseline Orientation Level: Oriented X4 Cognition Arousal/Alertness: Awake/alert Behavior During Therapy: WFL for tasks assessed/performed Overall Cognitive Status: Impaired/Different from baseline Area of Impairment: Awareness, Problem solving, Safety/judgement Safety/Judgement: Decreased awareness of deficits, Decreased awareness of safety Awareness: Intellectual Problem Solving: Decreased initiation, Difficulty sequencing, Requires verbal cues, Requires tactile cues General Comments: lack of awareness to deficits. Pt report could do it if back didnt hurt. Right gaze preference. Able to get to midline and gaze left on a few occasions with max cues.     Blood pressure (!) 120/43, pulse 81, temperature 98.2 F (36.8 C), temperature source Temporal, resp. rate 13, height 5\' 1"  (1.549 m), weight 111.4 kg, SpO2 95 %. Physical Exam  Nursing note and vitals reviewed. Constitutional: She is oriented to person, place, and time. She appears well-developed and well-nourished.  Obese female. NAD  HENT:  Head: Normocephalic.  Eyes: Pupils are equal, round, and reactive to light. EOM are normal.  Neck: Normal range of motion. No tracheal deviation present. No thyromegaly present.  Cardiovascular: Normal rate and regular rhythm. Exam reveals no friction rub.  No murmur heard. Respiratory: Effort normal and breath sounds normal. No respiratory distress. She has no wheezes.  GI: Soft. She exhibits no distension. There is no abdominal tenderness.  Genitourinary:    Genitourinary Comments: Foley in place.    Neurological: She is alert and oriented to person, place, and time.  Left central 7 and tongue deviation. Speech dysarthric. Fair insight and basic awareness. LUE and LLE tr to 0/5. Decreased LT left arm and leg and inattention.    .   Skin: Skin is warm and dry. No rash noted. No erythema.  Psychiatric:  Flat, cooperative      Lab Results Last 48 Hours        Results for orders placed or  performed during the hospital encounter of 11/29/18 (from the past 48 hour(s))  CBC     Status: None    Collection Time: 12/02/18  1:13 AM  Result Value Ref Range    WBC 9.8 4.0 - 10.5 K/uL    RBC 3.96 3.87 - 5.11 MIL/uL    Hemoglobin 13.4 12.0 - 15.0 g/dL    HCT 39.6 36.0 - 46.0 %    MCV 100.0 80.0 - 100.0 fL    MCH 33.8 26.0 - 34.0 pg    MCHC 33.8 30.0 - 36.0 g/dL    RDW 13.3 11.5 - 15.5 %    Platelets 272 150 - 400 K/uL    nRBC 0.0 0.0 - 0.2 %      Comment: Performed at [facility], [place] [address] [place], [place] [address]  Basic metabolic panel     Status: Abnormal    Collection Time: 12/02/18  1:13 AM  Result Value Ref Range    Sodium 137 135 -  145 mmol/L    Potassium 3.7 3.5 - 5.1 mmol/L    Chloride 105 98 - 111 mmol/L    CO2 24 22 - 32 mmol/L    Glucose, Bld 118 (H) 70 - 99 mg/dL    BUN 9 8 - 23 mg/dL    Creatinine, Ser 0.84 0.44 - 1.00 mg/dL    Calcium 8.9 8.9 - 10.3 mg/dL    GFR calc non Af Amer >60 >60 mL/min    GFR calc Af Amer >60 >60 mL/min    Anion gap 8 5 - 15      Comment: Performed at [facility] [address], [place], [place] [address]  Protime-INR     Status: None    Collection Time: 12/02/18  1:13 AM  Result Value Ref Range    Prothrombin Time 13.9 11.4 - 15.2 seconds    INR 1.1 0.8 - 1.2      Comment: (NOTE) INR goal varies based on device and disease states. Performed at [facility], [place] [address] [place], [place] [address]        Imaging Results (Last 48 hours)  Ct Head Wo Contrast   Result Date: 12/01/2018 CLINICAL DATA:  Altered mental status, increased left-sided facial drooping, headache, aphasia EXAM: CT HEAD WITHOUT CONTRAST TECHNIQUE: Contiguous axial images were obtained from the base of the skull through the vertex without intravenous contrast. COMPARISON:  MR brain, 11/30/2018, CT brain, 11/29/2018 FINDINGS: Brain: Redemonstrated large edematous infarction of the posterior right MCA territory, edema and  hypodensity substantially increased compared to prior examination dated 11/29/2018. There is new hemorrhagic conversion anteriorly, noted in the deep white matter of the posterior frontal and temporal lobes about the insula (series 7, image 15). No significant midline shift or evidence of downward herniation. Vascular: No hyperdense vessel or unexpected calcification. Skull: Normal. Negative for fracture or focal lesion. Sinuses/Orbits: No acute finding. Other: None. IMPRESSION: 1. Redemonstrated large edematous infarction of the posterior right MCA territory, edema and hypodensity substantially increased compared to prior examination dated 11/29/2018. 2. There is new hemorrhagic conversion anteriorly, noted in the deep white matter of the posterior frontal and temporal lobes about the insula (series 7, image 15). 3.  No significant midline shift or evidence of downward herniation. Electronically Signed   By: Eddie Candle M.D.   On: 12/01/2018 11:22    Mr Brain Wo Contrast   Result Date: 11/30/2018 CLINICAL DATA:  Follow-up right MCA infarct. Right M2 occlusion status post endovascular revascularization. EXAM: MRI HEAD WITHOUT CONTRAST TECHNIQUE: Multiplanar, multiecho pulse sequences of the brain and surrounding structures were obtained without intravenous contrast. COMPARISON:  Head CT, CTA, and CT perfusion 11/29/2018 FINDINGS: The study is mildly motion degraded. Brain: There is a moderate-sized acute right MCA infarct involving the parietal lobe, posterior temporal lobe, lateral occipital lobe, and posterior insula with associated confluent petechial hemorrhage at the level of the insula and posterior operculum. There is associated cytotoxic edema without midline shift or other significant mass effect. Additional small acute infarcts are present in the anteroinferior right frontal lobe and posterior right frontal lobe. There are also punctate acute infarcts in the high posterior left frontal lobe and  possibly left parietal lobe. There is no extra-axial fluid collection. Scattered small foci of T2 hyperintensity in the cerebral white matter bilaterally are nonspecific but compatible with mild chronic small vessel ischemic disease. Vascular: Major intracranial vascular flow voids are preserved. Skull and upper cervical spine: Unremarkable bone marrow signal.  Sinuses/Orbits: Unremarkable orbits. Paranasal sinuses and mastoid air cells are clear. Other: None. IMPRESSION: 1. Moderate-sized acute right MCA infarct with confluent petechial hemorrhage. 2. One or two punctate acute left frontoparietal infarcts. 3. Mild chronic small vessel ischemic disease. Electronically Signed   By: Logan Bores M.D.   On: 11/30/2018 18:56            Medical Problem List and Plan: 1.  Left hemiparesis and functional deficits secondary to right MCA infarct             -admit to inpatient rehab.  2.  Antithrombotics: -DVT/anticoagulation:  Mechanical: Sequential compression devices, below knee Bilateral lower extremities             -antiplatelet therapy: Low dose ASA.  3. OA bilateral knees/Pain Management: tylenol prn. Resume Voltaren gel  To bilateral knees.   4. Mood:  LCSW to follow for evaluation and support.              -antipsychotic agents: N/A 5. Neuropsych: This patient is not fully capable of making decisions on on own behalf. 6. Skin/Wound Care: Routine pressure relief measures.  7. Fluids/Electrolytes/Nutrition: Monitor I/O. Check lytes in am.  8. HTN: Monitor BP tid--improved control with fewer fluctuations over last 48 hours. Would not add further bp medication at this point to avoid hypoperfusion.  9. Urinary retention: Check UCS. Will continue urecholine for voiding trial and titrate upwards as needed. Discontinue foley and start bladder training in am.  Set toileting schedule, monitor PVRs and I/O caths to keep volumes < 350cc 10. Prediabetes: Hgb A1c-5.9. CM diet.  11. Morbid obesity: Educate  patient on CM/HH diet and importance of weight loss to help promote health and mobility.  12. Dyslipidemia: On Atorvastatin.      Post Admission Physician Evaluation: 1. Functional deficits secondary  to right mca infarct. 2. Patient is admitted to receive collaborative, interdisciplinary care between the physiatrist, rehab nursing staff, and therapy team. 3. Patient's level of medical complexity and substantial therapy needs in context of that medical necessity cannot be provided at a lesser intensity of care such as a SNF. 4. Patient has experienced substantial functional loss from his/her baseline which was documented above under the "Functional History" and "Functional Status" headings.  Judging by the patient's diagnosis, physical exam, and functional history, the patient has potential for functional progress which will result in measurable gains while on inpatient rehab.  These gains will be of substantial and practical use upon discharge  in facilitating mobility and self-care at the household level. 5. Physiatrist will provide 24 hour management of medical needs as well as oversight of the therapy plan/treatment and provide guidance as appropriate regarding the interaction of the two. 6. The Preadmission Screening has been reviewed and patient status is unchanged unless otherwise stated above. 7. 24 hour rehab nursing will assist with bladder management, bowel management, safety, skin/wound care, disease management, medication administration, pain management and patient education  and help integrate therapy concepts, techniques,education, etc. 8. PT will assess and treat for/with: Lower extremity strength, range of motion, stamina, balance, functional mobility, safety, adaptive techniques and equipment, NMR, family ed.   Goals are: supervision to min assist. 9. OT will assess and treat for/with: ADL's, functional mobility, safety, upper extremity strength, adaptive techniques and equipment, NMR,  family ed.   Goals are: supervision to min assist. Therapy may proceed with showering this patient. 10. SLP will assess and treat for/with: cognition, communication.  Goals are: supervision. 11. Case  Management and Social Worker will assess and treat for psychological issues and discharge planning. 12. Team conference will be held weekly to assess progress toward goals and to determine barriers to discharge. 13. Patient will receive at least 3 hours of therapy per day at least 5 days per week. 14. ELOS: 12-18 days       15. Prognosis:  excellent   Meredith Staggers, MD, [facility] 12/02/2018       Bary Leriche, PA-C 12/02/2018

## 2018-12-02 NOTE — Progress Notes (Signed)
Pt had a very difficult time with dinner this evening. RN provided full supervision for cueing. Pt's visual deficits were making it extremely difficult to see food and find it on her plate using utensils even when placed on her right side. Pt used fork to hunt and stab each piece of food and frequently misplaced utensils or dropped her cup. Pt required max cues to eat dinner.

## 2018-12-03 ENCOUNTER — Inpatient Hospital Stay (HOSPITAL_COMMUNITY): Payer: Medicare Other | Admitting: Occupational Therapy

## 2018-12-03 ENCOUNTER — Encounter (HOSPITAL_COMMUNITY): Payer: Self-pay | Admitting: Internal Medicine

## 2018-12-03 ENCOUNTER — Inpatient Hospital Stay (HOSPITAL_COMMUNITY): Payer: Medicare Other | Admitting: Speech Pathology

## 2018-12-03 ENCOUNTER — Inpatient Hospital Stay (HOSPITAL_COMMUNITY): Payer: Medicare Other | Admitting: Physical Therapy

## 2018-12-03 ENCOUNTER — Inpatient Hospital Stay (HOSPITAL_COMMUNITY): Payer: Medicare Other

## 2018-12-03 DIAGNOSIS — I639 Cerebral infarction, unspecified: Secondary | ICD-10-CM

## 2018-12-03 DIAGNOSIS — R414 Neurologic neglect syndrome: Secondary | ICD-10-CM

## 2018-12-03 LAB — GLUCOSE, CAPILLARY
Glucose-Capillary: 119 mg/dL — ABNORMAL HIGH (ref 70–99)
Glucose-Capillary: 113 mg/dL — ABNORMAL HIGH (ref 70–99)
Glucose-Capillary: 115 mg/dL — ABNORMAL HIGH (ref 70–99)
Glucose-Capillary: 156 mg/dL — ABNORMAL HIGH (ref 70–99)

## 2018-12-03 LAB — COMPREHENSIVE METABOLIC PANEL
ALT: 21 U/L (ref 0–44)
AST: 21 U/L (ref 15–41)
Albumin: 3.5 g/dL (ref 3.5–5.0)
Alkaline Phosphatase: 64 U/L (ref 38–126)
Anion gap: 10 (ref 5–15)
BUN: 12 mg/dL (ref 8–23)
CO2: 25 mmol/L (ref 22–32)
Calcium: 9 mg/dL (ref 8.9–10.3)
Chloride: 103 mmol/L (ref 98–111)
Creatinine, Ser: 0.85 mg/dL (ref 0.44–1.00)
GFR calc Af Amer: >60 mL/min (ref >60)
GFR calc non Af Amer: >60 mL/min (ref >60)
Glucose, Bld: 124 mg/dL — ABNORMAL HIGH (ref 70–99)
Potassium: 3.9 mmol/L (ref 3.5–5.1)
Sodium: 138 mmol/L (ref 135–145)
Total Bilirubin: 1.4 mg/dL — ABNORMAL HIGH (ref 0.3–1.2)
Total Protein: 6.4 g/dL — ABNORMAL LOW (ref 6.5–8.1)

## 2018-12-03 LAB — CBC WITH DIFFERENTIAL/PLATELET
Abs Immature Granulocytes: 0.04 10*3/uL (ref 0.00–0.07)
Basophils Absolute: 0 10*3/uL (ref 0.0–0.1)
Basophils Relative: 0 %
Eosinophils Absolute: 0.1 10*3/uL (ref 0.0–0.5)
Eosinophils Relative: 1 %
HCT: 42.4 % (ref 36.0–46.0)
Hemoglobin: 14.2 g/dL (ref 12.0–15.0)
Immature Granulocytes: 0 %
Lymphocytes Relative: 20 %
Lymphs Abs: 1.9 10*3/uL (ref 0.7–4.0)
MCH: 33.3 pg (ref 26.0–34.0)
MCHC: 33.5 g/dL (ref 30.0–36.0)
MCV: 99.3 fL (ref 80.0–100.0)
Monocytes Absolute: 0.8 10*3/uL (ref 0.1–1.0)
Monocytes Relative: 9 %
Neutro Abs: 6.4 10*3/uL (ref 1.7–7.7)
Neutrophils Relative %: 70 %
Platelets: 264 10*3/uL (ref 150–400)
RBC: 4.27 MIL/uL (ref 3.87–5.11)
RDW: 13.1 % (ref 11.5–15.5)
WBC: 9.2 10*3/uL (ref 4.0–10.5)
nRBC: 0 % (ref 0.0–0.2)

## 2018-12-03 NOTE — Progress Notes (Signed)
[place] PHYSICAL MEDICINE & REHABILITATION PROGRESS NOTE   Subjective/Complaints:  No issues overnite   ROS- no pain c/o no breathing  Issues or coughing, has occ L knee pain  Objective:   Ct Head Wo Contrast  Result Date: 12/01/2018 CLINICAL DATA:  Altered mental status, increased left-sided facial drooping, headache, aphasia EXAM: CT HEAD WITHOUT CONTRAST TECHNIQUE: Contiguous axial images were obtained from the base of the skull through the vertex without intravenous contrast. COMPARISON:  MR brain, 11/30/2018, CT brain, 11/29/2018 FINDINGS: Brain: Redemonstrated large edematous infarction of the posterior right MCA territory, edema and hypodensity substantially increased compared to prior examination dated 11/29/2018. There is new hemorrhagic conversion anteriorly, noted in the deep white matter of the posterior frontal and temporal lobes about the insula (series 7, image 15). No significant midline shift or evidence of downward herniation. Vascular: No hyperdense vessel or unexpected calcification. Skull: Normal. Negative for fracture or focal lesion. Sinuses/Orbits: No acute finding. Other: None. IMPRESSION: 1. Redemonstrated large edematous infarction of the posterior right MCA territory, edema and hypodensity substantially increased compared to prior examination dated 11/29/2018. 2. There is new hemorrhagic conversion anteriorly, noted in the deep white matter of the posterior frontal and temporal lobes about the insula (series 7, image 15). 3.  No significant midline shift or evidence of downward herniation. Electronically Signed   By: Eddie Candle M.D.   On: 12/01/2018 11:22   Recent Labs    12/02/18 0113 12/03/18 0514  WBC 9.8 9.2  HGB 13.4 14.2  HCT 39.6 42.4  PLT 272 264   Recent Labs    12/02/18 0113 12/03/18 0514  NA 137 138  K 3.7 3.9  CL 105 103  CO2 24 25  GLUCOSE 118* 124*  BUN 9 12  CREATININE 0.84 0.85  CALCIUM 8.9 9.0    Intake/Output Summary (Last 24  hours) at 12/03/2018 0626 Last data filed at 12/02/2018 1855 Gross per 24 hour  Intake 220 ml  Output 350 ml  Net -130 ml     Physical Exam: Vital Signs Blood pressure (!) 132/56, pulse 74, temperature 98.4 F (36.9 C), resp. rate 20, height 5\' 1"  (1.549 m), weight 107.2 kg, SpO2 96 %.   General: No acute distress Mood and affect are appropriate Heart: Regular rate and rhythm no rubs murmurs or extra sounds Lungs: Clear to auscultation, breathing unlabored, no rales or wheezes Abdomen: Positive bowel sounds, soft nontender to palpation, nondistended Extremities: No clubbing, cyanosis, or edema Skin: No evidence of breakdown, no evidence of rash Neurologic: Cranial nerves II through XII intact, motor strength is 5/5 in Right  deltoid, bicep, tricep, grip, hip flexor, knee extensors, ankle dorsiflexor and plantar flexor, 0/5 on th eleft in these muscle groups  Sensory exam normal sensation to light touch and proprioception in bilateral upper and lower extremities Cerebellar exam normal finger to nose to finger as well as heel to shin in bilateral upper and lower extremities Musculoskeletal: Full range of motion in all 4 extremities. No joint swelling   Assessment/Plan: 1. 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  Physiatrist is providing close team supervision and 24 hour management of active medical problems listed below.  Physiatrist and rehab team continue to assess barriers to discharge/monitor patient progress toward functional and medical goals  Care Tool:  Bathing  Bathing assist       Upper Body Dressing/Undressing Upper body dressing        Upper body assist      Lower Body Dressing/Undressing Lower body dressing            Lower body assist       Toileting Toileting     Toileting assist       Transfers Chair/bed transfer  Transfers assist     Chair/bed transfer assist level: Maximal Assistance - Patient 25 - 49%     Locomotion Ambulation   Ambulation assist              Walk 10 feet activity   Assist           Walk 50 feet activity   Assist           Walk 150 feet activity   Assist           Walk 10 feet on uneven surface  activity   Assist           Wheelchair     Assist               Wheelchair 50 feet with 2 turns activity    Assist            Wheelchair 150 feet activity     Assist          Medical Problem List and Plan: 1.Left hemiparesis, dysphagia  and functional deficitssecondary to large right MCA infarct -CIR PT, OT, SLP evals  2. Antithrombotics: -DVT/anticoagulation:Mechanical:Sequential compression devices, below kneeBilateral lower extremities -antiplatelet therapy: Low dose ASA. 3.OA bilateral knees/Pain Management:tylenol prn. Resume Voltaren gel To bilateral knees.  4. Mood:LCSW to follow for evaluation and support. -antipsychotic agents: N/A 5. Neuropsych: This patientis not fullycapable of making decisions on onown behalf. 6. Skin/Wound Care:Routine pressure relief measures. 7. Fluids/Electrolytes/Nutrition:Monitor I/O. Check lytes in am. 8.HTN: Monitor BP tid--improved control with fewer fluctuations over last 48 hours. Would not add further bp medication at this point to avoid hypoperfusion. Vitals:   12/02/18 2009 12/03/18 0620  BP: 133/73 (!) 132/56  Pulse: 93 74  Resp: 20 20  Temp: 98.8 F (37.1 C) 98.4 F (36.9 C)  SpO2: 97% 96%  controlled 7/22 9. Urinary retention: Check UCS.Will continue urecholinefor voiding trialand titrate upwards as needed.Discontinue foley and start bladder training in am.Set toileting schedule, monitor PVRs and I/O caths to keep volumes  <350cc 10. Prediabetes: Hgb A1c-5.9. CM diet.  11. Morbid obesity: Educate patient on CM/HH diet and importance of weight loss to help promote health and mobility.  Poor intake due to attn may lead to some gradual wt loss 12. Dyslipidemia: On Atorvastatin    LOS: 1 days A FACE TO FACE EVALUATION WAS PERFORMED  Charlett Blake 12/03/2018, 6:26 AM

## 2018-12-03 NOTE — Progress Notes (Signed)
Inpatient Rehabilitation Admissions Coordinator  I received calls from two daughters last night of requests from family of patient's needs. I met with patient at bedside this morning, spoke with oldest daughter, and met with pt's spouse in [place] parking as I received clothing/essentials he brought for her. I reassured family that we would pursue their requests to assist with Deanna [name] care. I have discussed concerns with Anderson Malta, Nursing Director, charge RN, Vikki Ports as well as patient's RN, Santiago Glad. I have reassured family to place call to patient's Nurse as needed for communication.  Danne Baxter, RN, MSN Rehab Admissions Coordinator [phone] 12/03/2018 10:28 AM

## 2018-12-03 NOTE — Evaluation (Signed)
Occupational Therapy Assessment and Plan  Patient Details  Name: Deanna [name] MRN: [id number] Date of Birth: [birth year]  OT Diagnosis: abnormal posture, cognitive deficits, disturbance of vision, hemiplegia affecting non-dominant side and muscle weakness (generalized) Rehab Potential: Rehab Potential (ACUTE ONLY): Good ELOS: 21-23 days   Today's Date: 12/03/2018 OT Individual Time: 1115-1200 OT Individual Time Calculation (min): 45 min     Problem List:  Patient Active Problem List   Diagnosis Date Noted  . Acute left hemiparesis ([place]) 12/02/2018  . Hyperlipemia 12/02/2018  . Prediabetes 12/02/2018  . Morbid obesity ([place]) 12/02/2018  . Chronic back pain 12/02/2018  . Urinary retention 12/02/2018  . Acute ischemic right MCA stroke ([place]) 12/02/2018  . Acute ischemic stroke ([place]) large R MCA and 2 L infarcts s/p tPA and mechanical thrombectomy 11/29/2018  . 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 S/P right knee arthroscopy 07/09/2017  . S/P knee surgery 01/10/2017    Past Medical History:  Past Medical History:  Diagnosis Date  . Arthritis    osteoarthritis in  both knees  . Asthma   . Difficult intravenous access   . GERD (gastroesophageal reflux disease)   . Heart murmur    born with years ago  . Pre-diabetes    last A1C=6.0 per pt  . Torn meniscus    left knee w fracture x2 to left knee  . Wears glasses    Past Surgical History:  Past Surgical History:  Procedure Laterality Date  . BUBBLE STUDY  12/02/2018   Procedure: BUBBLE STUDY;  Surgeon: Elouise Munroe, MD;  Location: [place];  Service: Cardiology;;  . CHOLECYSTECTOMY    . CHONDROPLASTY Right 07/09/2017   Procedure: CHONDROPLASTY;  Surgeon: Sydnee Cabal, MD;  Location: [facility];  Service: Orthopedics;  Laterality: Right;  . HERNIA REPAIR Right age 68   inguinal  . IR CT HEAD LTD  11/29/2018  . IR PERCUTANEOUS ART THROMBECTOMY/INFUSION INTRACRANIAL INC DIAG ANGIO   11/29/2018  . KNEE ARTHROSCOPY WITH MEDIAL MENISECTOMY Left 01/10/2017   Procedure: LEFT KNEE ARTHROSCOPY, DEBRIDEMENT, PARTIAL MEDIAL MENISCECTOMY, CHONDROPLASTY, ARTHROSCOPIC ASSISTED INTERNAL FIXATION OF MEDIAL FEMORAL CONDYLE AND MEDIAL TIBIAL PLATEAU INSUFFICIENCY FRACTURES;  Surgeon: Sydnee Cabal, MD;  Location: [place];  Service: Orthopedics;  Laterality: Left;  . KNEE ARTHROSCOPY WITH MEDIAL MENISECTOMY Right 07/09/2017   Procedure: Right knee scope, debridement, chondroplasty, partial meniscectomy, internal arthroscopic assisted internal fixation of medial tibial plateau fracture;  Surgeon: Sydnee Cabal, MD;  Location[facility];  Service: Orthopedics;  Laterality: Right;  . LOOP RECORDER INSERTION N/A 12/02/2018   Procedure: LOOP RECORDER INSERTION;  Surgeon: Evans Lance, MD;  Location: [place] [facility];  Service: Cardiovascular;  Laterality: N/A;  . RADIOLOGY WITH ANESTHESIA N/A 11/29/2018   Procedure: RADIOLOGY WITH ANESTHESIA;  Surgeon: Luanne Bras, MD;  Location: [place];  Service: Radiology;  Laterality: N/A;  . TEE WITHOUT CARDIOVERSION N/A 12/02/2018   Procedure: TRANSESOPHAGEAL ECHOCARDIOGRAM (TEE);  Surgeon: Elouise Munroe, MD;  Location: [place];  Service: Cardiology;  Laterality: N/A;  . TUBAL LIGATION  age 53    Assessment & Plan Clinical Impression: Patient is a 68 y.o. year old female with recent admission to the hospital on 11/29/18 with left sided tingling with LLE weakness and right gaze preference.CT head showed acute R-MCA infarct with hyperdense sign and was treated with tPA. CTA Head/neck with perfusion showed proximal right M2 occlusion with associated acute R-MCA infarct with penumbra and moderate cervical carotid  artery atherosclerosis. She underwent cerebral angio with endovascular revascularization of R-MCA by Dr. Estanislado Pandy. Follow up MRI brain showed moderate acute R0MCA infarct with petechial hemorrhage  and one or two acute left frontoparietal infarcts.Dr.Sethi felt that stroke was embolic due to unknown source and recommendedASA for secondary prevention andTEE for work up.On 07/20,she had increase in left sided weakness with nausea and follow up CT Head showed large edematous infarction in R-MCA with substantial increase in hypodensity and new Hemorrhagic conversion in deep white matter of posterior frontal and temporal lobes.  .  Patient transferred to CIR on 12/02/2018 .    Patient currently requires total with basic self-care skills secondary to muscle weakness, unbalanced muscle activation and decreased coordination, decreased visual perceptual skills, field cut and hemianopsia, decreased attention to left and left side neglect, decreased awareness, decreased safety awareness, decreased memory and delayed processing and decreased sitting balance, decreased standing balance, decreased postural control, hemiplegia and decreased balance strategies.  Prior to hospitalization, patient could complete ADLs with independent .  Patient will benefit from skilled intervention to decrease level of assist with basic self-care skills and increase independence with basic self-care skills prior to discharge home with care partner.  Anticipate patient will require minimal physical assistance and follow up home health.  OT - End of Session Activity Tolerance: Improving Endurance Deficit: Yes OT Assessment Rehab Potential (ACUTE ONLY): Good OT Patient demonstrates impairments in the following area(s): Balance;Cognition;Perception;Safety;Sensory;Endurance;Motor;Vision OT Basic ADL's Functional Problem(s): Eating;Grooming;Bathing;Dressing;Toileting OT Advanced ADL's Functional Problem(s): Simple Meal Preparation OT Transfers Functional Problem(s): Toilet;Tub/Shower OT Additional Impairment(s): Fuctional Use of Upper Extremity OT Plan OT Intensity: Minimum of 1-2 x/day, 45 to 90 minutes OT Frequency: 5  out of 7 days OT Duration/Estimated Length of Stay: 21-23 days OT Treatment/Interventions: Balance/vestibular training;Discharge planning;Functional electrical stimulation;Pain management;Self Care/advanced ADL retraining;Therapeutic Activities;UE/LE Coordination activities;Cognitive remediation/compensation;Disease mangement/prevention;Functional mobility training;Patient/family education;Therapeutic Exercise;Community reintegration;DME/adaptive equipment instruction;Neuromuscular re-education;Psychosocial support;Splinting/orthotics;UE/LE Strength taining/ROM;Visual/perceptual remediation/compensation;Wheelchair propulsion/positioning OT Self Feeding Anticipated Outcome(s): supervision OT Basic Self-Care Anticipated Outcome(s): min to mod assist OT Toileting Anticipated Outcome(s): min assist level OT Bathroom Transfers Anticipated Outcome(s): min assist level OT Recommendation Recommendations for Other Services: Neuropsych consult Patient destination: Home Follow Up Recommendations: 24 hour supervision/assistance;Home health OT Equipment Recommended: 3 in 1 bedside comode;Tub/shower bench   Skilled Therapeutic Intervention Began working on selfcare retraining sit to stand at the sink.  Pt exhibits severe left neglect as well as sensory deficits in the LUE and LLE.  Total assist for transfer stand pivot to the wheelchair to the left side with max instructional cueing to locate wheelchair visually prior to transfer.  Once in the chair, she was positioned at the sink for bathing task.  Mod assist for UB bathing with mod instructional cueing to sequence.  She was able to complete UB dressing at max assist secondary to not demonstrating awareness for orienting clothing and sequencing dressing.  Total assist for LB bathing to wash peri area and buttocks with total assist +2 (pt 30%) for donning all LB clothing and pulling them over her hips.  She was able to demonstrate some active movement of the LUE  for bathing tasks at Brunnstrum stage IV-V, but demonstrates minimal proprioception.  When attempting to place the washcloth in the left hand she would immediately drop it and demonstrated no awareness of this.  Max hand over hand for functional use of the LUE to wash the right arm.  Pt demonstrates significant visual perceptual deficits as well.  When looking at the clock up close, she  was unable to state the time correctly.  Pt with decreased awareness of deficit as she stated, "Well I haven't had to look at a clock like that in a while, the ones I look at are mostly digital".  Therapist re-directed her that reading a clock is something pretty basic that she would have been able to do prior to the stroke.  She eventually agreed.  Finished session with pt up in the wheelchair and call button and phone in reach.  Safety alarm belt in place with call button and phone in reach.  Discussed expectations with pt that she will likely need min to some possible mod assist for ADLs at discharge as well as 24 hour assistance.  She reports having many family members as well as her spouse that can assist her.     OT Evaluation Precautions/Restrictions  Precautions Precautions: Fall Precaution Comments: left neglect Restrictions Weight Bearing Restrictions: No  Pain Pain Assessment Pain Scale: Faces Pain Score: 0-No pain Home Living/Prior Functioning Home Living Available Help at Discharge: Available PRN/intermittently Type of Home: House Home Access: Stairs to enter Entrance Stairs-Number of Steps: 3 Entrance Stairs-Rails: Right Home Layout: One level Bathroom Shower/Tub: Multimedia programmer: Handicapped height Bathroom Accessibility: Yes  Lives With: Spouse IADL History Homemaking Responsibilities: Yes Meal Prep Responsibility: Therapist, occupational Responsibility: Primary Cleaning Responsibility: Primary Current License: Yes Education: highschool Prior Function Level of Independence:  Independent with gait, Independent with transfers, Requires assistive device for independence(RW occasionally)  Able to Take Stairs?: Yes Driving: Yes Vocation: Retired ADL ADL Eating: Maximal assistance Where Assessed-Eating: Bed level Grooming: Minimal assistance Where Assessed-Grooming: Sitting at sink Upper Body Bathing: Moderate assistance Where Assessed-Upper Body Bathing: Wheelchair Lower Body Bathing: Dependent Where Assessed-Lower Body Bathing: Sitting at sink, Standing at sink Upper Body Dressing: Maximal assistance Where Assessed-Upper Body Dressing: Wheelchair Lower Body Dressing: Dependent Where Assessed-Lower Body Dressing: Wheelchair Toileting: Dependent Where Assessed-Toileting: Bedside Commode Toilet Transfer: Dependent Toilet Transfer Method: Engineer, water: Drop arm bedside commode Vision Baseline Vision/History: Wears glasses Wears Glasses: At all times Patient Visual Report: Diplopia Vision Assessment?: Vision impaired- to be further tested in functional context Ocular Range of Motion: Impaired-to be further tested in functional context Alignment/Gaze Preference: Gaze right Visual Fields: Left visual field deficit Perception  Perception: Impaired Inattention/Neglect: Does not attend to left side of body;Does not attend to left visual field Praxis Praxis: Impaired Praxis Impairment Details: Motor planning Cognition Overall Cognitive Status: Impaired/Different from baseline Arousal/Alertness: Awake/alert Orientation Level: Person;Place;Situation Person: Oriented Place: Oriented Situation: Oriented Year: 2020 Month: July Day of Week: Incorrect(stated Tuesday) Memory: Impaired Memory Impairment: Storage deficit Immediate Memory Recall: Sock;Blue;Bed Memory Recall Sock: Without Cue Memory Recall Blue: Without Cue Memory Recall Bed: Without Cue Attention: Sustained Sustained Attention: Impaired Sustained Attention  Impairment: Verbal basic;Functional basic Awareness: Impaired Awareness Impairment: Intellectual impairment;Emergent impairment;Anticipatory impairment Problem Solving: Impaired Problem Solving Impairment: Functional basic;Functional complex Executive Function: (all severely impaired by lower level cognitive deficits) Behaviors: Impulsive;Lability;Restless Safety/Judgment: Impaired Comments: Pt with decreased awareness of physical and memory deficits from CVA. Sensation Sensation Light Touch: Impaired Detail Light Touch Impaired Details: Impaired LUE;Impaired LLE Hot/Cold: Impaired Detail Hot/Cold Impaired Details: Impaired LUE Proprioception: Impaired Detail Proprioception Impaired Details: Impaired LUE;Impaired LLE Stereognosis: Not tested Coordination Gross Motor Movements are Fluid and Coordinated: No Fine Motor Movements are Fluid and Coordinated: No Coordination and Movement Description: Pt needs max hand over hand assist with washing the right arm. She demonstrates Brunnstrum stage IV in the arm and  the hand. Motor  Motor Motor: Hemiplegia Motor - Skilled Clinical Observations: LUE and LLE hemiparesis Mobility  Bed Mobility Bed Mobility: Supine to Sit Supine to Sit: Maximal Assistance - Patient - Patient 25-49% Transfers Sit to Stand: Maximal Assistance - Patient 25-49%  Trunk/Postural Assessment  Cervical Assessment Cervical Assessment: Exceptions to WFL(forward head) Thoracic Assessment Thoracic Assessment: Exceptions to WFL(rounded posture) Lumbar Assessment Lumbar Assessment: Exceptions to WFL(posterior pelvic tilt) Postural Control Postural Control: Deficits on evaluation Trunk Control: increased lean to the left noted during bathing  Balance Balance Balance Assessed: Yes Static Sitting Balance Static Sitting - Balance Support: Feet supported Static Sitting - Level of Assistance: 4: Min assist Dynamic Sitting Balance Dynamic Sitting - Balance Support: Feet  supported Dynamic Sitting - Level of Assistance: 3: Mod assist Static Standing Balance Static Standing - Balance Support: During functional activity Static Standing - Level of Assistance: 1: +1 Total assist Dynamic Standing Balance Dynamic Standing - Level of Assistance: 1: +2 Total assist Extremity/Trunk Assessment   LUE Assessment LUE Assessment: Exceptions to [facility] Passive Range of Motion (PROM) Comments: WFLS General Strength Comments: Pt with gross digit flexion and extension present with sustained visual attention to the UE.  She demonstrates Brunnstrum stage IV to V movement in arm and hand bur demonstrates limited functional use secondary to sensory deficits.    Refer to Care Plan for Long Term Goals  Recommendations for other services: Neuropsych and Therapeutic Recreation  Stress management   Discharge Criteria: Patient will be discharged from OT if patient refuses treatment 3 consecutive times without medical reason, if treatment goals not met, if there is a change in medical status, if patient makes no progress towards goals or if patient is discharged from hospital.  The above assessment, treatment plan, treatment alternatives and goals were discussed and mutually agreed upon: by patient  Eura Mccauslin OTR/L 12/03/2018, 4:17 PM

## 2018-12-03 NOTE — Progress Notes (Signed)
Lower extremity venous has been completed.   Preliminary results in CV Proc.   Results given to RN.   Abram Sander 12/03/2018 5:16 PM

## 2018-12-03 NOTE — Progress Notes (Signed)
  PT family called at 2000 7/21, with concerns about pt not having proper care considering the specific needs. This nurse spoke with the daughter and husband 2 separate occasions, and was able to do all the requested task  They wanted for the pt. Pt requested to be in the chair due to her back pain, even after being educated on the importance of therapy evaluating the pt prior to transferring with nursing staff. Pt was a 3 assist with RW to stand pivot to chair. Pt was able to stand with mod assist but unable to move the Lside and unable to hold the walker. Pt also has significant visual impairments, and pt became  very anxious/agitated when she was unable to find things

## 2018-12-03 NOTE — Evaluation (Signed)
Physical Therapy Assessment and Plan  Patient Details  Name: Deanna [name] MRN: [id number] Date of Birth: [birth year]  PT Diagnosis: Cognitive deficits, Difficulty walking, Hemiplegia non-dominant, Impaired cognition, Impaired sensation and Muscle weakness Rehab Potential: Good ELOS: 21-25 days   Today's Date: 12/03/2018 PT Individual Time: 1300-1415 PT Individual Time Calculation (min): 75 min    Problem List:  Patient Active Problem List   Diagnosis Date Noted  . Acute left hemiparesis ([place]) 12/02/2018  . Hyperlipemia 12/02/2018  . Prediabetes 12/02/2018  . Morbid obesity ([place]) 12/02/2018  . Chronic back pain 12/02/2018  . Urinary retention 12/02/2018  . Acute ischemic right MCA stroke ([place]) 12/02/2018  . Acute ischemic stroke ([place]) large R MCA and 2 L infarcts s/p tPA and mechanical thrombectomy 11/29/2018  . 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 S/P right knee arthroscopy 07/09/2017  . S/P knee surgery 01/10/2017    Past Medical History:  Past Medical History:  Diagnosis Date  . Arthritis    osteoarthritis in  both knees  . Asthma   . Difficult intravenous access   . GERD (gastroesophageal reflux disease)   . Heart murmur    born with years ago  . Pre-diabetes    last A1C=6.0 per pt  . Torn meniscus    left knee w fracture x2 to left knee  . Wears glasses    Past Surgical History:  Past Surgical History:  Procedure Laterality Date  . BUBBLE STUDY  12/02/2018   Procedure: BUBBLE STUDY;  Surgeon: Elouise Munroe, MD;  Location: [place];  Service: Cardiology;;  . CHOLECYSTECTOMY    . CHONDROPLASTY Right 07/09/2017   Procedure: CHONDROPLASTY;  Surgeon: Sydnee Cabal, MD;  Location: [facility];  Service: Orthopedics;  Laterality: Right;  . HERNIA REPAIR Right age 32   inguinal  . IR CT HEAD LTD  11/29/2018  . IR PERCUTANEOUS ART THROMBECTOMY/INFUSION INTRACRANIAL INC DIAG ANGIO  11/29/2018  . KNEE ARTHROSCOPY WITH MEDIAL  MENISECTOMY Left 01/10/2017   Procedure: LEFT KNEE ARTHROSCOPY, DEBRIDEMENT, PARTIAL MEDIAL MENISCECTOMY, CHONDROPLASTY, ARTHROSCOPIC ASSISTED INTERNAL FIXATION OF MEDIAL FEMORAL CONDYLE AND MEDIAL TIBIAL PLATEAU INSUFFICIENCY FRACTURES;  Surgeon: Sydnee Cabal, MD;  Location: [place];  Service: Orthopedics;  Laterality: Left;  . KNEE ARTHROSCOPY WITH MEDIAL MENISECTOMY Right 07/09/2017   Procedure: Right knee scope, debridement, chondroplasty, partial meniscectomy, internal arthroscopic assisted internal fixation of medial tibial plateau fracture;  Surgeon: Sydnee Cabal, MD;  Location: [facility];  Service: Orthopedics;  Laterality: Right;  . LOOP RECORDER INSERTION N/A 12/02/2018   Procedure: LOOP RECORDER INSERTION;  Surgeon: Evans Lance, MD;  Location: [place] [facility];  Service: Cardiovascular;  Laterality: N/A;  . RADIOLOGY WITH ANESTHESIA N/A 11/29/2018   Procedure: RADIOLOGY WITH ANESTHESIA;  Surgeon: Luanne Bras, MD;  Location: [place];  Service: Radiology;  Laterality: N/A;  . TEE WITHOUT CARDIOVERSION N/A 12/02/2018   Procedure: TRANSESOPHAGEAL ECHOCARDIOGRAM (TEE);  Surgeon: Elouise Munroe, MD;  Location: [place];  Service: Cardiology;  Laterality: N/A;  . TUBAL LIGATION  age 63    Assessment & Plan Clinical Impression:  [name] is a 68 year old female with history of OA, asthma,morbid obesity,prediabetes who was admitted on 11/29/18 with left sided tingling with LLE weakness and right gaze preference.CT head showed acute R-MCA infarct with hyperdense sign and was treated with tPA. CTA Head/neck with perfusion showed proximal right M2 occlusion with associated acute R-MCA infarct with penumbra and moderate cervical carotid artery atherosclerosis. She  underwent cerebral angio with endovascular revascularization of R-MCA by Dr. Estanislado Pandy. Follow up MRI brain showed moderate acute R0MCA infarct with petechial hemorrhage and  one or two acute left frontoparietal infarcts.Dr.Sethi felt that stroke was embolic due to unknown source and recommendedASA for secondary prevention andTEE for work up.On 07/20,she had increase in left sided weakness with nausea and follow up CT Head showed large edematous infarction in R-MCA with substantial increase in hypodensity and new Hemorrhagic conversion in deep white matter of posterior frontal and temporal lobes. She was treated with fluid bolus and recommendations to keep SBP <160.   She underwent TEEwith loop recorder placementtoday. TEE revealedaortic atherosclerosis in aortic arch and was negative for PFO or LAA thrombus.She has had urinary retention requiring I/O caths as well as issues with back pain and mild leucocytosis.She was started on low dose urecholine and foley placed 12/01/18. Patient with resultant left sided weakness with sensory deficits, left inattention, left hemiopsia with right gaze preference, difficulty with initiation. Problems with sequencing and poor awareness of deficits.Patient transferred to CIR on 12/02/2018 .   Patient currently requires max to total A with mobility secondary to muscle weakness, abnormal tone, unbalanced muscle activation and decreased coordination, decreased visual perceptual skills, decreased midline orientation and decreased attention to left, decreased initiation, decreased attention, decreased awareness, decreased problem solving, decreased safety awareness and decreased memory and decreased sitting balance, decreased standing balance, decreased postural control, hemiplegia and decreased balance strategies.  Prior to hospitalization, patient was independent  with mobility and lived with Spouse in a House home.  Home access is 3Stairs to enter.  Patient will benefit from skilled PT intervention to maximize safe functional mobility, minimize fall risk and decrease caregiver burden for planned discharge home with 24 hour  assist.  Anticipate patient will benefit from follow up [facility] at discharge.  PT - End of Session Activity Tolerance: Tolerates 30+ min activity with multiple rests Endurance Deficit: Yes PT Assessment Rehab Potential (ACUTE/IP ONLY): Good PT Barriers to Discharge: Medical stability;Home environment access/layout PT Patient demonstrates impairments in the following area(s): Balance;Endurance;Motor;Perception;Safety;Sensory PT Transfers Functional Problem(s): Bed Mobility;Bed to Chair;Car;Furniture;Floor PT Locomotion Functional Problem(s): Ambulation;Wheelchair Mobility;Stairs PT Plan PT Intensity: Minimum of 1-2 x/day ,45 to 90 minutes PT Frequency: 5 out of 7 days PT Duration Estimated Length of Stay: 21-25 days PT Treatment/Interventions: Ambulation/gait training;Balance/vestibular training;Cognitive remediation/compensation;Community reintegration;Discharge planning;Disease management/prevention;DME/adaptive equipment instruction;Functional electrical stimulation;Functional mobility training;Neuromuscular re-education;Pain management;Patient/family education;Psychosocial support;Splinting/orthotics;Stair training;Therapeutic Activities;Therapeutic Exercise;UE/LE Strength taining/ROM;UE/LE Coordination activities;Visual/perceptual remediation/compensation;Wheelchair propulsion/positioning PT Transfers Anticipated Outcome(s): min A PT Locomotion Anticipated Outcome(s): min A at w/c level PT Recommendation Recommendations for Other Services: Therapeutic Recreation consult Therapeutic Recreation Interventions: Stress management Follow Up Recommendations: Home health PT Patient destination: Home Equipment Recommended: Wheelchair (measurements);Wheelchair cushion (measurements);To be determined Equipment Details: further equipment TBD pending progress  Skilled Therapeutic Intervention Evaluation completed (see details above and below) with education on PT POC and goals and individual treatment  initiated with focus on functional transfer assessment, orientation to rehab unit and POC, assessment of w/c mobility, and setting pt up with a manual w/c. Pt received seated in manual w/c in room, agreeable to PT session. No complaints of pain. Sit to stand with max A to stedy. Stedy transfer from 20x18 wheelchair to lower 20x18 wheelchair for improved fit for patient due to short stature. Pt still unable to reach floor with LE with w/c cushion in place, will continue to assess w/c needs. Sit to stand in // bars with mod A. Pt hyperextends L knee in  stance for standing balance, unable to maintain balance and initiate steps. Sit to stand x 3 reps with mod A. Manual w/c propulsion x 10 ft with use of hemi technique with R UE/LE and max A for steering, removed w/c cushion so pt could reach floor with RLE. Pt unable to follow cues and due to visual deficits unable to steer in a straight line without max A. Provided pt with L lap tray for improved support and positioning while seated in w/c. Pt left seated in w/c in room with needs in reach, quick release belt and chair alarm in place. Handed off pt to RT for Zoom session with family.  PT Evaluation Precautions/Restrictions Precautions Precautions: Fall Precaution Comments: left neglect Restrictions Weight Bearing Restrictions: No Pain Pain Assessment Pain Scale: Faces Pain Score: 0-No pain Home Living/Prior Functioning Home Living Available Help at Discharge: Available PRN/intermittently Type of Home: House Home Access: Stairs to enter Entrance Stairs-Number of Steps: 3 Entrance Stairs-Rails: Right Home Layout: One level Bathroom Shower/Tub: Multimedia programmer: Handicapped height Bathroom Accessibility: Yes  Lives With: Spouse Prior Function Level of Independence: Independent with gait;Independent with transfers;Requires assistive device for independence(RW occasionally)  Able to Take Stairs?: Yes Driving: Yes Vocation:  Retired Vision/Perception  Vision - History Baseline Vision: Wears glasses all the time Vision - Assessment Ocular Range of Motion: Impaired-to be further tested in functional context Alignment/Gaze Preference: Gaze right Perception Perception: Impaired Inattention/Neglect: Does not attend to left side of body;Does not attend to left visual field Praxis Praxis: Impaired Praxis Impairment Details: Motor planning  Cognition Overall Cognitive Status: Impaired/Different from baseline Arousal/Alertness: Awake/alert Orientation Level: Oriented to person;Oriented to place Attention: Sustained Sustained Attention: Impaired Sustained Attention Impairment: Verbal basic;Functional basic Memory: Impaired Memory Impairment: Storage deficit Awareness: Impaired Awareness Impairment: Intellectual impairment;Emergent impairment;Anticipatory impairment Problem Solving: Impaired Problem Solving Impairment: Functional basic;Functional complex Behaviors: Impulsive;Lability;Restless Safety/Judgment: Impaired Comments: Pt with decreased awareness of physical and memory deficits from CVA. Sensation Sensation Light Touch: Impaired Detail Light Touch Impaired Details: Impaired LUE;Impaired LLE Hot/Cold: Impaired Detail Hot/Cold Impaired Details: Impaired LUE Proprioception: Impaired Detail Proprioception Impaired Details: Impaired LUE;Impaired LLE Stereognosis: Not tested Coordination Gross Motor Movements are Fluid and Coordinated: No Fine Motor Movements are Fluid and Coordinated: No Coordination and Movement Description: impaired 2/2 hemiplegia and L inattention and proprioceptive deficits Motor  Motor Motor: Hemiplegia Motor - Skilled Clinical Observations: LUE and LLE hemiparesis  Mobility Bed Mobility Bed Mobility: Supine to Sit Supine to Sit: Maximal Assistance - Patient - Patient 25-49% Transfers Transfers: Sit to [employer] via Geophysicist/field seismologist Sit to Stand: Maximal Assistance -  Patient 25-49% Stand Pivot Transfers: 2 Press photographer (Assistive device): None Transfer via Lift Equipment: Haematologist / Additional Locomotion Stairs: No Architect: Yes Wheelchair Assistance: Maximal Assistance - Patient 25 - 49% Wheelchair Propulsion: Right upper extremity;Right lower extremity Wheelchair Parts Management: Needs assistance Distance: 10  Trunk/Postural Assessment  Cervical Assessment Cervical Assessment: Exceptions to WFL(forward head) Thoracic Assessment Thoracic Assessment: Exceptions to WFL(rounded shoulders) Lumbar Assessment Lumbar Assessment: Exceptions to WFL(posterior pelvic tilt) Postural Control Postural Control: Deficits on evaluation Trunk Control: impaired, L lateral lean  Balance Balance Balance Assessed: Yes Static Sitting Balance Static Sitting - Balance Support: No upper extremity supported;Feet supported Static Sitting - Level of Assistance: 4: Min assist Dynamic Sitting Balance Dynamic Sitting - Balance Support: Feet supported;During functional activity Dynamic Sitting - Level of Assistance: 3: Mod assist Static Standing Balance Static Standing - Balance Support: Bilateral upper extremity  supported;During functional activity Static Standing - Level of Assistance: 2: Max assist Dynamic Standing Balance Dynamic Standing - Level of Assistance: 1: +2 Total assist Extremity Assessment   LUE Assessment LUE Assessment: Exceptions to [facility] Passive Range of Motion (PROM) Comments: WFLS General Strength Comments: Pt with gross digit flexion and extension present with sustained visual attention to the UE.  She demonstrates Brunnstrum stage IV to V movement in arm and hand bur demonstrates limited functional use secondary to sensory deficits. RLE Assessment RLE Assessment: Within Functional Limits General Strength Comments: 4/5 grossly LLE Assessment LLE Assessment: Exceptions to [facility] General Strength  Comments: 2+/5 hip flex, 3/5 knee flex/ext, 1/5 ankle DF(pt unable to fully follow v/c to test)    Refer to Care Plan for Long Term Goals  Recommendations for other services: Therapeutic Recreation  Stress management  Discharge Criteria: Patient will be discharged from PT if patient refuses treatment 3 consecutive times without medical reason, if treatment goals not met, if there is a change in medical status, if patient makes no progress towards goals or if patient is discharged from hospital.  The above assessment, treatment plan, treatment alternatives and goals were discussed and mutually agreed upon: by patient   Excell Seltzer, PT, DPT 12/03/2018, 4:38 PM

## 2018-12-03 NOTE — Evaluation (Signed)
Speech Language Pathology Assessment and Plan  Patient Details  Name: Deanna [name] MRN: [id number] Date of Birth: Sep 20, 1950  SLP Diagnosis: Cognitive Impairments  Rehab Potential: Fair ELOS:   3 to 3.5 weeks   Today's Date: 12/03/2018 SLP Individual Time: 0800-0900 SLP Individual Time Calculation (min): 60 min   Problem List:  Patient Active Problem List   Diagnosis Date Noted  . Acute left hemiparesis ([place]) 12/02/2018  . Hyperlipemia 12/02/2018  . Prediabetes 12/02/2018  . Morbid obesity ([place]) 12/02/2018  . Chronic back pain 12/02/2018  . Urinary retention 12/02/2018  . Acute ischemic right MCA stroke ([place]) 12/02/2018  . Acute ischemic stroke ([place]) large R MCA and 2 L infarcts s/p tPA and mechanical thrombectomy 11/29/2018  . 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 S/P right knee arthroscopy 07/09/2017  . S/P knee surgery 01/10/2017   Past Medical History:  Past Medical History:  Diagnosis Date  . Arthritis    osteoarthritis in  both knees  . Asthma   . Difficult intravenous access   . GERD (gastroesophageal reflux disease)   . Heart murmur    born with years ago  . Pre-diabetes    last A1C=6.0 per pt  . Torn meniscus    left knee w fracture x2 to left knee  . Wears glasses    Past Surgical History:  Past Surgical History:  Procedure Laterality Date  . BUBBLE STUDY  12/02/2018   Procedure: BUBBLE STUDY;  Surgeon: Elouise Munroe, MD;  Location: [place];  Service: Cardiology;;  . CHOLECYSTECTOMY    . CHONDROPLASTY Right 07/09/2017   Procedure: CHONDROPLASTY;  Surgeon: Sydnee Cabal, MD;  Location: [facility];  Service: Orthopedics;  Laterality: Right;  . HERNIA REPAIR Right age 35   inguinal  . IR CT HEAD LTD  11/29/2018  . IR PERCUTANEOUS ART THROMBECTOMY/INFUSION INTRACRANIAL INC DIAG ANGIO  11/29/2018  . KNEE ARTHROSCOPY WITH MEDIAL MENISECTOMY Left 01/10/2017   Procedure: LEFT KNEE ARTHROSCOPY, DEBRIDEMENT, PARTIAL MEDIAL  MENISCECTOMY, CHONDROPLASTY, ARTHROSCOPIC ASSISTED INTERNAL FIXATION OF MEDIAL FEMORAL CONDYLE AND MEDIAL TIBIAL PLATEAU INSUFFICIENCY FRACTURES;  Surgeon: Sydnee Cabal, MD;  Location: [place];  Service: Orthopedics;  Laterality: Left;  . KNEE ARTHROSCOPY WITH MEDIAL MENISECTOMY Right 07/09/2017   Procedure: Right knee scope, debridement, chondroplasty, partial meniscectomy, internal arthroscopic assisted internal fixation of medial tibial plateau fracture;  Surgeon: Sydnee Cabal, MD;  Location: [facility];  Service: Orthopedics;  Laterality: Right;  . LOOP RECORDER INSERTION N/A 12/02/2018   Procedure: LOOP RECORDER INSERTION;  Surgeon: Evans Lance, MD;  Location: [place] [facility];  Service: Cardiovascular;  Laterality: N/A;  . RADIOLOGY WITH ANESTHESIA N/A 11/29/2018   Procedure: RADIOLOGY WITH ANESTHESIA;  Surgeon: Luanne Bras, MD;  Location: [place];  Service: Radiology;  Laterality: N/A;  . TEE WITHOUT CARDIOVERSION N/A 12/02/2018   Procedure: TRANSESOPHAGEAL ECHOCARDIOGRAM (TEE);  Surgeon: Elouise Munroe, MD;  Location: [place];  Service: Cardiology;  Laterality: N/A;  . TUBAL LIGATION  age 68    Assessment / Plan / Recommendation Clinical Impression Katelan Hirt is a 68 year old female with history of OA, asthma, morbid obesity, prediabetes who was admitted on 11/29/18 with left sided tingling with LLE weakness and right gaze preference.  CT head showed acute R-MCA infarct with hyperdense sign and was treated with tPA.  CTA  Head/neck with perfusion showed proximal right M2 occlusion with associated acute R-MCA infarct with penumbra and moderate cervical carotid artery atherosclerosis. She  underwent cerebral angio with endovascular revascularization of R-MCA by Dr. Estanislado Pandy. Follow up MRI brain showed moderate acute R0MCA infarct with petechial hemorrhage and one or two acute left frontoparietal infarcts. Dr.Sethi felt that stroke was  embolic due to unknown source and recommended  ASA for secondary prevention and TEE for work up.  On 07/20,  she had increased in left sided weakness with nausea and follow up CT  Head showed large edematous infarction in R-MCA with substantial increase in hypodensity and new  hemorrhagic conversion in deep white matter of posterior frontal and temporal lobes.    She underwent TEE with loop recorder placement today. TEE revealed aortic atherosclerosis in aortic arch and was negative for PFO or LAA thrombus.  She has had urinary retention requiring I/O caths as well as issues with back pain and mild leucocytosis. She was started on low dose urecholine and foley placed  12/01/18. Patient with resultant left sided weakness with sensory deficits, left inattention, left hemiopsia with right gaze preference, difficulty with initiation, sequencing and poor awareness of deficits.   Bedside swallow evaluation and cognitive linguistic evaluations completed on 12/03/18. Although pt presents with mild to moderate left facial weakness, pt is able to consume regular diet with trace left oral residue but manages well. Additionally, pt consumed thin liquids via straw with no overt s/s of aspiration. Would continue to recommend full supervision during meals to help with visual deficits. Cognitively, pt presents with severe deficits in sustained attention, task initiation, verbal inhibitation (severe verbosity), emotional lability, right gaze preference (left inattention) and overall intellectual awareness. Pt's verbosity interfers with retention of information, ability to perform tasks such as self-feeding. 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 Skilled ST is required to target the above mentioned deficits, increase functional independence and decrease caregiver burden. Anticipate that pt will likely require Mod A for cognitive function d/t severity of deficits.     Skilled Therapeutic Interventions           Skilled treatment session focused on completion of above mentioned assessments, see above. Additionally, SLP facilitated session by providing Total A for sustained attention to self-feeding d/t deficits in task initiation and sustained attention d/t verbosity. When placed on right, pt able to locate fork, stab piece of french toast and self-feed. Despite Total A, pt unable to shift gaze to midline. Pt with decreased awareness of cognitive deficits.    SLP Assessment  Patient will need skilled Speech Lanaguage Pathology Services during CIR admission    Recommendations  SLP Diet Recommendations: Age appropriate regular solids;Thin Liquid Administration via: Cup;Straw Medication Administration: Whole meds with liquid Supervision: Staff to assist with self feeding;Full supervision/cueing for compensatory strategies Compensations: Minimize environmental distractions;Slow rate;Small sips/bites Oral Care Recommendations: Oral care BID Recommendations for Other Services: Neuropsych consult Patient destination: (TBD) Follow up Recommendations: Skilled Nursing facility;24 hour supervision/assistance Equipment Recommended: None recommended by SLP    SLP Frequency 3 to 5 out of 7 days   SLP Duration  SLP Intensity  SLP Treatment/Interventions  3 weeks  Minumum of 1-2 x/day, 30 to 90 minutes  Cognitive remediation/compensation;Therapeutic Activities;Patient/family education;Functional tasks    Pain    Prior Functioning Cognitive/Linguistic Baseline: Within functional limits Type of Home: House  Lives With: Spouse Available Help at Discharge: Available PRN/intermittently Education: highschool Vocation: Retired  Industrial/product designer Term Goals: Week 1: SLP Short Term Goal 1 (Week 1): Pt will sustain attention to basic task for ~ 1 minute with Mod A cues. SLP Short Term  Goal 2 (Week 1): Pt will perform basic familiar problem solving tasks related to ADLs with Mod A cues. SLP Short Term Goal 3 (Week 1):  Pt will initiate basic familiar problem solving tasks in 6 out of 10 opportunities with Max A cues. SLP Short Term Goal 4 (Week 1): Pt will scan to midline in 5 out of 10 opportunities with Max A cues.  Refer to Care Plan for Long Term Goals  Recommendations for other services: Neuropsych  Discharge Criteria: Patient will be discharged from SLP if patient refuses treatment 3 consecutive times without medical reason, if treatment goals not met, if there is a change in medical status, if patient makes no progress towards goals or if patient is discharged from hospital.  The above assessment, treatment plan, treatment alternatives and goals were discussed and mutually agreed upon: by patient  Heylee Tant 12/03/2018, 10:57 AM

## 2018-12-03 NOTE — Progress Notes (Signed)
Inpatient Rehabilitation  Patient information reviewed and entered into eRehab system by Bonnie Roig M. Celest Reitz, M.A., CCC/SLP, PPS Coordinator.  Information including medical coding, functional ability and quality indicators will be reviewed and updated through discharge.    

## 2018-12-03 NOTE — Progress Notes (Signed)
Occupational Therapy Session Note  Patient Details  Name: Deanna [name] MRN: [id number] Date of Birth: 1950-06-24  Today's Date: 12/03/2018 OT Individual Time: 1115-1200 OT Individual Time Calculation (min): 45 min     Skilled Therapeutic Interventions/Progress Updates:  Balance/vestibular training;Discharge planning;Functional electrical stimulation;Pain management;Self Care/advanced ADL retraining;Therapeutic Activities;UE/LE Coordination activities;Cognitive remediation/compensation;Disease mangement/prevention;Functional mobility training;Patient/family education;Therapeutic Exercise;Community reintegration;DME/adaptive equipment instruction;Neuromuscular re-education;Psychosocial support;Splinting/orthotics;UE/LE Strength taining/ROM;Visual/perceptual remediation/compensation;Wheelchair propulsion/positioning   1:1 Focus on continuing to assess vision and visual attention to the left. Max difficulty with reading a wall clock and moderate difficulty with drawing a clock and indicating a specific time. Trial for Glasses taped for left homonymous hemianopsia. Pt with movement in left UE proximal and distal but with limited sensation.  Trial of sit to stand with STEDY with focus on sustained grasp with left hand and forward weight shift. Pt able to stand with mod A. Continued to focus on terminal standing from perched seat 4x. Returned to seated position in w/c and left sitting up in prep for lunch.  Therapy Documentation Precautions:  Precautions Precautions: Fall Precaution Comments: left neglect Restrictions Weight Bearing Restrictions: No General:   Vital Signs: Therapy Vitals Temp: 97.6 F (36.4 C) Pulse Rate: 80 Resp: 18 BP: (!) 103/47 Patient Position (if appropriate): Sitting Oxygen Therapy SpO2: 96 % O2 Device: Room Air Pain: Pain Assessment Pain Scale: Faces Pain Score: 0-No pain ADL: ADL Eating: Maximal assistance Where Assessed-Eating: Bed level Grooming: Minimal  assistance Where Assessed-Grooming: Sitting at sink Upper Body Bathing: Moderate assistance Where Assessed-Upper Body Bathing: Wheelchair Lower Body Bathing: Dependent Where Assessed-Lower Body Bathing: Sitting at sink, Standing at sink Upper Body Dressing: Maximal assistance Where Assessed-Upper Body Dressing: Wheelchair Lower Body Dressing: Dependent Where Assessed-Lower Body Dressing: Wheelchair Toileting: Dependent Where Assessed-Toileting: Bedside Commode Toilet Transfer: Dependent Toilet Transfer Method: Engineer, water: Drop arm bedside commode Vision Baseline Vision/History: Wears glasses Wears Glasses: At all times Patient Visual Report: Diplopia Vision Assessment?: Vision impaired- to be further tested in functional context Ocular Range of Motion: Impaired-to be further tested in functional context Alignment/Gaze Preference: Gaze right Perception  Perception: Impaired Inattention/Neglect: Does not attend to left side of body;Does not attend to left visual field Praxis Praxis: Impaired Praxis Impairment Details: Motor planning Exercises:   Other Treatments:     Therapy/Group: Individual Therapy  Willeen Cass [facility] 12/03/2018, 4:05 PM

## 2018-12-04 ENCOUNTER — Inpatient Hospital Stay (HOSPITAL_COMMUNITY): Payer: Medicare Other | Admitting: Occupational Therapy

## 2018-12-04 ENCOUNTER — Inpatient Hospital Stay (HOSPITAL_COMMUNITY): Payer: Medicare Other | Admitting: Physical Therapy

## 2018-12-04 ENCOUNTER — Inpatient Hospital Stay (HOSPITAL_COMMUNITY): Payer: Medicare Other

## 2018-12-04 ENCOUNTER — Encounter (HOSPITAL_COMMUNITY): Payer: Medicare Other

## 2018-12-04 ENCOUNTER — Inpatient Hospital Stay (HOSPITAL_COMMUNITY): Payer: Medicare Other | Admitting: Speech Pathology

## 2018-12-04 LAB — GLUCOSE, CAPILLARY
Glucose-Capillary: 114 mg/dL — ABNORMAL HIGH (ref 70–99)
Glucose-Capillary: 104 mg/dL — ABNORMAL HIGH (ref 70–99)
Glucose-Capillary: 112 mg/dL — ABNORMAL HIGH (ref 70–99)
Glucose-Capillary: 115 mg/dL — ABNORMAL HIGH (ref 70–99)

## 2018-12-04 LAB — HIV ANTIBODY (ROUTINE TESTING W REFLEX): HIV Screen 4th Generation wRfx: UNDETERMINED

## 2018-12-04 MED ORDER — BETHANECHOL CHLORIDE 10 MG PO TABS
10.0000 mg | ORAL_TABLET | Freq: Four times a day (QID) | ORAL | Status: DC
  Administered 2018-12-04 – 2018-12-24 (×80): 10 mg via ORAL
  Filled 2018-12-04 (×83): qty 1

## 2018-12-04 MED ORDER — MIRTAZAPINE 15 MG PO TBDP
15.0000 mg | ORAL_TABLET | Freq: Every day | ORAL | Status: DC
  Administered 2018-12-04 – 2018-12-25 (×22): 15 mg via ORAL
  Filled 2018-12-04 (×23): qty 1

## 2018-12-04 NOTE — Progress Notes (Signed)
Physical Therapy Session Note  Patient Details  Name: Deanna [name] MRN: [id number] Date of Birth: 1950/10/18  Today's Date: 12/04/2018 PT Individual Time: 1130-1200 PT Individual Time Calculation (min): 30 min   Short Term Goals: Week 1:  PT Short Term Goal 1 (Week 1): Pt will complete least restrictive transfer with max A consistently PT Short Term Goal 2 (Week 1): Pt will initiate gait training PT Short Term Goal 3 (Week 1): Pt will perform bed mobility with mod A consistently  Skilled Therapeutic Interventions/Progress Updates:    Handoff from OT. Pt reports the feeling of her bladder being full and returned to room via w/c total assist for time management. Utilized Stedy for sit <> stands and transfers from w/c <> toilet and to perform clothing management. NMR addressed during transitional movements, sitting balance and postural control while seated on toilet, and in standing position with Stedy due to strong pushing to the L and severe L neglect. Pt requires total assist +2 for transfers and mod to max assist in standing/seated position for postural control and realignment but by end of session requiring total assist due to fatigue and decreased attention and awareness. Pt requires max cues for attention and awareness of LUE during functional mobility. Pt unable to successfully void - RN made aware.   Therapy Documentation Precautions:  Precautions Precautions: Fall Precaution Comments: left neglect Restrictions Weight Bearing Restrictions: No  Pain: Denies pain.    Therapy/Group: Individual Therapy  Deanna [name], PT, DPT, CBIS  12/04/2018, 12:22 PM

## 2018-12-04 NOTE — Progress Notes (Signed)
Occupational Therapy Session Note  Patient Details  Name: Deanna [name] MRN: [id number] Date of Birth: May 08, 1951  Today's Date: 12/04/2018 OT Individual Time: 1443-1540 OT Individual Time Calculation (min): 43 min    Short Term Goals: Week 1:  OT Short Term Goal 1 (Week 1): Pt will maintain unsupported sitting EOC or bed with supervision during UB bathing. OT Short Term Goal 2 (Week 1): Pt will complete UB dressing with mod assist to donn pullover only for 3 consecutive sessions. OT Short Term Goal 3 (Week 1): Pt will complete LB dressing with max assist sit to stand following hemi techniques for donning brief and pants. OT Short Term Goal 4 (Week 1): Pt will complete toilet transfer to the drop arm commode with mod assist for squat pivot. OT Short Term Goal 5 (Week 1): Pt will locate all grooming and bathing items left of midline with no more than mod instructional cueing.  Skilled Therapeutic Interventions/Progress Updates:    Pt greeted seated in wc and agreeable to OT treatment session. Pt stated she had already bathed and dressed this morning. Pt brought to therapy gym total A w/ wc. OT adjusted L armest height with 1/2 lap tray to better support L UE while sitting in wc. Brought pt to high low table and worked on towel pushes for neuro re-ed. Pt with difficulty following commands to turn on L side requiring OT assist to facilitate shoulder movement initially. With repetition, pt able to turn on shoulder and assist with exercise. Worked on trying to isolate elbow flex/ext with OT providing joint input to bring pt through full elbow ROM-slight activation noted. Educated on self-ROM with pt demonstrating understanding, incorporated core and balance exercise with having pt reach with hands clasped towards OT on R side to elicit trunk elongation on affected L side. Pt handoff to PT for next therapy session.  Therapy Documentation Precautions:  Precautions Precautions: Fall Precaution Comments:  left neglect Restrictions Weight Bearing Restrictions: No Pain: Denies pain  Therapy/Group: Individual Therapy  Valma Cava 12/04/2018, 12:13 PM

## 2018-12-04 NOTE — Progress Notes (Signed)
Physical Therapy Session Note  Patient Details  Name: Deanna [name] MRN: [id number] Date of Birth: Mar 15, 1951  Today's Date: 12/04/2018 PT Individual Time: 1275-1700 PT Individual Time Calculation (min): 63 min   Short Term Goals: Week 1:  PT Short Term Goal 1 (Week 1): Pt will complete least restrictive transfer with max A consistently PT Short Term Goal 2 (Week 1): Pt will initiate gait training PT Short Term Goal 3 (Week 1): Pt will perform bed mobility with mod A consistently  Skilled Therapeutic Interventions/Progress Updates:    Pt received sitting in w/c talking on the phone with family with admissions coordinator present - pt agreeable to therapy session. Transported to/from gym in w/c. Performed x3 sit<>stands in parallel bars with R UE support and max assist of 1 for lifting/lowering and blocking L LE knee buckling - cuing for hip/knee extension and L lateral weight shifting. Performed static standing in // bars focusing on L LE stance phase NMR for hip/knee extension and increased weight bearing with max assist of 1 for balance with manual facilitation for hip/knee extension and multimodal cuing on improved posture and L stance posture  - 2nd person assisting with L UE support on // bars to increase weight bearing. Squat pivot w/c<>EOM with max assist of 2 for trunk control, pivoting hips, and L LE knee control. Performed sitting balance and L attention task on EOM via reaching to grasp items with R hand - intermittent R UE support on mat and varying max/total assist for dynamic balance to prevent L lateral LOB while reaching for items to supervision-min assist for static sitting balance. Therapist attempted to locate wider and shorter w/c for improved pt fit but none available at this time. Pt reports need to urinate with bladder discomfort. Squat pivot back to w/c and transported back to room in w/c. Sit<>stands in stedy with +2 mod/max assist for lifting/lowering - pt demonstrates decreased  hip extension when standing in stedy compared to in // bars. Standing in stedy total assist LB clothing management with +2 mod assist for standing balance. Pt attempted to urinate but no success - NT notified. Transported back to room in stedy with +2 assist for trunk control/safety. Sit>supine in bed with +2 max assist for trunk descent and LE management. Pt left supine in bed with NT present for bladder scan and to assume care of patient.   Therapy Documentation Precautions:  Precautions Precautions: Fall Precaution Comments: left neglect Restrictions Weight Bearing Restrictions: No  Pain: Reports bladder discomfort - performed timed toileting with no success.   Therapy/Group: Individual Therapy  Tawana Scale, PT, DPT 12/04/2018, 1:05 PM

## 2018-12-04 NOTE — Progress Notes (Signed)
Occupational Therapy Session Note  Patient Details  Name: Deanna [name] MRN: [id number] Date of Birth: [birth year]  Today's Date: 12/04/2018 OT Individual Time:   804-  959   55 min   Short Term Goals: Week 1:  OT Short Term Goal 1 (Week 1): Pt will maintain unsupported sitting EOC or bed with supervision during UB bathing. OT Short Term Goal 2 (Week 1): Pt will complete UB dressing with mod assist to donn pullover only for 3 consecutive sessions. OT Short Term Goal 3 (Week 1): Pt will complete LB dressing with max assist sit to stand following hemi techniques for donning brief and pants. OT Short Term Goal 4 (Week 1): Pt will complete toilet transfer to the drop arm commode with mod assist for squat pivot. OT Short Term Goal 5 (Week 1): Pt will locate all grooming and bathing items left of midline with no more than mod instructional cueing.  Skilled Therapeutic Interventions/Progress Updates:    1:1. Pt with no report of pain, however reports rough night with nightmares. Pt agreeable to shower. occlusives applied to loop recorder and IV. MOD A +2 stedy transfer to [facility] in shower. Pt with minimal incontinent loose BM on BSC in shower. Overall MAX A for bathing in shower with multimodal cues for midline orientation and L attention when applying soap to wash cloth. Pt dresses sit to stand in stedy with total A +2 for LB dressing (MOD A +2 sit to stand). MOD A for UB dressing with VC for hemi dressing techniques. Pt requires VC for head turns at sink for locating oral care and deodorant items on L of faucet. Exited session with pt seated in w/c, half lap tray in place, exit alarm on and call light in reach  Therapy Documentation Precautions:  Precautions Precautions: Fall Precaution Comments: left neglect Restrictions Weight Bearing Restrictions: No General:   Vital Signs:   Pain: Pain Assessment Pain Scale: (P) 0-10 Pain Score: (P) 7  ADL: ADL Eating: Maximal assistance Where  Assessed-Eating: Bed level Grooming: Minimal assistance Where Assessed-Grooming: Sitting at sink Upper Body Bathing: Moderate assistance Where Assessed-Upper Body Bathing: Wheelchair Lower Body Bathing: Dependent Where Assessed-Lower Body Bathing: Sitting at sink, Standing at sink Upper Body Dressing: Maximal assistance Where Assessed-Upper Body Dressing: Wheelchair Lower Body Dressing: Dependent Where Assessed-Lower Body Dressing: Wheelchair Toileting: Dependent Where Assessed-Toileting: Bedside Commode Toilet Transfer: Dependent Toilet Transfer Method: Engineer, water: Drop arm bedside commode Vision   Perception    Praxis   Exercises:   Other Treatments:     Therapy/Group: Individual Therapy  Tonny Branch 12/04/2018, 8:54 AM

## 2018-12-04 NOTE — Progress Notes (Signed)
Speech Language Pathology Daily Session Note  Patient Details  Name: Deanna [name] MRN: [id number] Date of Birth: Jun 13, 1950  Today's Date: 12/04/2018 SLP Individual Time: 1300-1345 SLP Individual Time Calculation (min): 45 min  Short Term Goals: Week 1: SLP Short Term Goal 1 (Week 1): Pt will sustain attention to basic task for ~ 1 minute with Mod A cues. SLP Short Term Goal 2 (Week 1): Pt will perform basic familiar problem solving tasks related to ADLs with Mod A cues. SLP Short Term Goal 3 (Week 1): Pt will initiate basic familiar problem solving tasks in 6 out of 10 opportunities with Max A cues. SLP Short Term Goal 4 (Week 1): Pt will scan to midline in 5 out of 10 opportunities with Max A cues.  Skilled Therapeutic Interventions:  Skilled treatment session focused on cognition goals. SLP facilitated session by providing Mod A cues to scan to midline and Max A cues to scan to left of midline to locate items. SLP also facilitated session by providing Mod A for sustained attention to basic task for ~ 3 minute intervals. Pt with noted decreased verbosity. Pt with self-created strategies to place items on the right of bedside table for better organization. Additionally, pt consumed lunch during latter part of session and while she is able to effectively masticate regular, she benefited from reminders to clear oral cavity before attempting to talk. Pt demonstrated good overall progress during this session. Pt left upright in wheelchair, lap belt alarm on, soft touch call bell positioned within reach and all requested items present. Continue per current plan of care.      Pain    Therapy/Group: Individual Therapy  Yonis Carreon 12/04/2018, 2:50 PM

## 2018-12-04 NOTE — Progress Notes (Signed)
Updated Husband on doppler results and plans for monitoring. He reported that patient has been expressing anxiety and need for something to help her handle things. Will start patient of remeron for mood stabilization and insomna.  Advised him not to purchase any equipment at this time --we will order/recomend what is needed at discharge.

## 2018-12-04 NOTE — Progress Notes (Signed)
Inpatient Rehabilitation Admissions Coordinator  Lenna Sciara, eldest daughter, contacted me last night to discuss better means of communication with Rehab team as well as family. Spouse appreciated the update provided by Jeannene Patella, PA today. Spouse to be the primary point of contact for the Rehab team for medical updates as well as overall plan of care. I discussed with SW, Sonia Baller, is to be the main point of contact for pt's spouse for overall plan of care with updates to be given by Medical team as needed also. We are hopeful that making contact designations will facilitate better means of communication. I have updated Melissa of plan and will provide pt's spouse contact numbers for SW, Sonia Baller, when I meet with him at 2 pm to provide essentials to drop off at [facility] at 2 pm today.  Danne Baxter, RN, MSN Rehab Admissions Coordinator [phone] 12/04/2018 11:39 AM

## 2018-12-04 NOTE — Progress Notes (Addendum)
[place] PHYSICAL MEDICINE & REHABILITATION PROGRESS NOTE   Subjective/Complaints:  No issues overnite  ROS- no pain c/o no breathing  Issues or coughing, has occ L knee pain  Objective:   Vas [place] Lower Extremity Venous (dvt)  Result Date: 12/03/2018  Lower Venous Study Indications: Stroke, and hermiparesis.  Comparison Study: no prior Performing Technologist: Abram Sander RVS  Examination Guidelines: A complete evaluation includes B-mode imaging, spectral Doppler, color Doppler, and power Doppler as needed of all accessible portions of each vessel. Bilateral testing is considered an integral part of a complete examination. Limited examinations for reoccurring indications may be performed as noted.  +---------+---------------+---------+-----------+----------+--------------+ RIGHT    CompressibilityPhasicitySpontaneityPropertiesSummary        +---------+---------------+---------+-----------+----------+--------------+ CFV      Full           Yes      Yes                                 +---------+---------------+---------+-----------+----------+--------------+ SFJ      Full                                                        +---------+---------------+---------+-----------+----------+--------------+ FV Prox  Full                                                        +---------+---------------+---------+-----------+----------+--------------+ FV Mid   Full                                                        +---------+---------------+---------+-----------+----------+--------------+ FV DistalFull                                                        +---------+---------------+---------+-----------+----------+--------------+ PFV      Full                                                        +---------+---------------+---------+-----------+----------+--------------+ POP      Full           Yes      Yes                                  +---------+---------------+---------+-----------+----------+--------------+ PTV      Full                                                        +---------+---------------+---------+-----------+----------+--------------+  PERO                                                  Not visualized +---------+---------------+---------+-----------+----------+--------------+   +---------+---------------+---------+-----------+----------+--------------+ LEFT     CompressibilityPhasicitySpontaneityPropertiesSummary        +---------+---------------+---------+-----------+----------+--------------+ CFV      Full           Yes      Yes                                 +---------+---------------+---------+-----------+----------+--------------+ SFJ      Full                                                        +---------+---------------+---------+-----------+----------+--------------+ FV Prox  Full                                                        +---------+---------------+---------+-----------+----------+--------------+ FV Mid   Full                                                        +---------+---------------+---------+-----------+----------+--------------+ FV DistalFull                                                        +---------+---------------+---------+-----------+----------+--------------+ PFV      Full                                                        +---------+---------------+---------+-----------+----------+--------------+ POP      Full           Yes      Yes                                 +---------+---------------+---------+-----------+----------+--------------+ PTV      None                                         Acute          +---------+---------------+---------+-----------+----------+--------------+ PERO                                                  Not visualized  +---------+---------------+---------+-----------+----------+--------------+  Summary: Right: There is no evidence of deep vein thrombosis in the lower extremity. No cystic structure found in the popliteal fossa. Left: Findings consistent with age indeterminate deep vein thrombosis involving the left posterior tibial veins. No cystic structure found in the popliteal fossa.  *See table(s) above for measurements and observations. Electronically signed by Curt Jews MD on 12/03/2018 at 6:07:40 PM.    Final    Recent Labs    12/02/18 0113 12/03/18 0514  WBC 9.8 9.2  HGB 13.4 14.2  HCT 39.6 42.4  PLT 272 264   Recent Labs    12/02/18 0113 12/03/18 0514  NA 137 138  K 3.7 3.9  CL 105 103  CO2 24 25  GLUCOSE 118* 124*  BUN 9 12  CREATININE 0.84 0.85  CALCIUM 8.9 9.0    Intake/Output Summary (Last 24 hours) at 12/04/2018 1535 Last data filed at 12/04/2018 0900 Gross per 24 hour  Intake 460 ml  Output 1350 ml  Net -890 ml     Physical Exam: Vital Signs Blood pressure 109/60, pulse 78, temperature 97.7 F (36.5 C), resp. rate 18, height 5\' 1"  (1.549 m), weight 107.2 kg, SpO2 100 %.   General: No acute distress Mood and affect are appropriate Heart: Regular rate and rhythm no rubs murmurs or extra sounds Lungs: Clear to auscultation, breathing unlabored, no rales or wheezes Abdomen: Positive bowel sounds, soft nontender to palpation, nondistended Extremities: No clubbing, cyanosis, or edema Skin: No evidence of breakdown, no evidence of rash Neurologic: Cranial nerves II through XII intact, motor strength is 5/5 in Right  deltoid, bicep, tricep, grip, hip flexor, knee extensors, ankle dorsiflexor and plantar flexor, 0/5 on th eleft in these muscle groups  Sensory exam normal sensation to light touch and proprioception in right and absent in left upper and lower extremities Cerebellar exam normal finger to nose to finger as well as heel to shin in right upper and lower extremities,  unable to perform on the left side due to weakness Musculoskeletal: Full range of motion in all 4 extremities. No joint swelling Left neglect noted however was able to reach over with the right hand and touch her left hand and correctly identified.   Assessment/Plan: 1. 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  Physiatrist is providing close team supervision and 24 hour management of active medical problems listed below.  Physiatrist and rehab team continue to assess barriers to discharge/monitor patient progress toward functional and medical goals  Care Tool:  Bathing    Body parts bathed by patient: Chest, Abdomen, Left arm, Right upper leg, Left upper leg, Face   Body parts bathed by helper: Right lower leg, Left lower leg, Front perineal area, Buttocks, Right arm     Bathing assist Assist Level: 2 Helpers     Upper Body Dressing/Undressing Upper body dressing   What is the patient wearing?: Pull over shirt    Upper body assist Assist Level: Moderate Assistance - Patient 50 - 74%    Lower Body Dressing/Undressing Lower body dressing      What is the patient wearing?: Incontinence brief, Pants     Lower body assist Assist for lower body dressing: 2 Helpers     Toileting Toileting    Toileting assist Assist for toileting: 2 Helpers     Transfers Chair/bed transfer  Transfers assist  Chair/bed transfer assist level: 2 Helpers     Locomotion Ambulation   Ambulation assist   Ambulation activity did not occur: Safety/medical concerns          Walk 10 feet activity   Assist  Walk 10 feet activity did not occur: Safety/medical concerns        Walk 50 feet activity   Assist Walk 50 feet with 2 turns activity did not occur: Safety/medical concerns         Walk 150 feet  activity   Assist Walk 150 feet activity did not occur: Safety/medical concerns         Walk 10 feet on uneven surface  activity   Assist Walk 10 feet on uneven surfaces activity did not occur: Safety/medical concerns         Wheelchair     Assist Will patient use wheelchair at discharge?: Yes Type of Wheelchair: Manual    Wheelchair assist level: Maximal Assistance - Patient 25 - 49% Max wheelchair distance: 10'    Wheelchair 50 feet with 2 turns activity    Assist    Wheelchair 50 feet with 2 turns activity did not occur: Safety/medical concerns       Wheelchair 150 feet activity     Assist Wheelchair 150 feet activity did not occur: Safety/medical concerns        Medical Problem List and Plan: 1.Left hemiparesis, dysphagia  and functional deficitssecondary to large right MCA infarct -CIR PT, OT, SLP, continue program, build endurance 2. Antithrombotics: -DVT/anticoagulation:Mechanical:Sequential compression devices, below kneeBilateral lower extremities Left post tibial DVT age indeterminate, given ICH and indeterminate age will not anticoagulate, asymptomatic, recheck doppler for propagation next week  -antiplatelet therapy: Low dose ASA. 3.OA bilateral knees/Pain Management:tylenol prn. Resume Voltaren gel To bilateral knees.  4. Mood:LCSW to follow for evaluation and support. Patient "nightmare" may be more of a perceptual issue, having some intermittent recognition of the left upper extremity but sometimes has difficulty in recognizing whether this is her own limb or not -antipsychotic agents: N/A 5. Neuropsych: This patientis not fullycapable of making decisions on onown behalf. 6. Skin/Wound Care:Routine pressure relief measures. 7. Fluids/Electrolytes/Nutrition:Monitor I/O. Check lytes in am. 8.HTN: Monitor BP tid--improved control with fewer fluctuations over last 48 hours. Would  not add further bp medication at this point to avoid hypoperfusion. Vitals:   12/04/18 0332 12/04/18 1532  BP: 116/72 109/60  Pulse: 78 78  Resp: 16 18  Temp: 98.3 F (36.8 C) 97.7 F (36.5 C)  SpO2: 97% 100%  controlled 7/24 9. Urinary retention: Check UCS.Will continue urecholinefor voiding trialand titrate upwards as needed.Discontinue foley and start bladder training in am.Set toileting schedule, monitor PVRs and I/O caths to keep volumes <350cc 10. Prediabetes: Hgb A1c-5.9. CM diet.  11. Morbid obesity: Educate patient on CM/HH diet and importance of weight loss to help promote health and mobility.  Poor intake due to attn may lead to some gradual wt loss 12. Dyslipidemia: On Atorvastatin    LOS: 2 days A FACE TO FACE EVALUATION WAS PERFORMED  Charlett Blake 12/04/2018, 3:35 PM

## 2018-12-05 ENCOUNTER — Inpatient Hospital Stay (HOSPITAL_COMMUNITY): Payer: Medicare Other | Admitting: Physical Therapy

## 2018-12-05 ENCOUNTER — Encounter (HOSPITAL_COMMUNITY): Payer: Medicare Other

## 2018-12-05 ENCOUNTER — Inpatient Hospital Stay (HOSPITAL_COMMUNITY): Payer: Medicare Other | Admitting: Speech Pathology

## 2018-12-05 ENCOUNTER — Inpatient Hospital Stay (HOSPITAL_COMMUNITY): Payer: Medicare Other | Admitting: Occupational Therapy

## 2018-12-05 LAB — GLUCOSE, CAPILLARY
Glucose-Capillary: 117 mg/dL — ABNORMAL HIGH (ref 70–99)
Glucose-Capillary: 135 mg/dL — ABNORMAL HIGH (ref 70–99)
Glucose-Capillary: 115 mg/dL — ABNORMAL HIGH (ref 70–99)
Glucose-Capillary: 118 mg/dL — ABNORMAL HIGH (ref 70–99)

## 2018-12-05 NOTE — Progress Notes (Signed)
Physical Therapy Session Note  Patient Details  Name: Deanna [name] MRN: [id number] Date of Birth: 11-Sep-1950  Today's Date: 12/05/2018 PT Individual Time: 1419-1536 PT Individual Time Calculation (min): 77 min   Short Term Goals: Week 1:  PT Short Term Goal 1 (Week 1): Pt will complete least restrictive transfer with max A consistently PT Short Term Goal 2 (Week 1): Pt will initiate gait training PT Short Term Goal 3 (Week 1): Pt will perform bed mobility with mod A consistently  Skilled Therapeutic Interventions/Progress Updates:    Pt received supine in bed and with encouragement agreeable to therapy session. Pt reports throat soreness and requesting something cold to eat - therapist provided italian ice cup. Performed L LE NMR via reciprocal hip/knee flexion/extension movements with pt demonstrating ability to initiate hip flexion and performed extension against minimal resistance - max multimodal cuing for increased muscle activation. Attempted hook lying hip adduction isometric contraction but pt demonstrated overpowering with R LE despite max multimodal cuing pt with poor L LE and midline awareness to perform properly. Performed bridging with manual facilitation for increased L LE weight bearing and hip extension with pt demonstrating minimal L glute activation with inability to clear hips from mat. Supine>sit with max multimodal cuing and manual facilitation for sequencing and heavy max assist for R hemibody management and trunk upright. Pt demonstrates strong L lateral trunk lean in sitting - performed sitting balance tasks with varying CGA to max assist to prevent L LOB while visually scanning to locate items on R and reach for them with R UE - pt aware of L lean with cuing but requires assist to return to midline. L Squat pivot EOB>w/c with heavy +2 max assist for lifting/pivoting hips and trunk control. Transported to/from gym in w/c. Sit<>stands w/c<>parallel bars with mod assist of 1 for L  LE knee guarding and trunk control - 2nd person present for safety and to facilitate L UE weightbearing. Static standing in parallel bars with mod/max assist of 1 for L LE hip/knee extension and balance/trunk control with pt demonstrating heavy L lean/weight shift. Progressed to performing standing in // bars R lateral weight shifts with max multimodal cuing and mod/max assist of 1-2 people for blocking L LE knee buckling, trunk control/balance, and to prevent anterior trunk lean and hip flexion - max multimodal cuing throughout to maintain L hip extension for improved upright posture and L knee extension. Attempted R/L lateral weight shifts but pt unable to sustain trunk/hip extension for upright posture during the dynamic movement despite max assist of 2 and max multimodal cuing. Transported back to room in w/c and pt requesting to perform oral care - performed sitting in w/c at sink with mod assist for set-up and trunk control. Pt left sitting in w/c with tray table on for L UE and trunk support, seat belt alarm on, and needs in reach.   Therapy Documentation Precautions:  Precautions Precautions: Fall Precaution Comments: left neglect Restrictions Weight Bearing Restrictions: No  Pain: After last bout of standing weight shifts in // bars pt reports L knee pain/discomfort - provided seated rest break for pain management and pt deferred ice stating she feels that her L hemibody pain is worse when she has a full bladder. Pt also reports dysesthesias in L UE. Nursing staff notified of pt's concern of full bladder at end of session.   Therapy/Group: Individual Therapy  Tawana Scale, PT, DPT 12/05/2018, 1:01 PM

## 2018-12-05 NOTE — Progress Notes (Signed)
Social Work Patient ID: Deanna [name], female   DOB: 07-21-1950, 67 y.o.   MRN: [id number]   CSW met with pt and with her husband via telephone to update them on team conference discussion, including pt's need for min/mod A at home after discharge and that the estimated length of stay is 3 to 3 1/2 weeks.  Pt/husband both expressed understanding and husband and his 3 dtrs plan to assist pt at home.  CSW offered husband support and information, as he is having a hard time with his wife being in the hospital.  CSW will continue to follow and assist as needed.

## 2018-12-05 NOTE — Patient Care Conference (Signed)
Inpatient RehabilitationTeam Conference and Plan of Care Update Date: 12/03/2018   Time: 10:55 AM    Patient Name: Deanna [name]      Medical Record Number: [id number]  Date of Birth: 04/26/1951 Sex: Female         Room/Bed: 4W16C/4W16C-01 Payor Info: Payor: MEDICARE / Plan: MEDICARE PART A AND B / Product Type: *No Product type* /    Admitting Diagnosis: 4. CVA 2 Team  RT. CVA; 22-24days  Admit Date/Time:  12/02/2018  3:54 PM Admission Comments: No comment available   Primary Diagnosis:  <principal problem not specified> Principal Problem: <principal problem not specified>  Patient Active Problem List   Diagnosis Date Noted  . Acute left hemiparesis ([place]) 12/02/2018  . Hyperlipemia 12/02/2018  . Prediabetes 12/02/2018  . Morbid obesity ([place]) 12/02/2018  . Chronic back pain 12/02/2018  . Urinary retention 12/02/2018  . Acute ischemic right MCA stroke ([place]) 12/02/2018  . Acute ischemic stroke ([place]) large R MCA and 2 L infarcts s/p tPA and mechanical thrombectomy 11/29/2018  . 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 S/P right knee arthroscopy 07/09/2017  . S/P knee surgery 01/10/2017    Expected Discharge Date: Expected Discharge Date: (3 to 3 1/2 weeks)  Team Members Present: Physician leading conference: Dr. Alysia Penna Social Worker Present: Alfonse Alpers, LCSW Nurse Present: Isla Pence, RN PT Present: Michaelene Song, PT OT Present: Clyda Greener, OT SLP Present: Stormy Fabian, SLP PPS Coordinator present : Gunnar Fusi, SLP     Current Status/Progress Goal Weekly Team Focus  Medical   Severe left hemiparesis with left neglect  Reduce recurrent stroke risk reduce fall risk  Initial eval's   Bowel/Bladder   Pt is continetn of bowel, LBM7/18, pt has foley in place at this time.  Pt will remain continent of bowel and will be continent of bladder.  Assist with toiliting need, educated on bladder control after the foley is removed.   Swallow/Nutrition/  Hydration            ADL's   Max assist for UB dressing with mod assist for UB bathing.  Total assist +2 (pt 30%) for LB selfcare sit to stand as well as for transfers stand pivot.  Left neglect with severe left visual field cut, diplopia, and visual processing deficits.  LUE Brunnstrum stage V in the hand and stage IV in the arm with proprioceptive and sensory deficits.  min to mod assist  selfcare retraining, balance retraining, visual retraining, transfer training, neuromuscular re-education   Mobility   eval pending  eval pending  eval pending   Communication            Safety/Cognition/ Behavioral Observations  Total A for basic cognition  Mod A  sustained attention, basic problem solving, awareness   Pain   Pt c/o of chronic back pain 4/10.  Pt will have controlled chronic back pain, <3 pain level.  Assess pain Qshift/ PRN, address needs as needed.   Skin   Pt has no obvious signs of skin breakdown. Pt is weeping on LUE.  Pt will have no skin breakdown and free eof infection.  Assess skin Qshift/ PRn    Rehab Goals Patient on target to meet rehab goals: Yes(Day of eval on day of conference) Rehab Goals Revised: none *See Care Plan and progress notes for long and short-term goals.     Barriers to Discharge  Current Status/Progress Possible Resolutions Date Resolved   Physician    Medical stability  Initiating rehab program  Continue rehab program initial eval's in progress      Nursing                  PT  Medical stability;Home environment access/layout                 OT                  SLP                SW                Discharge Planning/Teaching Needs:  Pt to return to her home with her husband and three dtrs taking turns caring for pt.  Family education closer to d/c.   Team Discussion:  Pt with large R MCA infarct.  She is obese, has edema, severe L neglect, severe L hemiparesis, trouble concentrating on eating, and is emotionally labile.  Pt has  difficulty following directions to take meds, visual deficits, foley to be removed, with voiding trials.  Pt is tearful and anxious.  Pt is mod A with UB bathing and max A for UB dressing, +2 for LB bathing and dressing.    Pt has full extension and flexion of L arm, but lacks proprioception to [place] it.  Pt is total A for sustained attention with ST for 30 seconds.  She has mod A goals for basic cognition.  PT eval is pending.   Revisions to Treatment Plan:  none    Continued Need for Acute Rehabilitation Level of Care: The patient requires daily medical management by a physician with specialized training in physical medicine and rehabilitation for the following conditions: Daily direction of a multidisciplinary physical rehabilitation program to ensure safe treatment while eliciting the highest outcome that is of practical value to the patient.: Yes Daily medical management of patient stability for increased activity during participation in an intensive rehabilitation regime.: Yes Daily analysis of laboratory values and/or radiology reports with any subsequent need for medication adjustment of medical intervention for : Neurological problems   I attest that I was present, lead the team conference, and concur with the assessment and plan of the team.Team conference was held via web/ teleconference due to [place] - 19.   Deanna [name], Silvestre Mesi 12/05/2018, 4:12 PM

## 2018-12-05 NOTE — Progress Notes (Signed)
Occupational Therapy Session Note  Patient Details  Name: Deanna [name] MRN: [id number] Date of Birth: 13-May-1951  Today's Date: 12/05/2018 OT Individual Time: 7829-5621 OT Individual Time Calculation (min): 60 min    Short Term Goals: Week 1:  OT Short Term Goal 1 (Week 1): Pt will maintain unsupported sitting EOC or bed with supervision during UB bathing. OT Short Term Goal 2 (Week 1): Pt will complete UB dressing with mod assist to donn pullover only for 3 consecutive sessions. OT Short Term Goal 3 (Week 1): Pt will complete LB dressing with max assist sit to stand following hemi techniques for donning brief and pants. OT Short Term Goal 4 (Week 1): Pt will complete toilet transfer to the drop arm commode with mod assist for squat pivot. OT Short Term Goal 5 (Week 1): Pt will locate all grooming and bathing items left of midline with no more than mod instructional cueing.  Skilled Therapeutic Interventions/Progress Updates:    1:1. Pt received on toilet with stedy around pt. OT provides external aide of running water to attempt to inspire continent void. Pt unable to void and requires min VC for midline orientation and mod VC for maintaining grasp on stedy bar with LUE. Pt able to sit to stand with MIN A in stedy to power up but requires up to MOD A with tactile cues from +2 to maintain upright posture. Pt transfers back to EOB in stedy and lies sit>supine with +2 A. Pt declined sitting EOB for meal d/t being uncomfortable from a full bladder. In supine pt focuses on gross grasp and release of OT hand while with 3+/5 gross grasp and 2-/5 digit extension along median nerve distribution. Trace extension along ulnar nerve distribution for digit extension. RN enters for pt to cath and pt misses 15 min for cathing. OT applies K tape to dorsal side of hand to manage edema and activate extensor muscles with tactile cues. Exited session with pt seated in w/c, belt alarm on, call light in reach and  breakfast tray set up  Therapy Documentation Precautions:  Precautions Precautions: Fall Precaution Comments: left neglect Restrictions Weight Bearing Restrictions: No General:   Vital Signs: Therapy Vitals Temp: 98.1 F (36.7 C) Temp Source: Oral Pulse Rate: 81 Resp: 16 BP: 127/68 Patient Position (if appropriate): Lying Oxygen Therapy SpO2: 96 % O2 Device: Room Air Pain: Pain Assessment Pain Scale: 0-10 Pain Score: 0-No pain ADL: ADL Eating: Maximal assistance Where Assessed-Eating: Bed level Grooming: Minimal assistance Where Assessed-Grooming: Sitting at sink Upper Body Bathing: Moderate assistance Where Assessed-Upper Body Bathing: Wheelchair Lower Body Bathing: Dependent Where Assessed-Lower Body Bathing: Sitting at sink, Standing at sink Upper Body Dressing: Maximal assistance Where Assessed-Upper Body Dressing: Wheelchair Lower Body Dressing: Dependent Where Assessed-Lower Body Dressing: Wheelchair Toileting: Dependent Where Assessed-Toileting: Bedside Commode Toilet Transfer: Dependent Toilet Transfer Method: Engineer, water: Drop arm bedside commode Vision   Perception    Praxis   Exercises:   Other Treatments:     Therapy/Group: Individual Therapy  Tonny Branch 12/05/2018, 8:51 AM

## 2018-12-05 NOTE — Progress Notes (Signed)
Speech Language Pathology Daily Session Note  Patient Details  Name: Deanna [name] MRN: [id number] Date of Birth: 11-Jan-1951  Today's Date: 12/05/2018 SLP Individual Time: 1109-1209 SLP Individual Time Calculation (min): 60 min  Short Term Goals: Week 1: SLP Short Term Goal 1 (Week 1): Pt will sustain attention to basic task for ~ 1 minute with Mod A cues. SLP Short Term Goal 2 (Week 1): Pt will perform basic familiar problem solving tasks related to ADLs with Mod A cues. SLP Short Term Goal 3 (Week 1): Pt will initiate basic familiar problem solving tasks in 6 out of 10 opportunities with Max A cues. SLP Short Term Goal 4 (Week 1): Pt will scan to midline in 5 out of 10 opportunities with Max A cues.  Skilled Therapeutic Interventions:  Skilled treatment session focused on cognition goals. SLP facilitated session by administering [place] Blind. Pt obtained score of 16 out of 22 (n=> 18). Pt with deficits in recall, working memory, mental math as well as complete left inattention (SLP positioned to pt's left and pt didn't look to left while interacting with SLP). Pt can be very demanding during session with off-topic requests that demonstrate lack of insight into deficits or need to participate in structured cognitive tasks. Pt left upright in wheelchair, lap belt on and all needs within reach (to right of visual field). Soft touch call bell in reach.      Pain    Therapy/Group: Individual Therapy  Guiselle Mian 12/05/2018, 12:38 PM

## 2018-12-05 NOTE — Progress Notes (Signed)
Inpatient [place] Individual Statement of Services  Patient Name:  Deanna [name]  Date:  12/05/2018  Welcome to the [facility].  Our goal is to provide you with an individualized program based on your diagnosis and situation, designed to meet your specific needs.  With this comprehensive rehabilitation program, you will be expected to participate in at least 3 hours of rehabilitation therapies Monday-Friday, with modified therapy programming on the weekends.  Your rehabilitation program will include the following services:  Physical Therapy (PT), Occupational Therapy (OT), Speech Therapy (ST), 24 hour per day rehabilitation nursing, Neuropsychology, Case Management (Social Worker), Rehabilitation Medicine, Nutrition Services and Pharmacy Services  Weekly team conferences will be held on Wednesdays to discuss your progress.  Your Social Worker will talk with you frequently to get your input and to update you on team discussions.  Team conferences with you and your family in attendance may also be held.  Expected length of stay:  3 to 3 1/2 weeks  Overall anticipated outcome: minimal/moderate assistance  Depending on your progress and recovery, your program may change. Your Social Worker will coordinate services and will keep you informed of any changes. Your Social Worker's name and contact numbers are listed  below.  The following services may also be recommended but are not provided by the [facility] will be made to provide these services after discharge if needed.  Arrangements include referral to agencies that provide these services.  Your insurance has been verified to be:  Medicare and [employer] Your primary doctor is:  Dr. Maurice Small  Pertinent information will be shared with your doctor and your insurance  company.  Social Worker:  Alfonse Alpers, LCSW  [phone] or [phone]  Information discussed with and copy given to patient by: Trey Sailors, 12/05/2018, 4:00 PM

## 2018-12-05 NOTE — IPOC Note (Signed)
Overall Plan of Care [facility]) Patient Details Name: Deanna [name] MRN: [id number] DOB: 1951/04/06  Admitting Diagnosis: <principal problem not specified>  Hospital Problems: Active Problems:   Acute ischemic right MCA stroke ([place])     Functional Problem List: Nursing    PT Balance, Endurance, Motor, Perception, Safety, Sensory  OT Balance, Cognition, Perception, Safety, Sensory, Endurance, Motor, Vision  SLP Cognition, Perception  TR         Basic ADL's: OT Eating, Grooming, Bathing, Dressing, Toileting     Advanced  ADL's: OT Simple Meal Preparation     Transfers: PT Bed Mobility, Bed to Chair, Car, Furniture, Floor  OT Toilet, Metallurgist: PT Ambulation, Emergency planning/management officer, Stairs     Additional Impairments: OT Fuctional Use of Upper Extremity  SLP Social Cognition   Social Interaction, Problem Solving, Memory, Attention, Awareness  TR      Anticipated Outcomes Item Anticipated Outcome  Self Feeding supervision  Swallowing      Basic self-care  min to mod assist  Toileting  min assist level   Bathroom Transfers min assist level  Bowel/Bladder     Transfers  min A  Locomotion  min A at w/c level  Communication     Cognition  Mod A  Pain     Safety/Judgment      Therapy Plan: PT Intensity: Minimum of 1-2 x/day ,45 to 90 minutes PT Frequency: 5 out of 7 days PT Duration Estimated Length of Stay: 21-25 days OT Intensity: Minimum of 1-2 x/day, 45 to 90 minutes OT Frequency: 5 out of 7 days OT Duration/Estimated Length of Stay: 21-23 days SLP Intensity: Minumum of 1-2 x/day, 30 to 90 minutes SLP Frequency: 3 to 5 out of 7 days SLP Duration/Estimated Length of Stay: 3 to 3.5 weeks   Due to the current state of emergency, patients may not be receiving their 3-hours of Medicare-mandated therapy.   Team Interventions: Nursing Interventions    PT interventions Ambulation/gait training, Training and development officer, Cognitive  remediation/compensation, Community reintegration, Discharge planning, Disease management/prevention, DME/adaptive equipment instruction, Functional electrical stimulation, Functional mobility training, Neuromuscular re-education, Pain management, Patient/family education, Psychosocial support, Splinting/orthotics, Stair training, Therapeutic Activities, Therapeutic Exercise, UE/LE Strength taining/ROM, UE/LE Coordination activities, Visual/perceptual remediation/compensation, Wheelchair propulsion/positioning  OT Interventions Balance/vestibular training, Discharge planning, Functional electrical stimulation, Pain management, Self Care/advanced ADL retraining, Therapeutic Activities, UE/LE Coordination activities, Cognitive remediation/compensation, Disease mangement/prevention, Functional mobility training, Patient/family education, Therapeutic Exercise, Community reintegration, DME/adaptive equipment instruction, Neuromuscular re-education, Psychosocial support, Splinting/orthotics, UE/LE Strength taining/ROM, Visual/perceptual remediation/compensation, Wheelchair propulsion/positioning  SLP Interventions Cognitive remediation/compensation, Therapeutic Activities, Patient/family education, Functional tasks  TR Interventions    SW/CM Interventions Discharge Planning, Psychosocial Support, Patient/Family Education   Barriers to Discharge MD  Medical stability  Nursing      PT Medical stability, Home environment access/layout    OT      SLP      SW       Team Discharge Planning: Destination: PT-Home ,OT- Home , SLP-(TBD) Projected Follow-up: PT-Home health PT, OT-  24 hour supervision/assistance, Home health OT, SLP-Skilled Nursing facility, 24 hour supervision/assistance Projected Equipment Needs: PT-Wheelchair (measurements), Wheelchair cushion (measurements), To be determined, OT- 3 in 1 bedside comode, Tub/shower bench, SLP-None recommended by SLP Equipment Details: PT-further equipment  TBD pending progress, OT-  Patient/family involved in discharge planning: PT- Patient,  OT-Patient, SLP-Patient  MD ELOS: 20-24d Medical Rehab Prognosis:  Good Assessment:  68 year old female with history of OA, asthma,morbid obesity,prediabetes who was admitted  on 11/29/18 with left sided tingling with LLE weakness and right gaze preference.CT head showed acute R-MCA infarct with hyperdense sign and was treated with tPA. CTA Head/neck with perfusion showed proximal right M2 occlusion with associated acute R-MCA infarct with penumbra and moderate cervical carotid artery atherosclerosis. She underwent cerebral angio with endovascular revascularization of R-MCA by Dr. Estanislado Pandy. Follow up MRI brain showed moderate acute R0MCA infarct with petechial hemorrhage and one or two acute left frontoparietal infarcts.Dr.Sethi felt that stroke was embolic due to unknown source and recommendedASA for secondary prevention andTEE for work up.On 07/20,she had increase in left sided weakness with nausea and follow up CT Head showed large edematous infarction in R-MCA with substantial increase in hypodensity and new Hemorrhagic conversion in deep white matter of posterior frontal and temporal lobes. She was treated with fluid bolus and recommendations to keep SBP <160.   She underwent TEEwith loop recorder placementtoday. TEE revealedaortic atherosclerosis in aortic arch and was negative for PFO or LAA thrombus.She has had urinary retention requiring I/O caths as well as issues with back pain and mild leucocytosis.She was started on low dose urecholine and foley placed 12/01/18.    Now requiring 24/7 Rehab RN,MD, as well as CIR level PT, OT and SLP.  Treatment team will focus on ADLs and mobility with goals set at [facility] See Team Conference Notes for weekly updates to the plan of care

## 2018-12-05 NOTE — Progress Notes (Signed)
[place] PHYSICAL MEDICINE & REHABILITATION PROGRESS NOTE   Subjective/Complaints:  Continues with severe left neglect and field cut no lower extremity pain  ROS-no chest pain shortness of breath nausea vomiting diarrhea, positive constipation objective:   Vas [place] Lower Extremity Venous (dvt)  Result Date: 12/03/2018  Lower Venous Study Indications: Stroke, and hermiparesis.  Comparison Study: no prior Performing Technologist: Abram Sander RVS  Examination Guidelines: A complete evaluation includes B-mode imaging, spectral Doppler, color Doppler, and power Doppler as needed of all accessible portions of each vessel. Bilateral testing is considered an integral part of a complete examination. Limited examinations for reoccurring indications may be performed as noted.  +---------+---------------+---------+-----------+----------+--------------+ RIGHT    CompressibilityPhasicitySpontaneityPropertiesSummary        +---------+---------------+---------+-----------+----------+--------------+ CFV      Full           Yes      Yes                                 +---------+---------------+---------+-----------+----------+--------------+ SFJ      Full                                                        +---------+---------------+---------+-----------+----------+--------------+ FV Prox  Full                                                        +---------+---------------+---------+-----------+----------+--------------+ FV Mid   Full                                                        +---------+---------------+---------+-----------+----------+--------------+ FV DistalFull                                                        +---------+---------------+---------+-----------+----------+--------------+ PFV      Full                                                        +---------+---------------+---------+-----------+----------+--------------+ POP      Full            Yes      Yes                                 +---------+---------------+---------+-----------+----------+--------------+ PTV      Full                                                        +---------+---------------+---------+-----------+----------+--------------+  PERO                                                  Not visualized +---------+---------------+---------+-----------+----------+--------------+   +---------+---------------+---------+-----------+----------+--------------+ LEFT     CompressibilityPhasicitySpontaneityPropertiesSummary        +---------+---------------+---------+-----------+----------+--------------+ CFV      Full           Yes      Yes                                 +---------+---------------+---------+-----------+----------+--------------+ SFJ      Full                                                        +---------+---------------+---------+-----------+----------+--------------+ FV Prox  Full                                                        +---------+---------------+---------+-----------+----------+--------------+ FV Mid   Full                                                        +---------+---------------+---------+-----------+----------+--------------+ FV DistalFull                                                        +---------+---------------+---------+-----------+----------+--------------+ PFV      Full                                                        +---------+---------------+---------+-----------+----------+--------------+ POP      Full           Yes      Yes                                 +---------+---------------+---------+-----------+----------+--------------+ PTV      None                                         Acute          +---------+---------------+---------+-----------+----------+--------------+ PERO                                                  Not  visualized +---------+---------------+---------+-----------+----------+--------------+  Summary: Right: There is no evidence of deep vein thrombosis in the lower extremity. No cystic structure found in the popliteal fossa. Left: Findings consistent with age indeterminate deep vein thrombosis involving the left posterior tibial veins. No cystic structure found in the popliteal fossa.  *See table(s) above for measurements and observations. Electronically signed by Curt Jews MD on 12/03/2018 at 6:07:40 PM.    Final    Recent Labs    12/03/18 0514  WBC 9.2  HGB 14.2  HCT 42.4  PLT 264   Recent Labs    12/03/18 0514  NA 138  K 3.9  CL 103  CO2 25  GLUCOSE 124*  BUN 12  CREATININE 0.85  CALCIUM 9.0    Intake/Output Summary (Last 24 hours) at 12/05/2018 1519 Last data filed at 12/05/2018 1315 Gross per 24 hour  Intake 660 ml  Output 900 ml  Net -240 ml     Physical Exam: Vital Signs Blood pressure 127/68, pulse 81, temperature 98.1 F (36.7 C), temperature source Oral, resp. rate 16, height 5\' 1"  (1.549 m), weight 107.2 kg, SpO2 96 %.   General: No acute distress Mood and affect are appropriate Heart: Regular rate and rhythm no rubs murmurs or extra sounds Lungs: Clear to auscultation, breathing unlabored, no rales or wheezes Abdomen: Positive bowel sounds, soft nontender to palpation, nondistended Extremities: No clubbing, cyanosis, or edema, no calf pain with palpation skin: No evidence of breakdown, no evidence of rash Neurologic: Cranial nerves II through XII intact, motor strength is 5/5 in Right  deltoid, bicep, tricep, grip, hip flexor, knee extensors, ankle dorsiflexor and plantar flexor, 0/5 on th eleft in these muscle groups  Sensory exam normal sensation to light touch and proprioception in right and absent in left upper and lower extremities Cerebellar exam normal finger to nose to finger as well as heel to shin in right upper and lower extremities, unable to  perform on the left side due to weakness Musculoskeletal: Full range of motion in all 4 extremities. No joint swelling Left neglect noted however was able to reach over with the right hand and touch her left hand and correctly identified.   Assessment/Plan: 1. 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  Physiatrist is providing close team supervision and 24 hour management of active medical problems listed below.  Physiatrist and rehab team continue to assess barriers to discharge/monitor patient progress toward functional and medical goals  Care Tool:  Bathing    Body parts bathed by patient: Chest, Abdomen, Left arm, Right upper leg, Left upper leg, Face   Body parts bathed by helper: Right lower leg, Left lower leg, Front perineal area, Buttocks, Right arm     Bathing assist Assist Level: 2 Helpers     Upper Body Dressing/Undressing Upper body dressing   What is the patient wearing?: Pull over shirt    Upper body assist Assist Level: Moderate Assistance - Patient 50 - 74%    Lower Body Dressing/Undressing Lower body dressing      What is the patient wearing?: Incontinence brief, Pants     Lower body assist Assist for lower body dressing: 2 Helpers     Toileting Toileting    Toileting assist Assist for toileting: Moderate Assistance - Patient 50 - 74%     Transfers Chair/bed transfer  Transfers assist     Chair/bed  transfer assist level: 2 Helpers     Locomotion Ambulation   Ambulation assist   Ambulation activity did not occur: Safety/medical concerns          Walk 10 feet activity   Assist  Walk 10 feet activity did not occur: Safety/medical concerns        Walk 50 feet activity   Assist Walk 50 feet with 2 turns activity did not occur: Safety/medical concerns          Walk 150 feet activity   Assist Walk 150 feet activity did not occur: Safety/medical concerns         Walk 10 feet on uneven surface  activity   Assist Walk 10 feet on uneven surfaces activity did not occur: Safety/medical concerns         Wheelchair     Assist Will patient use wheelchair at discharge?: Yes Type of Wheelchair: Manual    Wheelchair assist level: Maximal Assistance - Patient 25 - 49% Max wheelchair distance: 10'    Wheelchair 50 feet with 2 turns activity    Assist    Wheelchair 50 feet with 2 turns activity did not occur: Safety/medical concerns       Wheelchair 150 feet activity     Assist Wheelchair 150 feet activity did not occur: Safety/medical concerns        Medical Problem List and Plan: 1.Left hemiparesis, dysphagia  and functional deficitssecondary to large right MCA infarct -CIR PT, OT, SLP, continue program, build endurance 2. Antithrombotics: -DVT/anticoagulation:Mechanical:Sequential compression devices, below kneeBilateral lower extremities Left post tibial DVT age indeterminate, given ICH and indeterminate age will not anticoagulate, asymptomatic, recheck doppler for propagation next week  -antiplatelet therapy: Low dose ASA. 3.OA bilateral knees/Pain Management:tylenol prn. Resume Voltaren gel To bilateral knees.  4. Mood:LCSW to follow for evaluation and support. Patient "nightmare" may be more of a perceptual issue, having some intermittent recognition of the left upper extremity but sometimes has difficulty in recognizing whether this is her own limb or not -antipsychotic agents: N/A 5. Neuropsych: This patientis not fullycapable of making decisions on onown behalf. 6. Skin/Wound Care:Routine pressure relief measures. 7. Fluids/Electrolytes/Nutrition:Monitor I/O. Check lytes in am. 8.HTN: Monitor BP tid--improved control with fewer fluctuations over  last 48 hours. Would not add further bp medication at this point to avoid hypoperfusion. Vitals:   12/04/18 1924 12/05/18 0603  BP: (!) 136/54 127/68  Pulse: 94 81  Resp: 16 16  Temp: (!) 97.4 F (36.3 C) 98.1 F (36.7 C)  SpO2: 99% 96%  controlled 7/24 9. Urinary retention: Check UCS.Will continue urecholinefor voiding trialand titrate upwards as needed.Discontinue foley and start bladder training in am.Set toileting schedule, monitor PVRs and I/O caths to keep volumes <350cc 10. Prediabetes: Hgb A1c-5.9. CM diet.  11. Morbid obesity: Educate patient on CM/HH diet and importance of weight loss to help promote health and mobility.  Poor intake due to attn may lead to some gradual wt loss 12. Dyslipidemia: On Atorvastatin    LOS: 3 days A FACE TO FACE EVALUATION WAS PERFORMED  Charlett Blake 12/05/2018, 3:19 PM

## 2018-12-06 ENCOUNTER — Inpatient Hospital Stay (HOSPITAL_COMMUNITY): Payer: Medicare Other | Admitting: Speech Pathology

## 2018-12-06 ENCOUNTER — Inpatient Hospital Stay (HOSPITAL_COMMUNITY): Payer: Medicare Other | Admitting: Occupational Therapy

## 2018-12-06 ENCOUNTER — Inpatient Hospital Stay (HOSPITAL_COMMUNITY): Payer: Medicare Other | Admitting: Physical Therapy

## 2018-12-06 LAB — URINALYSIS, ROUTINE W REFLEX MICROSCOPIC
Bilirubin Urine: NEGATIVE
Glucose, UA: NEGATIVE mg/dL
Ketones, ur: NEGATIVE mg/dL
Nitrite: POSITIVE — AB
Protein, ur: NEGATIVE mg/dL
Specific Gravity, Urine: 1.004 — ABNORMAL LOW (ref 1.005–1.030)
WBC, UA: >50 WBC/hpf — ABNORMAL HIGH (ref 0–5)
pH: 7 (ref 5.0–8.0)

## 2018-12-06 LAB — GLUCOSE, CAPILLARY
Glucose-Capillary: 125 mg/dL — ABNORMAL HIGH (ref 70–99)
Glucose-Capillary: 111 mg/dL — ABNORMAL HIGH (ref 70–99)
Glucose-Capillary: 103 mg/dL — ABNORMAL HIGH (ref 70–99)
Glucose-Capillary: 114 mg/dL — ABNORMAL HIGH (ref 70–99)

## 2018-12-06 MED ORDER — NITROFURANTOIN MONOHYD MACRO 100 MG PO CAPS
100.0000 mg | ORAL_CAPSULE | Freq: Two times a day (BID) | ORAL | Status: DC
  Administered 2018-12-06 – 2018-12-07 (×4): 100 mg via ORAL
  Filled 2018-12-06 (×5): qty 1

## 2018-12-06 MED ORDER — PHENAZOPYRIDINE HCL 200 MG PO TABS
200.0000 mg | ORAL_TABLET | Freq: Three times a day (TID) | ORAL | Status: AC
  Administered 2018-12-06 – 2018-12-08 (×6): 200 mg via ORAL
  Filled 2018-12-06 (×6): qty 1

## 2018-12-06 NOTE — Progress Notes (Signed)
Deanna [name] is a 68 y.o. female who was admitted for CIR with functional deficits following a large right MCA infarction with left hemiparesis  Past Medical History:  Diagnosis Date  . Arthritis    osteoarthritis in  both knees  . Asthma   . Difficult intravenous access   . GERD (gastroesophageal reflux disease)   . Heart murmur    born with years ago  . Pre-diabetes    last A1C=6.0 per pt  . Torn meniscus    left knee w fracture x2 to left knee  . Wears glasses      Subjective: Patient has been bothered by urinary urgency and discomfort.  She has had some spontaneous incontinent urine but due to her symptoms has had frequent in and out catheterizations.  There has been no significant urinary retention.  The urgency has interfered with sleep;  urinalysis unremarkable on November 29, 2018  Objective: Vital signs in last 24 hours: Temp:  [98.4 F (36.9 C)-98.7 F (37.1 C)] 98.5 F (36.9 C) (07/25 0639) Pulse Rate:  [86-96] 88 (07/25 0639) Resp:  [16-22] 16 (07/25 0639) BP: (121-124)/(50-59) 121/59 (07/25 0639) SpO2:  [94 %-96 %] 94 % (07/25 0639) Weight change:  Last BM Date: 12/04/18  Intake/Output from previous day: 07/24 0701 - 07/25 0700 In: 720 [P.O.:720] Out: 925 [Urine:925] Last cbgs: CBG (last 3)  Recent Labs    12/05/18 1720 12/05/18 2113 12/06/18 0630  GLUCAP 115* 118* 125*   Patient Vitals for the past 24 hrs:  BP Temp Temp src Pulse Resp SpO2  12/06/18 0639 (!) 121/59 98.5 F (36.9 C) Oral 88 16 94 %  12/05/18 1945 (!) 124/50 98.4 F (36.9 C) Oral 96 20 95 %  12/05/18 1605 (!) 122/55 98.7 F (37.1 C) Oral 86 (!) 22 96 %     Physical Exam General: No apparent distress obese HEENT: not dry Lungs: Normal effort. Lungs clear to auscultation, no crackles or wheezes. Cardiovascular: Regular rate and rhythm, no edema Abdomen: S/NT/ND; BS(+) Musculoskeletal:  unchanged Neurological: No new neurological deficits with a dense left hemiparesis  Wounds:  N/A    Skin: clear  Mental state: Alert, oriented, cooperative    Lab Results: BMET    Component Value Date/Time   NA 138 12/03/2018 0514   K 3.9 12/03/2018 0514   CL 103 12/03/2018 0514   CO2 25 12/03/2018 0514   GLUCOSE 124 (H) 12/03/2018 0514   BUN 12 12/03/2018 0514   CREATININE 0.85 12/03/2018 0514   CALCIUM 9.0 12/03/2018 0514   GFRNONAA >60 12/03/2018 0514   GFRAA >60 12/03/2018 0514   CBC    Component Value Date/Time   WBC 9.2 12/03/2018 0514   RBC 4.27 12/03/2018 0514   HGB 14.2 12/03/2018 0514   HCT 42.4 12/03/2018 0514   PLT 264 12/03/2018 0514   MCV 99.3 12/03/2018 0514   MCH 33.3 12/03/2018 0514   MCHC 33.5 12/03/2018 0514   RDW 13.1 12/03/2018 0514   LYMPHSABS 1.9 12/03/2018 0514   MONOABS 0.8 12/03/2018 0514   EOSABS 0.1 12/03/2018 0514   BASOSABS 0.0 12/03/2018 0514     Medications: I have reviewed the patient's current medications.  Assessment/Plan:  Functional deficits secondary to right MCA infarction with left hemiparesis.  Continue CIR Urinary urgency and frequency.  We will check a urinalysis as well as urine culture.  Due to suggestive symptoms as well as history of frequent INO catheterizations, will treat empirically for UTI pending results of urine culture.  History of urinary retention Hypertension.  Blood pressure controlled Dyslipidemia.  Continue atorvastatin   Length of stay, days: 4  Marletta Lor , MD 12/06/2018, 10:20 AM

## 2018-12-06 NOTE — Progress Notes (Signed)
Occupational Therapy Session Note  Patient Details  Name: Deanna [name] MRN: [id number] Date of Birth: November 08, 1950  Today's Date: 12/06/2018 OT Individual Time: 1055-1200 OT Individual Time Calculation (min): 65 min    Short Term Goals: Week 1:  OT Short Term Goal 1 (Week 1): Pt will maintain unsupported sitting EOC or bed with supervision during UB bathing. OT Short Term Goal 2 (Week 1): Pt will complete UB dressing with mod assist to donn pullover only for 3 consecutive sessions. OT Short Term Goal 3 (Week 1): Pt will complete LB dressing with max assist sit to stand following hemi techniques for donning brief and pants. OT Short Term Goal 4 (Week 1): Pt will complete toilet transfer to the drop arm commode with mod assist for squat pivot. OT Short Term Goal 5 (Week 1): Pt will locate all grooming and bathing items left of midline with no more than mod instructional cueing.  Skilled Therapeutic Interventions/Progress Updates:    Pt completed transfers from the wheelchair to the therapy mat and back with max assist during session.  Transfer to the right with max assist stand pivot, transfer to the left max assist squat pivot.  Once on the mat had pt work on visual scanning and trunk control.  Therapist placed playing cards on the bedside table and had pt work on visually scanning left of midline to locate them.  Once found she would then pick them up and reach laterally to the right with the RUE to place them to a target, promoting active left side trunk shortening while reaching.  Pt demonstrates increased left lean in sitting when attempting to scan requiring min assist to maintain her balance.  She maintained increased lean to the left and demonstrated slight pusher tendencies when therapist would attempt to facilitate midline orientation to the right.  Mod demonstrational cueing with increased time to scan left of midline, by having her follow her hand, in order to located cards when asked to find  a specific one.   She also worked on [employer] functional use with sustained attention to the UE.  Gross digit flexion with 50-70% digit extension was noted with visual attention and reaching to target.  Max facilitation was needed at the shoulder and elbow.  Returned to room at end of session with pt left sitting up in the wheelchair in preparation for lunch.  Call button and phone in reach with chair alarm in place.    Therapy Documentation Precautions:  Precautions Precautions: Fall Precaution Comments: left neglect Restrictions Weight Bearing Restrictions: No  Pain: Pain Assessment Pain Scale: Faces Pain Score: 0-No pain ADL: See Care Tool Section for some details of ADL  Therapy/Group: Individual Therapy  Kyana Aicher OTR/L 12/06/2018, 12:46 PM

## 2018-12-06 NOTE — Progress Notes (Signed)
Speech Language Pathology Daily Session Note  Patient Details  Name: Deanna [name] MRN: [id number] Date of Birth: [birth year]  Today's Date: 12/06/2018 SLP Individual Time: 8325-4982 SLP Individual Time Calculation (min): 30 min  Short Term Goals: Week 1: SLP Short Term Goal 1 (Week 1): Pt will sustain attention to basic task for ~ 1 minute with Mod A cues. SLP Short Term Goal 2 (Week 1): Pt will perform basic familiar problem solving tasks related to ADLs with Mod A cues. SLP Short Term Goal 3 (Week 1): Pt will initiate basic familiar problem solving tasks in 6 out of 10 opportunities with Max A cues. SLP Short Term Goal 4 (Week 1): Pt will scan to midline in 5 out of 10 opportunities with Max A cues.  Skilled Therapeutic Interventions:  Skilled treatment session focused on cognition goals. SLP recieved pt in bed and pt required more than a reasonable amount of time arouse. Pt required Max A for arousal and sustained attention to basic task. Given this difficulty, she was not able to consistently initiate task. Pt left upright in bed, bed alarm on and all needs within reach. Continue per current plan of care.      Pain Pain Assessment Pain Scale: Faces Pain Score: 0-No pain  Therapy/Group: Individual Therapy  Randee Upchurch 12/06/2018, 3:31 PM

## 2018-12-06 NOTE — Progress Notes (Signed)
Physical Therapy Session Note  Patient Details  Name: Deanna [name] MRN: [id number] Date of Birth: 08-Feb-1951  Today's Date: 12/06/2018 PT Individual Time: 0803-0918 PT Individual Time Calculation (min): 75 min   Short Term Goals: Week 1:  PT Short Term Goal 1 (Week 1): Pt will complete least restrictive transfer with max A consistently PT Short Term Goal 2 (Week 1): Pt will initiate gait training PT Short Term Goal 3 (Week 1): Pt will perform bed mobility with mod A consistently  Skilled Therapeutic Interventions/Progress Updates:    Pt received supine in bed with RN present - pt continuing to state she is having bladder pressure discomfort and RN reports having placed an ice pack in pt's brief to assist with the pain. Pt agreeable to therapy session with encouragement. Performed supine L LE PROM for tone management and range of motion including hip flexion/abduction/adduction and knee flexion/extension as well as sustained ankle DF stretch 2x30 seconds. Performed supine L LE NMR of reversal hip/knee flexion/extension with pt able to perform hip flexion through limited ROM with assist for increased range and perform extension against min/mod resistance. MD present to assess pt and aware of pt's bladder pressure discomfort. Pt noted to be incontinent of bladder and performed rolling L with mod assist and rolling R with heavy max assist for L HB management - total assist pericare and LB clothing management - therapist reapplied ice pack in pt's brief for bladder discomfort relief - nursing staff notified. Supine>sit with heavy max assist for LE management and trunk upright. Squat pivot EOB>w/c with heavy +2 max assist for trunk control and lifting/pivoting hips.Transported to/from gym in w/c. Sit<>stand using stedy with +2 mod assist for lifting from lower surfaces and mod assist of 1 for lifting from stedy seat. Performed static standing in stedy focusing on R lateral weight shifting with mirror feedback  and max multimodal cuing for midline orientation and increased L LE hip/knee extension. Sitting balance on EOM focusing on visually scanning to locate and reach for items using R UE to promote increased R lateral lean; therapist providing manual facilitation for L UE and LE weightbearing - able to perform with CGA/min assist today demonstrating improving midline trunk awareness and improved core activation to prevent L lateral LOB. +2 stedy transfer EOM>w/c with mod assist for lifting into standing. Transported back to room and left sitting in w/c with needs in reach and seat belt alarm on.  Therapy Documentation Precautions:  Precautions Precautions: Fall Precaution Comments: left neglect Restrictions Weight Bearing Restrictions: No  Pain: Reports continued bladder pressure pain/discomfort with both RN and MD aware. Reports L knee/thigh pain during lateral scoot transfer EOB>w/c - repositioned for improved pt comfort..    Therapy/Group: Individual Therapy  Tawana Scale, PT, DPT 12/06/2018, 7:43 AM

## 2018-12-06 NOTE — Progress Notes (Signed)
Social Work Assessment and Plan   Patient Details  Name: Deanna [name] MRN: [id number] Date of Birth: [birth year]  Today's Date: 12/04/2018  Problem List:  Patient Active Problem List   Diagnosis Date Noted  . Acute left hemiparesis ([place]) 12/02/2018  . Hyperlipemia 12/02/2018  . Prediabetes 12/02/2018  . Morbid obesity ([place]) 12/02/2018  . Chronic back pain 12/02/2018  . Urinary retention 12/02/2018  . Acute ischemic right MCA stroke ([place]) 12/02/2018  . Acute ischemic stroke ([place]) large R MCA and 2 L infarcts s/p tPA and mechanical thrombectomy 11/29/2018  . 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 S/P right knee arthroscopy 07/09/2017  . S/P knee surgery 01/10/2017   Past Medical History:  Past Medical History:  Diagnosis Date  . Arthritis    osteoarthritis in  both knees  . Asthma   . Difficult intravenous access   . GERD (gastroesophageal reflux disease)   . Heart murmur    born with years ago  . Pre-diabetes    last A1C=6.0 per pt  . Torn meniscus    left knee w fracture x2 to left knee  . Wears glasses    Past Surgical History:  Past Surgical History:  Procedure Laterality Date  . BUBBLE STUDY  12/02/2018   Procedure: BUBBLE STUDY;  Surgeon: Elouise Munroe, MD;  Location: [place];  Service: Cardiology;;  . CHOLECYSTECTOMY    . CHONDROPLASTY Right 07/09/2017   Procedure: CHONDROPLASTY;  Surgeon: Sydnee Cabal, MD;  Location: [facility];  Service: Orthopedics;  Laterality: Right;  . HERNIA REPAIR Right age 42   inguinal  . IR CT HEAD LTD  11/29/2018  . IR PERCUTANEOUS ART THROMBECTOMY/INFUSION INTRACRANIAL INC DIAG ANGIO  11/29/2018  . KNEE ARTHROSCOPY WITH MEDIAL MENISECTOMY Left 01/10/2017   Procedure: LEFT KNEE ARTHROSCOPY, DEBRIDEMENT, PARTIAL MEDIAL MENISCECTOMY, CHONDROPLASTY, ARTHROSCOPIC ASSISTED INTERNAL FIXATION OF MEDIAL FEMORAL CONDYLE AND MEDIAL TIBIAL PLATEAU INSUFFICIENCY FRACTURES;  Surgeon: Sydnee Cabal, MD;   Location: [place];  Service: Orthopedics;  Laterality: Left;  . KNEE ARTHROSCOPY WITH MEDIAL MENISECTOMY Right 07/09/2017   Procedure: Right knee scope, debridement, chondroplasty, partial meniscectomy, internal arthroscopic assisted internal fixation of medial tibial plateau fracture;  Surgeon: Sydnee Cabal, MD;  Location[facility];  Service: Orthopedics;  Laterality: Right;  . LOOP RECORDER INSERTION N/A 12/02/2018   Procedure: LOOP RECORDER INSERTION;  Surgeon: Evans Lance, MD;  Location: [place] [facility];  Service: Cardiovascular;  Laterality: N/A;  . RADIOLOGY WITH ANESTHESIA N/A 11/29/2018   Procedure: RADIOLOGY WITH ANESTHESIA;  Surgeon: Luanne Bras, MD;  Location: [place];  Service: Radiology;  Laterality: N/A;  . TEE WITHOUT CARDIOVERSION N/A 12/02/2018   Procedure: TRANSESOPHAGEAL ECHOCARDIOGRAM (TEE);  Surgeon: Elouise Munroe, MD;  Location: [place];  Service: Cardiology;  Laterality: N/A;  . TUBAL LIGATION  age 33   Social History:  reports that she has never smoked. She has never used smokeless tobacco. She reports that she does not drink alcohol or use drugs.  Family / Support Systems Marital Status: Married How Long?: 40 years Patient Roles: Spouse, Parent, Other (Comment), Caregiver(friend; church member; grandmother) Spouse/Significant Other: Deanna [name] - husband - [phone] (h); [phone] (text only) Children: Deanna [name] Other Supports: extended family, friends, church Anticipated Caregiver: spouse and 3 daughters Ability/Limitations of Caregiver: spouse works part time but can work from home; youngest daughter lives down street is Therapist, sports and works from home Caregiver Availability: 24/7 Family Dynamics: Close, supportive family  Social History Preferred language: English Religion: Unknown Read: Yes Write: Yes Employment Status: Retired Date Retired/Disabled/Unemployed: 2013 Age Retired:  17 Public relations account executive Issues: none reported Guardian/Conservator: MD has determined that pt is not fully capable of making her own decisions.   Abuse/Neglect Abuse/Neglect Assessment Can Be Completed: Yes Physical Abuse: Denies Verbal Abuse: Denies Sexual Abuse: Denies Exploitation of patient/patient's resources: Denies Self-Neglect: Denies  Emotional Status Pt's affect, behavior and adjustment status: Pt was tearful at times with CSW, especially when talking to her family, but overall she is accepting of what has happened and is motivated to work hard to recover. Recent Psychosocial Issues: none reported Psychiatric History: none reported Substance Abuse History: none reported  Patient / Family Perceptions, Expectations & Goals Pt/Family understanding of illness & functional limitations: Pt is not fully aware of all of her limitations.  She can describe some.  Husband was approved today to assist pt through rehab process at her bedside, so he is beginning to learn of pt's limitaitons and condition. Premorbid pt/family roles/activities: Pt was caregiver to her grandchildren, took care of the home, colored adult coloring pages, kept household running, likes to watch the birds. Anticipated changes in roles/activities/participation: Pt wants to resume activities as she is able, but knows it will be a while. Pt/family expectations/goals: Pt wants to get home to be around her family.  Community Resources [employer]: None Premorbid Home Care/DME Agencies: None(has a RW from knee surgery) Transportation available at discharge: family Resource referrals recommended: Neuropsychology, Support group (specify)(stroke support group)  Discharge Planning Living Arrangements: Spouse/significant other Support Systems: Spouse/significant other, Children, Other relatives, Friends/neighbors, Social worker community Type of Residence: Private residence Insurance Resources: [employer],  Multimedia programmer (specify)([employer] (Medicare supplement)) Financial Resources: Fish farm manager, Family Support Financial Screen Referred: No Money Management: Patient, Spouse Does the patient have any problems obtaining your medications?: No Home Management: Pt took care of inside of home, husband did outside chores. Patient/Family Preliminary Plans: Pt plans to return home with her husband and 3 dtrs taking turns being with her. Social Work Anticipated Follow Up Needs: HH/OP, Support Group Expected length of stay: 3 to 3 1/2 weeks  Clinical Impression CSW met with pt and later called pt's husband to introduce self and role of CSW, as well as to complete assessment. Pt was talkative with CSW and emotional at times, especially when talking about her family.  Husband also seemed to be struggling with pt being on CIR and her having a difficult time.  He has now been approved to be at pt's bedside to assist physically due to pt's visual and cognitive deficits.  Pt is motivated to work on [employer] and to get better.  She has a positive attitude and is becoming more aware of her deficits, even telling her husband today that she couldn't eat a steak right now when husband offered to bring one to her.  Husband wants to be updated daily, but this may change now that he will/can be present daily.  No other questions/concerns/needs at this time, but CSW will continue to follow and assist as needed.  Deanna [name], Silvestre Mesi 12/06/2018, 1:59 AM

## 2018-12-07 ENCOUNTER — Inpatient Hospital Stay (HOSPITAL_COMMUNITY): Payer: Medicare Other | Admitting: Physical Therapy

## 2018-12-07 LAB — GLUCOSE, CAPILLARY
Glucose-Capillary: 117 mg/dL — ABNORMAL HIGH (ref 70–99)
Glucose-Capillary: 102 mg/dL — ABNORMAL HIGH (ref 70–99)
Glucose-Capillary: 109 mg/dL — ABNORMAL HIGH (ref 70–99)
Glucose-Capillary: 134 mg/dL — ABNORMAL HIGH (ref 70–99)

## 2018-12-07 NOTE — Progress Notes (Signed)
Physical Therapy Session Note  Patient Details  Name: Deanna [name] MRN: [id number] Date of Birth: 29-Mar-1951  Today's Date: 12/07/2018 PT Individual Time: 7494-4967 PT Individual Time Calculation (min): 64 min   Short Term Goals: Week 1:  PT Short Term Goal 1 (Week 1): Pt will complete least restrictive transfer with max A consistently PT Short Term Goal 2 (Week 1): Pt will initiate gait training PT Short Term Goal 3 (Week 1): Pt will perform bed mobility with mod A consistently  Skilled Therapeutic Interventions/Progress Updates:    Pt received sitting in recliner with her husband present and pt agreeable to therapy session. Sit<>stands using stedy from w/c/recliner/stedy seat all with min/mod assist of 1 person for lifting and balance upon coming to standing as pt demonstrates continued but improving L lateral lean. Donned LB clothing management max/total assist sitting/standing in stedy. Dependent stedy transfer recliner>w/c with min assist/CGA for trunk control. Transported to/from gym in w/c.  Performed sit<>stands in parallel bars with min/mod assist of 1 for lifting and balance with pt demonstrating improving ability to come to standing, improving balance with less L lean, and improving upright trunk/hip extension. In standing with R UE support on // bar with mod/max assist of 1 for balance and blocking L knee buckling (2nd person present for safety) to perform R/L lateral weight shifts with pt demonstrating improving ability to sustain upright trunk/hip/knee extension during this dynamic standing task. Attempted stepping forward/backwards with L LE but pt required total assist to lift L foot from floor with max assist for balance but then pt unable to regain upright stance with balance therefore returned to sitting in w/c with max assist for lowering. Pt reports L knee pain during standing tasks - RN notified and present for medication administration. Squat pivot w/c>EOM with heavy max assist  of 1 and mod assist of 2nd person with mod/max multimodal cuing for sequencing to increase pt participation/independence.   Sitting balance tasks focusing on trunk control and L UE NMR: - repeated L lateral trunk lean on forearm support with mod assist for trunk control and manual facilitation for elbow extension to return to midline - sustained L lateral trunk lean onto forearm support with min/mod assist while performing clothespins task with R UE to increase time in L UE weightbearing  Squat pivot EOM>w/c with heavy max assist of 1 and mod assist of 2nd person with max multimodal cuing for sequencing. Transported back to room and RN reports pt needs time on toilet. Sit<>stand in stedy with min assist of 1 for lifting/lowering. Dependent stedy transfer to [facility] over toilet. Standing with min assist during total assist LB clothing management. Pt left sitting on BSC over toilet with stedy in place, pt educated on use of call bell, RN notified of pt's position, and pt's husband present in room with patient.   Therapy Documentation Precautions:  Precautions Precautions: Fall Precaution Comments: left neglect Restrictions Weight Bearing Restrictions: No   Pain: Reports L knee pain stating it occurs with "certain movements" - noted pt grimacing when therapist blocking L knee during standing tasks or during transfers - attempted repositioning of assist for pain management and RN notified and present for medication administration.   Therapy/Group: Individual Therapy  Tawana Scale, PT, DPT 12/07/2018, 1:02 PM

## 2018-12-07 NOTE — Progress Notes (Signed)
Deanna [name] is a 68 y.o. female admitted for CIR with functional deficits following a large right MCA infarction with left hemiparesis  Past Medical History:  Diagnosis Date  . Arthritis    osteoarthritis in  both knees  . Asthma   . Difficult intravenous access   . GERD (gastroesophageal reflux disease)   . Heart murmur    born with years ago  . Pre-diabetes    last A1C=6.0 per pt  . Torn meniscus    left knee w fracture x2 to left knee  . Wears glasses      Subjective: No new complaints.  Urgency and frequency have improved slightly.  Urinalysis yesterday confirmed pyuria and patient started on Macrodantin yesterday.  Urine culture pending.  Remains afebrile  Objective: Vital signs in last 24 hours: Temp:  [97.6 F (36.4 C)-99.1 F (37.3 C)] 97.8 F (36.6 C) (07/26 1478) Pulse Rate:  [74-86] 74 (07/26 0632) Resp:  [16] 16 (07/26 2956) BP: (116-132)/(51-66) 132/66 (07/26 0632) SpO2:  [95 %-99 %] 96 % (07/26 2130) Weight change:  Last BM Date: 12/04/18  Intake/Output from previous day: 07/25 0701 - 07/26 0700 In: 400 [P.O.:400] Out: 250 [Urine:250] Last cbgs: CBG (last 3)  Recent Labs    12/06/18 1624 12/06/18 2125 12/07/18 0630  GLUCAP 103* 114* 117*   Patient Vitals for the past 24 hrs:  BP Temp Temp src Pulse Resp SpO2  12/07/18 0632 132/66 97.8 F (36.6 C) - 74 16 96 %  12/06/18 1936 (!) 116/51 99.1 F (37.3 C) Oral 86 16 95 %  12/06/18 1349 (!) 129/59 97.6 F (36.4 C) - 86 16 99 %     Physical Exam General: No apparent distress   HEENT: Moist mucosal membranes Lungs: Normal effort. Lungs clear to auscultation, no crackles or wheezes. Cardiovascular: Regular rate and rhythm, no edema Abdomen: S/NT/ND; BS(+) Musculoskeletal:  unchanged Neurological: No new neurological deficits with a dense left hemiparesis Wounds: N/A    Skin: clear   Mental state: Alert, oriented, cooperative    Lab Results: BMET    Component Value Date/Time   NA 138  12/03/2018 0514   K 3.9 12/03/2018 0514   CL 103 12/03/2018 0514   CO2 25 12/03/2018 0514   GLUCOSE 124 (H) 12/03/2018 0514   BUN 12 12/03/2018 0514   CREATININE 0.85 12/03/2018 0514   CALCIUM 9.0 12/03/2018 0514   GFRNONAA >60 12/03/2018 0514   GFRAA >60 12/03/2018 0514   CBC    Component Value Date/Time   WBC 9.2 12/03/2018 0514   RBC 4.27 12/03/2018 0514   HGB 14.2 12/03/2018 0514   HCT 42.4 12/03/2018 0514   PLT 264 12/03/2018 0514   MCV 99.3 12/03/2018 0514   MCH 33.3 12/03/2018 0514   MCHC 33.5 12/03/2018 0514   RDW 13.1 12/03/2018 0514   LYMPHSABS 1.9 12/03/2018 0514   MONOABS 0.8 12/03/2018 0514   EOSABS 0.1 12/03/2018 0514   BASOSABS 0.0 12/03/2018 0514     Medications: I have reviewed the patient's current medications.  Assessment/Plan:  Acute UTI.  We will continue Macrodantin pending urine C&S Functional deficits secondary to right MCA infarction.  Continue CIR Hypertension.  Blood pressure remains well controlled Dyslipidemia continue statin therapy    Length of stay, days: 5  Marletta Lor , MD 12/07/2018, 9:38 AM

## 2018-12-08 ENCOUNTER — Encounter (HOSPITAL_COMMUNITY): Payer: Medicare Other | Admitting: Psychology

## 2018-12-08 ENCOUNTER — Inpatient Hospital Stay (HOSPITAL_COMMUNITY): Payer: Medicare Other

## 2018-12-08 ENCOUNTER — Other Ambulatory Visit: Payer: Self-pay

## 2018-12-08 ENCOUNTER — Inpatient Hospital Stay (HOSPITAL_COMMUNITY): Payer: Medicare Other | Admitting: Physical Therapy

## 2018-12-08 ENCOUNTER — Inpatient Hospital Stay (HOSPITAL_COMMUNITY): Payer: Medicare Other | Admitting: Occupational Therapy

## 2018-12-08 ENCOUNTER — Encounter (HOSPITAL_COMMUNITY): Payer: Self-pay | Admitting: *Deleted

## 2018-12-08 DIAGNOSIS — I82402 Acute embolism and thrombosis of unspecified deep veins of left lower extremity: Secondary | ICD-10-CM

## 2018-12-08 LAB — GLUCOSE, CAPILLARY
Glucose-Capillary: 111 mg/dL — ABNORMAL HIGH (ref 70–99)
Glucose-Capillary: 102 mg/dL — ABNORMAL HIGH (ref 70–99)
Glucose-Capillary: 105 mg/dL — ABNORMAL HIGH (ref 70–99)
Glucose-Capillary: 120 mg/dL — ABNORMAL HIGH (ref 70–99)

## 2018-12-08 LAB — CBC
HCT: 41.9 % (ref 36.0–46.0)
Hemoglobin: 14.4 g/dL (ref 12.0–15.0)
MCH: 33.5 pg (ref 26.0–34.0)
MCHC: 34.4 g/dL (ref 30.0–36.0)
MCV: 97.4 fL (ref 80.0–100.0)
Platelets: 332 10*3/uL (ref 150–400)
RBC: 4.3 MIL/uL (ref 3.87–5.11)
RDW: 12.5 % (ref 11.5–15.5)
WBC: 9.4 10*3/uL (ref 4.0–10.5)
nRBC: 0 % (ref 0.0–0.2)

## 2018-12-08 LAB — BASIC METABOLIC PANEL
Anion gap: 10 (ref 5–15)
BUN: 11 mg/dL (ref 8–23)
CO2: 26 mmol/L (ref 22–32)
Calcium: 9.1 mg/dL (ref 8.9–10.3)
Chloride: 103 mmol/L (ref 98–111)
Creatinine, Ser: 0.87 mg/dL (ref 0.44–1.00)
GFR calc Af Amer: >60 mL/min (ref >60)
GFR calc non Af Amer: >60 mL/min (ref >60)
Glucose, Bld: 165 mg/dL — ABNORMAL HIGH (ref 70–99)
Potassium: 4.2 mmol/L (ref 3.5–5.1)
Sodium: 139 mmol/L (ref 135–145)

## 2018-12-08 LAB — HEPARIN LEVEL (UNFRACTIONATED): Heparin Unfractionated: 0.1 IU/mL — ABNORMAL LOW (ref 0.30–0.70)

## 2018-12-08 LAB — URINE CULTURE
Culture: >=100000 — AB
Special Requests: NORMAL

## 2018-12-08 MED ORDER — DICLOFENAC SODIUM 1 % TD GEL
2.0000 g | Freq: Four times a day (QID) | TRANSDERMAL | Status: DC
  Administered 2018-12-08 – 2018-12-26 (×71): 2 g via TOPICAL
  Filled 2018-12-08 (×2): qty 100

## 2018-12-08 MED ORDER — AMOXICILLIN 250 MG PO CAPS
250.0000 mg | ORAL_CAPSULE | Freq: Three times a day (TID) | ORAL | Status: AC
  Administered 2018-12-08 – 2018-12-14 (×21): 250 mg via ORAL
  Filled 2018-12-08 (×21): qty 1

## 2018-12-08 MED ORDER — CLOTRIMAZOLE 10 MG MT TROC
10.0000 mg | Freq: Three times a day (TID) | OROMUCOSAL | Status: AC
  Administered 2018-12-08 – 2018-12-14 (×21): 10 mg via ORAL
  Filled 2018-12-08 (×21): qty 1

## 2018-12-08 MED ORDER — HEPARIN (PORCINE) 25000 UT/250ML-% IV SOLN
1050.0000 [IU]/h | INTRAVENOUS | Status: DC
  Administered 2018-12-08: 1100 [IU]/h via INTRAVENOUS
  Administered 2018-12-09: 14:00:00 1200 [IU]/h via INTRAVENOUS
  Filled 2018-12-08 (×2): qty 250

## 2018-12-08 NOTE — Progress Notes (Signed)
Physical Therapy Session Note  Patient Details  Name: Deanna [name] MRN: [id number] Date of Birth: 06-20-1950  Today's Date: 12/08/2018 PT Individual Time: 0814-4818 and 5631-4970 PT Individual Time Calculation (min): 54 min and 30 min    Short Term Goals: Week 1:  PT Short Term Goal 1 (Week 1): Pt will complete least restrictive transfer with max A consistently PT Short Term Goal 2 (Week 1): Pt will initiate gait training PT Short Term Goal 3 (Week 1): Pt will perform bed mobility with mod A consistently  Skilled Therapeutic Interventions/Progress Updates:    Session 1: Pt supine in bed upon PT arrival, agreeable to therapy tx and denies pain at rest. Pt reports she does not want to do therapy this morning, "I have been pricked all morning and now I have this thrust." Pt agreeable to start with some exercises while in the bed. Therapist assisted pt to don night gown with assist to loop L UE through sleeve. Pt performed 2 x 10 SAQ, assisted heel slides and LE extension against resistance with L LE while supine for neuro re-ed and strengthening. Therapist performed 2 x 30 sec hamstring and heel cord stretches for ROM. Pt performed R LE 2 x 10 SLR and heel slides for strengthening. Pt agreeable to sit EOB. Supine>sitting with mod assist and cues for techniques. Pt maintained sitting balance with stand by assist and cues for midline while working on L neuro re-ed to perform x 10 L LE LAQ and x 10 L shoulder active assisted flexion/extension. Pt reports having to use the bathroom. Sit>stand within stedy with min assist, transferred bed>toilet dependently using stedy. Pt continent of bladder, required assist for clothing management and pericare. Pt brushed teeth this session with set up assist. Pt transferred to recliner with the stedy and left in recliner with needs in reach.    Session 2: Pt supine in bed upon PT arrival, pt reports that she has another clot in her LE and she is upset because she is on  bedrest. Therapist confirmed with RN that pt is on bedrest, able to perform bed level therapies only. Pt performed bed level exercises as follows for strength and neuro re-ed, 2 x 10 each with cues for techniques: R LE SLR, L LE active assisted straight leg raise, R LE hip abduction/adduction, L LE active assisted hip abd/adduction, and bridges. Therapist performed L LE hamstring, heel cord and hip adductor stretching this session 2 x 30 sec. Pt worked on L UE neuro re-ed to perform AROM elbow flexion/extension with therapist providing stabilization at the joint x 10, worked on finger movements and gripping, worked on reaching activity for grasping a cup and bring towards mouth x 5. Pt left supine in bed at end of session with needs in reach and bed alarm set.   Therapy Documentation Precautions:  Precautions Precautions: Fall Precaution Comments: left neglect Restrictions Weight Bearing Restrictions: No    Therapy/Group: Individual Therapy  Netta Corrigan, PT, DPT 12/08/2018, 7:58 AM

## 2018-12-08 NOTE — Progress Notes (Addendum)
[facility] PHYSICAL MEDICINE & REHABILITATION PROGRESS NOTE   Subjective/Complaints:  Per RN some white plaques in mouth , pt has soreness with swallow  Reviewed Micro U Cx  ROS-no chest pain shortness of breath nausea vomiting diarrhea, positive constipation objective:   No results found. No results for input(s): WBC, HGB, HCT, PLT in the last 72 hours. No results for input(s): NA, K, CL, CO2, GLUCOSE, BUN, CREATININE, CALCIUM in the last 72 hours.  Intake/Output Summary (Last 24 hours) at 12/08/2018 0829 Last data filed at 12/07/2018 1830 Gross per 24 hour  Intake 299 ml  Output -  Net 299 ml     Physical Exam: Vital Signs Blood pressure 129/65, pulse 81, temperature 98.3 F (36.8 C), resp. rate 16, height 5\' 1"  (1.549 m), weight 107.2 kg, SpO2 95 %.  ENT- white plaque on tongu and buccal surface  General: No acute distress Mood and affect are appropriate Heart: Regular rate and rhythm no rubs murmurs or extra sounds Lungs: Clear to auscultation, breathing unlabored, no rales or wheezes Abdomen: Positive bowel sounds, soft nontender to palpation, nondistended Extremities: No clubbing, cyanosis, or edema, no calf pain with palpation skin: No evidence of breakdown, no evidence of rash Neurologic: Cranial nerves II through XII intact, motor strength is 5/5 in Right  deltoid, bicep, tricep, grip, hip flexor, knee extensors, ankle dorsiflexor and plantar flexor, Left 2/5 triceps, trace biceps, 2/5 Knee ext  Sensory exam normal sensation to light touch and proprioception in right and absent in left upper and lower extremities Cerebellar exam normal finger to nose to finger as well as heel to shin in right upper and lower extremities, unable to perform on the left side due to weakness Musculoskeletal: Full range of motion in all 4 extremities. No joint swelling Left neglect noted however was able to reach over with the right hand and touch her left hand and correctly  identified.   Assessment/Plan: 1. 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  Physiatrist is providing close team supervision and 24 hour management of active medical problems listed below.  Physiatrist and rehab team continue to assess barriers to discharge/monitor patient progress toward functional and medical goals  Care Tool:  Bathing    Body parts bathed by patient: Chest, Abdomen, Left arm, Right upper leg, Left upper leg, Face   Body parts bathed by helper: Right lower leg, Left lower leg, Front perineal area, Buttocks, Right arm     Bathing assist Assist Level: 2 Helpers     Upper Body Dressing/Undressing Upper body dressing   What is the patient wearing?: Pull over shirt    Upper body assist Assist Level: Maximal Assistance - Patient 25 - 49%    Lower Body Dressing/Undressing Lower body dressing      What is the patient wearing?: Pants, Incontinence brief     Lower body assist Assist for lower body dressing: Total Assistance - Patient < 25%     Toileting Toileting    Toileting assist Assist for toileting: Total Assistance - Patient < 25%     Transfers Chair/bed transfer  Transfers assist     Chair/bed transfer assist level: 2 Helpers     Locomotion Ambulation   Ambulation assist   Ambulation activity did not occur: Safety/medical concerns          Walk 10 feet activity   Assist  Walk 10 feet activity did not occur: Safety/medical concerns        Walk 50 feet activity   Assist Walk 50 feet with 2 turns activity did not occur: Safety/medical concerns         Walk 150 feet activity   Assist Walk 150 feet activity did not occur: Safety/medical concerns         Walk 10 feet on uneven surface  activity   Assist Walk 10 feet on uneven surfaces activity did not  occur: Safety/medical concerns         Wheelchair     Assist Will patient use wheelchair at discharge?: Yes Type of Wheelchair: Manual    Wheelchair assist level: Maximal Assistance - Patient 25 - 49% Max wheelchair distance: 10'    Wheelchair 50 feet with 2 turns activity    Assist    Wheelchair 50 feet with 2 turns activity did not occur: Safety/medical concerns       Wheelchair 150 feet activity     Assist Wheelchair 150 feet activity did not occur: Safety/medical concerns        Medical Problem List and Plan: 1.Left hemiparesis, dysphagia  and functional deficitssecondary to large right MCA infarct -CIR PT, OT, SLP, continue program, neglect improving 2. Antithrombotics: -DVT/anticoagulation:Mechanical:Sequential compression devices, below kneeBilateral lower extremities Left post tibial DVT age indeterminate, given ICH and indeterminate age will not anticoagulate, asymptomatic, recheck doppler for propagation next week  -antiplatelet therapy: Low dose ASA. 3.OA bilateral knees/Pain Management:tylenol prn. Resume Voltaren gel To bilateral knees.  4. Mood:LCSW to follow for evaluation and support. Patient "nightmare" may be more of a perceptual issue, having some intermittent recognition of the left upper extremity but sometimes has difficulty in recognizing whether this is her own limb or not -antipsychotic agents: N/A 5. Neuropsych: This patientis not fullycapable of making decisions on onown behalf. 6. Skin/Wound Care:Routine pressure relief measures. 7. Fluids/Electrolytes/Nutrition:Monitor I/O. Check lytes in am. 8.HTN: Monitor BP tid--improved control with fewer fluctuations over last 48 hours. Would not add further bp medication at this point to avoid hypoperfusion. Vitals:   12/07/18 2139 12/08/18 0353  BP: (!) 111/57 129/65  Pulse: 84 81  Resp: 16 16  Temp: 98.5 F (36.9 C) 98.3 F  (36.8 C)  SpO2: 94% 95%  controlled 7/27 9. UTI R to Macrobid , no Allergies, change to amoxil10. Prediabetes: Hgb A1c-5.9. CM diet.  11. Morbid obesity: Educate patient on CM/HH diet and importance of weight loss to help promote health and mobility.  Poor intake due to attn may lead to some gradual wt loss 12. Dyslipidemia: On Atorvastatin    LOS: 6 days A FACE TO FACE EVALUATION WAS PERFORMED  Charlett Blake 12/08/2018, 8:29 AM

## 2018-12-08 NOTE — Progress Notes (Signed)
Occupational Therapy Session Note  Patient Details  Name: Deanna [name] MRN: [id number] Date of Birth: [birth year]  Today's Date: 12/08/2018 OT Individual Time: 5537-4827 OT Individual Time Calculation (min): 72 min    Short Term Goals: Week 1:  OT Short Term Goal 1 (Week 1): Pt will maintain unsupported sitting EOC or bed with supervision during UB bathing. OT Short Term Goal 2 (Week 1): Pt will complete UB dressing with mod assist to donn pullover only for 3 consecutive sessions. OT Short Term Goal 3 (Week 1): Pt will complete LB dressing with max assist sit to stand following hemi techniques for donning brief and pants. OT Short Term Goal 4 (Week 1): Pt will complete toilet transfer to the drop arm commode with mod assist for squat pivot. OT Short Term Goal 5 (Week 1): Pt will locate all grooming and bathing items left of midline with no more than mod instructional cueing.  Skilled Therapeutic Interventions/Progress Updates:    Pt currently on bedrest so began session with pt in the bed and working on self feeding using the dominant RUE.  Visual scanning encouraged to the left side for locating her drink and her pizza which was strategically placed left of midline at times.  Progressed to visual scanning tasks and letter cancellation table top task.  Pt was given large print worksheets and instructions to cross off the letter of each line given as it appears in the line.  Therapist helped educate pt on compensation techniques of scanning all the way to the left using her finger until she reached the edge of the paper that demonstrated contrast with the bedside table.  Also provided sheet of paper to have her use as a guide as well to help eliminate all other lines except the one she was working on.  She was able to demonstrate 95% accuracy with use of the paper and her finger.  When the paper was removed she demonstrated approximately 85% accuracy.  Finished session with pt in the bed and call  button, phone, and bed alarm in reach.  Left worksheet for pt to continue working on as well.   Therapy Documentation Precautions:  Precautions Precautions: Fall Precaution Comments: left neglect Restrictions Weight Bearing Restrictions: No   Pain: Pain Assessment Pain Scale: Faces Pain Score: 0-No pain   Therapy/Group: Individual Therapy  Deanna [name] OTR/L 12/08/2018, 2:15 PM

## 2018-12-08 NOTE — Progress Notes (Signed)
VASCULAR LAB PRELIMINARY  PRELIMINARY  PRELIMINARY  PRELIMINARY  Left lower extremity venous duplex completed.    Preliminary report:  See CV proc for preliminary results.   Gave results to doctor and RN  Sharion Dove, RVT 12/08/2018, 10:38 AM

## 2018-12-08 NOTE — Consult Note (Signed)
Neuropsychological Consultation   Patient:   Deanna [name]   DOB:   May 16, 1950  MR Number:  [id number]  Location:  [facility] [id number] [place] [address] Dept: [place] [facility]: [phone]           Date of Service:   12/08/2018  Start Time:   9 AM End Time:   10 AM  Provider/Observer:  Ilean Skill, Psy.D.       Clinical Neuropsychologist       Billing Code/Service: [phone]  Chief Complaint:    Deanna [name] is a 68 year old female with history of OA, asthma, obesity, prediabetes.  Patient was admitted on 11/29/2018 with left sided tingling with LLE weakness and right gaze preference.  MRI has shown moderate acute R-MCA infarct with petechial hemorrhage and one or two left frontoparietal infarcts.  Felt that stroke was embolic due to unknown source.  On 12/01/18 patient had increased in left sided weakness and follow-up CT showed large edematous infarction in R-MCA with substantial increase in hypodensity and new Hemorrhage conversion in deep white matter of posterior frontal and temporal lobes.  Patient initially with considerable dense neglect issues and confusion.  Emotional distress to point of disproportional response similar to sublobular affect like symptoms, difficulty with initiation, sequency and poor awareness of deficits.  .  Patient admitted to CIR for rehab services.  Reason for Service:  HPI:Deanna [name] is a 68 year old female with history of OA, asthma,morbid obesity,prediabetes who was admitted on 11/29/18 with left sided tingling with LLE weakness and right gaze preference.CT head showed acute R-MCA infarct with hyperdense sign and was treated with tPA. CTA Head/neck with perfusion showed proximal right M2 occlusion with associated acute R-MCA infarct with penumbra and moderate cervical carotid artery atherosclerosis. She underwent cerebral angio with endovascular  revascularization of R-MCA by Dr. Estanislado Pandy. Follow up MRI brain showed moderate acute R0MCA infarct with petechial hemorrhage and one or two acute left frontoparietal infarcts.Dr.Sethi felt that stroke was embolic due to unknown source and recommendedASA for secondary prevention andTEE for work up.On 07/20,she had increase in left sided weakness with nausea and follow up CT Head showed large edematous infarction in R-MCA with substantial increase in hypodensity and new Hemorrhagic conversion in deep white matter of posterior frontal and temporal lobes. She was treated with fluid bolus and recommendations to keep SBP <160.   She underwent TEEwith loop recorder placementtoday. TEE revealedaortic atherosclerosis in aortic arch and was negative for PFO or LAA thrombus.She has had urinary retention requiring I/O caths as well as issues with back pain and mild leucocytosis.She was started on low dose urecholine and foley placed 12/01/18. Patient with resultant left sided weakness with sensory deficits, left inattention, left hemiopsia with right gaze preference, difficulty with initiation. Problems with sequencing and poor awareness of deficits.  Current Status:  Patient showing some significant improvement with awareness.  Patient was oriented today and showed improvement in awareness of left arm and was able to move fingers some.  She self initiated the attempts.  She reported more awareness of left side and reported that her mood is better.  Memory is improving and patient was able to describe many events that had occurred over weekend and today.  Patient aware of strokes.  Improvement in cog response times.  Mood was stable and appropriate today for my visit.  Mood may be improving.    Behavioral Observation: Claiborne Rigg  presents as a 68 y.o.-year-old Right Caucasian Female who appeared her stated age. her dress was Appropriate and she was Well Groomed and her manners were Appropriate to  the situation.  her participation was indicative of Appropriate and Redirectable behaviors.  There were any physical disabilities noted.  she displayed an appropriate level of cooperation and motivation.     Interactions:    Active Appropriate and Redirectable  Attention:   abnormal and attention span appeared shorter than expected for age  Memory:   within normal limits; recent and remote memory intact  Visuo-spatial:  abnormal  Speech (Volume):  normal  Speech:   slurred;   Thought Process:  Coherent and Relevant  Though Content:  WNL; not suicidal and not homicidal  Orientation:   person, place and situation  Judgment:   Fair  Planning:   Poor  Affect:    Anxious  Mood:    Anxious  Insight:   Fair  Intelligence:   normal   Medical History:   Past Medical History:  Diagnosis Date  . Arthritis    osteoarthritis in  both knees  . Asthma   . Difficult intravenous access   . GERD (gastroesophageal reflux disease)   . Heart murmur    born with years ago  . Pre-diabetes    last A1C=6.0 per pt  . Torn meniscus    left knee w fracture x2 to left knee  . Wears glasses     Psychiatric History:  No prior psychiatric history.  Family Med/Psych History:  Family History  Problem Relation Age of Onset  . Stroke Father   . Stroke Brother     Impression/DX:  Deanna [name] is a 68 year old female with history of OA, asthma, obesity, prediabetes.  Patient was admitted on 11/29/2018 with left sided tingling with LLE weakness and right gaze preference.  MRI has shown moderate acute R-MCA infarct with petechial hemorrhage and one or two left frontoparietal infarcts.  Felt that stroke was embolic due to unknown source.  On 12/01/18 patient had increased in left sided weakness and follow-up CT showed large edematous infarction in R-MCA with substantial increase in hypodensity and new Hemorrhage conversion in deep white matter of posterior frontal and temporal lobes.  Patient  initially with considerable dense neglect issues and confusion.  Emotional distress to point of disproportional response similar to sublobular affect like symptoms, difficulty with initiation, sequency and poor awareness of deficits.  .  Patient admitted to CIR for rehab services.  Patient showing some significant improvement with awareness.  Patient was oriented today and showed improvement in awareness of left arm and was able to move fingers some.  She self initiated the attempts.  She reported more awareness of left side and reported that her mood is better.  Memory is improving and patient was able to describe many events that had occurred over weekend and today.  Patient aware of strokes.  Improvement in cog response times.  Mood was stable and appropriate today for my visit.  Mood may be improving.    Worked on coping and adjustment with recent stroke.         Electronically Signed   _______________________ Ilean Skill, Psy.D.

## 2018-12-08 NOTE — Progress Notes (Signed)
Speech Language Pathology Daily Session Note  Patient Details  Name: Deanna [name] MRN: [id number] Date of Birth: [birth year]  Today's Date: 12/08/2018 SLP Individual Time: 1445-1530 SLP Individual Time Calculation (min): 45 min  Short Term Goals: Week 1: SLP Short Term Goal 1 (Week 1): Pt will sustain attention to basic task for ~ 1 minute with Mod A cues. SLP Short Term Goal 2 (Week 1): Pt will perform basic familiar problem solving tasks related to ADLs with Mod A cues. SLP Short Term Goal 3 (Week 1): Pt will initiate basic familiar problem solving tasks in 6 out of 10 opportunities with Max A cues. SLP Short Term Goal 4 (Week 1): Pt will scan to midline in 5 out of 10 opportunities with Max A cues.  Skilled Therapeutic Interventions:  Skilled ST services focused on cognitive skills. Pt was on bedside toilet with nurse in room, upon entering room. Pt was unable to void and SLP transferred back to bed with stedy. SLP facilitated task initiation, basic problem solving and sustained attention with two tasks, sorting shapes by 5 colors and cards by 6 colors with various numbers/shapes also present. Pt demonstrated ability to sort shapes by color min A verbal cues for problem solving. Pt demonstrated ability to sort cards by color with min A verbal cues for problem solving and sustained attention in 4 minute intervals. In both task pt demonstrated mod I for initiation of task and required mod A fade to min a verbal cues to scan to midline. Pt became internally distracted during task, verbally preservating on upcoming IV placement. SLP provided emotional support when IV team arrived. Pt was left in room with, NT, IV team, call bell within reach and bed alarm set. ST recommends to continue skilled ST services.      Pain Pain Assessment Pain Scale: Faces Pain Score: 0-No pain  Therapy/Group: Individual Therapy  Zyair Russi  [facility] 12/08/2018, 3:32 PM

## 2018-12-08 NOTE — Progress Notes (Signed)
ANTICOAGULATION CONSULT NOTE  Pharmacy Consult for heparin Indication: acute DVT   No Known Allergies  Patient Measurements: Height: 5\' 1"  (154.9 cm) Weight: 236 lb 5.3 oz (107.2 kg) IBW/kg (Calculated) : 47.8 Heparin Dosing Weight: 74 kg  Vital Signs: Temp: 98.2 F (36.8 C) (07/27 1943) Temp Source: Oral (07/27 1428) BP: 133/62 (07/27 1943) Pulse Rate: 94 (07/27 1943)  Labs: Recent Labs    12/08/18 0932 12/08/18 2106  HGB 14.4  --   HCT 41.9  --   PLT 332  --   HEPARINUNFRC  --  0.10*  CREATININE 0.87  --     Estimated Creatinine Clearance: 69 mL/min (by C-G formula based on SCr of 0.87 mg/dL).   Medical History: Past Medical History:  Diagnosis Date  . Arthritis    osteoarthritis in  both knees  . Asthma   . Difficult intravenous access   . GERD (gastroesophageal reflux disease)   . Heart murmur    born with years ago  . Pre-diabetes    last A1C=6.0 per pt  . Torn meniscus    left knee w fracture x2 to left knee  . Wears glasses     Medications:  Medications Prior to Admission  Medication Sig Dispense Refill Last Dose  . aspirin 81 MG chewable tablet Chew 1 tablet (81 mg total) by mouth daily.     Marland Kitchen atorvastatin (LIPITOR) 40 MG tablet Take 1 tablet (40 mg total) by mouth daily at 6 PM.     . bethanechol (URECHOLINE) 5 MG tablet Take 1 tablet (5 mg total) by mouth 3 (three) times daily.     . ranitidine (ZANTAC) 150 MG tablet Take 150 mg by mouth daily as needed for heartburn.      . senna-docusate (SENOKOT-S) 8.6-50 MG tablet Take 1 tablet by mouth at bedtime as needed for mild constipation.     . sodium chloride 0.9 % infusion Inject 75 mLs into the vein continuous.  0     Assessment: 68 y/o female admitted 7/18 for right MCA stroke and given tPA. On 7/20 she had increased left sided weakness and nausea. MRI showed a large right MCA infarction and new hemorrhagic conversion. LE venous doppler from today showed progression of acute DVT since doppler on  7/22. Pharmacy consulted to begin IV heparin for 2 days with reduced heparin level goal for recent stroke. No bleeding noted, CBC is normal.  Heparin level below goal this evening at 0.10. No bleeding issues noted. Will adjust rate.   Goal of Therapy:  Heparin level 0.3-0.5 units/ml Monitor platelets by anticoagulation protocol: Yes   Plan:  No heparin bolus Increase heparin drip to 1300 units/hr 6 hr heparin level Daily heparin level and CBC Monitor for s/sx of bleeding Plan to transition to apixaban 5 mg PO bid if no neuro changes    Thank you for involving pharmacy in this patient's care.  Erin Hearing PharmD., BCPS Clinical Pharmacist 12/08/2018 10:02 PM

## 2018-12-08 NOTE — Progress Notes (Signed)
ANTICOAGULATION CONSULT NOTE - Initial Consult  Pharmacy Consult for heparin Indication: acute DVT   No Known Allergies  Patient Measurements: Height: 5\' 1"  (154.9 cm) Weight: 236 lb 5.3 oz (107.2 kg) IBW/kg (Calculated) : 47.8 Heparin Dosing Weight: 74 kg  Vital Signs: Temp: 98.3 F (36.8 C) (07/27 0353) BP: 129/65 (07/27 0353) Pulse Rate: 81 (07/27 0353)  Labs: Recent Labs    12/08/18 0932  HGB 14.4  HCT 41.9  PLT 332  CREATININE 0.87    Estimated Creatinine Clearance: 69 mL/min (by C-G formula based on SCr of 0.87 mg/dL).   Medical History: Past Medical History:  Diagnosis Date  . Arthritis    osteoarthritis in  both knees  . Asthma   . Difficult intravenous access   . GERD (gastroesophageal reflux disease)   . Heart murmur    born with years ago  . Pre-diabetes    last A1C=6.0 per pt  . Torn meniscus    left knee w fracture x2 to left knee  . Wears glasses     Medications:  Medications Prior to Admission  Medication Sig Dispense Refill Last Dose  . aspirin 81 MG chewable tablet Chew 1 tablet (81 mg total) by mouth daily.     Marland Kitchen atorvastatin (LIPITOR) 40 MG tablet Take 1 tablet (40 mg total) by mouth daily at 6 PM.     . bethanechol (URECHOLINE) 5 MG tablet Take 1 tablet (5 mg total) by mouth 3 (three) times daily.     . ranitidine (ZANTAC) 150 MG tablet Take 150 mg by mouth daily as needed for heartburn.      . senna-docusate (SENOKOT-S) 8.6-50 MG tablet Take 1 tablet by mouth at bedtime as needed for mild constipation.     . sodium chloride 0.9 % infusion Inject 75 mLs into the vein continuous.  0     Assessment: 68 y/o female admitted 7/18 for right MCA stroke and given tPA. On 7/20 she had increased left sided weakness and nausea. MRI showed a large right MCA infarction and new hemorrhagic conversion. LE venous doppler from today showed progression of acute DVT since doppler on 7/22. Pharmacy consulted to begin IV heparin for 2 days with reduced  heparin level goal for recent stroke. No bleeding noted, CBC is normal.  Goal of Therapy:  Heparin level 0.3-0.5 units/ml Monitor platelets by anticoagulation protocol: Yes   Plan:  No heparin bolus Begin heparin drip at 1100 units/hr 6 hr heparin level Daily heparin level and CBC Monitor for s/sx of bleeding Plan to transition to apixaban 5 mg PO bid if no neuro changes    Thank you for involving pharmacy in this patient's care.  Renold Genta, PharmD, BCPS Clinical Pharmacist Clinical phone for 12/08/2018 until 3p is [phone] 12/08/2018 2:07 PM  **Pharmacist phone directory can be found on [email or web address] listed under [place]**

## 2018-12-08 NOTE — Plan of Care (Signed)
Discussed with Deanna [name] about pt repeat LE venous doppler which showed progression of acute DVT from left PTV to left popliteal vein. I think anticoagulation is indicated at this time. Given Deanna [name], agree to repeat CT to rule out any new bleeding or hematoma. If unchanged or resolution of previous faint hyperdensity on CT, will recommend heparin IV per stroke protocol (no bolus, heparin level 0.3-0.5) for 2 days, if pt tolerating well without neuro changes, heparin IV can be transitioned to eliquis 5mg  bid.   Rosalin Hawking, MD PhD Stroke Neurology 12/08/2018 10:59 AM

## 2018-12-09 ENCOUNTER — Inpatient Hospital Stay (HOSPITAL_COMMUNITY): Payer: Medicare Other

## 2018-12-09 ENCOUNTER — Inpatient Hospital Stay (HOSPITAL_COMMUNITY): Payer: Medicare Other | Admitting: Occupational Therapy

## 2018-12-09 ENCOUNTER — Inpatient Hospital Stay (HOSPITAL_COMMUNITY): Payer: Medicare Other | Admitting: *Deleted

## 2018-12-09 LAB — CBC
HCT: 40.9 % (ref 36.0–46.0)
Hemoglobin: 13.6 g/dL (ref 12.0–15.0)
MCH: 32.9 pg (ref 26.0–34.0)
MCHC: 33.3 g/dL (ref 30.0–36.0)
MCV: 98.8 fL (ref 80.0–100.0)
Platelets: 328 10*3/uL (ref 150–400)
RBC: 4.14 MIL/uL (ref 3.87–5.11)
RDW: 12.7 % (ref 11.5–15.5)
WBC: 7.9 10*3/uL (ref 4.0–10.5)
nRBC: 0 % (ref 0.0–0.2)

## 2018-12-09 LAB — HEPARIN LEVEL (UNFRACTIONATED)
Heparin Unfractionated: 0.64 IU/mL (ref 0.30–0.70)
Heparin Unfractionated: 0.15 IU/mL — ABNORMAL LOW (ref 0.30–0.70)
Heparin Unfractionated: 0.67 IU/mL (ref 0.30–0.70)

## 2018-12-09 LAB — GLUCOSE, CAPILLARY
Glucose-Capillary: 115 mg/dL — ABNORMAL HIGH (ref 70–99)
Glucose-Capillary: 111 mg/dL — ABNORMAL HIGH (ref 70–99)
Glucose-Capillary: 121 mg/dL — ABNORMAL HIGH (ref 70–99)
Glucose-Capillary: 135 mg/dL — ABNORMAL HIGH (ref 70–99)

## 2018-12-09 NOTE — Progress Notes (Signed)
Physical Therapy Session Note  Patient Details  Name: Deanna [name] MRN: [id number] Date of Birth: Aug 31, 1950  Today's Date: 12/09/2018 PT Individual Time: 1115-1200 and 1415-1500 PT Individual Time Calculation (min): 45 min and 45 min   Short Term Goals: Week 1:  PT Short Term Goal 1 (Week 1): Pt will complete least restrictive transfer with max A consistently PT Short Term Goal 2 (Week 1): Pt will initiate gait training PT Short Term Goal 3 (Week 1): Pt will perform bed mobility with mod A consistently  Skilled Therapeutic Interventions/Progress Updates:    Session 1: Pt supine in bed upon PT arrival, agreeable to therapy tx and denies pain at rest, reports occasional L LE pain when moving extremity to don sock. Therapist communicated with PA-C regarding bed level therapy orders, PA-C (Pam) reported that pt could participate in sitting EOB activities and standing from the bed within the room this session. Pt transferred to sitting EOB with mod assist, cues for techniques and use of bedrails. Pt performed x 2 sit<>stands this session with RW and min assist, therapist blocking L knee, working on LE weightbearing and standing tolerance. Pt maintained seated balance EOB throughout session while working on L UE AROM and AAROM of the shoulder, elbow, wrist and fingers with verbal and visual cues for techniques/activation/L attention. Pt worked on L UE reaching task for neuro re-ed with active assist to grasp bean bags and place in bucket, x 15 bean bags. Pt worked on L LE neuro re-ed to perform ball kicking task while seated EOB x 12 kicks. Pt transferred to supine mod assist, left with needs in reach and bed alarm set.   Session 2: Pt supine in bed upon PT arrival, pt agreeable to therapy tx however pt requesting to stay in bed and not move R arm since her IV keeps getting disrupted. Pt worked on bed level exercises this session with focus on L attention and L UE neuro re-ed. Pt supine in bed performed  L UE AROM and AAROM of the shoulder, elbow, wrist and fingers with verbal and visual cues for techniques/activation/L attention. Pt progressed to performing the following L UE movements against manual resistance - 2 x 10 elbow flexion, 2 x 10 elbow extension, 2 x 10 wrist extension. Pt worked on L UE PNF pattern D1 flexion for neuromuscular re-education, 2 x 10. Therapist performed L LE hamstring and heel cord stretching 2 x 1 min each. Pt left supine in bed at end of session with needs in reach and bed alarm set.   Therapy Documentation Precautions:  Precautions Precautions: Fall Precaution Comments: left neglect Restrictions Weight Bearing Restrictions: No   Therapy/Group: Individual Therapy  Netta Corrigan, PT, DPT 12/09/2018, 7:45 AM

## 2018-12-09 NOTE — Progress Notes (Signed)
ANTICOAGULATION CONSULT NOTE - Follow-up Pharmacy Consult for heparin Indication: acute DVT   No Known Allergies  Patient Measurements: Height: 5\' 1"  (154.9 cm) Weight: 236 lb 5.3 oz (107.2 kg) IBW/kg (Calculated) : 47.8 Heparin Dosing Weight: 74 kg  Vital Signs: Temp: 98.7 F (37.1 C) (07/28 2036) Temp Source: Oral (07/28 2036) BP: 151/68 (07/28 2036) Pulse Rate: 81 (07/28 2036)  Labs: Recent Labs    12/08/18 0932  12/09/18 0642 12/09/18 1518 12/09/18 2125  HGB 14.4  --  13.6  --   --   HCT 41.9  --  40.9  --   --   PLT 332  --  328  --   --   HEPARINUNFRC  --    < > 0.64 0.15* 0.67  CREATININE 0.87  --   --   --   --    < > = values in this interval not displayed.    Estimated Creatinine Clearance: 69 mL/min (by C-G formula based on SCr of 0.87 mg/dL).   Medical History: Past Medical History:  Diagnosis Date  . Arthritis    osteoarthritis in  both knees  . Asthma   . Difficult intravenous access   . GERD (gastroesophageal reflux disease)   . Heart murmur    born with years ago  . Pre-diabetes    last A1C=6.0 per pt  . Torn meniscus    left knee w fracture x2 to left knee  . Wears glasses     Medications:  Medications Prior to Admission  Medication Sig Dispense Refill Last Dose  . aspirin 81 MG chewable tablet Chew 1 tablet (81 mg total) by mouth daily.     Marland Kitchen atorvastatin (LIPITOR) 40 MG tablet Take 1 tablet (40 mg total) by mouth daily at 6 PM.     . bethanechol (URECHOLINE) 5 MG tablet Take 1 tablet (5 mg total) by mouth 3 (three) times daily.     . ranitidine (ZANTAC) 150 MG tablet Take 150 mg by mouth daily as needed for heartburn.      . senna-docusate (SENOKOT-S) 8.6-50 MG tablet Take 1 tablet by mouth at bedtime as needed for mild constipation.     . sodium chloride 0.9 % infusion Inject 75 mLs into the vein continuous.  0     Assessment: 68 y/o female admitted 7/18 for right MCA stroke and given tPA. On 7/20 she had increased left sided  weakness and nausea. MRI showed a large right MCA infarction and new hemorrhagic conversion. LE venous doppler from today showed progression of acute DVT since doppler on 7/22. Pharmacy consulted to begin IV heparin for 2 days with reduced heparin level goal for recent stroke.   Heparin level (0.67) elevated on heparin drip rate 1200 uts/hr - after IV adjusted see below earlier  this AM HL 0.15 units/mL. The patient's RN stated  the IV has not been working properly (beeping a lot when pt bends R arm), so pt has likely not received the heparin infusion as ordered, so level this afternoon is likely unreliable.  Goal of Therapy:  Heparin level 0.3-0.5 units/ml Monitor platelets by anticoagulation protocol: Yes   Plan:  Decrease heparin drip to 1050 units/hr Daily heparin level and CBC Monitor for s/sx of bleeding  Bonnita Nasuti Pharm.D. CPP, BCPS Clinical Pharmacist [phone] 12/09/2018 10:17 PM   .

## 2018-12-09 NOTE — Progress Notes (Signed)
Occupational Therapy Session Note  Patient Details  Name: Deanna [name] MRN: [id number] Date of Birth: 06/19/1950  Today's Date: 12/09/2018 OT Individual Time: 3838-1840 OT Individual Time Calculation (min): 60 min    Short Term Goals: Week 1:  OT Short Term Goal 1 (Week 1): Pt will maintain unsupported sitting EOC or bed with supervision during UB bathing. OT Short Term Goal 2 (Week 1): Pt will complete UB dressing with mod assist to donn pullover only for 3 consecutive sessions. OT Short Term Goal 3 (Week 1): Pt will complete LB dressing with max assist sit to stand following hemi techniques for donning brief and pants. OT Short Term Goal 4 (Week 1): Pt will complete toilet transfer to the drop arm commode with mod assist for squat pivot. OT Short Term Goal 5 (Week 1): Pt will locate all grooming and bathing items left of midline with no more than mod instructional cueing.     Skilled Therapeutic Interventions/Progress Updates:    Pt received in bed. She is still on bed rest at this time. Pt eating breakfast with nurse tech and then continued with this therapist. Placed her plate on L side of tray so she would have to scan to the L.  Pt continually sliding plate back to her R side.  She could scan and find items on L side of tray.  Pt connected to IV so she could not change her dress but did work on UB bathing with mod A.  Brushed teeth with set up and A to hold spit tray, combed hair.   Worked on positioning in bed as she kept sliding to her L.  Once positioned in midline, focused on LUE AROM with A/arom to L shoulder with flex and then resisted extension and bringing hand to R shoulder with A and then a resisted push to bring hand to L hip.  AROM of wrist and finger extension and active grasping practice holding objects. Elbow flexion AArom to touch hand to chin and resisted elbow extension.   Provided pt with foam sponge to practice squeezing.   Pt resting in bed with soft call light in reach  and all needs met. Bed alarm set.    Therapy Documentation Precautions:  Precautions Precautions: Fall Precaution Comments: left neglect Restrictions Weight Bearing Restrictions: No   Pain: No c/o pain     Therapy/Group: Individual Therapy  Edwinna Rochette 12/09/2018, 12:50 PM

## 2018-12-09 NOTE — Progress Notes (Signed)
ANTICOAGULATION CONSULT NOTE - Follow-up Pharmacy Consult for heparin Indication: acute DVT   No Known Allergies  Patient Measurements: Height: 5\' 1"  (154.9 cm) Weight: 236 lb 5.3 oz (107.2 kg) IBW/kg (Calculated) : 47.8 Heparin Dosing Weight: 74 kg  Vital Signs: Temp: 98 F (36.7 C) (07/28 1340) Temp Source: Oral (07/28 1340) BP: 141/63 (07/28 1340) Pulse Rate: 77 (07/28 1340)  Labs: Recent Labs    12/08/18 0932 12/08/18 2106 12/09/18 0642 12/09/18 1518  HGB 14.4  --  13.6  --   HCT 41.9  --  40.9  --   PLT 332  --  328  --   HEPARINUNFRC  --  0.10* 0.64 0.15*  CREATININE 0.87  --   --   --     Estimated Creatinine Clearance: 69 mL/min (by C-G formula based on SCr of 0.87 mg/dL).   Medical History: Past Medical History:  Diagnosis Date  . Arthritis    osteoarthritis in  both knees  . Asthma   . Difficult intravenous access   . GERD (gastroesophageal reflux disease)   . Heart murmur    born with years ago  . Pre-diabetes    last A1C=6.0 per pt  . Torn meniscus    left knee w fracture x2 to left knee  . Wears glasses     Medications:  Medications Prior to Admission  Medication Sig Dispense Refill Last Dose  . aspirin 81 MG chewable tablet Chew 1 tablet (81 mg total) by mouth daily.     Marland Kitchen atorvastatin (LIPITOR) 40 MG tablet Take 1 tablet (40 mg total) by mouth daily at 6 PM.     . bethanechol (URECHOLINE) 5 MG tablet Take 1 tablet (5 mg total) by mouth 3 (three) times daily.     . ranitidine (ZANTAC) 150 MG tablet Take 150 mg by mouth daily as needed for heartburn.      . senna-docusate (SENOKOT-S) 8.6-50 MG tablet Take 1 tablet by mouth at bedtime as needed for mild constipation.     . sodium chloride 0.9 % infusion Inject 75 mLs into the vein continuous.  0     Assessment: 68 y/o female admitted 7/18 for right MCA stroke and given tPA. On 7/20 she had increased left sided weakness and nausea. MRI showed a large right MCA infarction and new hemorrhagic  conversion. LE venous doppler from today showed progression of acute DVT since doppler on 7/22. Pharmacy consulted to begin IV heparin for 2 days with reduced heparin level goal for recent stroke.   Heparin level (0.64) elevated this AM at 0642 after increasing drip rate to 1300 units/hr. Heparin infusion was decreased to 1200 units/hr at 08:34 this AM, and heparin level drawn ~7 hours after rate decrease was 0.15 units/mL. The patient's RN told me that the IV has not been working properly (beeping a lot when pt bends R arm), so pt has likely not received the heparin infusion as ordered, so level this afternoon is likely unreliable. RN stated that the IV has been working well for the past 2 hours, so will recheck heparin level in 4 more hours prior to making heparin dose adjustment. Per RN, no signs/symptoms of bleeding noted.  Goal of Therapy:  Heparin level 0.3-0.5 units/ml Monitor platelets by anticoagulation protocol: Yes   Plan:  Continue heparin drip to 1200 units/hr 6 hr heparin level from time that IV was working properly (~4 hrs from now) Daily heparin level and CBC Monitor for s/sx of bleeding  Gillermina Hu, PharmD, BCPS, [facility] Clinical Pharmacist 12/09/2018  4:57 PM  Please check [email or web address] for unit-specific pharmacy phone numbers.

## 2018-12-09 NOTE — Progress Notes (Addendum)
Spoke with [place], [place]. Heparin drip changes to 10.85ml/hr, no active bleeding noted upon assessment

## 2018-12-09 NOTE — Progress Notes (Signed)
Deanna [name] PHYSICAL MEDICINE & REHABILITATION PROGRESS NOTE   Subjective/Complaints:  Appreciate Neuro note , no breathing issues or LLE pain   ROS-no chest pain shortness of breath nausea vomiting diarrhea, positive constipation objective:   Ct Head Wo Contrast  Result Date: 12/08/2018 CLINICAL DATA:  Stroke follow-up EXAM: CT HEAD WITHOUT CONTRAST TECHNIQUE: Contiguous axial images were obtained from the base of the skull through the vertex without intravenous contrast. COMPARISON:  12/01/2018, 11/29/2018 FINDINGS: Brain: Redemonstration of a large territory infarct within the posterior right MCA territory. Progressive decreased attenuation within the infarcted territory. Area of hemorrhagic transformation anteriorly in the region of the right insular cortex appears slightly increased in craniocaudal extent compared to prior study (series 5, image 33). 2 mm right-to-left midline shift, similar to prior. No evidence of tonsillar herniation. No new area of acute infarction. Vascular: No hyperdense vessel or unexpected calcification. Skull: Normal. Negative for fracture or focal lesion. Sinuses/Orbits: No acute finding. Other: None. IMPRESSION: Continued evolution of large posterior right MCA territory infarction with slight interval increase in size of hemorrhagic component within the deep white matter. There is 1-2 mm right-to-left midline shift, not substantially changed compared to prior. Electronically Signed   By: Davina Poke M.D.   On: 12/08/2018 13:07   Vas [place] Lower Extremity Venous (dvt)  Result Date: 12/08/2018  Lower Venous Study Indications: Follow-up DVT. Patient in rehab.  Risk Factors: DVT Found 12/03/18 in left posterior tibial veins. Comparison Study: Prior study from 12/03/18 is available for comparison. Performing Technologist: Sharion Dove RVS  Examination Guidelines: A complete evaluation includes B-mode imaging, spectral Doppler, color Doppler, and power Doppler as needed of  all accessible portions of each vessel. Bilateral testing is considered an integral part of a complete examination. Limited examinations for reoccurring indications may be performed as noted.  +-----+---------------+---------+-----------+----------+-------+ RIGHTCompressibilityPhasicitySpontaneityPropertiesSummary +-----+---------------+---------+-----------+----------+-------+ CFV  Full           Yes      Yes                          +-----+---------------+---------+-----------+----------+-------+   +---------+---------------+---------+-----------+----------+-------+ LEFT     CompressibilityPhasicitySpontaneityPropertiesSummary +---------+---------------+---------+-----------+----------+-------+ CFV      Full           Yes      Yes                          +---------+---------------+---------+-----------+----------+-------+ SFJ      Full                                                 +---------+---------------+---------+-----------+----------+-------+ FV Prox  Full                                                 +---------+---------------+---------+-----------+----------+-------+ FV Mid   Full                                                 +---------+---------------+---------+-----------+----------+-------+ FV DistalFull                                                 +---------+---------------+---------+-----------+----------+-------+  PFV      Full                                                 +---------+---------------+---------+-----------+----------+-------+ POP      None           No       No                   Acute   +---------+---------------+---------+-----------+----------+-------+ PTV      None           No       No                   Acute   +---------+---------------+---------+-----------+----------+-------+ PERO     Full                                                  +---------+---------------+---------+-----------+----------+-------+     Summary: Right: No evidence of common femoral vein obstruction. Left: Findings consistent with acute deep vein thrombosis involving the left posterior tibial veins, and left popliteal vein. Findings suggest new clot progression as compared to previous examination.  *See table(s) above for measurements and observations. Electronically signed by Ruta Hinds MD on 12/08/2018 at 6:00:49 PM.    Final    Recent Labs    12/08/18 0932 12/09/18 0642  WBC 9.4 7.9  HGB 14.4 13.6  HCT 41.9 40.9  PLT 332 328   Recent Labs    12/08/18 0932  NA 139  K 4.2  CL 103  CO2 26  GLUCOSE 165*  BUN 11  CREATININE 0.87  CALCIUM 9.1    Intake/Output Summary (Last 24 hours) at 12/09/2018 0727 Last data filed at 12/09/2018 0107 Gross per 24 hour  Intake 947.14 ml  Output 900 ml  Net 47.14 ml     Physical Exam: Vital Signs Blood pressure 131/68, pulse 86, temperature 97.9 F (36.6 C), resp. rate 16, height 5\' 1"  (1.549 m), weight 107.2 kg, SpO2 93 %.  ENT- white plaque on tongu and buccal surface  General: No acute distress Mood and affect are appropriate Heart: Regular rate and rhythm no rubs murmurs or extra sounds Lungs: Clear to auscultation, breathing unlabored, no rales or wheezes Abdomen: Positive bowel sounds, soft nontender to palpation, nondistended Extremities: No clubbing, cyanosis, or edema, no calf pain with palpation skin: No evidence of breakdown, no evidence of rash Neurologic: Cranial nerves II through XII intact, motor strength is 5/5 in Right  deltoid, bicep, tricep, grip, hip flexor, knee extensors, ankle dorsiflexor and plantar flexor, Left 2/5 triceps, trace biceps, 2/5 Knee ext  Sensory exam normal sensation to light touch and proprioception in right and absent in left upper and lower extremities Cerebellar exam normal finger to nose to finger as well as heel to shin in right upper and lower  extremities, unable to perform on the left side due to weakness Musculoskeletal: Full range of motion in all 4 extremities. No joint swelling Left neglect noted however was able to reach over with the right hand and touch her left hand and correctly identified.   Assessment/Plan: 1. 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 and  cerebral  Edema which require 3+ hours per day of interdisciplinary therapy in a comprehensive inpatient rehab setting.  Physiatrist is providing close team supervision and 24 hour management of active medical problems listed below.  Physiatrist and rehab team continue to assess barriers to discharge/monitor patient progress toward functional and medical goals  Care Tool:  Bathing    Body parts bathed by patient: Chest, Abdomen, Left arm, Right upper leg, Left upper leg, Face   Body parts bathed by helper: Right lower leg, Left lower leg, Front perineal area, Buttocks, Right arm     Bathing assist Assist Level: 2 Helpers     Upper Body Dressing/Undressing Upper body dressing   What is the patient wearing?: Pull over shirt    Upper body assist Assist Level: Maximal Assistance - Patient 25 - 49%    Lower Body Dressing/Undressing Lower body dressing      What is the patient wearing?: Pants, Incontinence brief     Lower body assist Assist for lower body dressing: Total Assistance - Patient < 25%     Toileting Toileting    Toileting assist Assist for toileting: Total Assistance - Patient < 25%     Transfers Chair/bed transfer  Transfers assist     Chair/bed transfer assist level: Dependent - mechanical lift     Locomotion Ambulation   Ambulation assist   Ambulation activity did not occur: Safety/medical concerns          Walk 10 feet activity   Assist  Walk 10 feet activity did not occur: Safety/medical concerns        Walk 50 feet activity   Assist Walk 50 feet with 2 turns  activity did not occur: Safety/medical concerns         Walk 150 feet activity   Assist Walk 150 feet activity did not occur: Safety/medical concerns         Walk 10 feet on uneven surface  activity   Assist Walk 10 feet on uneven surfaces activity did not occur: Safety/medical concerns         Wheelchair     Assist Will patient use wheelchair at discharge?: Yes Type of Wheelchair: Manual    Wheelchair assist level: Maximal Assistance - Patient 25 - 49% Max wheelchair distance: 10'    Wheelchair 50 feet with 2 turns activity    Assist    Wheelchair 50 feet with 2 turns activity did not occur: Safety/medical concerns       Wheelchair 150 feet activity     Assist Wheelchair 150 feet activity did not occur: Safety/medical concerns        Medical Problem List and Plan: 1.Left hemiparesis, dysphagia  and functional deficitssecondary to large right MCA infarct -CIR PT, OT, SLP, continue program, neglect improving 2. Antithrombotics: -DVT/anticoagulation:Mechanical:Sequential compression devices, below kneeBilateral lower extremities Left post tibial DVT extended to L popliteal vein, now felt to be acute with propagation, per Neuro ok for Heparin IV x 2 d followed by Eliquis -antiplatelet therapy: Low dose ASA. 3.OA bilateral knees/Pain Management:tylenol prn. Resume Voltaren gel To bilateral knees.  4. Mood:LCSW to follow for evaluation and support. Patient "nightmare" may be more of a perceptual issue, having some intermittent recognition of the left upper extremity but sometimes has difficulty in recognizing whether this is her own limb or not -antipsychotic agents: N/A 5. Neuropsych: This patientis not fullycapable of making decisions on onown behalf. 6. Skin/Wound Care:Routine pressure relief measures. 7. Fluids/Electrolytes/Nutrition:Monitor I/O. Check lytes in am. 8.HTN:  Monitor BP  tid--improved control with fewer fluctuations over last 48 hours. Would not add further bp medication at this point to avoid hypoperfusion. Vitals:   12/08/18 1943 12/09/18 0423  BP: 133/62 131/68  Pulse: 94 86  Resp: 20 16  Temp: 98.2 F (36.8 C) 97.9 F (36.6 C)  SpO2: 93% 93%  controlled 7/28  9. UTI R to Macrobid , no Allergies, change to amoxil10. Prediabetes: Hgb A1c-5.9. CM diet.  11. Morbid obesity: Educate patient on CM/HH diet and importance of weight loss to help promote health and mobility.  Poor intake due to attn may lead to some gradual wt loss 12. Dyslipidemia: On Atorvastatin    LOS: 7 days A FACE TO FACE EVALUATION WAS PERFORMED  Charlett Blake 12/09/2018, 7:27 AM

## 2018-12-09 NOTE — Progress Notes (Signed)
Speech Language Pathology Daily Session Note  Patient Details  Name: Deanna [name] MRN: [id number] Date of Birth: 04-23-1951  Today's Date: 12/09/2018 SLP Individual Time: 1300-1345 SLP Individual Time Calculation (min): 45 min  Short Term Goals: Week 1: SLP Short Term Goal 1 (Week 1): Pt will sustain attention to basic task for ~ 1 minute with Mod A cues. SLP Short Term Goal 2 (Week 1): Pt will perform basic familiar problem solving tasks related to ADLs with Mod A cues. SLP Short Term Goal 3 (Week 1): Pt will initiate basic familiar problem solving tasks in 6 out of 10 opportunities with Max A cues. SLP Short Term Goal 4 (Week 1): Pt will scan to midline in 5 out of 10 opportunities with Max A cues.  Skilled Therapeutic Interventions: Skilled ST services focused on cognitive skills. SLP facilitated basic problem solving, task initiation, sustained attention and left scanning skills in two novel card tasks. Pt demonstrated ability to sort cards by 6 different shapes with various colors and number of shapes present mod I. Pt demonstrated ability to problem solving in card task (Blink) played at most basic level, pt required min A verbal cues for problem solving, supervision A verbal cues for recall of three rules and initiated task mod I. Pt dmeonstrated sustained attention to task in 30 minute interval. Pt was left in room with call bell within reach and bed alarm set. ST recommends to continue skilled ST services.   Pain Pain Assessment Pain Scale: 0-10 Pain Score: 0-No pain  Therapy/Group: Individual Therapy  [name]  [facility] 12/09/2018, 4:03 PM

## 2018-12-09 NOTE — Progress Notes (Signed)
ANTICOAGULATION CONSULT NOTE - Follow-up Pharmacy Consult for heparin Indication: acute DVT   No Known Allergies  Patient Measurements: Height: 5\' 1"  (154.9 cm) Weight: 236 lb 5.3 oz (107.2 kg) IBW/kg (Calculated) : 47.8 Heparin Dosing Weight: 74 kg  Vital Signs: Temp: 97.9 F (36.6 C) (07/28 0423) BP: 131/68 (07/28 0423) Pulse Rate: 86 (07/28 0423)  Labs: Recent Labs    12/08/18 0932 12/08/18 2106 12/09/18 0642  HGB 14.4  --  13.6  HCT 41.9  --  40.9  PLT 332  --  328  HEPARINUNFRC  --  0.10* 0.64  CREATININE 0.87  --   --     Estimated Creatinine Clearance: 69 mL/min (by C-G formula based on SCr of 0.87 mg/dL).   Medical History: Past Medical History:  Diagnosis Date  . Arthritis    osteoarthritis in  both knees  . Asthma   . Difficult intravenous access   . GERD (gastroesophageal reflux disease)   . Heart murmur    born with years ago  . Pre-diabetes    last A1C=6.0 per pt  . Torn meniscus    left knee w fracture x2 to left knee  . Wears glasses     Medications:  Medications Prior to Admission  Medication Sig Dispense Refill Last Dose  . aspirin 81 MG chewable tablet Chew 1 tablet (81 mg total) by mouth daily.     Marland Kitchen atorvastatin (LIPITOR) 40 MG tablet Take 1 tablet (40 mg total) by mouth daily at 6 PM.     . bethanechol (URECHOLINE) 5 MG tablet Take 1 tablet (5 mg total) by mouth 3 (three) times daily.     . ranitidine (ZANTAC) 150 MG tablet Take 150 mg by mouth daily as needed for heartburn.      . senna-docusate (SENOKOT-S) 8.6-50 MG tablet Take 1 tablet by mouth at bedtime as needed for mild constipation.     . sodium chloride 0.9 % infusion Inject 75 mLs into the vein continuous.  0     Assessment: 68 y/o female admitted 7/18 for right MCA stroke and given tPA. On 7/20 she had increased left sided weakness and nausea. MRI showed a large right MCA infarction and new hemorrhagic conversion. LE venous doppler from today showed progression of acute  DVT since doppler on 7/22. Pharmacy consulted to begin IV heparin for 2 days with reduced heparin level goal for recent stroke.   Heparin level (0.64) elevated after increasing drip rate to 1300 units/hr. CBC WNL. No bleeding noted.   Goal of Therapy:  Heparin level 0.3-0.5 units/ml Monitor platelets by anticoagulation protocol: Yes   Plan:  Decrease heparin drip to 1200 units/hr 6 hr heparin level Daily heparin level and CBC Monitor for s/sx of bleeding Plan to transition to apixaban 5 mg PO bid if no neuro changes   Richardine Service, PharmD PGY1 Pharmacy Resident Phone: [phone] 12/09/2018  8:21 AM  Please check [email or web address] for unit-specific pharmacy phone numbers.

## 2018-12-10 ENCOUNTER — Inpatient Hospital Stay (HOSPITAL_COMMUNITY): Payer: Medicare Other | Admitting: Occupational Therapy

## 2018-12-10 ENCOUNTER — Inpatient Hospital Stay (HOSPITAL_COMMUNITY): Payer: Medicare Other | Admitting: Speech Pathology

## 2018-12-10 ENCOUNTER — Inpatient Hospital Stay (HOSPITAL_COMMUNITY): Payer: Medicare Other

## 2018-12-10 LAB — CBC
HCT: 39.9 % (ref 36.0–46.0)
Hemoglobin: 13.7 g/dL (ref 12.0–15.0)
MCH: 33.5 pg (ref 26.0–34.0)
MCHC: 34.3 g/dL (ref 30.0–36.0)
MCV: 97.6 fL (ref 80.0–100.0)
Platelets: 337 10*3/uL (ref 150–400)
RBC: 4.09 MIL/uL (ref 3.87–5.11)
RDW: 12.5 % (ref 11.5–15.5)
WBC: 8.2 10*3/uL (ref 4.0–10.5)
nRBC: 0 % (ref 0.0–0.2)

## 2018-12-10 LAB — GLUCOSE, CAPILLARY
Glucose-Capillary: 124 mg/dL — ABNORMAL HIGH (ref 70–99)
Glucose-Capillary: 119 mg/dL — ABNORMAL HIGH (ref 70–99)
Glucose-Capillary: 122 mg/dL — ABNORMAL HIGH (ref 70–99)
Glucose-Capillary: 125 mg/dL — ABNORMAL HIGH (ref 70–99)

## 2018-12-10 LAB — HEPARIN LEVEL (UNFRACTIONATED): Heparin Unfractionated: 1.09 IU/mL — ABNORMAL HIGH (ref 0.30–0.70)

## 2018-12-10 MED ORDER — HEPARIN (PORCINE) 25000 UT/250ML-% IV SOLN: 850.0000 [IU]/h | INTRAVENOUS | Status: DC

## 2018-12-10 MED ORDER — TAMSULOSIN HCL 0.4 MG PO CAPS
0.4000 mg | ORAL_CAPSULE | Freq: Every day | ORAL | Status: DC
  Administered 2018-12-10 – 2018-12-23 (×14): 0.4 mg via ORAL
  Filled 2018-12-10 (×15): qty 1

## 2018-12-10 MED ORDER — APIXABAN 5 MG PO TABS
5.0000 mg | ORAL_TABLET | Freq: Two times a day (BID) | ORAL | Status: DC
  Administered 2018-12-10 – 2018-12-26 (×33): 5 mg via ORAL
  Filled 2018-12-10 (×33): qty 1

## 2018-12-10 NOTE — Progress Notes (Signed)
Occupational Therapy Session Note  Patient Details  Name: Deanna [name] MRN: [id number] Date of Birth: 10/28/1950  Today's Date: 12/10/2018 OT Individual Time: 4098-1191 OT Individual Time Calculation (min): 75 min    Short Term Goals: Week 1:  OT Short Term Goal 1 (Week 1): Pt will maintain unsupported sitting EOC or bed with supervision during UB bathing. OT Short Term Goal 2 (Week 1): Pt will complete UB dressing with mod assist to donn pullover only for 3 consecutive sessions. OT Short Term Goal 3 (Week 1): Pt will complete LB dressing with max assist sit to stand following hemi techniques for donning brief and pants. OT Short Term Goal 4 (Week 1): Pt will complete toilet transfer to the drop arm commode with mod assist for squat pivot. OT Short Term Goal 5 (Week 1): Pt will locate all grooming and bathing items left of midline with no more than mod instructional cueing.  Skilled Therapeutic Interventions/Progress Updates:      Pt seen for BADL retraining of toileting, bathing, and dressing with a focus on L side awareness, balance, hemidressing techniques, and hand over hand guidance with use of LUE to wash R arm and L thigh.   See ADL documentation below for details.   Pt participated well and did well scanning to her L to find items. Removed R visual field taping from her glasses to see if it makes a difference. Pt felt she could see more clearly.  She can now read the clock on the wall and tries to actively use her L arm.  Pt did well using a forward lean to rise to stand with min A.  She pushes up using R hand on bed and then transfers to RW.  She did have occasional L knee hyperextension but could hold static stand with min A and then mod A when she let go with her R hand to pull pants over hips with mod A. Pt continues to struggle with motor planning and gets confused with L/R sides but it seems she is having difficulty with these skills mostly due to some anxiety and difficulty  focusing without talking about other things. She does have more active movement in her L arm but also increased tone. Pt resting in w/c with lap tray in place, safety belt on, call lights in reach.    Therapy Documentation Precautions:  Precautions Precautions: Fall Precaution Comments: left neglect Restrictions Weight Bearing Restrictions: No     Pain: Pain Assessment Pain Score: 0-No pain ADL: ADL Eating: Minimal assistance Where Assessed-Eating: Bed level Grooming: Supervision/safety Where Assessed-Grooming: Sitting at sink Upper Body Bathing: Moderate assistance Where Assessed-Upper Body Bathing: Edge of bed Lower Body Bathing: Maximal assistance Where Assessed-Lower Body Bathing: Edge of bed Upper Body Dressing: Moderate assistance Where Assessed-Upper Body Dressing: Edge of bed Lower Body Dressing: Maximal assistance Where Assessed-Lower Body Dressing: Other (Comment)(toilet) Toileting: Maximal assistance Where Assessed-Toileting: Glass blower/designer: Maximal Print production planner Method: Engineer, water: Grab bars    Therapy/Group: Individual Therapy  [place] 12/10/2018, 10:25 AM

## 2018-12-10 NOTE — Progress Notes (Signed)
Spoke with Legrand Como, PH, Heparin to be held x 1 Hour

## 2018-12-10 NOTE — Progress Notes (Signed)
Physical Therapy Weekly Progress Note  Patient Details  Name: Deanna [name] MRN: [id number] Date of Birth: 05/28/1950  Beginning of progress report period: December 03, 2018 End of progress report period: December 10, 2018  Today's Date: 12/10/2018 PT Individual Time: 1400-1445 PT Individual Time Calculation (min): 45 min   Patient has met 3 of 3 short term goals. Pt is progressing with all functional mobility, she can perform sit<>stands with min assist however her transfers and gait is limited secondary to L inattention and impaired L sided proprioception.   Patient continues to demonstrate the following deficits muscle weakness, impaired timing and sequencing and decreased coordination, decreased attention and decreased awareness and decreased standing balance, decreased postural control, hemiplegia and decreased balance strategies and therefore will continue to benefit from skilled PT intervention to increase functional independence with mobility.  Patient progressing toward long term goals..  Continue plan of care.  PT Short Term Goals Week 1:  PT Short Term Goal 1 (Week 1): Pt will complete least restrictive transfer with max A consistently PT Short Term Goal 1 - Progress (Week 1): Met PT Short Term Goal 2 (Week 1): Pt will initiate gait training PT Short Term Goal 2 - Progress (Week 1): Met PT Short Term Goal 3 (Week 1): Pt will perform bed mobility with mod A consistently PT Short Term Goal 3 - Progress (Week 1): Met Week 2:  PT Short Term Goal 1 (Week 2): Pt will ambulate x 30 ft with LRAD and mod assist PT Short Term Goal 2 (Week 2): Pt will perform sit<>stand with min assist and RW PT Short Term Goal 3 (Week 2): Pt will perform bed<>chair transfers with mod assist  Skilled Therapeutic Interventions/Progress Updates:    Pt seated in w/c upon PT arrival, agreeable to therapy tx and denies pain at rest. Pt transported to the gym. Pt ambulated x 10 ft this session using R rail and max  assist with therapist blocking L knee during stance and assisting with L LE foot placement following swing (pt adducts L LE). Pt worked on standing balance and pre-gait this session with RW and mirror for visual feedback. Pt performed sit<>stands x6 throughout session with RW and min assist. In standing pt worked on L LE stance control while stepping in place with R LE, mirror for feedback, cues for upright posture and occasional L knee recurvatum noted. Pt worked on stepping in place with L LE using RW for UE support, mirror for visual feedback and cues for small step/wider BOS. Therapist provided pt with better fitting w/c this session to promote comfort, positioning and minimize back pain. Pt reports having to use the bathroom. Transported back to room. Stedy transfer this session w/c<>toilet, dependent with therapist assisting with clothing management and pericare. Pt left in w/c at end of session with needs in reach and chair alarm set.   Therapy Documentation Precautions:  Precautions Precautions: Fall Precaution Comments: left neglect Restrictions Weight Bearing Restrictions: No   Therapy/Group: Individual Therapy  Netta Corrigan, PT, DPT 12/10/2018, 7:56 AM

## 2018-12-10 NOTE — Progress Notes (Signed)
Daughter updated on current progress/goas/ELOS. Family is very supportive and they all plan to help assist after discharge.

## 2018-12-10 NOTE — Plan of Care (Signed)
  Problem: RH Problem Solving Goal: LTG Patient will demonstrate problem solving for (SLP) Description: LTG:  Patient will demonstrate problem solving for basic/complex daily situations with cues  (SLP) Flowsheets (Taken 12/10/2018 1123) LTG: Patient will demonstrate problem solving for (SLP): (semi-complex) -- LTG Patient will demonstrate problem solving for: (upgraded due to progress) Minimal Assistance - Patient > 75% Note: Upgraded due to progress

## 2018-12-10 NOTE — Progress Notes (Signed)
ANTICOAGULATION CONSULT NOTE - Follow-up Pharmacy Consult for heparin Indication: acute DVT   No Known Allergies  Patient Measurements: Height: 5\' 1"  (154.9 cm) Weight: 236 lb 5.3 oz (107.2 kg) IBW/kg (Calculated) : 47.8 Heparin Dosing Weight: 74 kg  Vital Signs: Temp: 98.3 F (36.8 C) (07/29 0417) Temp Source: Oral (07/29 0417) BP: 136/64 (07/29 0417) Pulse Rate: 84 (07/29 0417)  Labs: Recent Labs    12/08/18 0932  12/09/18 0175 12/09/18 1518 12/09/18 2125 12/10/18 0506  HGB 14.4  --  13.6  --   --  13.7  HCT 41.9  --  40.9  --   --  39.9  PLT 332  --  328  --   --  337  HEPARINUNFRC  --    < > 0.64 0.15* 0.67 1.09*  CREATININE 0.87  --   --   --   --   --    < > = values in this interval not displayed.    Estimated Creatinine Clearance: 69 mL/min (by C-G formula based on SCr of 0.87 mg/dL).   Medical History: Past Medical History:  Diagnosis Date  . Arthritis    osteoarthritis in  both knees  . Asthma   . Difficult intravenous access   . GERD (gastroesophageal reflux disease)   . Heart murmur    born with years ago  . Pre-diabetes    last A1C=6.0 per pt  . Torn meniscus    left knee w fracture x2 to left knee  . Wears glasses     Medications:  Medications Prior to Admission  Medication Sig Dispense Refill Last Dose  . aspirin 81 MG chewable tablet Chew 1 tablet (81 mg total) by mouth daily.     Marland Kitchen atorvastatin (LIPITOR) 40 MG tablet Take 1 tablet (40 mg total) by mouth daily at 6 PM.     . bethanechol (URECHOLINE) 5 MG tablet Take 1 tablet (5 mg total) by mouth 3 (three) times daily.     . ranitidine (ZANTAC) 150 MG tablet Take 150 mg by mouth daily as needed for heartburn.      . senna-docusate (SENOKOT-S) 8.6-50 MG tablet Take 1 tablet by mouth at bedtime as needed for mild constipation.     . sodium chloride 0.9 % infusion Inject 75 mLs into the vein continuous.  0     Assessment: 68 y/o female admitted 7/18 for right MCA stroke and given tPA. On  7/20 she had increased left sided weakness and nausea. MRI showed a large right MCA infarction and new hemorrhagic conversion. LE venous doppler from today showed progression of acute DVT since doppler on 7/22. Pharmacy consulted to begin IV heparin for 2 days with reduced heparin level goal for recent stroke.   Heparin levels have been labile. This morning, heparin level this morning elevated at 1.09, CBC stable and wnl. Drawn appropriately per RN report, no infusion issues overnight, no S/Sx bleeding.   Goal of Therapy:  Heparin level 0.3-0.5 units/ml Monitor platelets by anticoagulation protocol: Yes   Plan:  Hold heparin x1 hr Restart at 0700 at 850 units/hr Recheck 6hr heparin level   Deanna [name], PharmD, BCPS Clinical Pharmacist Please check AMION for all [place] numbers 12/10/2018

## 2018-12-10 NOTE — Progress Notes (Signed)
Speech Language Pathology Weekly Progress and Session Note  Patient Details  Name: Deanna [name] MRN: [id number] Date of Birth: 12/02/1950  Beginning of progress report period: December 03, 2018 End of progress report period: December 10, 2018  Today's Date: 12/10/2018 SLP Individual Time: 1005-1100 SLP Individual Time Calculation (min): 55 min  Short Term Goals: Week 1: SLP Short Term Goal 1 (Week 1): Pt will sustain attention to basic task for ~ 1 minute with Mod A cues. SLP Short Term Goal 1 - Progress (Week 1): Met SLP Short Term Goal 2 (Week 1): Pt will perform basic familiar problem solving tasks related to ADLs with Mod A cues. SLP Short Term Goal 2 - Progress (Week 1): Met SLP Short Term Goal 3 (Week 1): Pt will initiate basic familiar problem solving tasks in 6 out of 10 opportunities with Max A cues. SLP Short Term Goal 3 - Progress (Week 1): Met SLP Short Term Goal 4 (Week 1): Pt will scan to midline in 5 out of 10 opportunities with Max A cues. SLP Short Term Goal 4 - Progress (Week 1): Met    New Short Term Goals: Week 2: SLP Short Term Goal 1 (Week 2): Pt will sustain attention to basic task for ~ 45 minute with Min A cues. SLP Short Term Goal 2 (Week 2): Pt will perform semi-complex familiar problem solving tasks related to ADLs with Mod A cues. SLP Short Term Goal 3 (Week 2): Pt will initiate basic familiar problem solving tasks in 8 out of 10 opportunities with Mod A cues. SLP Short Term Goal 4 (Week 2): Pt will scan to midline in 8 out of 10 opportunities with Min A cues.  Weekly Progress Updates: Pt has made good progress this week, meeting 4 out of 4 goals.   Intensity: Minumum of 1-2 x/day, 30 to 90 minutes Frequency: 3 to 5 out of 7 days Duration/Length of Stay: 3 to 3.5 weeks Treatment/Interventions: Cognitive remediation/compensation;Therapeutic Activities;Patient/family education;Functional tasks   Daily Session  Skilled Therapeutic Interventions: Pt pleasant  and cooperative with unfamiliar therapist. Pt was seen for skilled ST intervention targeting aforementioned goals. Pt exhibited good recall of therapeutic activity completed yesterday, and recalled 5/5 words from [facility]. Pt was able to sustain attention to task for over 20 minutes in quiet environment, completing problem solving task accurately with Blink cards. Min-mod verbal cues required for pt to attend to left most card in front of her. Pt reported she would do much better if she were in her home environment. Pt stated she would be able to get out of her own bed without assistance. SLP engaged pt in discussion regarding her abilities with transfers to increase awareness of level of impairment and significant need for supervision and assistance. When verbalizing steps to go to the bathroom, mod-max verbal and visual cues were required for attention to detail - locking brakes, moving arm rest and foot plates out of the way, proper attention to left upper extremity. SLP encouraged pt to call before getting up from bed or chair, and reiterated goals of rehab to increase safety and independence before entertaining the appropriateness of returning home.  General    Pain Pain Assessment Pain Scale: 0-10 Pain Score: 0-No pain  Therapy/Group: Individual Therapy   Deanna [name] B. Quentin Ore, [facility], CCC-SLP Speech Language Pathologist  Shonna Chock 12/10/2018, 12:30 PM

## 2018-12-10 NOTE — Progress Notes (Signed)
Occupational Therapy Session Note  Patient Details  Name: Deanna [name] MRN: [id number] Date of Birth: [birth year]  Today's Date: 12/10/2018 OT Individual Time: 1130-1200 OT Individual Time Calculation (min): 30 min    Short Term Goals: Week 1:  OT Short Term Goal 1 (Week 1): Pt will maintain unsupported sitting EOC or bed with supervision during UB bathing. OT Short Term Goal 2 (Week 1): Pt will complete UB dressing with mod assist to donn pullover only for 3 consecutive sessions. OT Short Term Goal 3 (Week 1): Pt will complete LB dressing with max assist sit to stand following hemi techniques for donning brief and pants. OT Short Term Goal 4 (Week 1): Pt will complete toilet transfer to the drop arm commode with mod assist for squat pivot. OT Short Term Goal 5 (Week 1): Pt will locate all grooming and bathing items left of midline with no more than mod instructional cueing.  Skilled Therapeutic Interventions/Progress Updates:    Pt worked on Research scientist (life sciences), left visual attention, and standing balance during session.  She was able to stand at the high/low table with RUE support and min assist while engaged in reaching activity and washing the table with a washcloth using the LUE.  She needed mod instructional cueing to maintain visual attention on the left hand and maintain digit extension actively while washing the table.  When her attention would leave, her fingers would flex.  She was able to work on scanning and picking up 1" blocks with the RUE with min facilitation from therapist.  She was able to scan and correctly identify the correct number of blocks left on the table as well.  Finished session with three muskateers technique for ambulating approximately 20' with total assist +2 (pt 35%).  She demonstrated increased adduction of the LLE and decreased overall strength in stance phase.  Finished session with transfer back to the room with call button and phone in reach and  safety belt in place.    Therapy Documentation Precautions:  Precautions Precautions: Fall Precaution Comments: left neglect Restrictions Weight Bearing Restrictions: No   Pain: Pain Assessment Pain Scale: Faces Pain Score: 0-No pain ADL: See Care Tool Section for some details of ADL  Therapy/Group: Individual Therapy  Deanna [name] OTR/L 12/10/2018, 12:08 PM

## 2018-12-10 NOTE — Discharge Instructions (Addendum)
Inpatient Rehab Discharge Instructions  Deanna [name] Discharge date and time: 12/26/18   Activities/Precautions/ Functional Status: Activity: no lifting, driving, or strenuous exercise till cleared by MD Diet: cardiac diet and diabetic diet Wound Care: keep wound clean and dry   Functional status:  ___ No restrictions     ___ Walk up steps independently _X__ 24/7 supervision/assistance   ___ Walk up steps with assistance ___ Intermittent supervision/assistance  ___ Bathe/dress independently ___ Walk with walker     _X__ Bathe/dress with assistance ___ Walk Independently    ___ Shower independently _X__ Walk with assistance    ___ Shower with assistance _X__ No alcohol     ___ Return to work/school ________   Special Instructions: 1. Monitor blood sugars before meals and at bedtime.     COMMUNITY REFERRALS UPON DISCHARGE:   Home Health:   PT     OT      ST      SNA     Agency:  [facility] Phone:  [phone] Medical Equipment/Items Ordered:  20"x18" lightweight wheelchair with basic cushion; wide rolling walker; drop arm commode; tub transfer bench  Agency/Supplier:  [facility]        Phone:  [phone]  GENERAL COMMUNITY RESOURCES FOR PATIENT/FAMILY: Support Groups:  [facility] Stroke Support Group - on HOLD for COVID-19                              Meets the second Thursday of each month from 6 - 7 PM (except June, July, August)                              [facility], [address], [place]                               For more information, call [phone]  STROKE/TIA DISCHARGE INSTRUCTIONS SMOKING Cigarette smoking nearly doubles your risk of having a stroke & is the single most alterable risk factor  If you smoke or have smoked in the last 12 months, you are advised to quit smoking for your health.  Most of the excess cardiovascular risk related to smoking disappears within a year of  stopping.  Ask you doctor about anti-smoking medications  [place] Quit Line: 1-800-QUIT NOW  Free Smoking Cessation Classes [phone]  CHOLESTEROL Know your levels; limit fat & cholesterol in your diet  Lipid Panel     Component Value Date/Time   CHOL 173 11/30/2018 0736   TRIG 83 11/30/2018 0736   HDL 53 11/30/2018 0736   CHOLHDL 3.3 11/30/2018 0736   VLDL 17 11/30/2018 0736   LDLCALC 103 (H) 11/30/2018 0736      Many patients benefit from treatment even if their cholesterol is at goal.  Goal: Total Cholesterol (CHOL) less than 160  Goal:  Triglycerides (TRIG) less than 150  Goal:  HDL greater than 40  Goal:  LDL (LDLCALC) less than 100   BLOOD PRESSURE American Stroke Association blood pressure target is less that 120/80 mm/Hg  Your discharge blood pressure is:  BP: (!) 174/86  Monitor your blood pressure  Limit your salt and alcohol intake  Many individuals will require more than one medication for high blood pressure  DIABETES (A1c is a blood sugar  average for last 3 months) Goal HGBA1c is under 7% (HBGA1c is blood sugar average for last 3 months)  Diabetes:     Lab Results  Component Value Date   HGBA1C 5.9 (H) 11/30/2018     Your HGBA1c can be lowered with medications, healthy diet, and exercise.  Check your blood sugar as directed by your physician  Call your physician if you experience unexplained or low blood sugars.  PHYSICAL ACTIVITY/REHABILITATION Goal is 30 minutes at least 4 days per week  Activity: No driving, Therapies: see above Return to work: N/A  Activity decreases your risk of heart attack and stroke and makes your heart stronger.  It helps control your weight and blood pressure; helps you relax and can improve your mood.  Participate in a regular exercise program.  Talk with your doctor about the best form of exercise for you (dancing, walking, swimming, cycling).  DIET/WEIGHT Goal is to maintain a healthy weight  Your discharge diet  is:  Diet Order            Diet heart healthy/carb modified Room service appropriate? Yes; Fluid consistency: Thin  Diet effective now             liquids Your height is:  Height: 5\' 1"  (154.9 cm) Your current weight is: Weight: 214 lbs Your Body Mass Index (BMI) is:  BMI (Calculated): 44.68  Following the type of diet specifically designed for you will help prevent another stroke.  Your goal weight  is: 132 lbs  Your goal Body Mass Index (BMI) is 19-24.  Healthy food habits can help reduce 3 risk factors for stroke:  High cholesterol, hypertension, and excess weight.  RESOURCES Stroke/Support Group:  Call [phone]   STROKE EDUCATION PROVIDED/REVIEWED AND GIVEN TO PATIENT Stroke warning signs and symptoms How to activate emergency medical system (call 911). Medications prescribed at discharge. Need for follow-up after discharge. Personal risk factors for stroke. Pneumonia vaccine given:  Flu vaccine given:  My questions have been answered, the writing is legible, and I understand these instructions.  I will adhere to these goals & educational materials that have been provided to me after my discharge from the hospital.     My questions have been answered and I understand these instructions. I will adhere to these goals and the provided educational materials after my discharge from the hospital.  Patient/Caregiver Signature _______________________________ Date __________  Clinician Signature _______________________________________ Date __________  Please bring this form and your medication list with you to all your follow-up doctor's appointments.   Information on my medicine - ELIQUIS (apixaban)  Why was Eliquis prescribed for you? Eliquis was prescribed to treat blood clots that may have been found in the veins of your legs (deep vein thrombosis) or in your lungs (pulmonary embolism) and to reduce the risk of them occurring again.  What do You need to know about  Eliquis ? The starting dose is 5 mg (one tablet) taken TWICE daily.  Eliquis may be taken with or without food.   Try to take the dose about the same time in the morning and in the evening. If you have difficulty swallowing the tablet whole please discuss with your pharmacist how to take the medication safely.  Take Eliquis exactly as prescribed and DO NOT stop taking Eliquis without talking to the doctor who prescribed the medication.  Stopping may increase your risk of developing a new blood clot.  Refill your prescription before you run out.  After discharge, you  should have regular check-up appointments with your healthcare provider that is prescribing your Eliquis.    What do you do if you miss a dose? If a dose of ELIQUIS is not taken at the scheduled time, take it as soon as possible on the same day and twice-daily administration should be resumed. The dose should not be doubled to make up for a missed dose.  Important Safety Information A possible side effect of Eliquis is bleeding. You should call your healthcare provider right away if you experience any of the following: ? Bleeding from an injury or your nose that does not stop. ? Unusual colored urine (red or dark brown) or unusual colored stools (red or black). ? Unusual bruising for unknown reasons. ? A serious fall or if you hit your head (even if there is no bleeding).  Some medicines may interact with Eliquis and might increase your risk of bleeding or clotting while on Eliquis. To help avoid this, consult your healthcare provider or pharmacist prior to using any new prescription or non-prescription medications, including herbals, vitamins, non-steroidal anti-inflammatory drugs (NSAIDs) and supplements.  This website has more information on Eliquis (apixaban): http://[email or web address].com/eliquis/home

## 2018-12-10 NOTE — Progress Notes (Signed)
[facility] PHYSICAL MEDICINE & REHABILITATION PROGRESS NOTE   Subjective/Complaints:  No issues overnite   ROS-no chest pain shortness of breath nausea vomiting diarrhea, positive constipation objective:   Ct Head Wo Contrast  Result Date: 12/08/2018 CLINICAL DATA:  Stroke follow-up EXAM: CT HEAD WITHOUT CONTRAST TECHNIQUE: Contiguous axial images were obtained from the base of the skull through the vertex without intravenous contrast. COMPARISON:  12/01/2018, 11/29/2018 FINDINGS: Brain: Redemonstration of a large territory infarct within the posterior right MCA territory. Progressive decreased attenuation within the infarcted territory. Area of hemorrhagic transformation anteriorly in the region of the right insular cortex appears slightly increased in craniocaudal extent compared to prior study (series 5, image 33). 2 mm right-to-left midline shift, similar to prior. No evidence of tonsillar herniation. No new area of acute infarction. Vascular: No hyperdense vessel or unexpected calcification. Skull: Normal. Negative for fracture or focal lesion. Sinuses/Orbits: No acute finding. Other: None. IMPRESSION: Continued evolution of large posterior right MCA territory infarction with slight interval increase in size of hemorrhagic component within the deep white matter. There is 1-2 mm right-to-left midline shift, not substantially changed compared to prior. Electronically Signed   By: Davina Poke M.D.   On: 12/08/2018 13:07   Vas [place] Lower Extremity Venous (dvt)  Result Date: 12/08/2018  Lower Venous Study Indications: Follow-up DVT. Patient in rehab.  Risk Factors: DVT Found 12/03/18 in left posterior tibial veins. Comparison Study: Prior study from 12/03/18 is available for comparison. Performing Technologist: Sharion Dove RVS  Examination Guidelines: A complete evaluation includes B-mode imaging, spectral Doppler, color Doppler, and power Doppler as needed of all accessible portions of each  vessel. Bilateral testing is considered an integral part of a complete examination. Limited examinations for reoccurring indications may be performed as noted.  +-----+---------------+---------+-----------+----------+-------+ RIGHTCompressibilityPhasicitySpontaneityPropertiesSummary +-----+---------------+---------+-----------+----------+-------+ CFV  Full           Yes      Yes                          +-----+---------------+---------+-----------+----------+-------+   +---------+---------------+---------+-----------+----------+-------+ LEFT     CompressibilityPhasicitySpontaneityPropertiesSummary +---------+---------------+---------+-----------+----------+-------+ CFV      Full           Yes      Yes                          +---------+---------------+---------+-----------+----------+-------+ SFJ      Full                                                 +---------+---------------+---------+-----------+----------+-------+ FV Prox  Full                                                 +---------+---------------+---------+-----------+----------+-------+ FV Mid   Full                                                 +---------+---------------+---------+-----------+----------+-------+ FV DistalFull                                                 +---------+---------------+---------+-----------+----------+-------+  PFV      Full                                                 +---------+---------------+---------+-----------+----------+-------+ POP      None           No       No                   Acute   +---------+---------------+---------+-----------+----------+-------+ PTV      None           No       No                   Acute   +---------+---------------+---------+-----------+----------+-------+ PERO     Full                                                 +---------+---------------+---------+-----------+----------+-------+     Summary:  Right: No evidence of common femoral vein obstruction. Left: Findings consistent with acute deep vein thrombosis involving the left posterior tibial veins, and left popliteal vein. Findings suggest new clot progression as compared to previous examination.  *See table(s) above for measurements and observations. Electronically signed by Ruta Hinds MD on 12/08/2018 at 6:00:49 PM.    Final    Recent Labs    12/09/18 0642 12/10/18 0506  WBC 7.9 8.2  HGB 13.6 13.7  HCT 40.9 39.9  PLT 328 337   Recent Labs    12/08/18 0932  NA 139  K 4.2  CL 103  CO2 26  GLUCOSE 165*  BUN 11  CREATININE 0.87  CALCIUM 9.1    Intake/Output Summary (Last 24 hours) at 12/10/2018 0749 Last data filed at 12/10/2018 0421 Gross per 24 hour  Intake 960 ml  Output 1450 ml  Net -490 ml     Physical Exam: Vital Signs Blood pressure 136/64, pulse 84, temperature 98.3 F (36.8 C), temperature source Oral, resp. rate 18, height 5' 1" (1.549 m), weight 107.2 kg, SpO2 96 %.  ENT- white plaque on tongu and buccal surface  General: No acute distress Mood and affect are appropriate Heart: Regular rate and rhythm no rubs murmurs or extra sounds Lungs: Clear to auscultation, breathing unlabored, no rales or wheezes Abdomen: Positive bowel sounds, soft nontender to palpation, nondistended Extremities: No clubbing, cyanosis, or edema, no calf pain with palpation skin: No evidence of breakdown, no evidence of rash Neurologic: Cranial nerves II through XII intact, motor strength is 5/5 in Right  deltoid, bicep, tricep, grip, hip flexor, knee extensors, ankle dorsiflexor and plantar flexor, Left 2/5 triceps, trace biceps, 2/5 Knee ext  Sensory exam normal sensation to light touch and proprioception in right and absent in left upper and lower extremities Cerebellar exam normal finger to nose to finger as well as heel to shin in right upper and lower extremities, unable to perform on the left side due to  weakness Musculoskeletal: Full range of motion in all 4 extremities. No joint swelling Left neglect noted however was able to reach over with the right hand and touch her left hand and correctly identified.   Assessment/Plan: 1. 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  Physiatrist is providing close team supervision and 24 hour management of active medical problems listed below.  Physiatrist and rehab team continue to assess barriers to discharge/monitor patient progress toward functional and medical goals  Care Tool:  Bathing    Body parts bathed by patient: Chest, Abdomen, Left arm, Right upper leg, Left upper leg, Face   Body parts bathed by helper: Right lower leg, Left lower leg, Front perineal area, Buttocks, Right arm     Bathing assist Assist Level: 2 Helpers     Upper Body Dressing/Undressing Upper body dressing   What is the patient wearing?: Pull over shirt    Upper body assist Assist Level: Maximal Assistance - Patient 25 - 49%    Lower Body Dressing/Undressing Lower body dressing      What is the patient wearing?: Pants, Incontinence brief     Lower body assist Assist for lower body dressing: Total Assistance - Patient < 25%     Toileting Toileting    Toileting assist Assist for toileting: Total Assistance - Patient < 25%     Transfers Chair/bed transfer  Transfers assist     Chair/bed transfer assist level: Dependent - mechanical lift     Locomotion Ambulation   Ambulation assist   Ambulation activity did not occur: Safety/medical concerns          Walk 10 feet activity   Assist  Walk 10 feet activity did not occur: Safety/medical concerns        Walk 50 feet activity   Assist Walk 50 feet with 2 turns activity did not occur: Safety/medical concerns          Walk 150 feet activity   Assist Walk 150 feet activity did not occur: Safety/medical concerns         Walk 10 feet on uneven surface  activity   Assist Walk 10 feet on uneven surfaces activity did not occur: Safety/medical concerns         Wheelchair     Assist Will patient use wheelchair at discharge?: Yes Type of Wheelchair: Manual    Wheelchair assist level: Maximal Assistance - Patient 25 - 49% Max wheelchair distance: 10'    Wheelchair 50 feet with 2 turns activity    Assist    Wheelchair 50 feet with 2 turns activity did not occur: Safety/medical concerns       Wheelchair 150 feet activity     Assist Wheelchair 150 feet activity did not occur: Safety/medical concerns        Medical Problem List and Plan: 1.Left hemiparesis, dysphagia  and functional deficitssecondary to large right MCA infarct Team conference today please see physician documentation under team conference tab, met with team face-to-face to discuss problems,progress, and goals. Formulized individual treatment plan based on medical history, underlying problem and comorbidities. 2. Antithrombotics: -DVT/anticoagulation:Mechanical:Sequential compression devices, below kneeBilateral lower extremities Left post tibial DVT extended to L popliteal vein, now felt to be acute with propagation, D/C Heparin IV start Eliquis 75m BID -antiplatelet therapy: Low dose ASA. 3.OA bilateral knees/Pain Management:tylenol prn. Resume Voltaren gel To bilateral knees.  4. Mood:LCSW to follow for evaluation and support. Patient "nightmare" may be more of a perceptual issue, having some intermittent recognition of the left upper extremity but sometimes has difficulty in recognizing whether this is her own limb or not -antipsychotic agents: N/A 5. Neuropsych: This patientis  not fullycapable of making decisions on onown behalf. 6. Skin/Wound Care:Routine  pressure relief measures. 7. Fluids/Electrolytes/Nutrition:Monitor I/O. Check lytes in am. 8.HTN: Monitor BP tid--improved control with fewer fluctuations over last 48 hours. Would not add further bp medication at this point to avoid hypoperfusion. Vitals:   12/09/18 2036 12/10/18 0417  BP: (!) 151/68 136/64  Pulse: 81 84  Resp: 16 18  Temp: 98.7 F (37.1 C) 98.3 F (36.8 C)  SpO2: 97% 96%  controlled 7/29 9. UTI R to Macrobid , no Allergies, change to amoxil10. Prediabetes: Hgb A1c-5.9. CM diet.  11. Morbid obesity: Educate patient on CM/HH diet and importance of weight loss to help promote health and mobility.  Poor intake due to attn may lead to some gradual wt loss 12. Dyslipidemia: On Atorvastatin    LOS: 8 days A FACE TO FACE EVALUATION WAS PERFORMED  Charlett Blake 12/10/2018, 7:49 AM

## 2018-12-11 ENCOUNTER — Inpatient Hospital Stay (HOSPITAL_COMMUNITY): Payer: Medicare Other | Admitting: Occupational Therapy

## 2018-12-11 ENCOUNTER — Inpatient Hospital Stay (HOSPITAL_COMMUNITY): Payer: Medicare Other | Admitting: Speech Pathology

## 2018-12-11 ENCOUNTER — Inpatient Hospital Stay (HOSPITAL_COMMUNITY): Payer: Medicare Other | Admitting: Physical Therapy

## 2018-12-11 LAB — GLUCOSE, CAPILLARY
Glucose-Capillary: 100 mg/dL — ABNORMAL HIGH (ref 70–99)
Glucose-Capillary: 119 mg/dL — ABNORMAL HIGH (ref 70–99)
Glucose-Capillary: 122 mg/dL — ABNORMAL HIGH (ref 70–99)
Glucose-Capillary: 141 mg/dL — ABNORMAL HIGH (ref 70–99)

## 2018-12-11 MED ORDER — PHENAZOPYRIDINE HCL 100 MG PO TABS
100.0000 mg | ORAL_TABLET | Freq: Three times a day (TID) | ORAL | Status: AC
  Administered 2018-12-11 – 2018-12-13 (×6): 100 mg via ORAL
  Filled 2018-12-11 (×6): qty 1

## 2018-12-11 NOTE — Plan of Care (Signed)
  Problem: RH BOWEL ELIMINATION Goal: RH STG MANAGE BOWEL WITH ASSISTANCE Description: STG Manage Bowel with mod Assistance. Outcome: Not Progressing; LBM 7/26 miralax given ; prune juice   Problem: RH BLADDER ELIMINATION Goal: RH STG MANAGE BLADDER WITH ASSISTANCE Description: STG Manage Bladder With mod Assistance Outcome: Not Progressing; bladder scan  q 6

## 2018-12-11 NOTE — Progress Notes (Signed)
Occupational Therapy Weekly Progress Note  Patient Details  Name: Deanna [name] MRN: [id number] Date of Birth: June 05, 1950  Beginning of progress report period: December 03, 2018 End of progress report period: December 11, 2018  Today's Date: 12/11/2018 OT Individual Time: 1749-4496 OT Individual Time Calculation (min): 75 min    Patient has met 4 of 5 short term goals.  Pt has made progress with her sitting balance, ability to sit to stand, static standing, L visual scanning, and ability to use hemiplegic dressing strategies. She is also starting to have more AROM of LUE but also has increased tone so integrating into function is challenging at this time, but she can do so with hand over hand guidance. She continues to need max A for a safe squat pivot to the toilet but is progressing to mod A just not consistently.   Patient continues to demonstrate the following deficits: muscle weakness, decreased cardiorespiratoy endurance, abnormal tone, unbalanced muscle activation and motor apraxia, decreased visual perceptual skills, decreased visual motor skills and hemianopsia, decreased attention to left, decreased memory and delayed processing and decreased standing balance, decreased postural control, hemiplegia and decreased balance strategies and therefore will continue to benefit from skilled OT intervention to enhance overall performance with BADL.  Patient progressing toward long term goals..  Continue plan of care.  OT Short Term Goals Week 1:  OT Short Term Goal 1 (Week 1): Pt will maintain unsupported sitting EOC or bed with supervision during UB bathing. OT Short Term Goal 1 - Progress (Week 1): Met OT Short Term Goal 2 (Week 1): Pt will complete UB dressing with mod assist to donn pullover only for 3 consecutive sessions. OT Short Term Goal 2 - Progress (Week 1): Met OT Short Term Goal 3 (Week 1): Pt will complete LB dressing with max assist sit to stand following hemi techniques for donning brief  and pants. OT Short Term Goal 3 - Progress (Week 1): Met OT Short Term Goal 4 (Week 1): Pt will complete toilet transfer to the drop arm commode with mod assist for squat pivot. OT Short Term Goal 4 - Progress (Week 1): Progressing toward goal OT Short Term Goal 5 (Week 1): Pt will locate all grooming and bathing items left of midline with no more than mod instructional cueing. Week 2:  OT Short Term Goal 1 (Week 2): Pt will use LUE to wash RUE with min A. OT Short Term Goal 2 (Week 2): Pt will don shirt with min A. OT Short Term Goal 3 (Week 2): Pt will maintain dynamic stand with min A while using R hand to pull pants over hips 50% of the way. OT Short Term Goal 4 (Week 2): Pt will complete toilet transfers with mod A. OT Short Term Goal 5 (Week 2): Pt will be able to find items in L visual  field with min cues.  Skilled Therapeutic Interventions/Progress Updates:      Pt seen for BADL retraining of toileting, bathing at shower level, and dressing with a focus on use of LUE, L side awareness, postural control, functional mobility. Pt received in recliner and completed sq pivot to L with +2 for safety with max A and then to her R into shower onto tub bench with +2 max with pt assisting with grab bar.  Pt needs frequent cues and A to position her feet in a stable manner.  On tub bench bathed UB with mod A - needing increased A due to body habitus and LB  with max A.  Hand over hand A to use LUE to wash R arm.    Transferred back to w/c with max of 2 to her L side. Pt did well scanning to L with 1 verbal cue prior to transfer.  Followed directions well to use forward lean and wt shift.   See ADL documentation below for details.  With LB dressing, placed pt at side of bed to allow her to use R hand on bed rail with sit to stand and spent time working on static stand progressing to dynamic with pt reaching R hand to pull pants up partially with mod -max to support dyn balance.    Transferred to  recliner and set up with all needs met, alarm on, pillow supporting LUE.     Therapy Documentation Precautions:  Precautions Precautions: Fall Precaution Comments: left neglect Restrictions Weight Bearing Restrictions: No    Pain: Pain Assessment Pain Score: 0-No pain ADL: ADL Eating: Minimal assistance Where Assessed-Eating: Bed level Grooming: Supervision/safety Where Assessed-Grooming: Sitting at sink Upper Body Bathing: Moderate assistance Where Assessed-Upper Body Bathing: Shower Lower Body Bathing: Maximal assistance Where Assessed-Lower Body Bathing: Shower Upper Body Dressing: Moderate assistance Where Assessed-Upper Body Dressing: Chair Lower Body Dressing: Maximal assistance Where Assessed-Lower Body Dressing: Wheelchair Toileting: Maximal assistance Where Assessed-Toileting: Glass blower/designer: Maximal Print production planner Method: Engineer, water: Energy manager: Moderate cueing, Maximal assistance Social research officer, government Method: Education officer, environmental: Radio broadcast assistant, Grab bars   Therapy/Group: Individual Therapy  [facility] 12/11/2018, 11:16 AM

## 2018-12-11 NOTE — Progress Notes (Signed)
[place] PHYSICAL MEDICINE & REHABILITATION PROGRESS NOTE   Subjective/Complaints:  No issues overnite   ROS-no chest pain shortness of breath nausea vomiting diarrhea, +urinary retention objective:   No results found. Recent Labs    12/09/18 0642 12/10/18 0506  WBC 7.9 8.2  HGB 13.6 13.7  HCT 40.9 39.9  PLT 328 337   Recent Labs    12/08/18 0932  NA 139  K 4.2  CL 103  CO2 26  GLUCOSE 165*  BUN 11  CREATININE 0.87  CALCIUM 9.1    Intake/Output Summary (Last 24 hours) at 12/11/2018 0743 Last data filed at 12/10/2018 2230 Gross per 24 hour  Intake 706 ml  Output 250 ml  Net 456 ml     Physical Exam: Vital Signs Blood pressure 126/69, pulse 83, temperature 97.8 F (36.6 C), resp. rate 19, height 5\' 1"  (1.549 m), weight 107.2 kg, SpO2 96 %.  ENT- white plaque on tongu and buccal surface  General: No acute distress Mood and affect are appropriate Heart: Regular rate and rhythm no rubs murmurs or extra sounds Lungs: Clear to auscultation, breathing unlabored, no rales or wheezes Abdomen: Positive bowel sounds, soft nontender to palpation, nondistended Extremities: No clubbing, cyanosis, or edema, no calf pain with palpation skin: No evidence of breakdown, no evidence of rash Neurologic: Cranial nerves II through XII intact, motor strength is 5/5 in Right  deltoid, bicep, tricep, grip, hip flexor, knee extensors, ankle dorsiflexor and plantar flexor, Left 2/5 triceps, trace biceps, 2/5 Knee ext  Sensory exam normal sensation to light touch and proprioception in right and absent in left upper and lower extremities Cerebellar exam normal finger to nose to finger as well as heel to shin in right upper and lower extremities, unable to perform on the left side due to weakness Musculoskeletal: Full range of motion in all 4 extremities. No joint swelling Left neglect noted however was able to reach over with the right hand and touch her left hand and correctly  identified.   Assessment/Plan: 1. 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  Physiatrist is providing close team supervision and 24 hour management of active medical problems listed below.  Physiatrist and rehab team continue to assess barriers to discharge/monitor patient progress toward functional and medical goals  Care Tool:  Bathing    Body parts bathed by patient: Chest, Abdomen, Left arm, Right upper leg, Left upper leg, Face, Front perineal area   Body parts bathed by helper: Right arm, Buttocks, Right lower leg, Left lower leg     Bathing assist Assist Level: Maximal Assistance - Patient 24 - 49%     Upper Body Dressing/Undressing Upper body dressing   What is the patient wearing?: Pull over shirt, Bra    Upper body assist Assist Level: Moderate Assistance - Patient 50 - 74%    Lower Body Dressing/Undressing Lower body dressing      What is the patient wearing?: Pants, Incontinence brief     Lower body assist Assist for lower body dressing: Maximal Assistance - Patient 25 - 49%     Toileting Toileting    Toileting assist Assist for toileting: Maximal Assistance - Patient 25 - 49%     Transfers Chair/bed transfer  Transfers assist     Chair/bed transfer assist level: Maximal Assistance - Patient 25 - 49%     Locomotion  Ambulation   Ambulation assist   Ambulation activity did not occur: Safety/medical concerns  Assist level: 2 helpers Assistive device: Other (comment)(rail) Max distance: 10 ft   Walk 10 feet activity   Assist  Walk 10 feet activity did not occur: Safety/medical concerns  Assist level: 2 helpers Assistive device: Other (comment)(rail)   Walk 50 feet activity   Assist Walk 50 feet with 2 turns activity did not occur: Safety/medical concerns         Walk  150 feet activity   Assist Walk 150 feet activity did not occur: Safety/medical concerns         Walk 10 feet on uneven surface  activity   Assist Walk 10 feet on uneven surfaces activity did not occur: Safety/medical concerns         Wheelchair     Assist Will patient use wheelchair at discharge?: Yes Type of Wheelchair: Manual    Wheelchair assist level: Maximal Assistance - Patient 25 - 49% Max wheelchair distance: 10'    Wheelchair 50 feet with 2 turns activity    Assist    Wheelchair 50 feet with 2 turns activity did not occur: Safety/medical concerns       Wheelchair 150 feet activity     Assist Wheelchair 150 feet activity did not occur: Safety/medical concerns        Medical Problem List and Plan: 1.Left hemiparesis, dysphagia  and functional deficitssecondary to large right MCA infarct CIR PT, OT, SLP, discussed expected long recovery process . 2. Antithrombotics: -DVT/anticoagulation:Mechanical:Sequential compression devices, below kneeBilateral lower extremities Left post tibial DVT extended to L popliteal vein, now felt to be acute with propagation, COnt Eliquis 5mg  BID -antiplatelet therapy: Low dose ASA. 3.OA bilateral knees/Pain Management:tylenol prn. Resume Voltaren gel To bilateral knees.  4. Mood:LCSW to follow for evaluation and support. Patient "nightmare" may be more of a perceptual issue, having some intermittent recognition of the left upper extremity but sometimes has difficulty in recognizing whether this is her own limb or not -antipsychotic agents: N/A 5. Neuropsych: This patientis not fullycapable of making decisions on onown behalf. 6. Skin/Wound Care:Routine pressure relief measures. 7. Fluids/Electrolytes/Nutrition:Monitor I/O. Check lytes in am. 8.HTN: Monitor BP tid--improved control with fewer fluctuations over last 48 hours. Would not add further bp medication at this  point to avoid hypoperfusion. Vitals:   12/10/18 1916 12/11/18 0459  BP: (!) 112/51 126/69  Pulse: 84 83  Resp: 18 19  Temp: 98.2 F (36.8 C) 97.8 F (36.6 C)  SpO2: 95% 96%  controlled 7/30  9. UTI R to Macrobid , no Allergies, cont  Amoxil until 8/3                     10. Prediabetes: Hgb A1c-5.9. CM diet.  11. Morbid obesity: Educate patient on CM/HH diet and importance of weight loss to help promote health and mobility.  Poor intake due to attn may lead to some gradual wt loss 12. Dyslipidemia: On Atorvastatin  13.  Urinary retention, probably form immobility, will try toileting program , cont flomax and urecholine  LOS: 9 days A FACE TO FACE EVALUATION WAS PERFORMED  Charlett Blake 12/11/2018, 7:43 AM

## 2018-12-11 NOTE — Progress Notes (Signed)
Speech Language Pathology Daily Session Note  Patient Details  Name: [name] MRN: [id number] Date of Birth: [birth year]  Today's Date: 12/11/2018 SLP Individual Time: 1300-1345 SLP Individual Time Calculation (min): 45 min  Short Term Goals: Week 2: SLP Short Term Goal 1 (Week 2): Pt will sustain attention to basic task for ~ 45 minute with Min A cues. SLP Short Term Goal 2 (Week 2): Pt will perform semi-complex familiar problem solving tasks related to ADLs with Mod A cues. SLP Short Term Goal 3 (Week 2): Pt will initiate basic familiar problem solving tasks in 8 out of 10 opportunities with Mod A cues. SLP Short Term Goal 4 (Week 2): Pt will scan to midline in 8 out of 10 opportunities with Min A cues.  Skilled Therapeutic Interventions:   Skilled treatment session focused on cognition goals. SLP recieved pt largely supine in recliner with lap belt alarm over her head. SLP aided in repositioning pt in recliner. Pt required Total A to coordinate movements and not work against SLP's attempts. SLP facilitated session by providing Mod A cues for redirection to task while ordering meal items with dietary embassodor. Pt left upright in recliner with lap belt alarm securely in place and all needs within reach. Continue per current plan of care.   Pain    Therapy/Group: Individual Therapy  Daymond Cordts 12/11/2018, 3:16 PM

## 2018-12-11 NOTE — Progress Notes (Signed)
Physical Therapy Session Note  Patient Details  Name: Deanna [name] MRN: [id number] Date of Birth: 07-28-1950  Today's Date: 12/11/2018 PT Individual Time: 2122-4825 PT Individual Time Calculation (min): 56 min   Short Term Goals: Week 2:  PT Short Term Goal 1 (Week 2): Pt will ambulate x 30 ft with LRAD and mod assist PT Short Term Goal 2 (Week 2): Pt will perform sit<>stand with min assist and RW PT Short Term Goal 3 (Week 2): Pt will perform bed<>chair transfers with mod assist  Skilled Therapeutic Interventions/Progress Updates:   Pt received sitting in recliner with her husband present and pt agreeable to therapy session - pt's husband exiting despite encouragement to observe therapy session. Sit<>stands throughout session with min assist for balance. Sit<>stands in steady with CGA for balance. Stedy transfer recliner>w/c. Transported to/from gym in w/c. Ambulated 45ft at hallway rail using R UE support and w/c follow for safety - mod/max assist primarily for balance and L LE swing phase placement due to hip adduction and poor foot clearance. Squat pivot w/c>EOM with +2 mod assist for lifting/pivoting hips and balance/trunk control. Sit>supine with mod assist for trunk descent and B LE management. Performed supine L LE NMR via PNF D1 flexion/extension reversals with emphases on flexion portion with manual facilitation for ankle DF and intermittently for initiating knee flexion. Supine bridging with manual facilitation/assist for L LE placement - pt reporting knee pain during this activity therefore deferred further repetitions at this time. Supine>sit with mod assist for trunk upright and mod cuing for sequencing to increase pt independence. Therapist provided L UE RW orthotic for improved grip/positioning. Standing pre-gait with B UE support on RW focusing on L LE NMR via stepping forward/backwards towards external targets placed to increase hip abduction and step length for improved carryover to  gait - mod/max assist for balance due to heavy L lateral lean despite multimodal cuing and mirror feedback for improved R weight shift onto stance limb - towards end with increased fatigue required max assist to sit back onto mat table. Seated L LE tap up/down on 4" step with multimodal cuing for increased hip/knee flexion when stepping off - required manual facilitation for knee flexion. Squat pivot EOM>w/c with +2 mod assist and cuing for increased hip clearance and pivoting. Transported back to room. Sit<>stands using stedy with CGA. Stedy transfer w/c>recliner. Pt left sitting in recliner with B LEs elevated, seat belt alarm on, and NT present to set-up meal tray.  Therapy Documentation Precautions:  Precautions Precautions: Fall Precaution Comments: left neglect Restrictions Weight Bearing Restrictions: No  Pain: Pt reports intermittent B LE (L>R) knee pain during session - provided rest and repositioned throughout for pain management with pt reporting relief.    Therapy/Group: Individual Therapy  Tawana Scale, PT, DPT 12/11/2018, 5:45 PM

## 2018-12-12 ENCOUNTER — Inpatient Hospital Stay (HOSPITAL_COMMUNITY): Payer: Medicare Other | Admitting: Physical Therapy

## 2018-12-12 ENCOUNTER — Inpatient Hospital Stay (HOSPITAL_COMMUNITY): Payer: Medicare Other | Admitting: Speech Pathology

## 2018-12-12 ENCOUNTER — Inpatient Hospital Stay (HOSPITAL_COMMUNITY): Payer: Medicare Other | Admitting: Occupational Therapy

## 2018-12-12 LAB — GLUCOSE, CAPILLARY
Glucose-Capillary: 118 mg/dL — ABNORMAL HIGH (ref 70–99)
Glucose-Capillary: 129 mg/dL — ABNORMAL HIGH (ref 70–99)
Glucose-Capillary: 151 mg/dL — ABNORMAL HIGH (ref 70–99)

## 2018-12-12 MED ORDER — MAGNESIUM CITRATE PO SOLN
1.0000 | Freq: Once | ORAL | Status: AC
  Administered 2018-12-12: 1 via ORAL
  Filled 2018-12-12: qty 296

## 2018-12-12 MED ORDER — SORBITOL 70 % SOLN
960.0000 mL | TOPICAL_OIL | Freq: Once | ORAL | Status: AC
  Administered 2018-12-12: 960 mL via RECTAL
  Filled 2018-12-12: qty 473

## 2018-12-12 NOTE — Progress Notes (Signed)
[place] PHYSICAL MEDICINE & REHABILITATION PROGRESS NOTE   Subjective/Complaints:  No issues overnite except constipation, no abd pain  ROS-no chest pain shortness of breath nausea vomiting diarrhea, +urinary retention objective:   No results found. Recent Labs    12/10/18 0506  WBC 8.2  HGB 13.7  HCT 39.9  PLT 337   No results for input(s): NA, K, CL, CO2, GLUCOSE, BUN, CREATININE, CALCIUM in the last 72 hours.  Intake/Output Summary (Last 24 hours) at 12/12/2018 0729 Last data filed at 12/11/2018 1819 Gross per 24 hour  Intake 900 ml  Output -  Net 900 ml     Physical Exam: Vital Signs Blood pressure (!) 146/61, pulse 67, temperature 98 F (36.7 C), temperature source Oral, resp. rate 20, height 5\' 1"  (1.549 m), weight 107.2 kg, SpO2 98 %.  ENT- white plaque on tongu and buccal surface  General: No acute distress Mood and affect are appropriate Heart: Regular rate and rhythm no rubs murmurs or extra sounds Lungs: Clear to auscultation, breathing unlabored, no rales or wheezes Abdomen: Positive bowel sounds, soft nontender to palpation, nondistended Extremities: No clubbing, cyanosis, or edema, no calf pain with palpation skin: No evidence of breakdown, no evidence of rash Neurologic: Cranial nerves II through XII intact, motor strength is 5/5 in Right  deltoid, bicep, tricep, grip, hip flexor, knee extensors, ankle dorsiflexor and plantar flexor, Left 2+/5 triceps, trace biceps, 2+/5 Knee ext  Sensory exam normal sensation to light touch and proprioception in right and absent in left upper and lower extremities unchanged Cerebellar exam normal finger to nose to finger as well as heel to shin in right upper and lower extremities, unable to perform on the left side due to weakness Musculoskeletal: Full range of motion in all 4 extremities. No joint swelling Left neglect noted   Assessment/Plan: 1. Functional deficits secondary to Large R MCA infarct with hemorrhagic  conversion and cerebral  Edema which require 3+ hours per day of interdisciplinary therapy in a comprehensive inpatient rehab setting.  Physiatrist is providing close team supervision and 24 hour management of active medical problems listed below.  Physiatrist and rehab team continue to assess barriers to discharge/monitor patient progress toward functional and medical goals  Care Tool:  Bathing    Body parts bathed by patient: Chest, Abdomen, Left arm, Right upper leg, Left upper leg, Face, Front perineal area   Body parts bathed by helper: Right arm, Buttocks, Right lower leg, Left lower leg     Bathing assist Assist Level: Maximal Assistance - Patient 24 - 49%     Upper Body Dressing/Undressing Upper body dressing   What is the patient wearing?: Pull over shirt, Bra    Upper body assist Assist Level: Moderate Assistance - Patient 50 - 74%    Lower Body Dressing/Undressing Lower body dressing      What is the patient wearing?: Pants, Incontinence brief     Lower body assist Assist for lower body dressing: Maximal Assistance - Patient 25 - 49%     Toileting Toileting    Toileting assist Assist for toileting: Maximal Assistance - Patient 25 - 49%     Transfers Chair/bed transfer  Transfers assist     Chair/bed transfer assist level: 2 Helpers     Locomotion Ambulation   Ambulation assist   Ambulation activity did not occur: Safety/medical concerns  Assist level: 2 helpers(w/c follow) Assistive device: Other (comment)(hallway rail) Max distance: 20ft   Walk 10 feet activity   Assist  Walk 10 feet activity did not occur: Safety/medical concerns  Assist level: 2 helpers(w/c follow) Assistive device: Other (comment)(hallway rail)   Walk 50 feet activity   Assist Walk 50 feet with 2 turns activity did not occur: Safety/medical concerns         Walk 150 feet activity   Assist Walk 150 feet activity did not occur: Safety/medical concerns          Walk 10 feet on uneven surface  activity   Assist Walk 10 feet on uneven surfaces activity did not occur: Safety/medical concerns         Wheelchair     Assist Will patient use wheelchair at discharge?: Yes Type of Wheelchair: Manual    Wheelchair assist level: Maximal Assistance - Patient 25 - 49% Max wheelchair distance: 10'    Wheelchair 50 feet with 2 turns activity    Assist    Wheelchair 50 feet with 2 turns activity did not occur: Safety/medical concerns       Wheelchair 150 feet activity     Assist Wheelchair 150 feet activity did not occur: Safety/medical concerns        Medical Problem List and Plan: 1.Left hemiparesis, dysphagia  and functional deficitssecondary to large right MCA infarct CIR PT, OT, SLP, . 2. Antithrombotics: -DVT/anticoagulation:Mechanical:Sequential compression devices, below kneeBilateral lower extremities Left post tibial DVT extended to L popliteal vein, now felt to be acute with propagation, cont Eliquis 5mg  BID -antiplatelet therapy: Low dose ASA. 3.OA bilateral knees/Pain Management:tylenol prn. Resume Voltaren gel To bilateral knees.  4. Mood:LCSW to follow for evaluation and support. Patient "nightmare" may be more of a perceptual issue, having some intermittent recognition of the left upper extremity but sometimes has difficulty in recognizing whether this is her own limb or not -antipsychotic agents: N/A 5. Neuropsych: This patientis not fullycapable of making decisions on onown behalf. 6. Skin/Wound Care:Routine pressure relief measures. 7. Fluids/Electrolytes/Nutrition:Monitor I/O. Check lytes in am. 8.HTN: Monitor BP tid--improved control with fewer fluctuations over last 48 hours. Would not add further bp medication at this point to avoid hypoperfusion. Vitals:   12/11/18 2021 12/12/18 0625  BP: (!) 110/54 (!) 146/61  Pulse: 84 67  Resp:  20  Temp:  98  F (36.7 C)  SpO2:  98%  controlled 7/31  9. UTI R to Macrobid , no Allergies, cont  Amoxil until 8/3                     10. Prediabetes: Hgb A1c-5.9. CM diet.  11. Morbid obesity: Educate patient on CM/HH diet and importance of weight loss to help promote health and mobility.  Poor intake due to attn may lead to some gradual wt loss 12. Dyslipidemia: On Atorvastatin  13.  Urinary retention, probably form immobility, will try toileting program , cont flomax and urecholine 14.  Constipation Mg Citratre this am and SMOG this pm LOS: 10 days A FACE TO FACE EVALUATION WAS PERFORMED  Charlett Blake 12/12/2018, 7:29 AM

## 2018-12-12 NOTE — Progress Notes (Signed)
Speech Language Pathology Daily Session Note  Patient Details  Name: Deanna [name] MRN: [id number] Date of Birth: 18-Aug-1950  Today's Date: 12/12/2018 SLP Individual Time: 8366-2947 SLP Individual Time Calculation (min): 30 min  Short Term Goals: Week 2: SLP Short Term Goal 1 (Week 2): Pt will sustain attention to basic task for ~ 45 minute with Min A cues. SLP Short Term Goal 2 (Week 2): Pt will perform semi-complex familiar problem solving tasks related to ADLs with Mod A cues. SLP Short Term Goal 3 (Week 2): Pt will initiate basic familiar problem solving tasks in 8 out of 10 opportunities with Mod A cues. SLP Short Term Goal 4 (Week 2): Pt will scan to midline in 8 out of 10 opportunities with Min A cues.  Skilled Therapeutic Interventions:  Skilled treatment session focused on cognition goals. SLP recieved pt upright in wheelchair. SLP facilitiated session by providing hand over hand assistance to play Connect 4 to utilize scanning to left and basic problem solving. Pt appeared to enjoy the game and was able to locate areas that opponent had 3 checkers in a row. Pt was in good mood with no indication of pain. Pt left upright in wheelchair with all needs within reach. Continue per current plan of care.      Pain Pain Assessment Pain Scale: 0-10 Pain Score: 0  Therapy/Group: Individual Therapy  Deanna [name] 12/12/2018, 1:33 PM

## 2018-12-12 NOTE — Progress Notes (Signed)
Spoke with Patients daughter, Deanna [name], regarding patients night last night. Patient's daughter complained about her mother's call lights not being answered in a timely manner. She also informed the RN that they have went "above our heads" already regarding this. Daughter states that patients phone and call light is never with-in reach of the patient. RN informed daughter that our policy is to always keep call lights with-in reach and to make hourly rounds on patients. RN also advised that per charge nurse, the patient can have one visitor come spend that night if this would help.   Nicholes Rough, RN

## 2018-12-12 NOTE — Progress Notes (Signed)
Occupational Therapy Session Note  Patient Details  Name: Deanna [name] MRN: [id number] Date of Birth: [birth year]  Today's Date: 12/12/2018 OT Individual Time: 0945-1100 OT Individual Time Calculation (min): 75 min    Short Term Goals: Week 1:  OT Short Term Goal 1 (Week 1): Pt will maintain unsupported sitting EOC or bed with supervision during UB bathing. OT Short Term Goal 1 - Progress (Week 1): Met OT Short Term Goal 2 (Week 1): Pt will complete UB dressing with mod assist to donn pullover only for 3 consecutive sessions. OT Short Term Goal 2 - Progress (Week 1): Met OT Short Term Goal 3 (Week 1): Pt will complete LB dressing with max assist sit to stand following hemi techniques for donning brief and pants. OT Short Term Goal 3 - Progress (Week 1): Met OT Short Term Goal 4 (Week 1): Pt will complete toilet transfer to the drop arm commode with mod assist for squat pivot. OT Short Term Goal 4 - Progress (Week 1): Progressing toward goal OT Short Term Goal 5 (Week 1): Pt will locate all grooming and bathing items left of midline with no more than mod instructional cueing. Week 2:  OT Short Term Goal 1 (Week 2): Pt will use LUE to wash RUE with min A. OT Short Term Goal 2 (Week 2): Pt will don shirt with min A. OT Short Term Goal 3 (Week 2): Pt will maintain dynamic stand with min A while using R hand to pull pants over hips 50% of the way. OT Short Term Goal 4 (Week 2): Pt will complete toilet transfers with mod A. OT Short Term Goal 5 (Week 2): Pt will be able to find items in L visual  field with min cues.  Skilled Therapeutic Interventions/Progress Updates:    Pt received in recliner and agreeable to working on UB bathing and UB/ LB dressing only as this therapist only had +2 A from a female tech and pt was not comfortable bathing with a female caregiver.  With therapist only, pt completed UB self care with mod A using hand over hand guidance with LUE with mod A.  Pt continues to have L  inattention, increased LUE tone and apraxia so although she has developing movement she is having great difficulty utilizing L UE.   Her female nurse tech came in to A with LB dressing. Pt was able to rise to stand with min A and then she could use her R hand with min A for balance to pull down old pants and pull up new ones 75% of the way.  Female tech arrived to assist with squat pivot transfer recliner to w/c.  Pt able to complete with max A with 2nd person guiding her hips to her L.  Completed oral care at the sink.    Pt worked on [employer] with guided AROM using UE Ranger, B hands on hula hoop, and B hands on dowel bar.  She needs fairly constant cues to keep scanning to her L to attend to LUE with movement exercises.  Sh flex to 60 , abd 60,  elb flex to 90, grasp flex and release 90 % of range.    Pt taken to room with dynavision to work on scanning skills to her L.  With only the first 2 rings activated pt worked on scanning task with a response time of approx 1.5 sec in R field, 3.5 in upper L quadrant, and 5.5 in lower L quadrant.  Pt can  find the lighted squares but needed cues 50% of the time to turn her head more to the L to find them.  Pt taken back to her room with belt alarm on, lap tray in place and all needs met.   Therapy Documentation Precautions:  Precautions Precautions: Fall Precaution Comments: left neglect Restrictions Weight Bearing Restrictions: No    Vital Signs: Therapy Vitals Temp: 98 F (36.7 C) Temp Source: Oral Pulse Rate: 67 Resp: 20 BP: (!) 146/61 Patient Position (if appropriate): Lying Oxygen Therapy SpO2: 98 % O2 Device: Room Air Pain: Pain Assessment Pain Scale: 0-10 Pain Score: 8  Pain Type: Acute pain Pain Location: Back Pain Orientation: Lower;Mid Pain Descriptors / Indicators: Aching;Discomfort;Throbbing;Tightness Pain Frequency: Intermittent Pain Onset: Gradual Patients Stated Pain Goal: 2 Pain Intervention(s): Medication (See  eMAR);Repositioned(up in recliner)    Therapy/Group: Individual Therapy  [place] 12/12/2018, 8:51 AM

## 2018-12-12 NOTE — Progress Notes (Signed)
Physical Therapy Session Note  Patient Details  Name: Deanna [name] MRN: [id number] Date of Birth: January 06, 1951  Today's Date: 12/12/2018 PT Individual Time: 6301-6010 PT Individual Time Calculation (min): 73 min   Short Term Goals: Week 2:  PT Short Term Goal 1 (Week 2): Pt will ambulate x 30 ft with LRAD and mod assist PT Short Term Goal 2 (Week 2): Pt will perform sit<>stand with min assist and RW PT Short Term Goal 3 (Week 2): Pt will perform bed<>chair transfers with mod assist  Skilled Therapeutic Interventions/Progress Updates:   Pt received sitting in w/c with her husband present and pt agreeable to therapy session - pt's husband defers joining therapy session today. Transported to/from gym in w/c. Stand/squat pivot w/c>EOM with max cuing for sequencing and increased hip clearance - mod assist of 1 person and min assist of 2nd person for pivoting hips. Sit<>stand EOM<>RW min assist of 1 and mod/max assist for L hand placement on RW orthotic. Standing with RW, performed pre-gait L LE NMR task of stepping forward/backwards towards external target with mod assist of 1 and min assist of 2nd person for balance and max multimodal cuing for sequencing - mirror feedback for lateral weight shifting - pt performed visual imagery to recall steps/sequencing of task - pt noted to lack knee flexion during task therefore therapist transitioned to manually facilitating L LE for increased hip/knee flexion with mod assist of 2nd person for balance and x3 instances of max assist due to pt having L lateral lean/LOB - on 4th round of task pt reports "electric" shock pain down L LE therefore therapist provided prolonged seated rest break for pain management. Pt reports relief. Performed sitting balance task reaching outside BOS to grasp 2lb weight to increase perturbation with supervision as pt demonstrating significantly improved trunk control. Performed sitting balance task with focus on L attention and L UE NMR to  grasp card with L hand, transition to R hand, and then match on a board slightly offset to the L all with supervision - pt demonstrates increased difficulty matching cards of same color and suit but different number requiring increased time. Squat/stand pivot EOM>w/c with mod A +2 for lifting/pivoting hips - max cuing for increased hip clearance - pt again reports transferring to the L twists her knee causing pain despite therapist repositioning in throughout the transfer for pain management. Deferred further standing tasks this session due to L knee pain. Max assist w/c propulsion ~86ft using B UEs with hand-over-hand assist for L UE placement/sequencing for NMR with max cuing for sequencing - transitioned to R LE w/c propulsion with max assist as pt's foot not fully reaching ground focusing on activity tolerance. Pt returned to room and left sitting in w/c with needs in reach, seat belt alarm on, and her husband present.   Therapy Documentation Precautions:  Precautions Precautions: Fall Precaution Comments: left neglect Restrictions Weight Bearing Restrictions: No  Pain: Reports L knee joint and DVT pain fluctuates during session depending on L knee positioning and activity - provided seated rest breaks and modified therapy interventions for pain management.   Therapy/Group: Individual Therapy  Tawana Scale, PT, DPT 12/12/2018, 3:29 PM

## 2018-12-13 ENCOUNTER — Inpatient Hospital Stay (HOSPITAL_COMMUNITY): Payer: Medicare Other | Admitting: Physical Therapy

## 2018-12-13 DIAGNOSIS — R7303 Prediabetes: Secondary | ICD-10-CM

## 2018-12-13 DIAGNOSIS — I82442 Acute embolism and thrombosis of left tibial vein: Secondary | ICD-10-CM

## 2018-12-13 DIAGNOSIS — R0989 Other specified symptoms and signs involving the circulatory and respiratory systems: Secondary | ICD-10-CM

## 2018-12-13 DIAGNOSIS — K5901 Slow transit constipation: Secondary | ICD-10-CM

## 2018-12-13 LAB — GLUCOSE, CAPILLARY
Glucose-Capillary: 127 mg/dL — ABNORMAL HIGH (ref 70–99)
Glucose-Capillary: 124 mg/dL — ABNORMAL HIGH (ref 70–99)
Glucose-Capillary: 128 mg/dL — ABNORMAL HIGH (ref 70–99)
Glucose-Capillary: 139 mg/dL — ABNORMAL HIGH (ref 70–99)

## 2018-12-13 NOTE — Patient Care Conference (Signed)
Inpatient RehabilitationTeam Conference and Plan of Care Update Date: 12/10/2018   Time: 11:25 AM    Patient Name: Deanna [name]      Medical Record Number: [id number]  Date of Birth: 1950/06/21 Sex: Female         Room/Bed: 4W16C/4W16C-01 Payor Info: Payor: MEDICARE / Plan: MEDICARE PART A AND B / Product Type: *No Product type* /    Admitting Diagnosis: 4. CVA 2 Team  RT. CVA; 22-24days  Admit Date/Time:  12/02/2018  3:54 PM Admission Comments: No comment available   Primary Diagnosis:  <principal problem not specified> Principal Problem: <principal problem not specified>  Patient Active Problem List   Diagnosis Date Noted  . Slow transit constipation   . Labile blood pressure   . Acute deep vein thrombosis (DVT) of left tibial vein (HCC)   . Acute left hemiparesis ([place]) 12/02/2018  . Hyperlipemia 12/02/2018  . Prediabetes 12/02/2018  . Morbid obesity ([place]) 12/02/2018  . Chronic back pain 12/02/2018  . Urinary retention 12/02/2018  . Acute ischemic right MCA stroke ([place]) 12/02/2018  . Acute ischemic stroke ([place]) large R MCA and 2 L infarcts s/p tPA and mechanical thrombectomy 11/29/2018  . 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 S/P right knee arthroscopy 07/09/2017  . S/P knee surgery 01/10/2017    Expected Discharge Date: Expected Discharge Date: 12/27/18  Team Members Present: Physician leading conference: Dr. Alysia Penna Social Worker Present: Alfonse Alpers, LCSW Nurse Present: Rayetta Humphrey, RN PT Present: Michaelene Song, PT SLP Present: Charolett Bumpers, SLP PPS Coordinator present : Ileana Ladd, PT     Current Status/Progress Goal Weekly Team Focus  Medical   Severe left hemiparesis left neglect DVT left lower extremity discontinuing heparin and starting Eliquis  Reduce fall risk reduce recurrent stroke risk  Switch to Eliquis monitor neuro status   Bowel/Bladder   patient is continent of bowel and bladder ; pvr every 6 hours          Swallow/Nutrition/ Hydration   supervision with meals         ADL's   needs help with dressing ; pulling pants down and up due to left side weakness  min to mod assist  ADL training, functional mobility, visual training, balance retraining, NMR for LUE/LLE   Mobility   mod assist bed mobility, min assist sit<>stand using RW, +2 max assist squat pivot bed<>chair transfers, max assist 20ft gait at hallway rail with +2 assist for w/c follow  min assist  L NMR, pre-gait progressed to gait training, standing balance, L attention, trunk control   Communication   slurrred speech at times         Safety/Cognition/ Behavioral Observations  high risk  Min A - upgraded to semi-complex problem solving  semi-complex problem solving, selective attention, emergent   Pain   no complaints of pain         Skin   ecchymosis both arms          Rehab Goals Patient on target to meet rehab goals: Yes Rehab Goals Revised: none *See Care Plan and progress notes for long and short-term goals.     Barriers to Discharge  Current Status/Progress Possible Resolutions Date Resolved   Physician    Medical stability;Other (comments)  IV heparin  Continue rehab program  See above      Nursing                  PT  OT                  SLP                SW                Discharge Planning/Teaching Needs:  Pt to return to her home with her husband and three dtrs taking turns caring for pt.  Family education closer to d/c.   Team Discussion:  Pt is coming off IV heparin today and converted to Eliquis, will watch neuro status.  Pt c/o back pain in bed.  Has active movement of upper and lower part of limb.  Pt with in/out caths needed still and MD to start medication for this.  Pt is mod A for UB bathing and dressing max A for LB bathing and dressing.  Pt is max A for toileting.  Pt is min/mod A for sit to stand with RW, mod/max A squat pivot txs.  Still with left field neglect, poor  proprioception,.  Goals are min/mod A.  Pt was limited with PT with DVT, but can now do more.  Stands well with mod A and will try stand pivot txs with RW. Cognition is improving and a few goals are upgraded, problem solving and attention.   Revisions to Treatment Plan:  none    Continued Need for Acute Rehabilitation Level of Care: The patient requires daily medical management by a physician with specialized training in physical medicine and rehabilitation for the following conditions: Daily direction of a multidisciplinary physical rehabilitation program to ensure safe treatment while eliciting the highest outcome that is of practical value to the patient.: Yes Daily medical management of patient stability for increased activity during participation in an intensive rehabilitation regime.: Yes Daily analysis of laboratory values and/or radiology reports with any subsequent need for medication adjustment of medical intervention for : Neurological problems;Other   I attest that I was present, lead the team conference, and concur with the assessment and plan of the team.Team conference was held via web/ teleconference due to [place] - 19.   Dock Baccam, Silvestre Mesi 12/13/2018, 7:24 PM

## 2018-12-13 NOTE — Progress Notes (Signed)
PHYSICAL MEDICINE & REHABILITATION PROGRESS NOTE   Subjective/Complaints: Patient seen sitting up in her chair this morning.  She states she slept better overnight.  ROS: Denies CP, SOB, N/V/D  objective:   No results found. No results for input(s): WBC, HGB, HCT, PLT in the last 72 hours. No results for input(s): NA, K, CL, CO2, GLUCOSE, BUN, CREATININE, CALCIUM in the last 72 hours.  Intake/Output Summary (Last 24 hours) at 12/13/2018 0934 Last data filed at 12/13/2018 0700 Gross per 24 hour  Intake 720 ml  Output -  Net 720 ml     Physical Exam: Vital Signs Blood pressure (!) 105/56, pulse 83, temperature 97.6 F (36.4 C), temperature source Oral, resp. rate 16, height 5\' 1"  (1.549 m), weight 107.2 kg, SpO2 95 %. Constitutional: No distress . Vital signs reviewed. HENT: Normocephalic.  Atraumatic. Eyes: EOMI. No discharge. Cardiovascular: No JVD. Respiratory: Normal effort. GI: Non-distended. Musc: Mild lower extremity edema Neurologic: Alert Motor: RUE/RLE: 5/5 proximal distal LUE/LLE: 3/5 proximal to distal Psych: Normal mood.  Normal behavior.  Normal affect.  Assessment/Plan: 1. 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  Physiatrist is providing close team supervision and 24 hour management of active medical problems listed below.  Physiatrist and rehab team continue to assess barriers to discharge/monitor patient progress toward functional and medical goals  Care Tool:  Bathing    Body parts bathed by patient: Chest, Abdomen, Face, Right arm(UB only this am)   Body parts bathed by helper: Left arm     Bathing assist Assist Level: Minimal Assistance - Patient > 75%     Upper Body Dressing/Undressing Upper body dressing   What is the patient wearing?: Pull over shirt, Bra    Upper  body assist Assist Level: Moderate Assistance - Patient 50 - 74%    Lower Body Dressing/Undressing Lower body dressing      What is the patient wearing?: Pants, Incontinence brief     Lower body assist Assist for lower body dressing: Maximal Assistance - Patient 25 - 49%     Toileting Toileting    Toileting assist Assist for toileting: Maximal Assistance - Patient 25 - 49%     Transfers Chair/bed transfer  Transfers assist     Chair/bed transfer assist level: 2 Helpers     Locomotion Ambulation   Ambulation assist   Ambulation activity did not occur: Safety/medical concerns  Assist level: 2 helpers(w/c follow) Assistive device: Other (comment)(hallway rail) Max distance: 31ft   Walk 10 feet activity   Assist  Walk 10 feet activity did not occur: Safety/medical concerns  Assist level: 2 helpers(w/c follow) Assistive device: Other (comment)(hallway rail)   Walk 50 feet activity   Assist Walk 50 feet with 2 turns activity did not occur: Safety/medical concerns         Walk 150 feet activity   Assist Walk 150 feet activity did not occur: Safety/medical concerns         Walk 10 feet on uneven surface  activity   Assist Walk 10 feet on uneven surfaces activity did not occur: Safety/medical concerns         Wheelchair     Assist Will patient use wheelchair at discharge?: Yes Type of Wheelchair: Manual    Wheelchair assist level: Maximal Assistance - Patient 25 - 49% Max wheelchair distance: 57ft  Wheelchair 50 feet with 2 turns activity    Assist    Wheelchair 50 feet with 2 turns activity did not occur: Safety/medical concerns       Wheelchair 150 feet activity     Assist Wheelchair 150 feet activity did not occur: Safety/medical concerns        Medical Problem List and Plan: 1.Left hemiparesis, dysphagia  and functional deficitssecondary to large right MCA infarct  Continue CIR 2.  Antithrombotics: -DVT/anticoagulation:  Sequential compression devices, below kneeBilateral lower extremities DC'd   Left post tibial DVT extended to L popliteal vein, now felt to be acute with propagation, Eliquis  -antiplatelet therapy: Low dose ASA. 3.OA bilateral knees/Pain Management:tylenol prn.  Voltaren gel to bilateral knees.  4. Mood:LCSW to follow for evaluation and support. Patient "nightmare" may be more of a perceptual issue, having some intermittent recognition of the left upper extremity but sometimes has difficulty in recognizing whether this is her own limb or not -antipsychotic agents: N/A 5. Neuropsych: This patientis not fully capable of making decisions on onown behalf. 6. Skin/Wound Care:Routine pressure relief measures. 7. Fluids/Electrolytes/Nutrition:Monitor I/O.  8.HTN: Monitor BP.  Avoid hypoperfusion Vitals:   12/12/18 2136 12/13/18 0322  BP: 130/66 (!) 105/56  Pulse: 97 83  Resp: 20 16  Temp: 98.8 F (37.1 C) 97.6 F (36.4 C)  SpO2: 95% 95%   Labile on 8/1 9. UTI: R to Macrobid , no Allergies, continue amoxicillin until 8/3                      10. Prediabetes: Hgb A1c-5.9. CM diet.   CBGs labile on 8/1 11. Morbid obesity: Educate patient on CM/HH diet and importance of weight loss to help promote health and mobility.  12. Dyslipidemia: On Atorvastatin 13.  Urinary retention, probably form immobility, will try toileting program , cont flomax and urecholine  Appears to be improving 14.  Constipation:  Improving  LOS: 11 days A FACE TO FACE EVALUATION WAS PERFORMED   Lorie Phenix 12/13/2018, 9:34 AM

## 2018-12-13 NOTE — Progress Notes (Signed)
Social Work Patient ID: Deanna [name], female   DOB: 1950/09/11, 68 y.o.   MRN: [id number]   CSW met with pt and her husband to update them on team conference discussion and targeted d/c date of 12-27-18.  Pt got very quiet and she was upset that she would be on CIR for 2 more weeks.  Her husband agreed and tried to encourage her that she needed it and CSW encouraged her to take advantage of all of the therapy available to her.  She continued to complain of the bed and it hurting her back.  CSW spoke to charge nurse about anything they could do to the bed.  Nursing to look into this.  CSW will continue to follow and assist as needed.

## 2018-12-13 NOTE — Progress Notes (Signed)
Physical Therapy Session Note  Patient Details  Name: Deanna [name] MRN: [id number] Date of Birth: [birth year]  Today's Date: 12/13/2018 PT Individual Time: 1124-1206 PT Individual Time Calculation (min): 42 min   Short Term Goals: Week 2:  PT Short Term Goal 1 (Week 2): Pt will ambulate x 30 ft with LRAD and mod assist PT Short Term Goal 2 (Week 2): Pt will perform sit<>stand with min assist and RW PT Short Term Goal 3 (Week 2): Pt will perform bed<>chair transfers with mod assist  Skilled Therapeutic Interventions/Progress Updates:    Pt received sitting in stedy with nursing staff present assisting pt with dressing - pt agreeable to therapy session and therapist assumed care of patient. Pt requesting RN to apply voltaren gel to bilateral knees prior to therapy session - RN present to perform. Sit<>stands using stedy with CGA from stedy seat and W/C. Seated to standing LB clothing management using stedy with max assist for time management and CGA for standing balance. Transported to/from gym in w/c. Ambulated 45ft x2 using R UE support on hallway rail with mod assist for balance and intermittent assist for L LE management as pt is demonstrating improving control to prevent L LE adduction - max multimodal cuing for sequencing - w/c follow for safety. Ambulated 16ft x2 using RW with mod/max assist progressed to all max assist due to fatigue for balance and AD management - max multimodal cuing throughout for sequencing of B LE stepping, proper L LE placement during swing phase, and R/L lateral weight shift onto stance limb as pt continues to demonstrate decreased R weight shift for improved L LE foot clearance - w/c follow for safety. Performed stand pivot transfer w/c<>EOM using RW with max assist for balance and AD management as well as max multimodal cuing for sequencing - +2 assist providing close supervision for safety. Transported back to room and pt requesting to return to recliner. Sit<>stand using  stedy again with CGA for steadying. Stedy transfer w/c>recliner. Pt left sitting in recliner with needs in reach, L UE support,  BLEs elevated and seat belt alarm on.   Therapy Documentation Precautions:  Precautions Precautions: Fall Precaution Comments: left neglect Restrictions Weight Bearing Restrictions: No  Pain: Requests RN apply voltaren gel at beginning of PT session but upon questioning pt denies pain during session.   Therapy/Group: Individual Therapy  Tawana Scale, PT, DPT 12/13/2018, 7:54 AM

## 2018-12-14 ENCOUNTER — Inpatient Hospital Stay (HOSPITAL_COMMUNITY): Payer: Medicare Other | Admitting: Physical Therapy

## 2018-12-14 DIAGNOSIS — R350 Frequency of micturition: Secondary | ICD-10-CM

## 2018-12-14 DIAGNOSIS — I10 Essential (primary) hypertension: Secondary | ICD-10-CM

## 2018-12-14 LAB — GLUCOSE, CAPILLARY
Glucose-Capillary: 131 mg/dL — ABNORMAL HIGH (ref 70–99)
Glucose-Capillary: 137 mg/dL — ABNORMAL HIGH (ref 70–99)

## 2018-12-14 NOTE — Progress Notes (Signed)
[place] PHYSICAL MEDICINE & REHABILITATION PROGRESS NOTE   Subjective/Complaints: Patient seen in the study this morning.  She states she slept fairly well overnight.  She notes constipation.  ROS: + Constipation.  Denies CP, SOB, N/V/D  objective:   No results found. No results for input(s): WBC, HGB, HCT, PLT in the last 72 hours. No results for input(s): NA, K, CL, CO2, GLUCOSE, BUN, CREATININE, CALCIUM in the last 72 hours.  Intake/Output Summary (Last 24 hours) at 12/14/2018 0935 Last data filed at 12/14/2018 0805 Gross per 24 hour  Intake 360 ml  Output -  Net 360 ml     Physical Exam: Vital Signs Blood pressure 123/60, pulse 81, temperature 97.8 F (36.6 C), temperature source Oral, resp. rate 16, height 5\' 1"  (1.549 m), weight 107.2 kg, SpO2 97 %. Constitutional: No distress . Vital signs reviewed. HENT: Normocephalic.  Atraumatic. Eyes: EOMI.  No discharge. Cardiovascular: No JVD. Respiratory: Normal effort. GI: Non-distended. Musc: Mild lower extremity edema Neurologic: Alert Motor: RUE/RLE: 5/5 proximal distal, unchanged LUE/LLE: 3/5 proximal to distal, unchanged Psych: Normal mood.  Normal behavior.  Normal affect.  Assessment/Plan: 1. 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  Physiatrist is providing close team supervision and 24 hour management of active medical problems listed below.  Physiatrist and rehab team continue to assess barriers to discharge/monitor patient progress toward functional and medical goals  Care Tool:  Bathing    Body parts bathed by patient: Chest, Abdomen, Face, Right arm(UB only this am)   Body parts bathed by helper: Left arm     Bathing assist Assist Level: Minimal Assistance - Patient > 75%     Upper Body Dressing/Undressing Upper body dressing   What is  the patient wearing?: Pull over shirt, Bra    Upper body assist Assist Level: Moderate Assistance - Patient 50 - 74%    Lower Body Dressing/Undressing Lower body dressing      What is the patient wearing?: Pants, Incontinence brief     Lower body assist Assist for lower body dressing: Maximal Assistance - Patient 25 - 49%     Toileting Toileting    Toileting assist Assist for toileting: Maximal Assistance - Patient 25 - 49%     Transfers Chair/bed transfer  Transfers assist     Chair/bed transfer assist level: 2 Helpers     Locomotion Ambulation   Ambulation assist   Ambulation activity did not occur: Safety/medical concerns  Assist level: 2 helpers(w/c follow) Assistive device: Walker-rolling Max distance: 46ft   Walk 10 feet activity   Assist  Walk 10 feet activity did not occur: Safety/medical concerns  Assist level: 2 helpers(w/c follow) Assistive device: Walker-rolling   Walk 50 feet activity   Assist Walk 50 feet with 2 turns activity did not occur: Safety/medical concerns         Walk 150 feet activity   Assist Walk 150 feet activity did not occur: Safety/medical concerns         Walk 10 feet on uneven surface  activity   Assist Walk 10 feet on uneven surfaces activity did not occur: Safety/medical concerns         Wheelchair     Assist Will patient use wheelchair at discharge?: Yes Type of Wheelchair: Manual    Wheelchair assist level: Maximal Assistance - Patient 25 - 49% Max wheelchair distance:  45ft    Wheelchair 50 feet with 2 turns activity    Assist    Wheelchair 50 feet with 2 turns activity did not occur: Safety/medical concerns       Wheelchair 150 feet activity     Assist Wheelchair 150 feet activity did not occur: Safety/medical concerns        Medical Problem List and Plan: 1.Left hemiparesis, dysphagia  and functional deficitssecondary to large right MCA infarct  Continue  CIR 2. Antithrombotics: -DVT/anticoagulation:  Sequential compression devices, below kneeBilateral lower extremities DC'd   Left post tibial DVT extended to L popliteal vein, now felt to be acute with propagation, continue Eliquis- no signs of bleeding  -antiplatelet therapy: Low dose ASA. 3.OA bilateral knees/Pain Management:tylenol prn.  Voltaren gel to bilateral knees.  4. Mood:LCSW to follow for evaluation and support. Patient "nightmare" may be more of a perceptual issue, having some intermittent recognition of the left upper extremity but sometimes has difficulty in recognizing whether this is her own limb or not -antipsychotic agents: N/A 5. Neuropsych: This patientis not fully capable of making decisions on onown behalf. 6. Skin/Wound Care:Routine pressure relief measures. 7. Fluids/Electrolytes/Nutrition:Monitor I/O.  8.HTN: Monitor BP.  Avoid hypoperfusion Vitals:   12/13/18 2007 12/14/18 0535  BP: (!) 119/55 123/60  Pulse: 92 81  Resp: 16 16  Temp: 98 F (36.7 C) 97.8 F (36.6 C)  SpO2: 97% 97%   Relatively stable on 8/2 9. UTI: R to Macrobid , no Allergies, continue amoxicillin until 8/3                      10. Prediabetes: Hgb A1c-5.9. CM diet.   CBGs elevated on 8/2, will consider medication if persistently elevated 11. Morbid obesity: Educate patient on CM/HH diet and importance of weight loss to help promote health and mobility.  12. Dyslipidemia: On Atorvastatin 13.  Urinary retention, probably form immobility, will try toileting program , cont flomax and urecholine  Now with?  Frequency, monitor for trend 14.  Constipation:  Had 2 good bowel movements yesterday, states she is having some difficulty today, will consider medication adjustments further if continues to have difficulty  LOS: 12 days A FACE TO FACE EVALUATION WAS PERFORMED  Ankit Lorie Phenix 12/14/2018, 9:35 AM

## 2018-12-14 NOTE — Progress Notes (Signed)
Physical Therapy Session Note  Patient Details  Name: Deanna [name] MRN: [id number] Date of Birth: 05/26/1950  Today's Date: 12/14/2018 PT Individual Time: 1004-1101 PT Individual Time Calculation (min): 57 min   Short Term Goals: Week 2:  PT Short Term Goal 1 (Week 2): Pt will ambulate x 30 ft with LRAD and mod assist PT Short Term Goal 2 (Week 2): Pt will perform sit<>stand with min assist and RW PT Short Term Goal 3 (Week 2): Pt will perform bed<>chair transfers with mod assist  Skilled Therapeutic Interventions/Progress Updates:   Pt received sitting on BSC over toilet with stedy and nursing staff present - pt requesting time to complete toileting but agreeable to therapy session. Reports having a "terrible" morning stating that she didn't sleep well and that she now feels rushed to use the bathroom because she doesn't want to miss therapy time. Pt had no bowel or bladder activity and requested to return to toilet at end of therapy session to try again. Sit<>stand using stedy with CGA for steadying. Stedy transfer [facility] over toilet>w/c.  Transported to/from gym in w/c. Ambulated 76ft using RW with mod assist for balance - pt continues to lack full R lateral weight shifting and poor L foot clearance lacking sufficient hip/knee flexion and ankle DF - manual facilitation for increased R lateral weight shifting to increase L foot clearance and step length - +2 w/c follow for safety. Squat pivot w/c>EOM with mod assist for pivoting and cuing for lifting hips more. Sit<>stand EOM<>RW with CGA/min assist for balance and cuing for L UE placement on/off AD. Standing with RW L LE NMR focusing on swing phase muscle activation/coordination/control to step foot forward/back on 2" progressed to 4" step with mirror feedback and max cuing for increased hip/knee flexion - pt demonstrates improving hip/knee flexion when stepping forward but when stepping backwards demonstrates huge compensation from hip hiking and  extension. Performed seated L LE step up/down on 4" step with manual facilitation for increased hip/knee flexion especially when stepping back and down. Repeated this in standing as described above with pt demonstrating slightly improving hip/knee flexion activation when stepping backwards. Squat pivot EOM>w/c with mod assist for pivoting and continued cuing for lifting hips. Transported back to room. Sit<>stand using stedy with CGA. Stedy transport to [facility] over toilet. Pt left sitting on BSC over toilet with stedy in place, nursing staff aware, and call light in pt's hand.  Therapy Documentation Precautions:  Precautions Precautions: Fall Precaution Comments: left neglect Restrictions Weight Bearing Restrictions: No  Pain: No complaints of knee pain during session.   Therapy/Group: Individual Therapy  Tawana Scale, PT, DPT 12/14/2018, 7:50 AM

## 2018-12-15 ENCOUNTER — Telehealth: Payer: Self-pay

## 2018-12-15 ENCOUNTER — Inpatient Hospital Stay (HOSPITAL_COMMUNITY): Payer: Medicare Other | Admitting: *Deleted

## 2018-12-15 ENCOUNTER — Inpatient Hospital Stay (HOSPITAL_COMMUNITY): Payer: Medicare Other

## 2018-12-15 ENCOUNTER — Inpatient Hospital Stay (HOSPITAL_COMMUNITY): Payer: Medicare Other | Admitting: Occupational Therapy

## 2018-12-15 DIAGNOSIS — N319 Neuromuscular dysfunction of bladder, unspecified: Secondary | ICD-10-CM

## 2018-12-15 LAB — BASIC METABOLIC PANEL
Anion gap: 12 (ref 5–15)
BUN: 11 mg/dL (ref 8–23)
CO2: 22 mmol/L (ref 22–32)
Calcium: 9.1 mg/dL (ref 8.9–10.3)
Chloride: 103 mmol/L (ref 98–111)
Creatinine, Ser: 0.87 mg/dL (ref 0.44–1.00)
GFR calc Af Amer: >60 mL/min (ref >60)
GFR calc non Af Amer: >60 mL/min (ref >60)
Glucose, Bld: 121 mg/dL — ABNORMAL HIGH (ref 70–99)
Potassium: 4.4 mmol/L (ref 3.5–5.1)
Sodium: 137 mmol/L (ref 135–145)

## 2018-12-15 LAB — GLUCOSE, CAPILLARY
Glucose-Capillary: 115 mg/dL — ABNORMAL HIGH (ref 70–99)
Glucose-Capillary: 128 mg/dL — ABNORMAL HIGH (ref 70–99)
Glucose-Capillary: 117 mg/dL — ABNORMAL HIGH (ref 70–99)
Glucose-Capillary: 120 mg/dL — ABNORMAL HIGH (ref 70–99)

## 2018-12-15 NOTE — Progress Notes (Signed)
Speech Language Pathology Daily Session Note  Patient Details  Name: Deanna [name] MRN: [id number] Date of Birth: August 30, 1950  Today's Date: 12/15/2018 SLP Individual Time: 0805-0900 SLP Individual Time Calculation (min): 55 min  Short Term Goals: Week 2: SLP Short Term Goal 1 (Week 2): Pt will sustain attention to basic task for ~ 45 minute with Min A cues. SLP Short Term Goal 2 (Week 2): Pt will perform semi-complex familiar problem solving tasks related to ADLs with Mod A cues. SLP Short Term Goal 3 (Week 2): Pt will initiate basic familiar problem solving tasks in 8 out of 10 opportunities with Mod A cues. SLP Short Term Goal 4 (Week 2): Pt will scan to midline in 8 out of 10 opportunities with Min A cues.  Skilled Therapeutic Interventions: Skilled ST services focused on cognitive skills. Pt requested to use restroom, SLP assisted with stedy pt required supervision A verbal cues for problem solving and min for LUE awareness and returned to chair. SLP facilitated left scanning and task initiation during PO consumption and navigation of breakfast tray, pt demonstrated mod I. SLP also facilitated left scanning and attention in cancellation task of two letters, pt required supervision A verbal cues fading to mod I 1/2 way through task. Pt demonstrated overall 100% initation of requested actions and sustained attention in 45 minutes intervals.Pt was left in room with call bell within reach and chair alarm set. ST recommends to continue skilled ST services.      Pain Pain Assessment Pain Scale: 0-10 Pain Score: 0-No pain  Therapy/Group: Individual Therapy  Aziz Slape  [facility] 12/15/2018, 2:09 PM

## 2018-12-15 NOTE — Progress Notes (Signed)
[facility] PHYSICAL MEDICINE & REHABILITATION PROGRESS NOTE   Subjective/Complaints: Patient seen sitting up in her chair this morning.  She states she slept well overnight.  She states she feels much better this morning.  ROS: Denies CP, SOB, N/V/D  objective:   No results found. No results for input(s): WBC, HGB, HCT, PLT in the last 72 hours. Recent Labs    12/15/18 0650  NA 137  K 4.4  CL 103  CO2 22  GLUCOSE 121*  BUN 11  CREATININE 0.87  CALCIUM 9.1    Intake/Output Summary (Last 24 hours) at 12/15/2018 1053 Last data filed at 12/15/2018 0900 Gross per 24 hour  Intake 960 ml  Output -  Net 960 ml     Physical Exam: Vital Signs Blood pressure (!) 130/51, pulse 79, temperature 98.2 F (36.8 C), resp. rate 12, height 5\' 1"  (1.549 m), weight 107.2 kg, SpO2 95 %. Constitutional: No distress . Vital signs reviewed.  Morbid obesity. HENT: Cephalic.  Atraumatic. Eyes: EOMI.  No discharge. Cardiovascular: No JVD. Respiratory: Normal effort. GI: Non-distended. Musc: Mild lower extremity edema Neurologic: Alert Motor: RUE/RLE: Grossly 5/5 LUE: Shoulder abduction 2/5, elbow flexion/extension 4/5, handgrip 3/5 LLE: 4-/5 Psych: Normal mood.  Normal behavior.  Normal affect.  Assessment/Plan: 1. 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  Physiatrist is providing close team supervision and 24 hour management of active medical problems listed below.  Physiatrist and rehab team continue to assess barriers to discharge/monitor patient progress toward functional and medical goals  Care Tool:  Bathing    Body parts bathed by patient: Chest, Abdomen, Face, Right arm(UB only this am)   Body parts bathed by helper: Left arm     Bathing assist Assist Level: Minimal Assistance - Patient > 75%     Upper Body  Dressing/Undressing Upper body dressing   What is the patient wearing?: Pull over shirt, Bra    Upper body assist Assist Level: Moderate Assistance - Patient 50 - 74%    Lower Body Dressing/Undressing Lower body dressing      What is the patient wearing?: Pants, Incontinence brief     Lower body assist Assist for lower body dressing: Maximal Assistance - Patient 25 - 49%     Toileting Toileting    Toileting assist Assist for toileting: Maximal Assistance - Patient 25 - 49%     Transfers Chair/bed transfer  Transfers assist     Chair/bed transfer assist level: Moderate Assistance - Patient 50 - 74%     Locomotion Ambulation   Ambulation assist   Ambulation activity did not occur: Safety/medical concerns  Assist level: 2 helpers(w/c follow) Assistive device: Walker-rolling Max distance: 9ft   Walk 10 feet activity   Assist  Walk 10 feet activity did not occur: Safety/medical concerns  Assist level: 2 helpers(w/c follow) Assistive device: Walker-rolling   Walk 50 feet activity   Assist Walk 50 feet with 2 turns activity did not occur: Safety/medical concerns         Walk 150 feet activity   Assist Walk 150 feet activity did not occur: Safety/medical concerns         Walk 10 feet on uneven surface  activity   Assist Walk 10 feet on uneven surfaces activity did not occur: Safety/medical concerns         Wheelchair  Assist Will patient use wheelchair at discharge?: Yes Type of Wheelchair: Manual    Wheelchair assist level: Maximal Assistance - Patient 25 - 49% Max wheelchair distance: 97ft    Wheelchair 50 feet with 2 turns activity    Assist    Wheelchair 50 feet with 2 turns activity did not occur: Safety/medical concerns       Wheelchair 150 feet activity     Assist Wheelchair 150 feet activity did not occur: Safety/medical concerns        Medical Problem List and Plan: 1.Left hemiparesis,  dysphagia  and functional deficitssecondary to large right MCA infarct  Continue CIR 2. Antithrombotics: -DVT/anticoagulation:  Sequential compression devices, below kneeBilateral lower extremities DC'd   Left post tibial DVT extended to L popliteal vein, now felt to be acute with propagation, continue Eliquis no signs of bleeding  -antiplatelet therapy: Low dose ASA. 3.OA bilateral knees/Pain Management:tylenol prn.  Voltaren gel to bilateral knees.  4. Mood:LCSW to follow for evaluation and support. Patient "nightmare" may be more of a perceptual issue, having some intermittent recognition of the left upper extremity but sometimes has difficulty in recognizing whether this is her own limb or not -antipsychotic agents: N/A 5. Neuropsych: This patientis not fully capable of making decisions on onown behalf. 6. Skin/Wound Care:Routine pressure relief measures. 7. Fluids/Electrolytes/Nutrition:Monitor I/O.  8.HTN: Monitor BP.  Avoid hypoperfusion Vitals:   12/14/18 1932 12/15/18 0408  BP: (!) 139/55 (!) 130/51  Pulse: 84 79  Resp:  12  Temp:  98.2 F (36.8 C)  SpO2:  95%   Relatively stable on 8/3 9. UTI: R to Macrobid , no Allergies, continue amoxicillin until 8/3                     10. Prediabetes: Hgb A1c-5.9. CM diet.   CBGs slightly elevated on 8/3 11. Morbid obesity: Educate patient on CM/HH diet and importance of weight loss to help promote health and mobility.  12. Dyslipidemia: On Atorvastatin 13.  Neurogenic bladder  Cont flomax and urecholine  With frequency at times 14.  Constipation:  Improving  LOS: 13 days A FACE TO FACE EVALUATION WAS PERFORMED  Consandra Laske Lorie Phenix 12/15/2018, 10:53 AM

## 2018-12-15 NOTE — Telephone Encounter (Signed)

## 2018-12-15 NOTE — Telephone Encounter (Signed)
Patient's husband called back. Pt is currently admitted. Advised I will cancel her wound check scheduled for tomorrow, will request EP APP perform wound check this admission. Pt's husband in agreement with plan and denies questions at this time.

## 2018-12-15 NOTE — Progress Notes (Signed)
Physical Therapy Session Note  Patient Details  Name: Deanna [name] MRN: [id number] Date of Birth: 1950/12/24  Today's Date: 12/15/2018 PT Individual Time: 1305-1400 PT Individual Time Calculation (min): 55 min   Short Term Goals: Week 2:  PT Short Term Goal 1 (Week 2): Pt will ambulate x 30 ft with LRAD and mod assist PT Short Term Goal 2 (Week 2): Pt will perform sit<>stand with min assist and RW PT Short Term Goal 3 (Week 2): Pt will perform bed<>chair transfers with mod assist  Skilled Therapeutic Interventions/Progress Updates: Pt presented in recliner agreeable to therapy. Pt c/o pain at knees, Voltaren gel applied. Pt performed squat pivot transfer to w/c mod/maxA. Pt transported to rehab gym and performed squat pivot transfer modA to max. Performed STS x 3 for BLE strengthening and static balance. Pt participated in gait training x 33ft with RW with max multimodal cues for sequencing and max verbal cues for foot placement. Pt noted to significantly adduct LLE when advancing forward but was able to correct with verbal cues. Pt also required cues to avoid increased ankle eversion when placing L foot on ground. Pt performed same activity with shopping cart approx 42ft with improved foot placement and modA with tactile cues for decreasing L lateral lean. Pt also performed toe taps to target 2 x 5 with modA for maintaining erect posture. Pt then attempted w/c mobility via hemi technique x 20 ft however pt unable to consistently place RLE on ground due to w/c height. Pt transported remaining distance to room and pt requesting to use bathroom. PTA performed Stedy transfer to toilet with PTA performing clothing management total A. Pt left sitting on toilet holding call button with nsg and NT aware.      Therapy Documentation Precautions:  Precautions Precautions: Fall Precaution Comments: left neglect Restrictions Weight Bearing Restrictions: No General:   Vital Signs: Therapy Vitals Temp:  (!) 97.5 F (36.4 C) Temp Source: Oral Pulse Rate: 85 Resp: 18 BP: (!) 141/61 Patient Position (if appropriate): Sitting Oxygen Therapy SpO2: 97 % O2 Device: Room Air Pain: Pain Assessment Pain Score: 0-No pain    Therapy/Group: Individual Therapy  Whitaker Holderman  Elon Eoff, PTA  12/15/2018, 4:28 PM

## 2018-12-15 NOTE — Progress Notes (Signed)
Occupational Therapy Session Note  Patient Details  Name: Deanna [name] MRN: [id number] Date of Birth: [birth year]  Today's Date: 12/15/2018 OT Individual Time: 2446-2863 OT Individual Time Calculation (min): 73 min    Short Term Goals: Week 2:  OT Short Term Goal 1 (Week 2): Pt will use LUE to wash RUE with min A. OT Short Term Goal 2 (Week 2): Pt will don shirt with min A. OT Short Term Goal 3 (Week 2): Pt will maintain dynamic stand with min A while using R hand to pull pants over hips 50% of the way. OT Short Term Goal 4 (Week 2): Pt will complete toilet transfers with mod A. OT Short Term Goal 5 (Week 2): Pt will be able to find items in L visual  field with min cues.  Skilled Therapeutic Interventions/Progress Updates:    Patient seated in recliner, ready for therapy session.  Sit to stand in stedy from recliner, toilet, tub bench and w/c surfaces with CS.  Clothing management for toileting and after shower with max a.  Bathing shower level with mod a (panus limits reach) - she attempts to use left UE for task completion t/o adl.  Dressing completed at w/c level - mod A for OH shirt (max A for bra)  Max cues to orient shirt.  LB dressing and footwear max A/dependent.  Sit to stand at bed rail for clothing management after dressing with CS to maintain balance.  Grooming/oral care w/c level with set up.  Completed left UE functional manipulation task - she demonstrates good ROM of hand but significant difficulty with manipulation of objects.  Proximal left UE AROM/coordination tasks with min A and cues for position in space.  SPT to recliner with mod a - she is seated in recliner with seat belt alarm set and call bell in reach at close of session.    Therapy Documentation Precautions:  Precautions Precautions: Fall Precaution Comments: left neglect Restrictions Weight Bearing Restrictions: No General:   Vital Signs:  Pain: Pain Assessment Pain Scale: 0-10 Pain Score: 0-No pain    Other Treatments:     Therapy/Group: Individual Therapy  Carlos Levering 12/15/2018, 11:42 AM

## 2018-12-16 ENCOUNTER — Inpatient Hospital Stay (HOSPITAL_COMMUNITY): Payer: Medicare Other

## 2018-12-16 ENCOUNTER — Inpatient Hospital Stay (HOSPITAL_COMMUNITY): Payer: Medicare Other | Admitting: Physical Therapy

## 2018-12-16 ENCOUNTER — Inpatient Hospital Stay (HOSPITAL_COMMUNITY): Payer: Medicare Other | Admitting: Occupational Therapy

## 2018-12-16 ENCOUNTER — Ambulatory Visit: Payer: Medicare Other

## 2018-12-16 LAB — GLUCOSE, CAPILLARY
Glucose-Capillary: 129 mg/dL — ABNORMAL HIGH (ref 70–99)
Glucose-Capillary: 129 mg/dL — ABNORMAL HIGH (ref 70–99)
Glucose-Capillary: 122 mg/dL — ABNORMAL HIGH (ref 70–99)
Glucose-Capillary: 133 mg/dL — ABNORMAL HIGH (ref 70–99)

## 2018-12-16 NOTE — Progress Notes (Signed)
ILR wound check.   Wound well healed. No edema, bruising, or drainage.  Industry to check device later today and place paper copy in chart.  It is communicating and no arrhythmias have been noted thus far.    Legrand Como [id number] [place], PA-C 12/16/2018 12:41 PM

## 2018-12-16 NOTE — Progress Notes (Signed)
Speech Language Pathology Daily Session Note  Patient Details  Name: Deanna [name] MRN: [id number] Date of Birth: 1950-12-04  Today's Date: 12/16/2018 SLP Individual Time: 8416-6063 SLP Individual Time Calculation (min): 44 min  Short Term Goals: Week 2: SLP Short Term Goal 1 (Week 2): Pt will sustain attention to basic task for ~ 45 minute with Min A cues. SLP Short Term Goal 2 (Week 2): Pt will perform semi-complex familiar problem solving tasks related to ADLs with Mod A cues. SLP Short Term Goal 3 (Week 2): Pt will initiate basic familiar problem solving tasks in 8 out of 10 opportunities with Mod A cues. SLP Short Term Goal 4 (Week 2): Pt will scan to midline in 8 out of 10 opportunities with Min A cues.  Skilled Therapeutic Interventions: Skilled ST services focused on cognitive skills. SLP facilitated semi-complex problem solving and error awareness skills in calendar task, pt required mod A verbal cues. Pt demonstrated ability to scan left when reading, however demonstrated difficulty tracking and organizing information while reading. Pt expressed internal distractions (needing to use bathroom) impacting performance. SLP assisted pt to bathroom and pt urinated, returning to chair. Pt was left in room with call bell within reach and chair alarm set. ST recommends to continue skilled ST services.      Pain Pain Assessment Pain Scale: 0-10 Pain Score: 0-No pain  Therapy/Group: Individual Therapy  Layden Caterino  [facility] 12/16/2018, 4:19 PM

## 2018-12-16 NOTE — Progress Notes (Signed)
Physical Therapy Session Note  Patient Details  Name: Deanna [name] MRN: [id number] Date of Birth: 1951-03-22  Today's Date: 12/16/2018 PT Individual Time: 1108-1206 PT Individual Time Calculation (min): 58 min   Short Term Goals: Week 2:  PT Short Term Goal 1 (Week 2): Pt will ambulate x 30 ft with LRAD and mod assist PT Short Term Goal 2 (Week 2): Pt will perform sit<>stand with min assist and RW PT Short Term Goal 3 (Week 2): Pt will perform bed<>chair transfers with mod assist  Skilled Therapeutic Interventions/Progress Updates:    Pt received sitting in recliner reporting she has had another bad night and that the nurse noticed overnight that she had swelling located on the right and left side of her anterior neck - PA notified and cleared pt to participate in therapy. Pt also continues to report frequent urgency and urination requesting to be left on toilet at end of therapy session. Sit<>stands using stedy with CGA for steadying. Stedy transfer to w/c.  Transported to/from gym in w/c. Ambulated 31ft using RW (ACE wrapped pt's L hand to RW rather than using orthotic as pt demonstrates ability to fully grasp RW grip) with mod assist for balance due to L lateral lean and for AD management - max multimodal cuing for LE step sequencing, L LE placement, and manual facilitation for R lateral weight shift for improved L LE foot clearance during swing - does not demonstrates significant adduction and is able to abduct more when needed upon verbal command - +2 w/c follow for safety. Stand pivot w/c>EOM using RW with mod/max assist for balance and AD management - max multimodal cuing for sequencing of LE stepping and AD. Performed seated L LE NMR stepping forward/backwards on/off 2" step to external target focusing on increased hip/knee flexion activation with pt demonstrating ability to activate hamstring contraction when taking foot off step. Performed standing with B UE support on RW (again ACE wrapped  pt's L hand to RW as it often slips off) L LE NMR task of stepping forward/backwards on/off 2" step towards external targets with min assist for balance - mirror feedback, manual facilitation in L LE to increase hip/knee flexion especially when stepping backwards as pt demonstrates significant hip hiking and circumduction compensations. Performed standing balance task with varying UE support on RW focusing on L attention and L UE NMR to grasp a cup with L hand, transfer it to her R hand, and then hand it to therapist all with varying min/mod assist for balance. Stand pivot EOM>w/c using RW with mod assist for balance and max multimodal cuing for sequencing. Transported back to room and performed stedy transfer w/c>BSC over toilet with CGA for sit<>stands in stedy and total assist for LB clothing management. Pt left sitting on BSC over toilet with stedy in place, nursing staff aware, and call bell in pt's hand.    Therapy Documentation Precautions:  Precautions Precautions: Fall Precaution Comments: left neglect Restrictions Weight Bearing Restrictions: No  Pain:  Reports pain in R knee during standing L LE NMR task above rated as 4-5/10 provided seated rest break and reports pain dissipates.    Therapy/Group: Individual Therapy  Tawana Scale, PT, DPT 12/16/2018, 7:59 AM

## 2018-12-16 NOTE — Progress Notes (Signed)
[place] PHYSICAL MEDICINE & REHABILITATION PROGRESS NOTE   Subjective/Complaints: Patient seen sitting up in her chair this morning.  She states she had a terrible night.  She is very anxious and upset regarding issues with bowel/bladder and stopping.  Discussed left leg edema with nursing as well as issues overnight.  ROS: + Bowel/bladder dysfunction.  Denies CP, SOB, N/V/D  objective:   No results found. No results for input(s): WBC, HGB, HCT, PLT in the last 72 hours. Recent Labs    12/15/18 0650  NA 137  K 4.4  CL 103  CO2 22  GLUCOSE 121*  BUN 11  CREATININE 0.87  CALCIUM 9.1    Intake/Output Summary (Last 24 hours) at 12/16/2018 1102 Last data filed at 12/16/2018 0857 Gross per 24 hour  Intake 1558 ml  Output 500 ml  Net 1058 ml     Physical Exam: Vital Signs Blood pressure (!) 149/56, pulse 96, temperature 97.8 F (36.6 C), resp. rate 19, height 5\' 1"  (1.549 m), weight 107.2 kg, SpO2 98 %. Constitutional: No distress . Vital signs reviewed. HENT: Normocephalic.  Atraumatic. Eyes: EOMI. No discharge. Cardiovascular: No JVD. Respiratory: Normal effort.  No stridor. GI: Non-distended. Skin: Warm and dry.  Intact. Psych: Normal mood.  Normal behavior. Musc: Lower extremity and left neck edema. Neurologic: Alert Motor: RUE/RLE: Grossly 5/5 LUE: Shoulder abduction 2/5, elbow flexion/extension 4/5, handgrip 3/5 LLE: 4-/5 Psych: Normal mood.  Normal behavior.  Normal affect.  Assessment/Plan: 1. 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  Physiatrist is providing close team supervision and 24 hour management of active medical problems listed below.  Physiatrist and rehab team continue to assess barriers to discharge/monitor patient progress toward functional and medical goals  Care Tool:  Bathing     Body parts bathed by patient: Right arm, Left arm, Chest, Abdomen, Right upper leg, Left upper leg, Face   Body parts bathed by helper: Right arm, Front perineal area, Buttocks, Right lower leg, Left lower leg     Bathing assist Assist Level: Moderate Assistance - Patient 50 - 74%     Upper Body Dressing/Undressing Upper body dressing   What is the patient wearing?: Bra, Pull over shirt    Upper body assist Assist Level: Moderate Assistance - Patient 50 - 74%    Lower Body Dressing/Undressing Lower body dressing      What is the patient wearing?: Incontinence brief, Pants     Lower body assist Assist for lower body dressing: Maximal Assistance - Patient 25 - 49%     Toileting Toileting    Toileting assist Assist for toileting: Maximal Assistance - Patient 25 - 49%     Transfers Chair/bed transfer  Transfers assist     Chair/bed transfer assist level: Moderate Assistance - Patient 50 - 74%     Locomotion Ambulation   Ambulation assist   Ambulation activity did not occur: Safety/medical concerns  Assist level: 2 helpers(w/c follow) Assistive device: Walker-rolling Max distance: 64ft   Walk 10 feet activity   Assist  Walk 10 feet activity did not occur: Safety/medical concerns  Assist level: 2 helpers(w/c follow) Assistive device: Walker-rolling   Walk 50 feet activity   Assist Walk 50 feet with 2 turns activity did not occur: Safety/medical concerns         Walk 150 feet activity   Assist Walk 150  feet activity did not occur: Safety/medical concerns         Walk 10 feet on uneven surface  activity   Assist Walk 10 feet on uneven surfaces activity did not occur: Safety/medical concerns         Wheelchair     Assist Will patient use wheelchair at discharge?: Yes Type of Wheelchair: Manual    Wheelchair assist level: Maximal Assistance - Patient 25 - 49% Max wheelchair distance: 62ft    Wheelchair 50 feet with 2 turns  activity    Assist    Wheelchair 50 feet with 2 turns activity did not occur: Safety/medical concerns       Wheelchair 150 feet activity     Assist Wheelchair 150 feet activity did not occur: Safety/medical concerns        Medical Problem List and Plan: 1.Left hemiparesis, dysphagia  and functional deficitssecondary to large right MCA infarct  Continue CIR 2. Antithrombotics: -DVT/anticoagulation:  Sequential compression devices, below kneeBilateral lower extremities DC'd   Left post tibial DVT extended to L popliteal vein, now felt to be acute with propagation, continue Eliquis no signs of bleeding  -antiplatelet therapy: Low dose ASA. 3.OA bilateral knees/Pain Management:tylenol prn.  Voltaren gel to bilateral knees.  4. Mood:LCSW to follow for evaluation and support. Patient "nightmare" may be more of a perceptual issue, having some intermittent recognition of the left upper extremity but sometimes has difficulty in recognizing whether this is her own limb or not -antipsychotic agents: N/A 5. Neuropsych: This patientis not fully capable of making decisions on onown behalf. 6. Skin/Wound Care:Routine pressure relief measures. 7. Fluids/Electrolytes/Nutrition:Monitor I/O.  8.HTN: Monitor BP.  Avoid hypoperfusion Vitals:   12/15/18 2022 12/16/18 0356  BP: (!) 150/56 (!) 149/56  Pulse: 87 96  Resp:  19  Temp:  97.8 F (36.6 C)  SpO2: 94% 98%   Labile on 8/4, monitor for trend 9. UTI: R to Macrobid , no Allergies, continue amoxicillin until 8/3                     10. Prediabetes: Hgb A1c-5.9. CM diet.   Mildly elevated on 8/4 11. Morbid obesity: Educate patient on CM/HH diet and importance of weight loss to help promote health and mobility.  12. Dyslipidemia: On Atorvastatin 13.  Neurogenic bladder  Cont flomax and urecholine  With frequency at times 14.  Constipation:  Improving overall 15.  Left neck  edema  Nontender, edema left neck.  This appears to be a chronic problem having been treated with Benadryl and diuretics before.  Will order ultrasound.  LOS: 14 days A FACE TO FACE EVALUATION WAS PERFORMED  Deanna [name] Deanna [name] 12/16/2018, 11:02 AM

## 2018-12-16 NOTE — Progress Notes (Signed)
Occupational Therapy Session Note  Patient Details  Name: Deanna [name] MRN: [id number] Date of Birth: 1950/09/15  Today's Date: 12/16/2018 OT Individual Time: 2952-8413 & 1258-1340 OT Individual Time Calculation (min): 81 min & 42 min   Short Term Goals: Week 2:  OT Short Term Goal 1 (Week 2): Pt will use LUE to wash RUE with min A. OT Short Term Goal 2 (Week 2): Pt will don shirt with min A. OT Short Term Goal 3 (Week 2): Pt will maintain dynamic stand with min A while using R hand to pull pants over hips 50% of the way. OT Short Term Goal 4 (Week 2): Pt will complete toilet transfers with mod A. OT Short Term Goal 5 (Week 2): Pt will be able to find items in L visual  field with min cues.  Skilled Therapeutic Interventions/Progress Updates:    am session:  Patient upset and crying upon my arrival this am.  She denied immediate needs but took a few moments to talk through her frustration and calm down.  Nursing and medical staff aware of her concerns related to bowel and bladder.  Transfer to toilet from recliner via stedy where she was able to void.  Clothing management and hygiene max A.  Transfer toilet to w/c via stedy.  She is consistently able to stand in stedy with CGA this session.  LB dressing max A/dep seated in w/c.  Pants up with walker support with max A.  Grooming tasks at sink with set up assist.  Breakfast completed with set up assist.  Standing balance activities compelted with CGA and walker for support.  stedy used to assist her to the bathroom a second time where she was able to pass her urine again.  Returned to recliner at close of session with seat belt alarm set and call bell clipped to her shirt in reach.  Pm session:  Patient states that she is feeling better, but tired this afternoon.  SPT to/from recliner, w/c, mat table, toilet with mod A, cues for weight shift and safe technique. Completed UB motor control and reaching activities seated on mat, left hand dexterity  activities with mod cues for spatial awareness.  She is able to tolerate unsupported sitting during activity with CS for 20 minutes.  toileting without ability to void toward end of session - max A for clothing management holding rail for support.  Returned to recliner at close of session with seat belt alarm set and call bell clipped to shirt  Therapy Documentation Precautions:  Precautions Precautions: Fall Precaution Comments: left neglect Restrictions Weight Bearing Restrictions: No General:   Vital Signs: Therapy Vitals Temp: 98 F (36.7 C) Pulse Rate: 100 Resp: 18 BP: (!) 104/50 Patient Position (if appropriate): Sitting Oxygen Therapy SpO2: 98 % O2 Device: Room Air Pain: Pain Assessment Pain Scale: 0-10 Pain Score: 0-No pain   Other Treatments:     Therapy/Group: Individual Therapy  Carlos Levering 12/16/2018, 3:24 PM

## 2018-12-16 NOTE — Progress Notes (Signed)
Pt's daughter called the nurses station at 0300 frantic stating the pt called her because she could not get any nursing staff to get into the room to assist with toileting needs. Upon walking into the pts room, she was in the recliner where she asked to be with her soft touch attached to her shirt and in reach. Pt was anxious and needed to be toileted. At that time the pt stated she felt like she had fluid build up in left side of the neck. Pt stated that the mass was new last night but in the past she had a mass appear. Upon assessment the pt was in no respiratory distress nor pain. Marked the area in marker to measure and monitor.

## 2018-12-17 ENCOUNTER — Inpatient Hospital Stay (HOSPITAL_COMMUNITY): Payer: Medicare Other | Admitting: Occupational Therapy

## 2018-12-17 ENCOUNTER — Inpatient Hospital Stay (HOSPITAL_COMMUNITY): Payer: Medicare Other

## 2018-12-17 ENCOUNTER — Inpatient Hospital Stay (HOSPITAL_COMMUNITY): Payer: Medicare Other | Admitting: Speech Pathology

## 2018-12-17 DIAGNOSIS — I82442 Acute embolism and thrombosis of left tibial vein: Secondary | ICD-10-CM | POA: Insufficient documentation

## 2018-12-17 LAB — GLUCOSE, CAPILLARY
Glucose-Capillary: 119 mg/dL — ABNORMAL HIGH (ref 70–99)
Glucose-Capillary: 110 mg/dL — ABNORMAL HIGH (ref 70–99)
Glucose-Capillary: 149 mg/dL — ABNORMAL HIGH (ref 70–99)
Glucose-Capillary: 132 mg/dL — ABNORMAL HIGH (ref 70–99)

## 2018-12-17 MED ORDER — POLYETHYLENE GLYCOL 3350 17 G PO PACK
17.0000 g | PACK | Freq: Every day | ORAL | Status: DC
  Administered 2018-12-18 – 2018-12-26 (×9): 17 g via ORAL
  Filled 2018-12-17 (×9): qty 1

## 2018-12-17 NOTE — Progress Notes (Signed)
Occupational Therapy Session Note  Patient Details  Name: Deanna [name] MRN: [id number] Date of Birth: [birth year]  Today's Date: 12/17/2018 OT Individual Time: 1430-1545 OT Individual Time Calculation (min): 75 min    Short Term Goals: Week 2:  OT Short Term Goal 1 (Week 2): Pt will use LUE to wash RUE with min A. OT Short Term Goal 2 (Week 2): Pt will don shirt with min A. OT Short Term Goal 3 (Week 2): Pt will maintain dynamic stand with min A while using R hand to pull pants over hips 50% of the way. OT Short Term Goal 4 (Week 2): Pt will complete toilet transfers with mod A. OT Short Term Goal 5 (Week 2): Pt will be able to find items in L visual  field with min cues.  Skilled Therapeutic Interventions/Progress Updates:    Pt received in recliner, very verbose requiring cueing for redirection to task right away. Pt's husband present in room. Pt completed stand pivot transfer to w/c with mod A. Pt was transported to therapy gym and she completed another transfer to mat. Pt transitioned to supine and completed closed chain functional reaching with focus on LUE NMR. Pt transitioned back to sitting EOM with mod A. Pt completed standing level dynamic throwing/catching task with 50% accuracy, dominated by R hand mostly. Min A provided for balance support. Pt completed [facility] task with playing cards, reaching forward and to the L with min HOH to facilitate accuracy. Pt sat EOM and completed [facility] bimanually, placing 1/2 in beads into a pill box. Pt requiring min facilitation to reduce trunk compensation for L arm flexion. Pt returned to room and transferred to toilet with min A. Pt required max A for clothing management. Pt voided (+urine) and returned to w/c. Pt was left sitting in the recliner with chair alarm belt fastened and all needs met. Husband still present.   Therapy Documentation Precautions:  Precautions Precautions: Fall Precaution Comments: left neglect Restrictions Weight Bearing  Restrictions: No   Therapy/Group: Individual Therapy  Curtis Sites 12/17/2018, 3:09 PM

## 2018-12-17 NOTE — Progress Notes (Signed)
Speech Language Pathology Daily Session Note  Patient Details  Name: Deanna [name] MRN: [id number] Date of Birth: February 12, 1951  Today's Date: 12/17/2018 SLP Individual Time: 1130-1200 SLP Individual Time Calculation (min): 30 min  Short Term Goals: Week 2: SLP Short Term Goal 1 (Week 2): Pt will sustain attention to basic task for ~ 45 minute with Min A cues. SLP Short Term Goal 2 (Week 2): Pt will perform semi-complex familiar problem solving tasks related to ADLs with Mod A cues. SLP Short Term Goal 3 (Week 2): Pt will initiate basic familiar problem solving tasks in 8 out of 10 opportunities with Mod A cues. SLP Short Term Goal 4 (Week 2): Pt will scan to midline in 8 out of 10 opportunities with Min A cues.  Skilled Therapeutic Interventions:  Skilled treatment session focused on cognition goals. SLP facilitated session by providing complex visual sorting task. Pt required cues to monitor verbosity and internal distractions. She also benefited from knowing number of items to scan and sort by. This allowed her to effectively know when she was finished scanning. Pt with good progress overall. Pt left upright, lap belt alarm on and all needs within reach. Continue per current plan of care.        Pain    Therapy/Group: Individual Therapy  Deanna [name] 12/17/2018, 2:44 PM

## 2018-12-17 NOTE — Patient Care Conference (Signed)
Inpatient RehabilitationTeam Conference and Plan of Care Update Date: 12/17/2018   Time: 11:00 AM    Patient Name: Deanna [name]      Medical Record Number: [id number]  Date of Birth: [birth year] Sex: Female         Room/Bed: 4W16C/4W16C-01 Payor Info: Payor: MEDICARE / Plan: MEDICARE PART A AND B / Product Type: *No Product type* /    Admitting Diagnosis: 4. CVA 2 Team  RT. CVA; 22-24days  Admit Date/Time:  12/02/2018  3:54 PM Admission Comments: No comment available   Primary Diagnosis:  <principal problem not specified> Principal Problem: <principal problem not specified>  Patient Active Problem List   Diagnosis Date Noted  . Acute deep vein thrombosis (DVT) of tibial vein of left lower extremity ([place])   . Neurogenic bladder   . Essential hypertension   . Urinary frequency   . Slow transit constipation   . Labile blood pressure   . Acute deep vein thrombosis (DVT) of left tibial vein ([facility])   . Acute left hemiparesis ([place]) 12/02/2018  . Hyperlipemia 12/02/2018  . Prediabetes 12/02/2018  . Morbid obesity ([place]) 12/02/2018  . Chronic back pain 12/02/2018  . Urinary retention 12/02/2018  . Acute ischemic right MCA stroke ([place]) 12/02/2018  . Acute ischemic stroke ([place]) large R MCA and 2 L infarcts s/p tPA and mechanical thrombectomy 11/29/2018  . 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 S/P right knee arthroscopy 07/09/2017  . S/P knee surgery 01/10/2017    Expected Discharge Date: Expected Discharge Date: 12/27/18  Team Members Present: Physician leading conference: Dr. Delice Lesch Social Worker Present: Alfonse Alpers, LCSW;Jonetta Dagley Donata Clay, LCSW;Becky DuPree, LCSW Nurse Present: Dorthula Nettles, RN PT Present: Georjean Mode, PT OT Present: Clyda Greener, OT SLP Present: Stormy Fabian, SLP PPS Coordinator present : Gunnar Fusi, SLP     Current Status/Progress Goal Weekly Team Focus  Medical   Left hemiparesis, dysphagia, and functional deficits secondary to large  right MCA infarct  Improve mobility, cognition, neck edema, bowel/bladder function  See above   Bowel/Bladder   Con. of B/B with timed toileting  To remain con. of B/b with timed toileting.      Swallow/Nutrition/ Hydration   supervision with meals         ADL's   SPT mod A, UB bathing/dressing mod A, LB bathing/dressing max/dep.  improving use of left UE, visual spatial deficits  CG/min A transfer, min/mod A adl  funcitonal transfers, ADL training, left side and spatial awareness, LUE NMR   Mobility   mod assist bed mobility, CGA sit<>stands using RW, mod/max A stand pivot transfer with RW, mod assist 58ft gait using RW with +2 w/c follow  min assist  L LE NMR, pre-gait and gait training with LRAD, standing balance/trunk control, L attention   Communication   slurrred speech at times         Safety/Cognition/ Behavioral Observations  Mod A  Min A - semi-complex problem solving, Mod A emergent/selective attention  education, semi-complex problem solving, emergent awareness and selective attention   Pain   no c/o pain  remain free of pain.      Skin   ecchymosis on both arms.  to remain free of breakdown.       Rehab Goals Patient on target to meet rehab goals: Yes Rehab Goals Revised: none *See Care Plan and progress notes for long and short-term goals.     Barriers to Discharge  Current Status/Progress Possible Resolutions Date Resolved  Physician    Medical stability;Weight     See above  Therapies, monitor neck, optimize bowel/bladder meds      Nursing                  PT                    OT                  SLP                SW                Discharge Planning/Teaching Needs:  Pt to return to her home with her husband and three dtrs taking turns caring for pt.  Family education closer to d/c.   Team Discussion:  BP varies;  DVT - eliquis;  Monitor BS;  Need bowel program - MD to address.  Better empyting when up to [facility].  Mod - max b/d;  tfs @ mod assist.   Increased use of UE.  Goals at min, min/ mod assist.  CG with sit-stand;  Mod- max with tfs.  amb ~ 3' with mod assist - min goals.  Mod / max with cognition.  Revisions to Treatment Plan:  NA    Continued Need for Acute Rehabilitation Level of Care: The patient requires daily medical management by a physician with specialized training in physical medicine and rehabilitation for the following conditions: Daily direction of a multidisciplinary physical rehabilitation program to ensure safe treatment while eliciting the highest outcome that is of practical value to the patient.: Yes Daily medical management of patient stability for increased activity during participation in an intensive rehabilitation regime.: Yes Daily analysis of laboratory values and/or radiology reports with any subsequent need for medication adjustment of medical intervention for : Neurological problems;Other;Urological problems   I attest that I was present, lead the team conference, and concur with the assessment and plan of the team.   Lennart Pall 12/17/2018, 3:21 PM    Team conference was held via web/ teleconference due to [place] - 76

## 2018-12-17 NOTE — Progress Notes (Signed)
Occupational Therapy Session Note  Patient Details  Name: Deanna [name] MRN: [id number] Date of Birth: [birth year]  Today's Date: 12/17/2018 OT Individual Time: 8416-6063 OT Individual Time Calculation (min): 49 min    Short Term Goals: Week 2:  OT Short Term Goal 1 (Week 2): Pt will use LUE to wash RUE with min A. OT Short Term Goal 2 (Week 2): Pt will don shirt with min A. OT Short Term Goal 3 (Week 2): Pt will maintain dynamic stand with min A while using R hand to pull pants over hips 50% of the way. OT Short Term Goal 4 (Week 2): Pt will complete toilet transfers with mod A. OT Short Term Goal 5 (Week 2): Pt will be able to find items in L visual  field with min cues.  Skilled Therapeutic Interventions/Progress Updates:    patient seated in recliner, alert and in good spirits this am, states that she slept well and had a good night.  SPT to/from recliner, toilet, w/c with mod A - max cues for body position and left foot placement today tending to over step/scissor without awareness of lack of stability/balance.  Sit to stand CGA from all surfaces.  toileting with max A, sponge bath seated at sink with mod A, UB dressing mod A with max cues and multiple attempts to orient shirt.  LB dressing max/dep.  Clothing management standing with walker max/dep.  Patient remained seated in w/c at close of session with seat belt alarm set and callbell/tray table in reach  Therapy Documentation Precautions:  Precautions Precautions: Fall Precaution Comments: left neglect Restrictions Weight Bearing Restrictions: No General:   Vital Signs:  Pain: Pain Assessment Pain Scale: 0-10 Pain Score: 0-No pain   Other Treatments:     Therapy/Group: Individual Therapy  Carlos Levering 12/17/2018, 8:41 AM

## 2018-12-17 NOTE — Progress Notes (Signed)
Physical Therapy Session Note  Patient Details  Name: Deanna [name] MRN: [id number] Date of Birth: 07-13-1950  Today's Date: 12/17/2018 PT Individual Time: 0916-1020 PT Individual Time Calculation (min): 64 min   Short Term Goals: Week 2:  PT Short Term Goal 1 (Week 2): Pt will ambulate x 30 ft with LRAD and mod assist PT Short Term Goal 2 (Week 2): Pt will perform sit<>stand with min assist and RW PT Short Term Goal 3 (Week 2): Pt will perform bed<>chair transfers with mod assist  Skilled Therapeutic Interventions/Progress Updates: Pt presented in w/c agreeable to therapy. Pt stating feeling a bit better than yesterday and was able to get some sleep. Pt participated in w/c mobility 39f x 4 with mod/maxA due to difficulty coordinating RLE and RUE for hemi technique. Pt was able to propel with RLE in straight path with cues for "walking" R foot however unable to incorporate RUE despite max multimodal cues. Pt transported to rehab gym and performed stand pivot transfer to mat with heavy modA. Pt participated in standing balance activities including standing horseshoes with RW for forced use of LUE and then participated in ball toss without AD. Pt was able to maintain static balance with min tactile cues for improved wt shifting to R. Pt then participated in gait training x 285fwith RW and min/modA requiring max multimodal cues for sequencing. Pt demonstrated improved coordination for foot placement this session. Pt was able to initiate turn in hallway with mod multimodal cues. Pt then became notably distracted with voices in hallway. Pt then unable to coordination additional movement and required max cues to sit in w/c. Pt then indicated need for urinary urgency. Pt transported back to room and performed Stedy transfer w/c to toilet (+void), requiring total A for clothing management and peri care. [facility]. Pt left in recliner with  belt alarm on, call bell within reach and needs met.      Therapy Documentation Precautions:  Precautions Precautions: Fall Precaution Comments: left neglect Restrictions Weight Bearing Restrictions: No General: PT Amount of Missed Time (min): 11 Minutes Vital Signs:  Pain: Pain Assessment Pain Scale: 0-10 Pain Score: 0-No pain    Therapy/Group: Individual Therapy  Siani Utke  Sarie Stall, PTA  12/17/2018, 12:15 PM

## 2018-12-17 NOTE — Progress Notes (Signed)
[facility] PHYSICAL MEDICINE & REHABILITATION PROGRESS NOTE   Subjective/Complaints: Patient seen sitting up in her chair this morning.  She states she slept well overnight.  She states she feels much better.  She is apologetic by yesterday.  ROS: + Bowel/bladder dysfunction, improving.  Denies CP, SOB, N/V/D  objective:   US Soft Tissue Head & Neck (non-thyroid)  Result Date: 12/17/2018 CLINICAL DATA:  68 year old with bilateral neck edema. EXAM: ULTRASOUND OF HEAD/NECK SOFT TISSUES TECHNIQUE: Ultrasound examination of the head and neck soft tissues was performed in the area of clinical concern. COMPARISON:  Neck CTA 11/29/2018 FINDINGS: Small normal appearing lymph nodes on both sides of the neck. No large cystic or solid lesions. No significant subcutaneous edema. IMPRESSION: No focal abnormality in the neck soft tissues. Electronically Signed   By: Markus Daft M.D.   On: 12/17/2018 08:20   No results for input(s): WBC, HGB, HCT, PLT in the last 72 hours. Recent Labs    12/15/18 0650  NA 137  K 4.4  CL 103  CO2 22  GLUCOSE 121*  BUN 11  CREATININE 0.87  CALCIUM 9.1    Intake/Output Summary (Last 24 hours) at 12/17/2018 1317 Last data filed at 12/17/2018 1258 Gross per 24 hour  Intake 1169 ml  Output -  Net 1169 ml     Physical Exam: Vital Signs Blood pressure (!) 129/41, pulse 85, temperature 98.9 F (37.2 C), resp. rate 20, height 5\' 1"  (1.549 m), weight 107.2 kg, SpO2 95 %. Constitutional: No distress . Vital signs reviewed. HENT: Normocephalic.  Atraumatic. Eyes: EOMI. No discharge. Cardiovascular: No JVD. Respiratory: Normal effort.  No stridor. GI: Non-distended. Skin: Warm and dry.  Intact. Psych: Normal mood.  Normal behavior. Musc: Neck edema improving Neurologic: Alert Motor: RUE/RLE: Grossly 5/5 LUE: Shoulder abduction 2+/5, elbow flexion/extension 4/5, handgrip 3+/5 LLE: 4-/5, stable  Assessment/Plan: 1. 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  Physiatrist is providing close team supervision and 24 hour management of active medical problems listed below.  Physiatrist and rehab team continue to assess barriers to discharge/monitor patient progress toward functional and medical goals  Care Tool:  Bathing    Body parts bathed by patient: Right arm, Left arm, Chest, Abdomen, Right upper leg, Left upper leg, Face   Body parts bathed by helper: Right arm, Front perineal area, Buttocks, Right lower leg, Left lower leg     Bathing assist Assist Level: Moderate Assistance - Patient 50 - 74%     Upper Body Dressing/Undressing Upper body dressing   What is the patient wearing?: Bra, Pull over shirt    Upper body assist Assist Level: Moderate Assistance - Patient 50 - 74%    Lower Body Dressing/Undressing Lower body dressing      What is the patient wearing?: Pants, Incontinence brief     Lower body assist Assist for lower body dressing: Total Assistance - Patient < 25%     Toileting Toileting    Toileting assist Assist for toileting: Maximal Assistance - Patient 25 - 49%     Transfers Chair/bed transfer  Transfers assist     Chair/bed transfer assist level: Moderate Assistance - Patient 50 - 74%     Locomotion Ambulation   Ambulation assist   Ambulation activity did not occur: Safety/medical concerns  Assist level: 2 helpers(w/c follow) Assistive device: Walker-rolling Max distance:  50ft   Walk 10 feet activity   Assist  Walk 10 feet activity did not occur: Safety/medical concerns  Assist level: 2 helpers(w/c follow) Assistive device: Walker-rolling   Walk 50 feet activity   Assist Walk 50 feet with 2 turns activity did not occur: Safety/medical concerns         Walk 150 feet activity   Assist Walk 150 feet activity did not  occur: Safety/medical concerns         Walk 10 feet on uneven surface  activity   Assist Walk 10 feet on uneven surfaces activity did not occur: Safety/medical concerns         Wheelchair     Assist Will patient use wheelchair at discharge?: Yes Type of Wheelchair: Manual    Wheelchair assist level: Maximal Assistance - Patient 25 - 49% Max wheelchair distance: 13ft    Wheelchair 50 feet with 2 turns activity    Assist    Wheelchair 50 feet with 2 turns activity did not occur: Safety/medical concerns       Wheelchair 150 feet activity     Assist Wheelchair 150 feet activity did not occur: Safety/medical concerns        Medical Problem List and Plan: 1.Left hemiparesis, dysphagia  and functional deficitssecondary to large right MCA infarct  Continue CIR  Team conference today to discuss current and goals and coordination of care, home and environmental barriers, and discharge planning with nursing, case manager, and therapies.  2. Antithrombotics: -DVT/anticoagulation:  Sequential compression devices, below kneeBilateral lower extremities DC'd   Left post tibial DVT extended to L popliteal vein, now felt to be acute with propagation, continue Eliquis no signs of bleeding  -antiplatelet therapy: Low dose ASA. 3.OA bilateral knees/Pain Management:tylenol prn.  Voltaren gel to bilateral knees.  4. Mood:LCSW to follow for evaluation and support. Patient "nightmare" may be more of a perceptual issue, having some intermittent recognition of the left upper extremity but sometimes has difficulty in recognizing whether this is her own limb or not -antipsychotic agents: N/A 5. Neuropsych: This patientis not fully capable of making decisions on onown behalf. 6. Skin/Wound Care:Routine pressure relief measures. 7. Fluids/Electrolytes/Nutrition:Monitor I/O.  8.HTN: Monitor BP.  Avoid hypoperfusion Vitals:   12/16/18  2155 12/17/18 0437  BP: (!) 125/54 (!) 129/41  Pulse: 93 85  Resp: 18 20  Temp: 98.5 F (36.9 C) 98.9 F (37.2 C)  SpO2: 96% 95%   Relatively stable on 8/5 9. UTI: R to Macrobid , no Allergies, continue amoxicillin until 8/3                     10. Prediabetes: Hgb A1c-5.9. CM diet.   Mildly elevated on 8/5 11. Morbid obesity: Educate patient on CM/HH diet and importance of weight loss to help promote health and mobility.  12. Dyslipidemia: On Atorvastatin 13.  Neurogenic bladder  Cont flomax and urecholine  With frequency at times  Overall improving 14.  Constipation:  Bowel meds increased on 8/5 15.  Left neck edema: improving  Nontender, edema left neck.  This appears to be a chronic problem having been treated with Benadryl and diuretics before.  Ultrasound unremarkable  LOS: 15 days A FACE TO FACE EVALUATION WAS PERFORMED  Adison Reifsteck Lorie Phenix 12/17/2018, 1:17 PM

## 2018-12-18 ENCOUNTER — Inpatient Hospital Stay (HOSPITAL_COMMUNITY): Payer: Medicare Other | Admitting: Occupational Therapy

## 2018-12-18 ENCOUNTER — Inpatient Hospital Stay (HOSPITAL_COMMUNITY): Payer: Medicare Other | Admitting: Physical Therapy

## 2018-12-18 ENCOUNTER — Inpatient Hospital Stay (HOSPITAL_COMMUNITY): Payer: Medicare Other | Admitting: Speech Pathology

## 2018-12-18 LAB — GLUCOSE, CAPILLARY
Glucose-Capillary: 139 mg/dL — ABNORMAL HIGH (ref 70–99)
Glucose-Capillary: 144 mg/dL — ABNORMAL HIGH (ref 70–99)
Glucose-Capillary: 140 mg/dL — ABNORMAL HIGH (ref 70–99)
Glucose-Capillary: 133 mg/dL — ABNORMAL HIGH (ref 70–99)

## 2018-12-18 MED ORDER — SENNOSIDES-DOCUSATE SODIUM 8.6-50 MG PO TABS
2.0000 | ORAL_TABLET | Freq: Every day | ORAL | Status: DC
  Administered 2018-12-18 – 2018-12-25 (×8): 2 via ORAL
  Filled 2018-12-18 (×8): qty 2

## 2018-12-18 NOTE — Progress Notes (Signed)
Speech Language Pathology Weekly Progress and Session Note  Patient Details  Name: Deanna [name] MRN: [id number] Date of Birth: 03-08-1951  Beginning of progress report period: December 09, 2018 End of progress report period: December 18, 2018  Today's Date: 12/18/2018 SLP Individual Time: 1245-1345 SLP Individual Time Calculation (min): 60 min  Short Term Goals: Week 2: SLP Short Term Goal 1 (Week 2): Pt will sustain attention to basic task for ~ 45 minute with Min A cues. SLP Short Term Goal 1 - Progress (Week 2): Met SLP Short Term Goal 2 (Week 2): Pt will perform semi-complex familiar problem solving tasks related to ADLs with Mod A cues. SLP Short Term Goal 2 - Progress (Week 2): Met SLP Short Term Goal 3 (Week 2): Pt will initiate basic familiar problem solving tasks in 8 out of 10 opportunities with Mod A cues. SLP Short Term Goal 3 - Progress (Week 2): Met SLP Short Term Goal 4 (Week 2): Pt will scan to midline in 8 out of 10 opportunities with Min A cues. SLP Short Term Goal 4 - Progress (Week 2): Met    New Short Term Goals: Week 3: SLP Short Term Goal 1 (Week 3): STGs = LTGS d/t remaining ELOS  Weekly Progress Updates: Pt has made good progress this reporting period and as a result she has met 4 of 4 STGs. She is currently able to sustain attention to tasks, engage in task initiation and problem solving. She has also demonstrated increased ability to scan to her left. Pt continues to benefit from skilled ST to address increased cognitive skills, increased functional independence and reduce caregiver burden.      Intensity: Minumum of 1-2 x/day, 30 to 90 minutes Frequency: 3 to 5 out of 7 days Duration/Length of Stay: 8/14 Treatment/Interventions: Cognitive remediation/compensation;Therapeutic Activities;Patient/family education;Functional tasks   Daily Session  Skilled Therapeutic Interventions: Skilled treatment session focused on cognition goals. SLP recieved pt in bathroom  and assisted with peri-care and transfer to recliner. SLP facilitiated cognitive goals by providing Min A cues for selective attention to task, intermittent superivison cues for scanning to left during set-up of lunch tray and Min A cues for semi-complex problem solving. Pt left upright in recliner, lap belt alarm on and all needs within reach. Continue per current plan of care.     General    Pain    Therapy/Group: Individual Therapy  Makyah Lavigne 12/18/2018, 2:12 PM

## 2018-12-18 NOTE — Progress Notes (Signed)
Occupational Therapy Session Note  Patient Details  Name: Deanna [name] MRN: [id number] Date of Birth: November 24, 1950  Today's Date: 12/18/2018 OT Individual Time: 1045-1200 OT Individual Time Calculation (min): 75 min    Short Term Goals: Week 2:  OT Short Term Goal 1 (Week 2): Pt will use LUE to wash RUE with min A. OT Short Term Goal 2 (Week 2): Pt will don shirt with min A. OT Short Term Goal 3 (Week 2): Pt will maintain dynamic stand with min A while using R hand to pull pants over hips 50% of the way. OT Short Term Goal 4 (Week 2): Pt will complete toilet transfers with mod A. OT Short Term Goal 5 (Week 2): Pt will be able to find items in L visual  field with min cues.  Skilled Therapeutic Interventions/Progress Updates:    Pt seen for OT ADL bathing/dressing session. Pt sitting up in recliner upon arrival, agreeable to tx session and denying pain. Pt requesting to shower this session. Used STEDY for functional transfer recliner> BSC over toilet> tub transfer bench in shower>w/c. Pt able to complete sit>stand into STEDY with min A and VCs for controlled movements. Required VCs to attention to placement of L UE during use of STEDY.  Toileting task completed with total A. Pt able to assist in pulling pants down while standing in steady with min A for balance.  She bathed seated on tub transfer bench in shower. She was able to use R UE at stabilizer level during set-up of task and used R UE to wash L UE with min A for achieving full ROM. Max A for LB bathing due to pt's poor sitting balance and body habitus limiting safety in her attempts to lean forward to wash.  She transitioned to w/c and completed oral care seated in w/c at sink. With slightly increased time pt able to locate all needed grooming items placed L of midline. She independently initiated use of L UE at stabilizer level during task.  She dressed seated in w/c. Required total A cuing for awareness to errors for UB clothing  orientation, due to pt's impulsivity not problem solving clothing direction before trying to don shirt and bra- max A overall for UB dressing. LB dressing completed total A. She completed sit>stand at sink with min A, able to maintain static standing balance at sink with close supervision while therapist donned incontinent brief and pants.  Neuro re-ed from w/c level completing functional reaching task, reaching across midline to obtain plastic cup and setting it on table. Required increased time and VCs for controlled movements with L UE and awareness to pressure applied to cup when grasping it.  Completed stand pivot transfer to recliner with mod A overall, max cuing for sequencing and RW management during turn. Pt left seated in recliner at end of session, chair belt alarm on and all needs in reach.   Therapy Documentation Precautions:  Precautions Precautions: Fall Precaution Comments: left neglect Restrictions Weight Bearing Restrictions: No   Therapy/Group: Individual Therapy  Deanna [name] L 12/18/2018, 7:04 AM

## 2018-12-18 NOTE — Progress Notes (Signed)
Physical Therapy Weekly Progress Note  Patient Details  Name: Deanna [name] MRN: [id number] Date of Birth: 1950-11-08  Beginning of progress report period: December 10, 2018 End of progress report period: December 18, 2018  Today's Date: 12/18/2018 PT Individual Time: 7425-9563 PT Individual Time Calculation (min): 62 min   Patient has met 3 of 3 short term goals. Patient is continuing to progress well with therapy demonstrating progression of transfers and gait training. Pt can now perform sit<>stands using RW with CGA, stand pivot transfers using RW with mod assist, and ambulate up to 2f using RW with mod assist. Patient continues to be limited by her L inattention, apraxia, poor L LE proprioceptive awareness, and poor trunk control.  Patient continues to demonstrate the following deficits muscle weakness, decreased cardiorespiratoy endurance, impaired timing and sequencing, abnormal tone, unbalanced muscle activation, motor apraxia, decreased coordination and decreased motor planning, decreased attention to left, decreased awareness and decreased problem solving and decreased standing balance, decreased postural control and decreased balance strategies and therefore will continue to benefit from skilled PT intervention to increase functional independence with mobility.  Patient progressing toward long term goals..  Continue plan of care.  PT Short Term Goals Week 2:  PT Short Term Goal 1 (Week 2): Pt will ambulate x 30 ft with LRAD and mod assist PT Short Term Goal 1 - Progress (Week 2): Met PT Short Term Goal 2 (Week 2): Pt will perform sit<>stand with min assist and RW PT Short Term Goal 2 - Progress (Week 2): Met PT Short Term Goal 3 (Week 2): Pt will perform bed<>chair transfers with mod assist PT Short Term Goal 3 - Progress (Week 2): Met Week 3:  PT Short Term Goal 1 (Week 3): = to LTGs based on ELOS  Skilled Therapeutic Interventions/Progress Updates:  Ambulation/gait  training;Balance/vestibular training;Cognitive remediation/compensation;Community reintegration;Discharge planning;Disease management/prevention;DME/adaptive equipment instruction;Functional electrical stimulation;Functional mobility training;Neuromuscular re-education;Pain management;Patient/family education;Psychosocial support;Splinting/orthotics;Stair training;Therapeutic Activities;Therapeutic Exercise;UE/LE Strength taining/ROM;UE/LE Coordination activities;Visual/perceptual remediation/compensation;Wheelchair propulsion/positioning  Upon therapist's arrival to pt's room RN present requesting to bladder scan patient prior to therapy. Pt seated in recliner and agreeable to therapy session but requesting to clean up and change prior to leaving the room. Sit<>stands in steady with supervision/CGA for steadying. Stedy transfer to sink. Performed face and upper body washing with set-up and min assist for cleanliness while sitting in stedy with close supervision for balance - max assist for UB dressing as pt continues to demonstrates L inattention and apraxia. Sit<>stand to/from w/c with CGA/min assist for balance without AD to complete LB dressing with total assist. Transported to/from gym in w/c. Sit<>stands using RW with CGA for steadying during session - pt is demonstrating recall/carryover of proper sequencing of UE placement on/off AD. Ambulated 780fusing RW, +2 w/c follow, with min/mod progressed to fully mod assist for balance with max/mod cuing for sequencing of LE stepping and AD management - with increased fatigue requires increased cuing for L LE placement during swing phase as her foot gets caught on RW. Stand pivot transfers x3 w/c<>EOM using RW with mod assist and pt demonstrating poor planning/sequencing of task requiring max step-by-step cuing - on final transfer, after pt sat and performed visual imagery of the task and it's sequencing she could then perform transfer with min/mod cuing. +2 max  assist to go from standing>tall kneeling on EOM with B UE support on bench. In tall kneeling mod assist for balance and manual facilitation/multimodal cuing for increased trunk/hip extension for upright posture - progressed to L  UE support on bench while reaching to grasp items with R UE - then pt reports a twisting discomfort in L knee and with heavy +2 max assist transferred back into w/c. Transported back to room in w/c and pt requesting timed toileting. Sit<>stand using stedy with CGA/supervision. Stedy transfer w/c>BSC over toilet. Pt left sitting on BSC over toilet with stedy in place, call bell in pt's hand, and nursing staff notified of pt's position.  Therapy Documentation Precautions:  Precautions Precautions: Fall Precaution Comments: left neglect Restrictions Weight Bearing Restrictions: No  Pain:   Reported L knee twisting/tweaking pain while in tall kneeling on EOM - repositioned and deferred performing any further activity in that position this session.   Therapy/Group: Individual Therapy  Tawana Scale, PT, DPT 12/18/2018, 7:50 AM

## 2018-12-18 NOTE — Progress Notes (Signed)
Social Work Patient ID: Deanna [name], female   DOB: [birth year], 68 y.o.   MRN: [id number]   CSW met with pt and her husband 12-17-18 to update them on team conference discussion and targeted d/c date of 12-27-18.  Husband stated that he prefers to just take her home after therapy is finished on 12-26-18.  CSW checked with team today and they are okay with this.  CSW shared this with husband today.  CSW also talked with him about family education schedule.  No other needs at this time, but next week CSW will begin making d/c arrangements.  CSW remains available to assist as needed.

## 2018-12-18 NOTE — Plan of Care (Signed)
  Problem: Consults Goal: RH STROKE PATIENT EDUCATION Description: See Patient Education module for education specifics  Outcome: Progressing Goal: Nutrition Consult-if indicated Outcome: Progressing   Problem: RH BOWEL ELIMINATION Goal: RH STG MANAGE BOWEL WITH ASSISTANCE Description: STG Manage Bowel with mod Assistance. Outcome: Progressing Goal: RH STG MANAGE BOWEL W/MEDICATION W/ASSISTANCE Description: STG Manage Bowel with Medication with min Assistance. Outcome: Progressing   Problem: RH BLADDER ELIMINATION Goal: RH STG MANAGE BLADDER WITH ASSISTANCE Description: STG Manage Bladder With mod Assistance Outcome: Progressing   Problem: RH SKIN INTEGRITY Goal: RH STG SKIN FREE OF INFECTION/BREAKDOWN Description: Skin to remain free from breakdown while on rehab with min assist Outcome: Progressing   Problem: RH SAFETY Goal: RH STG ADHERE TO SAFETY PRECAUTIONS W/ASSISTANCE/DEVICE Description: STG Adhere to Safety Precautions With mod Assistance and appropriate assistive Device. Outcome: Progressing Goal: RH STG DECREASED RISK OF FALL WITH ASSISTANCE Description: STG Decreased Risk of Fall With mod Assistance. Outcome: Progressing

## 2018-12-18 NOTE — Progress Notes (Signed)
[facility] PHYSICAL MEDICINE & REHABILITATION PROGRESS NOTE   Subjective/Complaints: Patient seen sitting up in bed this morning.  She states she slept fairly well overnight.  She notes constipation this morning.  She states she had a good day yesterday and is a little anxious that today has not started off as well.  ROS: + Bowel/bladder dysfunction, improving overall.  Denies CP, SOB, N/V/D  objective:   US Soft Tissue Head & Neck (non-thyroid)  Result Date: 12/17/2018 CLINICAL DATA:  68 year old with bilateral neck edema. EXAM: ULTRASOUND OF HEAD/NECK SOFT TISSUES TECHNIQUE: Ultrasound examination of the head and neck soft tissues was performed in the area of clinical concern. COMPARISON:  Neck CTA 11/29/2018 FINDINGS: Small normal appearing lymph nodes on both sides of the neck. No large cystic or solid lesions. No significant subcutaneous edema. IMPRESSION: No focal abnormality in the neck soft tissues. Electronically Signed   By: Markus Daft M.D.   On: 12/17/2018 08:20   No results for input(s): WBC, HGB, HCT, PLT in the last 72 hours. No results for input(s): NA, K, CL, CO2, GLUCOSE, BUN, CREATININE, CALCIUM in the last 72 hours.  Intake/Output Summary (Last 24 hours) at 12/18/2018 1531 Last data filed at 12/18/2018 1230 Gross per 24 hour  Intake 759 ml  Output 191 ml  Net 568 ml     Physical Exam: Vital Signs Blood pressure (!) 125/56, pulse 88, temperature 98 F (36.7 C), resp. rate 20, height 5\' 1"  (1.549 m), weight 107.2 kg, SpO2 95 %. Constitutional: No distress . Vital signs reviewed. HENT: Normocephalic.  Atraumatic. Eyes: EOMI. No discharge. Cardiovascular: No JVD. Respiratory: Normal effort.  No stridor. GI: Non-distended. Skin: Warm and dry.  Intact. Psych: Anxious Musc: Left neck edema improving Neurologic: Alert Motor: RUE/RLE: Grossly 5/5 LUE: Shoulder abduction 2+/5, elbow flexion/extension 4/5, handgrip 3+/5, stable LLE: 4-/5, stable  Assessment/Plan: 1.  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  Physiatrist is providing close team supervision and 24 hour management of active medical problems listed below.  Physiatrist and rehab team continue to assess barriers to discharge/monitor patient progress toward functional and medical goals  Care Tool:  Bathing    Body parts bathed by patient: Right arm, Left arm, Chest, Abdomen, Right upper leg, Left upper leg, Face, Front perineal area   Body parts bathed by helper: Right arm, Front perineal area, Buttocks, Right lower leg, Left lower leg     Bathing assist Assist Level: Moderate Assistance - Patient 50 - 74%     Upper Body Dressing/Undressing Upper body dressing   What is the patient wearing?: Bra, Pull over shirt    Upper body assist Assist Level: Maximal Assistance - Patient 25 - 49%    Lower Body Dressing/Undressing Lower body dressing      What is the patient wearing?: Pants, Incontinence brief     Lower body assist Assist for lower body dressing: Total Assistance - Patient < 25%     Toileting Toileting    Toileting assist Assist for toileting: Total Assistance - Patient < 25%     Transfers Chair/bed transfer  Transfers assist     Chair/bed transfer assist level: Moderate Assistance - Patient 50 - 74%     Locomotion Ambulation   Ambulation assist   Ambulation activity did not occur: Safety/medical concerns  Assist level: Moderate Assistance - Patient 50 - 74%  Assistive device: Walker-rolling Max distance: 46ft   Walk 10 feet activity   Assist  Walk 10 feet activity did not occur: Safety/medical concerns  Assist level: Moderate Assistance - Patient - 50 - 74% Assistive device: Walker-rolling   Walk 50 feet activity   Assist Walk 50 feet with 2 turns activity did not occur:  Safety/medical concerns  Assist level: Moderate Assistance - Patient - 50 - 74% Assistive device: Walker-rolling    Walk 150 feet activity   Assist Walk 150 feet activity did not occur: Safety/medical concerns         Walk 10 feet on uneven surface  activity   Assist Walk 10 feet on uneven surfaces activity did not occur: Safety/medical concerns         Wheelchair     Assist Will patient use wheelchair at discharge?: Yes Type of Wheelchair: Manual    Wheelchair assist level: Maximal Assistance - Patient 25 - 49% Max wheelchair distance: 65ft    Wheelchair 50 feet with 2 turns activity    Assist    Wheelchair 50 feet with 2 turns activity did not occur: Safety/medical concerns       Wheelchair 150 feet activity     Assist Wheelchair 150 feet activity did not occur: Safety/medical concerns        Medical Problem List and Plan: 1.Left hemiparesis, dysphagia  and functional deficitssecondary to large right MCA infarct  Continue CIR 2. Antithrombotics: -DVT/anticoagulation:  Sequential compression devices, below kneeBilateral lower extremities DC'd   Left post tibial DVT extended to L popliteal vein, now felt to be acute with propagation, continue Eliquis, no signs of bleeding  -antiplatelet therapy: Low dose ASA. 3.OA bilateral knees/Pain Management:tylenol prn.  Voltaren gel to bilateral knees.  4. Mood:LCSW to follow for evaluation and support. Patient "nightmare" may be more of a perceptual issue, having some intermittent recognition of the left upper extremity but sometimes has difficulty in recognizing whether this is her own limb or not -antipsychotic agents: N/A 5. Neuropsych: This patientis not fully capable of making decisions on onown behalf. 6. Skin/Wound Care:Routine pressure relief measures. 7. Fluids/Electrolytes/Nutrition:Monitor I/O.  8.HTN: Monitor BP.  Avoid hypoperfusion Vitals:    12/17/18 2015 12/18/18 0325  BP: (!) 123/55 (!) 125/56  Pulse: 84 88  Resp: 14 20  Temp: 98.5 F (36.9 C) 98 F (36.7 C)  SpO2: 95% 95%   Relatively stable on 8/6 9. UTI: R to Macrobid , no Allergies, continue amoxicillin until 8/3                     10. Prediabetes: Hgb A1c-5.9. CM diet.   Mildly elevated on 8/6 11. Morbid obesity: Educate patient on CM/HH diet and importance of weight loss to help promote health and mobility.  12. Dyslipidemia: On Atorvastatin 13.  Neurogenic bladder  Cont flomax and urecholine  With frequency at times  Overall improving 14.  Constipation:  Bowel meds increased on 8/5  Bowel regimen creased again on 8/6 15.  Left neck edema: improving  Nontender, edema left neck.  This appears to be a chronic problem having been treated with Benadryl and diuretics before.  Ultrasound unremarkable  LOS: 16 days A FACE TO FACE EVALUATION WAS PERFORMED  Ankit Lorie Phenix 12/18/2018, 3:31 PM

## 2018-12-19 ENCOUNTER — Inpatient Hospital Stay (HOSPITAL_COMMUNITY): Payer: Medicare Other | Admitting: Physical Therapy

## 2018-12-19 ENCOUNTER — Inpatient Hospital Stay (HOSPITAL_COMMUNITY): Payer: Medicare Other | Admitting: Speech Pathology

## 2018-12-19 ENCOUNTER — Inpatient Hospital Stay (HOSPITAL_COMMUNITY): Payer: Medicare Other | Admitting: Occupational Therapy

## 2018-12-19 DIAGNOSIS — R7309 Other abnormal glucose: Secondary | ICD-10-CM | POA: Insufficient documentation

## 2018-12-19 LAB — GLUCOSE, CAPILLARY
Glucose-Capillary: 103 mg/dL — ABNORMAL HIGH (ref 70–99)
Glucose-Capillary: 87 mg/dL (ref 70–99)
Glucose-Capillary: 138 mg/dL — ABNORMAL HIGH (ref 70–99)
Glucose-Capillary: 132 mg/dL — ABNORMAL HIGH (ref 70–99)

## 2018-12-19 NOTE — Progress Notes (Signed)
Occupational Therapy Weekly Progress Note  Patient Details  Name: Deanna [name] MRN: [id number] Date of Birth: March 07, 1951  Beginning of progress report period: 12/11/18 End of progress report period: 12/19/18  Today's Date: 12/19/2018 OT Individual Time: 3702-3017 OT Individual Time Calculation (min): 61 min    Patient has met 3 of 5 short term goals.  She is progressing in all functional areas  Patient continues to demonstrate the following deficits: impaired timing and sequencing, motor apraxia, decreased coordination and decreased motor planning, decreased attention to left and decreased motor planning, decreased attention and decreased problem solving and decreased standing balance, hemiplegia and decreased balance strategies and therefore will continue to benefit from skilled OT intervention to enhance overall performance with BADL and iADL.  Patient progressing toward long term goals..  Continue plan of care.  OT Short Term Goals Week 2:  OT Short Term Goal 1 (Week 2): Pt will use LUE to wash RUE with min A. OT Short Term Goal 1 - Progress (Week 2): Met OT Short Term Goal 2 (Week 2): Pt will don shirt with min A. OT Short Term Goal 2 - Progress (Week 2): Progressing toward goal OT Short Term Goal 3 (Week 2): Pt will maintain dynamic stand with min A while using R hand to pull pants over hips 50% of the way. OT Short Term Goal 3 - Progress (Week 2): Met OT Short Term Goal 4 (Week 2): Pt will complete toilet transfers with mod A. OT Short Term Goal 4 - Progress (Week 2): Met OT Short Term Goal 5 (Week 2): Pt will be able to find items in L visual  field with min cues. OT Short Term Goal 5 - Progress (Week 2): Progressing toward goal Week 3:  OT Short Term Goal 1 (Week 3): current goals = LTGs  Skilled Therapeutic Interventions/Progress Updates:    patient seated in recliner.  SPT with RW to/from recliner and w/c CGA mod cues for position in space.  Sit to stand in stedy CS - used stedy  for toilet and shower transfer.  Bathing in shower min A for bottom in stance and bilateral lower legs - assist to hold shower head due to difficulty maintaining direction into shower area.  Dressing completed seated in w/c.  UB dressing: mod A bra, mod A OH shirt with moderate cues for orientation.  LB dressing:  dependent for brief due to body habitus, mod/max A pants.  Clothing management: max A for brief, mod A for pants in stance with RW.  Grooming: set up.  Motor control improving on left side but continues with poor proprioception and visual spatial awareness.  She is seated in recliner with call bells, tray table in reach and seat belt alarm set.    Therapy Documentation Precautions:  Precautions Precautions: Fall Precaution Comments: left neglect Restrictions Weight Bearing Restrictions: No General:   Vital Signs:   Pain: Pain Assessment Pain Scale: 0-10 Pain Score: 0-No pain Other Treatments:     Therapy/Group: Individual Therapy  Carlos Levering 12/19/2018, 11:55 AM

## 2018-12-19 NOTE — Progress Notes (Signed)
Physical Therapy Session Note  Patient Details  Name: Deanna [name] MRN: [id number] Date of Birth: Jul 31, 1950  Today's Date: 12/19/2018 PT Individual Time: 0017-4944 PT Individual Time Calculation (min): 43 min   Short Term Goals: Week 3:  PT Short Term Goal 1 (Week 3): = to LTGs based on ELOS  Skilled Therapeutic Interventions/Progress Updates:   Pt received sitting in recliner and reports she is having a better day and is agreeable to therapy session. Sit<>stands using RW throughout session with min assist/CGA for lifting/balance with pt demonstrating carryover of proper set-up of transfer requiring only occasional min cuing. Stand pivot recliner>w/c using RW with mod assist for balance and only min cuing with pt demonstrating carryover of transfer training yesterday.  Transported to/from gym in w/c. Stand pivot w/c>EOM using RW with min/mod assist for balance and again only min cuing for these structured bed<>chair transfers during session. Standing balance with R UE support on RW while performing L UE NMR of reaching to grasp bean bags and toss them to cornhole board - min assist for balance. Ambulated ~48ft using RW with min/mod progressed to mod assist for balance with only min cuing for straight forward gait sequencing - upon turning to sit in w/c pt required increased cuing for this more complex transfer due to poor spatial awareness and L inattention. Ambulated ~32ft using RW through cone weaving with min/mod assist for balance and pt demonstrating ability to locate and turn RW adequately to avoid obstacles with min/mod cuing on sequencing. Performed B LE reciprocal movements using Kinetron while seated in w/c against minimal resistance to allow for increased ROM with min cuing on technique. Transported back to room in w/c and pt left in the care of nursing staff for toileting assist.  Therapy Documentation Precautions:  Precautions Precautions: Fall Precaution Comments: left  neglect Restrictions Weight Bearing Restrictions: No  Pain: Reports only 1 instance of L knee discomfort during cone weaving ambulation - provided seated rest break and no further issues reported.   Therapy/Group: Individual Therapy  Tawana Scale, PT, DPT 12/19/2018, 2:51 PM

## 2018-12-19 NOTE — Progress Notes (Signed)
Physical Therapy Session Note  Patient Details  Name: Pierre Cumpton MRN: [id number] Date of Birth: [birth year]  Today's Date: 12/19/2018 PT Individual Time: 0850-0915 PT Individual Time Calculation (min): 25 min   Short Term Goals: Week 3:  PT Short Term Goal 1 (Week 3): = to LTGs based on ELOS  Skilled Therapeutic Interventions/Progress Updates:    blocked practice of squat pivot transfer with pt unable to perform to Rt side despite max multimodal cuing, pt continues to stand pivot with mod A to Rt.  Squat pivot to left with pt able to perform with min/mod A and cues for UE/LE placement.  Standing NMR with focus on Rt LE strengthening with mini squats, pregait stepping all directions, wt shifts with reaching.  Pt requires manual facilitation for posture and pelvic alignment. Pt left with nurse tech present, needs at hand.  Therapy Documentation Precautions:  Precautions Precautions: Fall Precaution Comments: left neglect Restrictions Weight Bearing Restrictions: No Pain:  no c/o pain   Therapy/Group: Individual Therapy  Braxtyn Dorff 12/19/2018, 9:16 AM

## 2018-12-19 NOTE — Progress Notes (Signed)
[facility] PHYSICAL MEDICINE & REHABILITATION PROGRESS NOTE   Subjective/Complaints: Patient seen sitting up in a chair this morning.  She states she slept well overnight.  She states she feels "very, very, very good".  She notes improvement in bowel and bladder function and is hopeful to continue to progress.  ROS: Denies CP, SOB, N/V/D  objective:   No results found. No results for input(s): WBC, HGB, HCT, PLT in the last 72 hours. No results for input(s): NA, K, CL, CO2, GLUCOSE, BUN, CREATININE, CALCIUM in the last 72 hours.  Intake/Output Summary (Last 24 hours) at 12/19/2018 1228 Last data filed at 12/19/2018 0814 Gross per 24 hour  Intake 580 ml  Output -  Net 580 ml     Physical Exam: Vital Signs Blood pressure 118/64, pulse 80, temperature 98.2 F (36.8 C), resp. rate 18, height 5\' 1"  (1.549 m), weight 107.2 kg, SpO2 95 %. Constitutional: NAD.  Vital signs reviewed. HENT: Normocephalic.  Atraumatic. Eyes: EOMI. No discharge. Cardiovascular: No JVD. Respiratory: Normal effort.  No stridor. GI: Non--distended. Skin: Warm and dry.  Intact. Psych: Remains anxious Musc: Left neck edema stable Neurologic: Alert Motor:  LUE: Shoulder abduction 3-/5, elbow flexion/extension 4/5, handgrip 3+/5 LLE: 4-/5, improving  Assessment/Plan: 1. 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  Physiatrist is providing close team supervision and 24 hour management of active medical problems listed below.  Physiatrist and rehab team continue to assess barriers to discharge/monitor patient progress toward functional and medical goals  Care Tool:  Bathing    Body parts bathed by patient: Right arm, Left arm, Chest, Abdomen, Right upper leg, Left upper leg, Face, Front perineal area   Body parts bathed by helper: Buttocks, Right  lower leg, Left lower leg     Bathing assist Assist Level: Minimal Assistance - Patient > 75%     Upper Body Dressing/Undressing Upper body dressing   What is the patient wearing?: Pull over shirt, Bra    Upper body assist Assist Level: Moderate Assistance - Patient 50 - 74%    Lower Body Dressing/Undressing Lower body dressing      What is the patient wearing?: Pants, Incontinence brief     Lower body assist Assist for lower body dressing: Maximal Assistance - Patient 25 - 49%     Toileting Toileting    Toileting assist Assist for toileting: Maximal Assistance - Patient 25 - 49%     Transfers Chair/bed transfer  Transfers assist     Chair/bed transfer assist level: Moderate Assistance - Patient 50 - 74%     Locomotion Ambulation   Ambulation assist   Ambulation activity did not occur: Safety/medical concerns  Assist level: Moderate Assistance - Patient 50 - 74% Assistive device: Walker-rolling Max distance: 48ft   Walk 10 feet activity   Assist  Walk 10 feet activity did not occur: Safety/medical concerns  Assist level: Moderate Assistance - Patient - 50 - 74% Assistive device: Walker-rolling   Walk 50 feet activity   Assist Walk 50 feet with 2 turns activity did not occur: Safety/medical concerns  Assist level: Moderate Assistance - Patient - 50 - 74% Assistive device: Walker-rolling    Walk 150 feet activity   Assist Walk 150 feet activity did not occur: Safety/medical concerns         Walk 10 feet on uneven surface  activity  Assist Walk 10 feet on uneven surfaces activity did not occur: Safety/medical concerns         Wheelchair     Assist Will patient use wheelchair at discharge?: Yes Type of Wheelchair: Manual    Wheelchair assist level: Maximal Assistance - Patient 25 - 49% Max wheelchair distance: 61ft    Wheelchair 50 feet with 2 turns activity    Assist    Wheelchair 50 feet with 2 turns activity  did not occur: Safety/medical concerns       Wheelchair 150 feet activity     Assist Wheelchair 150 feet activity did not occur: Safety/medical concerns        Medical Problem List and Plan: 1.Left hemiparesis, dysphagia  and functional deficitssecondary to large right MCA infarct  Continue CIR 2. Antithrombotics: -DVT/anticoagulation:  Sequential compression devices, below kneeBilateral lower extremities DC'd   Left post tibial DVT extended to L popliteal vein, now felt to be acute with propagation, continue Eliquis, no signs of bleeding    CBC ordered for Monday  -antiplatelet therapy: Low dose ASA. 3.OA bilateral knees/Pain Management:tylenol prn.  Voltaren gel to bilateral knees.  4. Mood:LCSW to follow for evaluation and support.  -antipsychotic agents: N/A 5. Neuropsych: This patientis not fully capable of making decisions on onown behalf. 6. Skin/Wound Care:Routine pressure relief measures. 7. Fluids/Electrolytes/Nutrition:Monitor I/O.   BMP ordered for Monday  8.HTN: Monitor BP.  Avoid hypoperfusion Vitals:   12/18/18 2008 12/19/18 0325  BP: (!) 134/40 118/64  Pulse: 89 80  Resp: 19 18  Temp: 98.2 F (36.8 C) 98.2 F (36.8 C)  SpO2: 95% 95%   Labile on 9. UTI: R to Macrobid , no Allergies, continue amoxicillin until 8/3                     10. Prediabetes with hyperglycemia: Hgb A1c-5.9. CM diet.   Labile on 8/7 11. Morbid obesity: Educate patient on CM/HH diet and importance of weight loss to help promote health and mobility.  12. Dyslipidemia: On Atorvastatin 13.  Neurogenic bladder  Cont flomax and urecholine  With frequency at times  Improving 14.  Constipation:  Bowel meds increased on 8/5, increased again on 8/6  Improving with medications 15.  Left neck edema: Stable-likely muscle strain  Nontender, edema left neck.  This appears to be a chronic problem having been treated with Benadryl and diuretics  before.  Ultrasound unremarkable  LOS: 17 days A FACE TO FACE EVALUATION WAS PERFORMED  Deanna [name] 12/19/2018, 12:28 PM

## 2018-12-19 NOTE — Plan of Care (Signed)
  Problem: Consults Goal: RH STROKE PATIENT EDUCATION Description: See Patient Education module for education specifics  Outcome: Progressing Goal: Nutrition Consult-if indicated Outcome: Progressing   Problem: RH BOWEL ELIMINATION Goal: RH STG MANAGE BOWEL WITH ASSISTANCE Description: STG Manage Bowel with mod Assistance. Outcome: Progressing Goal: RH STG MANAGE BOWEL W/MEDICATION W/ASSISTANCE Description: STG Manage Bowel with Medication with min Assistance. Outcome: Progressing   Problem: RH BLADDER ELIMINATION Goal: RH STG MANAGE BLADDER WITH ASSISTANCE Description: STG Manage Bladder With mod Assistance Outcome: Progressing   Problem: RH SKIN INTEGRITY Goal: RH STG SKIN FREE OF INFECTION/BREAKDOWN Description: Skin to remain free from breakdown while on rehab with min assist Outcome: Progressing   Problem: RH SAFETY Goal: RH STG ADHERE TO SAFETY PRECAUTIONS W/ASSISTANCE/DEVICE Description: STG Adhere to Safety Precautions With mod Assistance and appropriate assistive Device. Outcome: Progressing Goal: RH STG DECREASED RISK OF FALL WITH ASSISTANCE Description: STG Decreased Risk of Fall With mod Assistance. Outcome: Progressing

## 2018-12-19 NOTE — Progress Notes (Signed)
Speech Language Pathology Daily Session Note  Patient Details  Name: Deanna [name] MRN: [id number] Date of Birth: 1950/10/01  Today's Date: 12/19/2018 SLP Individual Time: 0815-0900 SLP Individual Time Calculation (min): 45 min  Short Term Goals: Week 3: SLP Short Term Goal 1 (Week 3): STGs = LTGS d/t remaining ELOS  Skilled Therapeutic Interventions:  Skilled treatment session focused on cognition goals. SLP recieved pt upright in recliner chair. SLP facilitated session by providing pt with sorting task among complex visually distracting items. SLP gave pt initial instruction and left room briefly to assess pt's able to utilize working memory, selective attention and semi-complex problem solving. Pt independently executed task. SLP further facilitated session by providing more complex directions including alternating numbers and colors of task. With this increased complexity, pt efficiency declined and impulsivity/rate increased. Pt required Min A cues to slow rate and control impulsivity. Pt left upright in recliner chair, lap belt alarm on, all needs within reach and cell phone present. Continue per current plan of care.      Pain    Therapy/Group: Individual Therapy  Zayvon Alicea 12/19/2018, 10:17 AM

## 2018-12-19 NOTE — Progress Notes (Signed)
Occupational Therapy Session Note  Patient Details  Name: Deanna [name] MRN: [id number] Date of Birth: 10/24/1950  Today's Date: 12/19/2018 OT Individual Time: 4665-9935 OT Individual Time Calculation (min): 59 min    Short Term Goals: Week 3:  OT Short Term Goal 1 (Week 3): current goals = LTGs  Skilled Therapeutic Interventions/Progress Updates:    Pt completed use of the [employer]ision to start session.  Had pt work on Pensions consultant with scanning to the left in both sitting and standing using the Dynavision.  In standing with use of the RW and min assist she was able to average 3.5 and 3.7 seconds with use of the RUE primarily.  She was able to use the LUE in standing for 1 interval of 2 mins with an average time of 5-6 seconds.  Increased difficulty noted with coordination of the left hand when attempting to isolate index finger use to press the buttons.  In sitting she performed intervals with use of just the left and right lower segments of the board and was able to average less than 2 seconds using the RUE.  Finished work in sitting with use of the LUE for 98% of lights and the RUE for 2% on the far right that she could not reach in sitting.  She was able to locate and press 24/36 lights on a 5 second time limit.  Finished session with return back to the room where her spouse was present.  She was able to ambulate to the toilet with use of the RW and overall mod assist and mod demonstrational cueing to sequence walker and stepping for approximately fifteen feet.  When turning to sit on the toilet however she needed max assist secondary to LOB to the left as well as LLE buckling secondary to the position it was in underneath her.  Returned to wheelchair at end of session with pt left sitting up and safety belt in place and call button in reach.  Pt's spouse present for supervision as well.   Therapy Documentation Precautions:  Precautions Precautions: Fall Precaution Comments: left  neglect Restrictions Weight Bearing Restrictions: No  Pain: Pain Assessment Pain Scale: Faces Pain Score: 0-No pain ADL: See Care Tool Section for some details of ADL  Therapy/Group: Individual Therapy  Deolinda Frid OTR/L 12/19/2018, 4:32 PM

## 2018-12-20 LAB — GLUCOSE, CAPILLARY
Glucose-Capillary: 114 mg/dL — ABNORMAL HIGH (ref 70–99)
Glucose-Capillary: 103 mg/dL — ABNORMAL HIGH (ref 70–99)
Glucose-Capillary: 165 mg/dL — ABNORMAL HIGH (ref 70–99)
Glucose-Capillary: 118 mg/dL — ABNORMAL HIGH (ref 70–99)

## 2018-12-20 NOTE — Plan of Care (Signed)
  Problem: Consults Goal: RH STROKE PATIENT EDUCATION Description: See Patient Education module for education specifics  Outcome: Progressing Goal: Nutrition Consult-if indicated Outcome: Progressing   Problem: RH BOWEL ELIMINATION Goal: RH STG MANAGE BOWEL WITH ASSISTANCE Description: STG Manage Bowel with mod Assistance. Outcome: Progressing Goal: RH STG MANAGE BOWEL W/MEDICATION W/ASSISTANCE Description: STG Manage Bowel with Medication with min Assistance. Outcome: Progressing   Problem: RH BLADDER ELIMINATION Goal: RH STG MANAGE BLADDER WITH ASSISTANCE Description: STG Manage Bladder With mod Assistance Outcome: Progressing   Problem: RH SKIN INTEGRITY Goal: RH STG SKIN FREE OF INFECTION/BREAKDOWN Description: Skin to remain free from breakdown while on rehab with min assist Outcome: Progressing   Problem: RH SAFETY Goal: RH STG ADHERE TO SAFETY PRECAUTIONS W/ASSISTANCE/DEVICE Description: STG Adhere to Safety Precautions With mod Assistance and appropriate assistive Device. Outcome: Progressing Goal: RH STG DECREASED RISK OF FALL WITH ASSISTANCE Description: STG Decreased Risk of Fall With mod Assistance. Outcome: Progressing

## 2018-12-20 NOTE — Progress Notes (Signed)
[facility] PHYSICAL MEDICINE & REHABILITATION PROGRESS NOTE   Subjective/Complaints: No new issues. Denies pain. Happy with progress  ROS: Patient denies fever, rash, sore throat, blurred vision, nausea, vomiting, diarrhea, cough, shortness of breath or chest pain, joint or back pain, headache, or mood change.   objective:   No results found. No results for input(s): WBC, HGB, HCT, PLT in the last 72 hours. No results for input(s): NA, K, CL, CO2, GLUCOSE, BUN, CREATININE, CALCIUM in the last 72 hours.  Intake/Output Summary (Last 24 hours) at 12/20/2018 1046 Last data filed at 12/19/2018 1900 Gross per 24 hour  Intake 440 ml  Output -  Net 440 ml     Physical Exam: Vital Signs Blood pressure 120/63, pulse 81, temperature 98.9 F (37.2 C), temperature source Oral, resp. rate 14, height 5\' 1"  (1.549 m), weight 107.2 kg, SpO2 96 %. Constitutional: No distress . Vital signs reviewed. HEENT: EOMI, oral membranes moist Neck: supple Cardiovascular: RRR without murmur. No JVD    Respiratory: CTA Bilaterally without wheezes or rales. Normal effort    GI: BS +, non-tender, non-distended  Skin: Warm and dry.  Intact. Psych: Remains anxious Musc: Left neck edema stable Neurologic: Alert, good insight and awareness. Left central 7 Motor:  LUE: 3 to 3+/5 LLE: 4-/5  Assessment/Plan: 1. 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  Physiatrist is providing close team supervision and 24 hour management of active medical problems listed below.  Physiatrist and rehab team continue to assess barriers to discharge/monitor patient progress toward functional and medical goals  Care Tool:  Bathing    Body parts bathed by patient: Right arm, Left arm, Chest, Abdomen, Right upper leg, Left upper leg, Face, Front perineal area   Body  parts bathed by helper: Buttocks, Right lower leg, Left lower leg     Bathing assist Assist Level: Minimal Assistance - Patient > 75%     Upper Body Dressing/Undressing Upper body dressing   What is the patient wearing?: Pull over shirt, Bra    Upper body assist Assist Level: Moderate Assistance - Patient 50 - 74%    Lower Body Dressing/Undressing Lower body dressing      What is the patient wearing?: Pants, Incontinence brief     Lower body assist Assist for lower body dressing: Maximal Assistance - Patient 25 - 49%     Toileting Toileting    Toileting assist Assist for toileting: Maximal Assistance - Patient 25 - 49%     Transfers Chair/bed transfer  Transfers assist     Chair/bed transfer assist level: Moderate Assistance - Patient 50 - 74%     Locomotion Ambulation   Ambulation assist   Ambulation activity did not occur: Safety/medical concerns  Assist level: Moderate Assistance - Patient 50 - 74% Assistive device: Walker-rolling Max distance: 12'   Walk 10 feet activity   Assist  Walk 10 feet activity did not occur: Safety/medical concerns  Assist level: Moderate Assistance - Patient - 50 - 74% Assistive device: Walker-rolling   Walk 50 feet activity   Assist Walk 50 feet with 2 turns activity did not occur: Safety/medical concerns  Assist level: Moderate Assistance - Patient - 50 - 74% Assistive device: Walker-rolling    Walk 150 feet activity   Assist Walk 150 feet activity did not occur: Safety/medical concerns  Walk 10 feet on uneven surface  activity   Assist Walk 10 feet on uneven surfaces activity did not occur: Safety/medical concerns         Wheelchair     Assist Will patient use wheelchair at discharge?: Yes Type of Wheelchair: Manual    Wheelchair assist level: Maximal Assistance - Patient 25 - 49% Max wheelchair distance: 2ft    Wheelchair 50 feet with 2 turns activity    Assist     Wheelchair 50 feet with 2 turns activity did not occur: Safety/medical concerns       Wheelchair 150 feet activity     Assist Wheelchair 150 feet activity did not occur: Safety/medical concerns        Medical Problem List and Plan: 1.Left hemiparesis, dysphagia  and functional deficitssecondary to large right MCA infarct  Continue CIR PT, OT, SLP 2. Antithrombotics: -DVT/anticoagulation:  Sequential compression devices, below kneeBilateral lower extremities DC'd   Left post tibial DVT extended to L popliteal vein, now felt to be acute with propagation, continue Eliquis, no signs of bleeding    CBC ordered for Monday  -antiplatelet therapy: Low dose ASA. 3.OA bilateral knees/Pain Management:tylenol prn.  Voltaren gel to bilateral knees.  4. Mood:LCSW to follow for evaluation and support.  -antipsychotic agents: N/A 5. Neuropsych: This patientis not fully capable of making decisions on onown behalf. 6. Skin/Wound Care:Routine pressure relief measures. 7. Fluids/Electrolytes/Nutrition:Monitor I/O.   BMP ordered for Monday  8.HTN: Monitor BP.  Avoid hypoperfusion Vitals:   12/19/18 1946 12/20/18 0540  BP: 123/66 120/63  Pulse: 91 81  Resp: 14 14  Temp: 97.9 F (36.6 C) 98.9 F (37.2 C)  SpO2: 100% 96%   Controlled 8/8 9. UTI: R to Macrobid , no Allergies, continue amoxicillin until 8/3                     10. Prediabetes with hyperglycemia: Hgb A1c-5.9. CM diet.   Reasonable control 8/8   CBG (last 3)  Recent Labs    12/19/18 1642 12/19/18 2105 12/20/18 0622  GLUCAP 138* 132* 114*   11. Morbid obesity: Educate patient on CM/HH diet and importance of weight loss to help promote health and mobility.  12. Dyslipidemia: On Atorvastatin 13.  Neurogenic bladder  Cont flomax and urecholine  With frequency at times  Improved overall. Moving bowels has helped too 14.  Constipation:  Bowel meds increased on 8/5, increased  again on 8/6  Improving with medications  -had a good bm this am 15.  Left neck edema: Stable-likely muscle strain  Nontender, edema left neck.  This appears to be a chronic problem having been treated with Benadryl and diuretics before.  Ultrasound unremarkable  LOS: 18 days A FACE TO FACE EVALUATION WAS PERFORMED  Meredith Staggers 12/20/2018, 10:46 AM

## 2018-12-21 ENCOUNTER — Inpatient Hospital Stay (HOSPITAL_COMMUNITY): Payer: Medicare Other | Admitting: Occupational Therapy

## 2018-12-21 LAB — GLUCOSE, CAPILLARY
Glucose-Capillary: 108 mg/dL — ABNORMAL HIGH (ref 70–99)
Glucose-Capillary: 111 mg/dL — ABNORMAL HIGH (ref 70–99)
Glucose-Capillary: 141 mg/dL — ABNORMAL HIGH (ref 70–99)
Glucose-Capillary: 154 mg/dL — ABNORMAL HIGH (ref 70–99)

## 2018-12-21 NOTE — Plan of Care (Signed)
  Problem: Consults Goal: RH STROKE PATIENT EDUCATION Description: See Patient Education module for education specifics  Outcome: Progressing Goal: Nutrition Consult-if indicated Outcome: Progressing   Problem: RH BOWEL ELIMINATION Goal: RH STG MANAGE BOWEL WITH ASSISTANCE Description: STG Manage Bowel with mod Assistance. Outcome: Progressing Goal: RH STG MANAGE BOWEL W/MEDICATION W/ASSISTANCE Description: STG Manage Bowel with Medication with min Assistance. Outcome: Progressing   Problem: RH BLADDER ELIMINATION Goal: RH STG MANAGE BLADDER WITH ASSISTANCE Description: STG Manage Bladder With mod Assistance Outcome: Progressing   Problem: RH SKIN INTEGRITY Goal: RH STG SKIN FREE OF INFECTION/BREAKDOWN Description: Skin to remain free from breakdown while on rehab with min assist Outcome: Progressing   Problem: RH SAFETY Goal: RH STG ADHERE TO SAFETY PRECAUTIONS W/ASSISTANCE/DEVICE Description: STG Adhere to Safety Precautions With mod Assistance and appropriate assistive Device. Outcome: Progressing Goal: RH STG DECREASED RISK OF FALL WITH ASSISTANCE Description: STG Decreased Risk of Fall With mod Assistance. Outcome: Progressing

## 2018-12-21 NOTE — Progress Notes (Signed)
Occupational Therapy Session Note  Patient Details  Name: Deanna [name] MRN: [id number] Date of Birth: Dec 21, 1950  Today's Date: 12/21/2018 OT Individual Time: 7619-5093 OT Individual Time Calculation (min): 87 min   Skilled Therapeutic Interventions/Progress Updates:    Pt greeted in recliner with no c/o pain. Had just finished lunch and was ready to shower. Pt completed bathing (sitting on TTB, using lateral leans for perihygiene), dressing (w/c level sit<stand at sink), and oral care/hair combing (standing at sink) during session. Tx focus placed on Lt NMR, Lt attention, motor planning, and dynamic balance. Stedy used for shower transfers with pt completing sit<stand in device with supervision. During bathing, she needed Min A to include Lt to lather hair and wash Rt underarm. Assist also needed for washing feet and buttocks. Collaborative problem solving for correctly orienting clothing. She was able to fasten 2/4 bra clips correctly, but needed Mod A to thread UEs through the correct holes. Tried a few methods for donning overhead shirt, with pt exhibiting increased ease with threading head first, then L UE, and then R UE. She correctly oriented pants to start off, but would under/overshoot when attempting to thread LEs into pant holes due to perceptual deficits. Min A for sit<stand to elevate pants. Given increased time, vcs for Lt attention, 1 rest break, and encouragement, pt was able to lift pants fully over hips! Able to reach for items with Lt and use Lt functionally with Min A during oral care and hair combing. Noted difficultly with assessing amount of force needed to hold ADL items (I.e. crushing small bottle of water used for rinsing mouth and dropping toothpaste into sink a few times). Afterwards pt ambulated from the sink to her recliner. She required Mod A using RW to do this. Vcs for device mgt and spatial orientation when transferring onto surface. Pt was left in recliner with soft call  bell, safety belt fastened, and spouse present.   Therapy Documentation Precautions:  Precautions Precautions: Fall Precaution Comments: left neglect Restrictions Weight Bearing Restrictions: No ADL: ADL Eating: Minimal assistance Where Assessed-Eating: Bed level Grooming: Supervision/safety Where Assessed-Grooming: Sitting at sink Upper Body Bathing: Moderate assistance Where Assessed-Upper Body Bathing: Shower Lower Body Bathing: Maximal assistance Where Assessed-Lower Body Bathing: Shower Upper Body Dressing: Moderate assistance Where Assessed-Upper Body Dressing: Chair Lower Body Dressing: Maximal assistance Where Assessed-Lower Body Dressing: Wheelchair Toileting: Maximal assistance Where Assessed-Toileting: Glass blower/designer: Maximal Print production planner Method: Engineer, water: Energy manager: Moderate cueing, Maximal assistance Social research officer, government Method: Education officer, environmental: Radio broadcast assistant, Grab bars      Therapy/Group: Individual Therapy  Ember Gottwald A Anmol Paschen 12/21/2018, 4:02 PM

## 2018-12-21 NOTE — Progress Notes (Signed)
[facility] PHYSICAL MEDICINE & REHABILITATION PROGRESS NOTE   Subjective/Complaints: Up in chair. In good spirits. Happy with progress  ROS: Patient denies fever, rash, sore throat, blurred vision, nausea, vomiting, diarrhea, cough, shortness of breath or chest pain, joint or back pain, headache, or mood change.    objective:   No results found. No results for input(s): WBC, HGB, HCT, PLT in the last 72 hours. No results for input(s): NA, K, CL, CO2, GLUCOSE, BUN, CREATININE, CALCIUM in the last 72 hours.  Intake/Output Summary (Last 24 hours) at 12/21/2018 1015 Last data filed at 12/20/2018 1300 Gross per 24 hour  Intake 240 ml  Output -  Net 240 ml     Physical Exam: Vital Signs Blood pressure (!) 114/54, pulse 75, temperature 97.9 F (36.6 C), resp. rate 16, height 5\' 1"  (1.549 m), weight 107.2 kg, SpO2 93 %. Constitutional: No distress . Vital signs reviewed. HEENT: EOMI, oral membranes moist Neck: supple Cardiovascular: RRR without murmur. No JVD    Respiratory: CTA Bilaterally without wheezes or rales. Normal effort    GI: BS +, non-tender, non-distended   Skin: Warm and dry.  Intact. Psych: pleasant Musc: Left neck edema stable Neurologic: Alert, good insight and awareness. Left central 7 Motor:  LUE: 3 to 3+/5 LLE: 4-/5--no changes  Assessment/Plan: 1. 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  Physiatrist is providing close team supervision and 24 hour management of active medical problems listed below.  Physiatrist and rehab team continue to assess barriers to discharge/monitor patient progress toward functional and medical goals  Care Tool:  Bathing    Body parts bathed by patient: Right arm, Left arm, Chest, Abdomen, Right upper leg, Left upper leg, Face, Front perineal area   Body parts bathed  by helper: Buttocks, Right lower leg, Left lower leg     Bathing assist Assist Level: Minimal Assistance - Patient > 75%     Upper Body Dressing/Undressing Upper body dressing   What is the patient wearing?: Pull over shirt, Bra    Upper body assist Assist Level: Moderate Assistance - Patient 50 - 74%    Lower Body Dressing/Undressing Lower body dressing      What is the patient wearing?: Pants, Incontinence brief     Lower body assist Assist for lower body dressing: Maximal Assistance - Patient 25 - 49%     Toileting Toileting    Toileting assist Assist for toileting: Maximal Assistance - Patient 25 - 49%     Transfers Chair/bed transfer  Transfers assist     Chair/bed transfer assist level: Moderate Assistance - Patient 50 - 74%     Locomotion Ambulation   Ambulation assist   Ambulation activity did not occur: Safety/medical concerns  Assist level: Moderate Assistance - Patient 50 - 74% Assistive device: Walker-rolling Max distance: 12'   Walk 10 feet activity   Assist  Walk 10 feet activity did not occur: Safety/medical concerns  Assist level: Moderate Assistance - Patient - 50 - 74% Assistive device: Walker-rolling   Walk 50 feet activity   Assist Walk 50 feet with 2 turns activity did not occur: Safety/medical concerns  Assist level: Moderate Assistance - Patient - 50 - 74% Assistive device: Walker-rolling    Walk 150 feet activity   Assist Walk 150 feet activity did not occur: Safety/medical concerns  Walk 10 feet on uneven surface  activity   Assist Walk 10 feet on uneven surfaces activity did not occur: Safety/medical concerns         Wheelchair     Assist Will patient use wheelchair at discharge?: Yes Type of Wheelchair: Manual    Wheelchair assist level: Maximal Assistance - Patient 25 - 49% Max wheelchair distance: 68ft    Wheelchair 50 feet with 2 turns activity    Assist    Wheelchair 50  feet with 2 turns activity did not occur: Safety/medical concerns       Wheelchair 150 feet activity     Assist Wheelchair 150 feet activity did not occur: Safety/medical concerns        Medical Problem List and Plan: 1.Left hemiparesis, dysphagia  and functional deficitssecondary to large right MCA infarct  Continue CIR PT, OT, SLP 2. Antithrombotics: -DVT/anticoagulation:  Sequential compression devices, below kneeBilateral lower extremities DC'd   Left post tibial DVT extended to L popliteal vein, now felt to be acute with propagation, continue Eliquis, no signs of bleeding    CBC ordered for Monday  -antiplatelet therapy: Low dose ASA. 3.OA bilateral knees/Pain Management:tylenol prn.  Voltaren gel to bilateral knees.  4. Mood:LCSW to follow for evaluation and support.  -antipsychotic agents: N/A 5. Neuropsych: This patientis not fully capable of making decisions on onown behalf. 6. Skin/Wound Care:Routine pressure relief measures. 7. Fluids/Electrolytes/Nutrition:Monitor I/O.   BMP ordered for Monday  8.HTN: Monitor BP.  Avoid hypoperfusion Vitals:   12/21/18 0331 12/21/18 0335  BP: (!) 108/46 (!) 114/54  Pulse: 75 75  Resp: 16   Temp: 97.9 F (36.6 C)   SpO2: 93%    Controlled 8/9 9. UTI: R to Macrobid , no Allergies, continue amoxicillin until 8/3                     10. Prediabetes with hyperglycemia: Hgb A1c-5.9. CM diet.   Reasonable control 8/9   CBG (last 3)  Recent Labs    12/20/18 1721 12/20/18 2108 12/21/18 0605  GLUCAP 165* 118* 108*   11. Morbid obesity: Educate patient on CM/HH diet and importance of weight loss to help promote health and mobility.  12. Dyslipidemia: On Atorvastatin 13.  Neurogenic bladder  Cont flomax and urecholine  With frequency at times  Improved overall. Moving bowels has helped too 14.  Constipation:  Bowel meds increased on 8/5, increased again on 8/6  Improving with  medications  -had a good bm again this am 15.  Left neck edema: Stable-likely muscle strain  Nontender, edema left neck.  This appears to be a chronic problem having been treated with Benadryl and diuretics before.  Ultrasound unremarkable  LOS: 19 days A FACE TO FACE EVALUATION WAS PERFORMED  Meredith Staggers 12/21/2018, 10:15 AM

## 2018-12-22 ENCOUNTER — Inpatient Hospital Stay (HOSPITAL_COMMUNITY): Payer: Medicare Other

## 2018-12-22 ENCOUNTER — Inpatient Hospital Stay (HOSPITAL_COMMUNITY): Payer: Medicare Other | Admitting: Occupational Therapy

## 2018-12-22 LAB — BASIC METABOLIC PANEL
Anion gap: 12 (ref 5–15)
BUN: 12 mg/dL (ref 8–23)
CO2: 25 mmol/L (ref 22–32)
Calcium: 9.1 mg/dL (ref 8.9–10.3)
Chloride: 101 mmol/L (ref 98–111)
Creatinine, Ser: 1 mg/dL (ref 0.44–1.00)
GFR calc Af Amer: >60 mL/min (ref >60)
GFR calc non Af Amer: 57 mL/min — ABNORMAL LOW (ref >60)
Glucose, Bld: 180 mg/dL — ABNORMAL HIGH (ref 70–99)
Potassium: 3.9 mmol/L (ref 3.5–5.1)
Sodium: 138 mmol/L (ref 135–145)

## 2018-12-22 LAB — CBC
HCT: 40.4 % (ref 36.0–46.0)
Hemoglobin: 13.5 g/dL (ref 12.0–15.0)
MCH: 33.7 pg (ref 26.0–34.0)
MCHC: 33.4 g/dL (ref 30.0–36.0)
MCV: 100.7 fL — ABNORMAL HIGH (ref 80.0–100.0)
Platelets: 298 10*3/uL (ref 150–400)
RBC: 4.01 MIL/uL (ref 3.87–5.11)
RDW: 13 % (ref 11.5–15.5)
WBC: 5.9 10*3/uL (ref 4.0–10.5)
nRBC: 0 % (ref 0.0–0.2)

## 2018-12-22 LAB — GLUCOSE, CAPILLARY
Glucose-Capillary: 110 mg/dL — ABNORMAL HIGH (ref 70–99)
Glucose-Capillary: 134 mg/dL — ABNORMAL HIGH (ref 70–99)
Glucose-Capillary: 119 mg/dL — ABNORMAL HIGH (ref 70–99)

## 2018-12-22 NOTE — Progress Notes (Signed)
Occupational Therapy Session Note  Patient Details  Name: Deanna [name] MRN: [id number] Date of Birth: 01/23/1951  Today's Date: 12/22/2018 OT Individual Time: 1015-1105 OT Individual Time Calculation (min): 50 min    Short Term Goals: Week 3:  OT Short Term Goal 1 (Week 3): current goals = LTGs  Skilled Therapeutic Interventions/Progress Updates:    Pt seen for ADL training focusing on use of LUE and sit to stand skills. Pt needed to toilet and requested to shower.  Due to limited schedule time, used stedy for faster transfers to toilet and to shower bench.  Pt received in recliner and completed all stands with stedy with S.  Continues to need mod A with toileting and with bathing due to difficulty reaching to her back side. Mod A with UB dressing and max with LB dressing.  She is using her L arm more actively but continues to struggle with visual perception skills of orientation and depth perception.  She was able to sit to stand to RW with CGA and maintained stand balance with min A as she pulled pants over hips 50% of the way.     Pt resting in recliner with belt alarm on and all needs met.  Therapy Documentation Precautions:  Precautions Precautions: Fall Precaution Comments: left neglect Restrictions Weight Bearing Restrictions: No  Pain: Pain Assessment Pain Scale: 0-10 Pain Score: 0-No pain Faces Pain Scale: No hurt     Therapy/Group: Individual Therapy  [name] 12/22/2018, 12:24 PM

## 2018-12-22 NOTE — Progress Notes (Signed)
Occupational Therapy Session Note  Patient Details  Name: Deanna [name] MRN: [id number] Date of Birth: [birth year]  Today's Date: 12/22/2018 OT Individual Time: 0730-0800 OT Individual Time Calculation (min): 30 min  15 min missed d/t pt eating breakfast   Short Term Goals: Week 3:  OT Short Term Goal 1 (Week 3): current goals = LTGs  Skilled Therapeutic Interventions/Progress Updates:    Pt received in recliner waiting for breakfast. Pt c/o pain in B knees, unrated, described as soreness. No request for intervention as pt stated nursing would put gel on them once morning medication was delivered. Pt completed stand pivot transfer to w/c toward R side with cueing for hand placement and min A. Pt completed oral hygiene at the sink with (S). Pt transferred to [facility] over toilet with BUE support on grab bar, mod A for clothing management, min A for transfer. Pt unable to void urine as desired, but voided very small BM. Pt stood and required mod A for management/hygiene. Pt returned to chair. Pt left sitting up in recliner, requesting to eat breakfast that had been delivered. 15 min missed.   Therapy Documentation Precautions:  Precautions Precautions: Fall Precaution Comments: left neglect Restrictions Weight Bearing Restrictions: No   Therapy/Group: Individual Therapy  Curtis Sites 12/22/2018, 7:04 AM

## 2018-12-22 NOTE — Progress Notes (Signed)
[facility] PHYSICAL MEDICINE & REHABILITATION PROGRESS NOTE   Subjective/Complaints:  No neck pain  ROS: Patient deniesnausea, vomiting, diarrhea, cough, shortness of breath or chest pain, joint  + back pain but at baseline  objective:   No results found. No results for input(s): WBC, HGB, HCT, PLT in the last 72 hours. No results for input(s): NA, K, CL, CO2, GLUCOSE, BUN, CREATININE, CALCIUM in the last 72 hours.  Intake/Output Summary (Last 24 hours) at 12/22/2018 0834 Last data filed at 12/22/2018 0826 Gross per 24 hour  Intake 720 ml  Output -  Net 720 ml     Physical Exam: Vital Signs Blood pressure (!) 110/51, pulse 82, temperature 98.4 F (36.9 C), temperature source Oral, resp. rate 14, height 5\' 1"  (1.549 m), weight 107.2 kg, SpO2 94 %. Constitutional: No distress . Vital signs reviewed. HEENT: EOMI, oral membranes moist Neck: supple Cardiovascular: RRR without murmur. No JVD    Respiratory: CTA Bilaterally without wheezes or rales. Normal effort    GI: BS +, non-tender, non-distended   Skin: Warm and dry.  Intact. Psych: pleasant Musc: no pain with cervical ROM  Neurologic: Alert, good insight and awareness. Left central 7 Motor:  LUE: 3 to 3+/5 LLE: 4-/5--no changes  Assessment/Plan: 1. 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  Physiatrist is providing close team supervision and 24 hour management of active medical problems listed below.  Physiatrist and rehab team continue to assess barriers to discharge/monitor patient progress toward functional and medical goals  Care Tool:  Bathing    Body parts bathed by patient: Right arm, Left arm, Chest, Abdomen, Right upper leg, Left upper leg, Face, Front perineal area   Body parts bathed by helper: Buttocks, Right lower leg, Left lower leg      Bathing assist Assist Level: Minimal Assistance - Patient > 75%     Upper Body Dressing/Undressing Upper body dressing   What is the patient wearing?: Pull over shirt, Bra    Upper body assist Assist Level: Moderate Assistance - Patient 50 - 74%    Lower Body Dressing/Undressing Lower body dressing      What is the patient wearing?: Pants, Incontinence brief     Lower body assist Assist for lower body dressing: Maximal Assistance - Patient 25 - 49%     Toileting Toileting    Toileting assist Assist for toileting: Moderate Assistance - Patient 50 - 74%     Transfers Chair/bed transfer  Transfers assist     Chair/bed transfer assist level: Minimal Assistance - Patient > 75%     Locomotion Ambulation   Ambulation assist   Ambulation activity did not occur: Safety/medical concerns  Assist level: Moderate Assistance - Patient 50 - 74% Assistive device: Walker-rolling Max distance: 12'   Walk 10 feet activity   Assist  Walk 10 feet activity did not occur: Safety/medical concerns  Assist level: Moderate Assistance - Patient - 50 - 74% Assistive device: Walker-rolling   Walk 50 feet activity   Assist Walk 50 feet with 2 turns activity did not occur: Safety/medical concerns  Assist level: Moderate Assistance - Patient - 50 - 74% Assistive device: Walker-rolling    Walk 150 feet activity   Assist Walk 150 feet activity did not occur: Safety/medical concerns         Walk 10 feet on uneven surface  activity  Assist Walk 10 feet on uneven surfaces activity did not occur: Safety/medical concerns         Wheelchair     Assist Will patient use wheelchair at discharge?: Yes Type of Wheelchair: Manual    Wheelchair assist level: Maximal Assistance - Patient 25 - 49% Max wheelchair distance: 56ft    Wheelchair 50 feet with 2 turns activity    Assist    Wheelchair 50 feet with 2 turns activity did not occur: Safety/medical  concerns       Wheelchair 150 feet activity     Assist Wheelchair 150 feet activity did not occur: Safety/medical concerns        Medical Problem List and Plan: 1.Left hemiparesis, dysphagia  and functional deficitssecondary to large right MCA infarct  Continue CIR PT, OT, SLP 2. Antithrombotics: -DVT/anticoagulation:  Sequential compression devices, below kneeBilateral lower extremities DC'd   Left post tibial DVT extended to L popliteal vein, now felt to be acute with propagation, continue Eliquis, no signs of bleeding    CBC ordered for Monday  -antiplatelet therapy: Low dose ASA. 3.OA bilateral knees/Pain Management:tylenol prn.  Voltaren gel to bilateral knees. Chronic neck pain CT neck 7/20 C5-6, C6-7 DDD 4. Mood:LCSW to follow for evaluation and support.  -antipsychotic agents: N/A 5. Neuropsych: This patientis not fully capable of making decisions on onown behalf. 6. Skin/Wound Care:Routine pressure relief measures. 7. Fluids/Electrolytes/Nutrition:Monitor I/O.   BMP ordered for Monday  8.HTN: Monitor BP.  Avoid hypoperfusion Vitals:   12/21/18 2044 12/22/18 0332  BP: 123/65 (!) 110/51  Pulse: 91 82  Resp:  14  Temp: (!) 97.5 F (36.4 C) 98.4 F (36.9 C)  SpO2: 93% 94%   Controlled 8/10  9. UTI: R to Macrobid , no Allergies, continue amoxicillin until 8/3                     10. Prediabetes with hyperglycemia: Hgb A1c-5.9. CM diet.   Reasonable control 8/10    CBG (last 3)  Recent Labs    12/21/18 1702 12/21/18 2124 12/22/18 0622  GLUCAP 141* 154* 110*   11. Morbid obesity: Educate patient on CM/HH diet and importance of weight loss to help promote health and mobility.  12. Dyslipidemia: On Atorvastatin 13.  Neurogenic bladder  Cont flomax and urecholine  With frequency at times   14.  Constipation:   Improving with daily Miralax   15.  Left neck pain CT showing   LOS: 20 days A FACE TO FACE  EVALUATION WAS PERFORMED  Charlett Blake 12/22/2018, 8:34 AM

## 2018-12-22 NOTE — Progress Notes (Signed)
Physical Therapy Session Note  Patient Details  Name: Deanna [name] MRN: [id number] Date of Birth: 10-04-1950  Today's Date: 12/22/2018 PT Individual Time: 1600-1700 PT Individual Time Calculation (min): 60 min   Short Term Goals: Week 3:  PT Short Term Goal 1 (Week 3): = to LTGs based on ELOS  Skilled Therapeutic Interventions/Progress Updates:    Pt seated in recliner upon PT arrival, agreeable to therapy tx and denies pain. Pt's husband present, confirmed family training sessions this week including tomorrow with daughters and Thursday with him. Pt transferred from recliner>w/c with min assist and RW. Pt transported to the gym. Pt ambulated 2 x 50 ft this session with RW and min assist working on gait, cues for turn to sit and RW placement. Pt worked on standing balance without AD and L attention while performing horseshoe toss activity x 2 trials using L UE, CGA. Pt worked on L attention, standing balance and L UE neuro re-ed to perform peg board puzzle while standing at table, cues for techniques to complete puzzle and pt able to grasp pegs out of bucket with L hand but needed to use R hand to place in hole. Pt worked on L UE NMR to perform cup flipping activity while seated edge of mat, cues for slow controlled movement and attention. Pt transferred to w/c with min assist stand pivot RW. Pt transported to rehab apartment, pt worked on L attention and L UE neuro re-ed to perform reaching tasks while standing at the sink, opening/closing cabinets, reaching for cup and then bowl, filling cup with water, and putting fake fruit in bowl. Pt transported back to room and reports having to use bathroom. Pt performed stand pivot to toilet with min assist, min assist for standing balance while performing clothing management with min assist. Pt reports she needs a while in the bathroom, left in the bathroom in care of NT, call bell in reach.   Therapy Documentation Precautions:  Precautions Precautions:  Fall Precaution Comments: left neglect Restrictions Weight Bearing Restrictions: No   Therapy/Group: Individual Therapy  Netta Corrigan, PT, DPT 12/22/2018, 12:12 PM

## 2018-12-22 NOTE — Progress Notes (Signed)
Speech Language Pathology Daily Session Note  Patient Details  Name: Deanna [name] MRN: [id number] Date of Birth: Jul 20, 1950  Today's Date: 12/22/2018 SLP Individual Time: 1345-1415 and 1300-1315 SLP Individual Time Calculation (min): 30 min and 15 min   Short Term Goals: Week 3: SLP Short Term Goal 1 (Week 3): STGs = LTGS d/t remaining ELOS  Skilled Therapeutic Interventions:  1# Skilled ST services focused on cognitive skills. Pt agreed to participate during unscheduled time and then requested to use the bathroom. Pt demonstrated anticipatory awareness skills stating " I knew I had you at 1:45 so I wanted to go to the bathroom before you came." SLP assisted pt to bathroom via stedy pt required min A and returned to recliner chair. Pt was left in room with call bell within reach and chair alarm set. ST recommends to continue skilled ST services.   2# Skilled ST services focused on cognitive skills. SLP facilitated semi-complex problem solving and emergent awareness skills in novel PEG design tasks, pt required initial mod A verbal cues for planning of coordinate fading to supervision A verbal cues once the pattern was initated.  Pt was left in room with call bell within reach and chair alarm set. ST recommends to continue skilled ST services.      Pain Pain Assessment Pain Score: 0-No pain  Therapy/Group: Individual Therapy  Jerricka Carvey  [facility] 12/22/2018, 3:56 PM

## 2018-12-22 NOTE — Plan of Care (Signed)
  Problem: Consults Goal: RH STROKE PATIENT EDUCATION Description: See Patient Education module for education specifics  Outcome: Progressing Goal: Nutrition Consult-if indicated Outcome: Progressing   Problem: RH BOWEL ELIMINATION Goal: RH STG MANAGE BOWEL WITH ASSISTANCE Description: STG Manage Bowel with mod Assistance. Outcome: Progressing Goal: RH STG MANAGE BOWEL W/MEDICATION W/ASSISTANCE Description: STG Manage Bowel with Medication with min Assistance. Outcome: Progressing   Problem: RH BLADDER ELIMINATION Goal: RH STG MANAGE BLADDER WITH ASSISTANCE Description: STG Manage Bladder With mod Assistance Outcome: Progressing   Problem: RH SKIN INTEGRITY Goal: RH STG SKIN FREE OF INFECTION/BREAKDOWN Description: Skin to remain free from breakdown while on rehab with min assist Outcome: Progressing   Problem: RH SAFETY Goal: RH STG ADHERE TO SAFETY PRECAUTIONS W/ASSISTANCE/DEVICE Description: STG Adhere to Safety Precautions With mod Assistance and appropriate assistive Device. Outcome: Progressing Goal: RH STG DECREASED RISK OF FALL WITH ASSISTANCE Description: STG Decreased Risk of Fall With mod Assistance. Outcome: Progressing

## 2018-12-23 ENCOUNTER — Ambulatory Visit (HOSPITAL_COMMUNITY): Payer: Medicare Other | Admitting: Physical Therapy

## 2018-12-23 ENCOUNTER — Inpatient Hospital Stay (HOSPITAL_COMMUNITY): Payer: Medicare Other

## 2018-12-23 ENCOUNTER — Encounter (HOSPITAL_COMMUNITY): Payer: Medicare Other

## 2018-12-23 ENCOUNTER — Encounter (HOSPITAL_COMMUNITY): Payer: Medicare Other | Admitting: Occupational Therapy

## 2018-12-23 LAB — GLUCOSE, CAPILLARY
Glucose-Capillary: 108 mg/dL — ABNORMAL HIGH (ref 70–99)
Glucose-Capillary: 109 mg/dL — ABNORMAL HIGH (ref 70–99)

## 2018-12-23 NOTE — Progress Notes (Signed)
Occupational Therapy Session Note  Patient Details  Name: Deanna [name] MRN: [id number] Date of Birth: Feb 16, 1951  Today's Date: 12/23/2018 OT Individual Time: 4098-1191 OT Individual Time Calculation (min): 65 min    Short Term Goals: Week 3:  OT Short Term Goal 1 (Week 3): current goals = LTGs  Skilled Therapeutic Interventions/Progress Updates:    Pt seen this session for family education with her 2 daughters.  Daughters were able to observe and participate (hands on practice) with sit to stand to RW, stand pivot transfers with RW recliner to wc, w/c >< toilet with grab bars and w/c>< tub bench with bars. At home, pt will likely ambulate into bathroom (w/c will fit) but due to limited time, used w/c this session. Pt has a elevated toilet the same height as toilet in her room.  Pt did well sitting on regular toilet and it was easier for her to reach for self cleansing (vs sitting on BSC).  The walk in shower has a 4 in threshold. So recommended using a tub bench that will stick out of shower.  Demonstrated to daughters the actual set up and they provided measurements to ensure that she can access it. Explained and demonstrated visual scanning compensatory techniques with her L visual field cut, UE/LE dressing strategies to help with spatial relations.  Pt actively using LUE and was able to do more functional tasks with arm today. Pt resting in recliner and ready for speech therapy.    Therapy Documentation Precautions:  Precautions Precautions: Fall Precaution Comments: left neglect Restrictions Weight Bearing Restrictions: No Pain: Pain Assessment Pain Scale: 0-10 Pain Score: 7  Pain Type: Acute pain Pain Location: Shoulder Pain Orientation: Left Pain Radiating Towards: hand Pain Descriptors / Indicators: Aching Pain Frequency: Intermittent Pain Onset: Gradual Pain Intervention(s): Medication (See eMAR)       Therapy/Group: Individual Therapy  [place] 12/23/2018,  9:43 AM

## 2018-12-23 NOTE — Progress Notes (Signed)
[place] PHYSICAL MEDICINE & REHABILITATION PROGRESS NOTE   Subjective/Complaints:  8/10 labs reviewed  Pt slept ok except had left hand tingling in am, hx CTS but felt like entire are was tingling, no neck pain   ROS: Patient denies nausea, vomiting, diarrhea, cough, shortness of breath or chest pain, joint  + back pain but at baseline  objective:   No results found. Recent Labs    12/22/18 0917  WBC 5.9  HGB 13.5  HCT 40.4  PLT 298   Recent Labs    12/22/18 0917  NA 138  K 3.9  CL 101  CO2 25  GLUCOSE 180*  BUN 12  CREATININE 1.00  CALCIUM 9.1    Intake/Output Summary (Last 24 hours) at 12/23/2018 2956 Last data filed at 12/22/2018 1756 Gross per 24 hour  Intake 599 ml  Output 0 ml  Net 599 ml     Physical Exam: Vital Signs Blood pressure (!) 124/59, pulse 79, temperature 98 F (36.7 C), temperature source Oral, resp. rate 18, height 5\' 1"  (1.549 m), weight 107.2 kg, SpO2 94 %. Constitutional: No distress . Vital signs reviewed. HEENT: EOMI, oral membranes moist Neck: supple Cardiovascular: RRR without murmur. No JVD    Respiratory: CTA Bilaterally without wheezes or rales. Normal effort    GI: BS +, non-tender, non-distended   Skin: Warm and dry.  Intact. Psych: pleasant Musc: no pain with cervical ROM  Neurologic: Alert, good insight and awareness. Left central 7 Motor:  LUE: 3 to 3+/5 LLE: 4-/5--no changes  Assessment/Plan: 1. 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  Physiatrist is providing close team supervision and 24 hour management of active medical problems listed below.  Physiatrist and rehab team continue to assess barriers to discharge/monitor patient progress toward functional and medical goals  Care Tool:  Bathing    Body parts bathed by patient: Right arm, Left arm,  Chest, Abdomen, Right upper leg, Left upper leg, Face, Front perineal area   Body parts bathed by helper: Buttocks, Right lower leg, Left lower leg     Bathing assist Assist Level: Moderate Assistance - Patient 50 - 74%     Upper Body Dressing/Undressing Upper body dressing   What is the patient wearing?: Pull over shirt, Bra    Upper body assist Assist Level: Moderate Assistance - Patient 50 - 74%    Lower Body Dressing/Undressing Lower body dressing      What is the patient wearing?: Pants, Underwear/pull up     Lower body assist Assist for lower body dressing: Maximal Assistance - Patient 25 - 49%     Toileting Toileting    Toileting assist Assist for toileting: Minimal Assistance - Patient > 75%     Transfers Chair/bed transfer  Transfers assist     Chair/bed transfer assist level: Minimal Assistance - Patient > 75%     Locomotion Ambulation   Ambulation assist   Ambulation activity did not occur: Safety/medical concerns  Assist level: Minimal Assistance - Patient > 75% Assistive device: Walker-rolling Max distance: 50 ft   Walk 10 feet activity   Assist  Walk 10 feet activity did not occur: Safety/medical concerns  Assist level: Minimal Assistance - Patient > 75% Assistive device: Walker-rolling   Walk 50 feet activity   Assist Walk 50 feet with 2 turns activity did not occur: Safety/medical concerns  Assist level: Minimal Assistance - Patient > 75% Assistive device: Walker-rolling    Walk 150 feet activity   Assist Walk 150 feet activity did not occur: Safety/medical concerns         Walk 10 feet on uneven surface  activity   Assist Walk 10 feet on uneven surfaces activity did not occur: Safety/medical concerns         Wheelchair     Assist Will patient use wheelchair at discharge?: Yes Type of Wheelchair: Manual    Wheelchair assist level: Maximal Assistance - Patient 25 - 49% Max wheelchair distance: 68ft     Wheelchair 50 feet with 2 turns activity    Assist    Wheelchair 50 feet with 2 turns activity did not occur: Safety/medical concerns       Wheelchair 150 feet activity     Assist Wheelchair 150 feet activity did not occur: Safety/medical concerns        Medical Problem List and Plan: 1.Left hemiparesis, dysphagia  and functional deficitssecondary to large right MCA infarct  Continue CIR PT, OT, SLP 2. Antithrombotics: -DVT/anticoagulation:  Sequential compression devices, below kneeBilateral lower extremities DC'd   Left post tibial DVT extended to L popliteal vein, now felt to be acute with propagation, continue Eliquis, no signs of bleeding    CBC ordered for Monday  -antiplatelet therapy: Low dose ASA. 3.OA bilateral knees/Pain Management:tylenol prn.  Voltaren gel to bilateral knees. Chronic neck pain CT neck 7/20 C5-6, C6-7 DDD 4. Mood:LCSW to follow for evaluation and support.  -antipsychotic agents: N/A 5. Neuropsych: This patientis not fully capable of making decisions on onown behalf. 6. Skin/Wound Care:Routine pressure relief measures. 7. Fluids/Electrolytes/Nutrition:Monitor I/O.   BMP ordered for Monday  8.HTN: Monitor BP.  Avoid hypoperfusion Vitals:   12/22/18 1950 12/23/18 0454  BP: 130/65 (!) 124/59  Pulse: 89 79  Resp: 18 18  Temp: 97.8 F (36.6 C) 98 F (36.7 C)  SpO2: 98% 94%   Controlled 8/11  9. UTI: R to Macrobid , no Allergies, continue amoxicillin until 8/3                     10. Prediabetes with hyperglycemia: Hgb A1c-5.9. CM diet.   Reasonable control 8/10    CBG (last 3)  Recent Labs    12/22/18 1124 12/22/18 2120 12/23/18 0620  GLUCAP 134* 119* 108*   11. Morbid obesity: Educate patient on CM/HH diet and importance of weight loss to help promote health and mobility.  12. Dyslipidemia: On Atorvastatin 13.  Neurogenic bladder  Cont flomax and urecholine  With frequency at  times   14.  Constipation:   Improving with daily Miralax   15.  Left neck pain improved  CT showing DDD C5-6,6-7- Tingling LUE likely CVA related , as it is non dermatomal   LOS: 21 days A FACE TO [place] E Brannen Koppen 12/23/2018, 8:38 AM

## 2018-12-23 NOTE — Progress Notes (Signed)
Speech Language Pathology Daily Session Note  Patient Details  Name: Deanna [name] MRN: [id number] Date of Birth: [birth year]  Today's Date: 12/23/2018 SLP Individual Time: 6712-4580 SLP Individual Time Calculation (min): 42 min  Short Term Goals: Week 3: SLP Short Term Goal 1 (Week 3): STGs = LTGS d/t remaining ELOS  Skilled Therapeutic Interventions:Skilled ST services focused on education and cognitive skills. Pt's two daughters were present for education. SLP facilitated semi-complex problem and error awareness skills utilizing familiar calendar task, pt required supervision A verbal cues and min A verbal cues for error awareness. This was skills set was much improved from initial completion of task. SLP provided education on need for assistance in complex tasks, selective attention and error awareness, all questions were answered to satisfaction. Pt was left in room with daughters, call bell within reach and chair alarm set. ST recommends to continue skilled ST services.      Pain Pain Assessment Pain Scale: 0-10 Pain Score: 0-No pain Pain Type: Acute pain Pain Location: Shoulder Pain Orientation: Left Pain Radiating Towards: hand Pain Descriptors / Indicators: Aching Pain Frequency: Intermittent Pain Onset: Gradual Pain Intervention(s): Medication (See eMAR)  Therapy/Group: Individual Therapy  Alyson Ki  [facility] 12/23/2018, 12:27 PM

## 2018-12-23 NOTE — Progress Notes (Signed)
Physical Therapy Session Note  Patient Details  Name: Deanna [name] MRN: [id number] Date of Birth: 1951/03/06  Today's Date: 12/23/2018 PT Individual Time: 1630-1700 PT Individual Time Calculation (min): 30 min   Short Term Goals: Week 3:  PT Short Term Goal 1 (Week 3): = to LTGs based on ELOS  Skilled Therapeutic Interventions/Progress Updates:    Pt seated in recliner upon PT arrival, agreeable to therapy tx and denies pain. Pt transferred to recliner with RW and min assist, stand pivot. Pt transported to the dayroom. Pt performed w/c<>nustep transfer with RW and min assist, cues for techniques and sequencing and RW management. Pt used nustep x5 minutes on workload 5 for global strengthening and endurance. Pt transported to gym. Pt worked on turns and obstacle navigation to weave through cones and step over object, min assist with RW and cues for RW management and L attention. Pt transported back to room and transferred to recliner with RW and min assist. Pt left with needs in reach and chair alarm set.   Therapy Documentation Precautions:  Precautions Precautions: Fall Precaution Comments: left neglect Restrictions Weight Bearing Restrictions: No    Therapy/Group: Individual Therapy  Netta Corrigan, PT, DPT 12/23/2018, 5:32 PM

## 2018-12-23 NOTE — Progress Notes (Signed)
Physical Therapy Session Note  Patient Details  Name: Cardelia Sassano MRN: [id number] Date of Birth: [birth year]  Today's Date: 12/23/2018 PT Individual Time: 6004-5997 PT Individual Time Calculation (min): 71 min   Short Term Goals: Week 3:  PT Short Term Goal 1 (Week 3): = to LTGs based on ELOS  Skilled Therapeutic Interventions/Progress Updates:    Pt received sitting in recliner with 2 of her daughters present for family education/trianing and pt agreeable to therapy session. Stand pivot recliner>w/c using RW with min assist for balance and pt demonstrating improved sequencing of transfers requiring min cuing for LE stepping and AD management - therapist educating pt's daughter's on proper sequencing of transfer and positioning to provide pt assistance. Transported to/from gym in w/c. Stand pivot transfers w/c<>simulated car (sedan height) x1 with min assist for balance and LLE in/out of car - therapist demonstrating variation in using RW vs no AD to accommodate small space in door of car - pt/family education/training on sequencing of transfer and proper positioning to assist pt. Pt's daughter demonstrated understanding of car transfer without AD and without hands-on assistance from therapist. Educated pt/family on how to fold up w/c and don/doff leg rest to place w/c in car. Pt's daughters report pt has 2 STE home with L HR that are 7" and 8" in height. Pt ascended/descended 2 (6" height) steps x2 using B UE support on L HR with therapist providing mod assist for lifting/lowering and balance as well as assisting L LE placement on descent  - educated/trained pt/family on side stepping technique and how to assist pt safely. Pt ascended/descended 2 (6" height) steps with her daughter providing mod assist and therapist providing CGA/min assist for balance and L LE management on descent - therapist educated pt/family that this is not safe to perform at home without a therapist and recommended they build a  ramp - family in agreement and SW notified. Ambulated ~60ft with therapist educating pt's family on proper positioning for assisting pt and proper use of gait belt - pt ambulated ~87ft and ~59ft x4 in home environment (including turns) with her family providing assistance - therapist provided min cuing for pt/family sequencing and positioning progressed to no cuing. Sit<>stand recliner<>RW with min assist for lifting into standing due to lower surface. Transported back to room in w/c. Performed stand pivot w/c>toilet using grab bars with pt's family providing assistance to demonstrate carryover of education in AM session with close supervision from therapist for safety and min cuing on technique. Pt's family reports feeling competent to assist pt at home with the mobility tasks trained on today. Pt left sitting on toilet with her family present and pt educated on use of call bell for nursing assistance - call bell left in pt's hand.   Therapy Documentation Precautions:  Precautions Precautions: Fall Precaution Comments: left neglect Restrictions Weight Bearing Restrictions: No  Pain: Reports he knees are "grinding" while performing stair navigation but this does not limit participation in therapy and otherwise no complaints of pain.   Therapy/Group: Individual Therapy  Tawana Scale, PT, DPT 12/23/2018, 12:53 PM

## 2018-12-23 NOTE — Plan of Care (Signed)
  Problem: Consults Goal: RH STROKE PATIENT EDUCATION Description: See Patient Education module for education specifics  Outcome: Progressing Goal: Nutrition Consult-if indicated Outcome: Progressing   Problem: RH BOWEL ELIMINATION Goal: RH STG MANAGE BOWEL WITH ASSISTANCE Description: STG Manage Bowel with mod Assistance. Outcome: Progressing Goal: RH STG MANAGE BOWEL W/MEDICATION W/ASSISTANCE Description: STG Manage Bowel with Medication with min Assistance. Outcome: Progressing   Problem: RH BLADDER ELIMINATION Goal: RH STG MANAGE BLADDER WITH ASSISTANCE Description: STG Manage Bladder With mod Assistance Outcome: Progressing   Problem: RH SKIN INTEGRITY Goal: RH STG SKIN FREE OF INFECTION/BREAKDOWN Description: Skin to remain free from breakdown while on rehab with min assist Outcome: Progressing   Problem: RH SAFETY Goal: RH STG ADHERE TO SAFETY PRECAUTIONS W/ASSISTANCE/DEVICE Description: STG Adhere to Safety Precautions With mod Assistance and appropriate assistive Device. Outcome: Progressing Goal: RH STG DECREASED RISK OF FALL WITH ASSISTANCE Description: STG Decreased Risk of Fall With mod Assistance. Outcome: Progressing

## 2018-12-24 ENCOUNTER — Inpatient Hospital Stay (HOSPITAL_COMMUNITY): Payer: Medicare Other | Admitting: Occupational Therapy

## 2018-12-24 ENCOUNTER — Inpatient Hospital Stay (HOSPITAL_COMMUNITY): Payer: Medicare Other

## 2018-12-24 LAB — GLUCOSE, CAPILLARY
Glucose-Capillary: 103 mg/dL — ABNORMAL HIGH (ref 70–99)
Glucose-Capillary: 139 mg/dL — ABNORMAL HIGH (ref 70–99)
Glucose-Capillary: 140 mg/dL — ABNORMAL HIGH (ref 70–99)
Glucose-Capillary: 143 mg/dL — ABNORMAL HIGH (ref 70–99)

## 2018-12-24 NOTE — Progress Notes (Signed)
Occupational Therapy Session Note  Patient Details  Name: Deanna [name] MRN: [id number] Date of Birth: Oct 09, 1950  Today's Date: 12/24/2018 OT Individual Time: 0945-1100 OT Individual Time Calculation (min): 75 min    Short Term Goals: Week 3:  OT Short Term Goal 1 (Week 3): current goals = LTGs  Skilled Therapeutic Interventions/Progress Updates:      Pt seen for BADL retraining of toileting, bathing, and dressing with a focus on L side awareness and use of LUE, standing balance, transfers. See details below. Pt did very well today ambulating in and out of bathroom with RW with CGA and was even able to sit>< stand with close S.  Keeping her bra clasped she was able to pull over her head and then adjust and pull arms into holes with min A. Min A with pullover shirt.  She used a dressing stick to A with LB dressing but a reacher may be easier.  Pt resting in recliner at end of session with all needs met and seat belt alarm on.   Therapy Documentation Precautions:  Precautions Precautions: Fall Precaution Comments: left neglect Restrictions Weight Bearing Restrictions: No      Pain: Pain Assessment Pain Scale: 0-10 Pain Score: 0-No pain ADL: ADL Eating: Set up Where Assessed-Eating: Chair Grooming: Independent Where Assessed-Grooming: Sitting at sink Upper Body Bathing: Supervision/safety Where Assessed-Upper Body Bathing: Shower Lower Body Bathing: Minimal assistance Where Assessed-Lower Body Bathing: Shower Upper Body Dressing: Minimal assistance Where Assessed-Upper Body Dressing: Chair Lower Body Dressing: Moderate assistance Where Assessed-Lower Body Dressing: Chair Toileting: Moderate assistance Where Assessed-Toileting: Glass blower/designer: Therapist, music Method: Counselling psychologist: Energy manager: Curator Method: Heritage manager: Radio broadcast assistant, Grab  bars   Therapy/Group: Individual Therapy  [place] 12/24/2018, 12:18 PM

## 2018-12-24 NOTE — Plan of Care (Signed)
  Problem: Consults Goal: RH STROKE PATIENT EDUCATION Description: See Patient Education module for education specifics  Outcome: Progressing Goal: Nutrition Consult-if indicated Outcome: Progressing   Problem: RH BOWEL ELIMINATION Goal: RH STG MANAGE BOWEL WITH ASSISTANCE Description: STG Manage Bowel with mod Assistance. Outcome: Progressing Goal: RH STG MANAGE BOWEL W/MEDICATION W/ASSISTANCE Description: STG Manage Bowel with Medication with min Assistance. Outcome: Progressing   Problem: RH BLADDER ELIMINATION Goal: RH STG MANAGE BLADDER WITH ASSISTANCE Description: STG Manage Bladder With mod Assistance Outcome: Progressing   Problem: RH SKIN INTEGRITY Goal: RH STG SKIN FREE OF INFECTION/BREAKDOWN Description: Skin to remain free from breakdown while on rehab with min assist Outcome: Progressing   Problem: RH SAFETY Goal: RH STG ADHERE TO SAFETY PRECAUTIONS W/ASSISTANCE/DEVICE Description: STG Adhere to Safety Precautions With mod Assistance and appropriate assistive Device. Outcome: Progressing Goal: RH STG DECREASED RISK OF FALL WITH ASSISTANCE Description: STG Decreased Risk of Fall With mod Assistance. Outcome: Progressing

## 2018-12-24 NOTE — Progress Notes (Signed)
[place] PHYSICAL MEDICINE & REHABILITATION PROGRESS NOTE   Subjective/Complaints:  No issues overnite   ROS: Patient denies nausea, vomiting, diarrhea, cough, shortness of breath or chest pain, joint  + back pain but at baseline  objective:   No results found. Recent Labs    12/22/18 0917  WBC 5.9  HGB 13.5  HCT 40.4  PLT 298   Recent Labs    12/22/18 0917  NA 138  K 3.9  CL 101  CO2 25  GLUCOSE 180*  BUN 12  CREATININE 1.00  CALCIUM 9.1    Intake/Output Summary (Last 24 hours) at 12/24/2018 0819 Last data filed at 12/24/2018 0815 Gross per 24 hour  Intake 1558 ml  Output -  Net 1558 ml     Physical Exam: Vital Signs Blood pressure (!) 112/51, pulse 84, temperature 97.9 F (36.6 C), temperature source Oral, resp. rate 16, height 5' 1" (1.549 m), weight 107.2 kg, SpO2 97 %. Constitutional: No distress . Vital signs reviewed. HEENT: EOMI, oral membranes moist Neck: supple Cardiovascular: RRR without murmur. No JVD    Respiratory: CTA Bilaterally without wheezes or rales. Normal effort    GI: BS +, non-tender, non-distended   Skin: Warm and dry.  Intact. Psych: pleasant Musc: no pain with cervical ROM  Neurologic: Alert, good insight and awareness. Left central 7 Motor:  LUE: 3 to 3+/5 LLE: 4-/5--no changes  Assessment/Plan: 1. 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  Physiatrist is providing close team supervision and 24 hour management of active medical problems listed below.  Physiatrist and rehab team continue to assess barriers to discharge/monitor patient progress toward functional and medical goals  Care Tool:  Bathing    Body parts bathed by patient: Right arm, Left arm, Chest, Abdomen, Right upper leg, Left upper leg, Face, Front perineal area, Right lower leg, Left lower  leg(used long handled sponge)   Body parts bathed by helper: Buttocks     Bathing assist Assist Level: Minimal Assistance - Patient > 75%     Upper Body Dressing/Undressing Upper body dressing   What is the patient wearing?: Pull over shirt, Bra    Upper body assist Assist Level: Moderate Assistance - Patient 50 - 74%    Lower Body Dressing/Undressing Lower body dressing      What is the patient wearing?: Pants, Underwear/pull up     Lower body assist Assist for lower body dressing: Moderate Assistance - Patient 50 - 74%     Toileting Toileting    Toileting assist Assist for toileting: Moderate Assistance - Patient 50 - 74%     Transfers Chair/bed transfer  Transfers assist     Chair/bed transfer assist level: Minimal Assistance - Patient > 75%     Locomotion Ambulation   Ambulation assist   Ambulation activity did not occur: Safety/medical concerns  Assist level: Minimal Assistance - Patient > 75% Assistive device: Walker-rolling Max distance: 49f   Walk 10 feet activity   Assist  Walk 10 feet activity did not occur: Safety/medical concerns  Assist level: Minimal Assistance - Patient > 75% Assistive device: Walker-rolling   Walk 50 feet activity   Assist Walk 50 feet with 2 turns activity did not occur: Safety/medical concerns  Assist level: Minimal Assistance - Patient > 75% Assistive device: Walker-rolling    Walk 150 feet activity   Assist Walk  150 feet activity did not occur: Safety/medical concerns         Walk 10 feet on uneven surface  activity   Assist Walk 10 feet on uneven surfaces activity did not occur: Safety/medical concerns         Wheelchair     Assist Will patient use wheelchair at discharge?: Yes Type of Wheelchair: Manual    Wheelchair assist level: Maximal Assistance - Patient 25 - 49% Max wheelchair distance: 54f    Wheelchair 50 feet with 2 turns activity    Assist    Wheelchair 50  feet with 2 turns activity did not occur: Safety/medical concerns       Wheelchair 150 feet activity     Assist Wheelchair 150 feet activity did not occur: Safety/medical concerns        Medical Problem List and Plan: 1.Left hemiparesis, dysphagia  and functional deficitssecondary to large right MCA infarct  Continue CIR PT, OT, SLP, Team conference today please see physician documentation under team conference tab, met with team face-to-face to discuss problems,progress, and goals. Formulized individual treatment plan based on medical history, underlying problem and comorbidities. 2. Antithrombotics: -DVT/anticoagulation:  Sequential compression devices, below kneeBilateral lower extremities DC'd   Left post tibial DVT extended to L popliteal vein, now felt to be acute with propagation, continue Eliquis, no signs of bleeding    CBC ordered for Monday  -antiplatelet therapy: Low dose ASA. 3.OA bilateral knees/Pain Management:tylenol prn.  Voltaren gel to bilateral knees. Chronic neck pain CT neck 7/20 C5-6, C6-7 DDD 4. Mood:LCSW to follow for evaluation and support.  -antipsychotic agents: N/A 5. Neuropsych: This patientis not fully capable of making decisions on onown behalf. 6. Skin/Wound Care:Routine pressure relief measures. 7. Fluids/Electrolytes/Nutrition:Monitor I/O.   BMP ordered for Monday  8.HTN: Monitor BP.  Avoid hypoperfusion Vitals:   12/23/18 2023 12/24/18 0519  BP: (!) 148/78 (!) 112/51  Pulse: 81 84  Resp: 16 16  Temp: 97.7 F (36.5 C) 97.9 F (36.6 C)  SpO2: 97% 97%   Controlled 8/12  9. UTI: R to Macrobid , no Allergies, continue amoxicillin until 8/3                     10. Prediabetes with hyperglycemia: Hgb A1c-5.9. CM diet.   Reasonable control 8/12    CBG (last 3)  Recent Labs    12/23/18 0620 12/23/18 2101 12/24/18 0611  GLUCAP 108* 109* 103*   11. Morbid obesity: Educate patient on CM/HH diet  and importance of weight loss to help promote health and mobility.  12. Dyslipidemia: On Atorvastatin 13.  Neurogenic bladder  Cont flomax and urecholine  With frequency at times   14.  Constipation:   Improving with daily Miralax   15.  Left neck pain improved  CT showing DDD C5-6,6-7- Tingling LUE likely CVA related , as it is non dermatomal   LOS: 22 days A FACE TO FScurryE Deanna [name] 12/24/2018, 8:19 AM

## 2018-12-24 NOTE — Progress Notes (Signed)
Physical Therapy Session Note  Patient Details  Name: Deanna [name] MRN: [id number] Date of Birth: 10/11/1950  Today's Date: 12/24/2018 PT Individual Time: 4718-5501 PT Individual Time Calculation (min): 57 min   Short Term Goals: Week 3:  PT Short Term Goal 1 (Week 3): = to LTGs based on ELOS  Skilled Therapeutic Interventions/Progress Updates:    Pt seated in recliner upon PT arrival, agreeable to therapy tx and denies pain. Pt's husband reports that he does not think he will have a ramp in time, he reports that he was going to order one online and it would not arrive until next Wednesday. Pt performed stand pivot to w/c with RW and min assist, cues for placement and techniques. Pt transported to the gym . Pt ascended/descended 2 steps x2 with L rail and mod assist, step to pattern and cues for techniques. Pt transported to ortho gym and worked on car transfers. Performed x 3 stand pivot transfers without AD from w/c<>car with min-mod assist and cues for techniques/placement, pt used car door to put R hand on when getting in (instructed pt that they would have to roll window down in car in order to hold there.) Pt transported to gym and worked on ambulation this session, ambulated x 40 ft & x 35 ft with RW and min assist, increased cues when turning to sit. Pt performed x 10 sit<>stands from mat without AD and min assist, cues for techniques working on LE strengthening. Pt transported back to room and transferred to recliner with RW and min assist, cues for techniques/sequencing and placement. Pt left in recliner with needs in reach and chair alarm set.   Therapy Documentation Precautions:  Precautions Precautions: Fall Precaution Comments: left neglect Restrictions Weight Bearing Restrictions: No    Therapy/Group: Individual Therapy  Netta Corrigan, PT, DPT 12/24/2018, 2:06 PM

## 2018-12-24 NOTE — Progress Notes (Signed)
Speech Language Pathology Daily Session Note  Patient Details  Name: Deanna [name] MRN: [id number] Date of Birth: 12/09/1950  Today's Date: 12/24/2018 SLP Individual Time: 1302-1400 SLP Individual Time Calculation (min): 58 min  Short Term Goals: Week 3: SLP Short Term Goal 1 (Week 3): STGs = LTGS d/t remaining ELOS  Skilled Therapeutic Interventions: Skilled ST services focused on cognitive skills. SLP facilitated personal medication management with binary pill box organizer, pt demonstrated Mod I for semi-complex problem solving and error awareness. Pt demonstrated ability to recall medication name/function/tmes per day with visual aid given supervision A verbal cues and without aid mod A verbal cues fading to min A verbal cues on consecutive trials. SLP also facilitated semi-complex problem solving and error awareness skills with novel card task, pt required -max A fade to mod A verbal cues (3/4th through task) and supervision A verbal cues to recall three rules. Pt was left in room with call bell within reach and chair alarm set. ST recommends to continue skilled ST services.      Pain Pain Assessment Pain Score: 0-No pain  Therapy/Group: Individual Therapy  Nateisha Moyd  [facility] 12/24/2018, 2:50 PM

## 2018-12-25 ENCOUNTER — Ambulatory Visit (HOSPITAL_COMMUNITY): Payer: Medicare Other | Admitting: Physical Therapy

## 2018-12-25 ENCOUNTER — Encounter (HOSPITAL_COMMUNITY): Payer: Medicare Other | Admitting: Occupational Therapy

## 2018-12-25 ENCOUNTER — Encounter (HOSPITAL_COMMUNITY): Payer: Medicare Other | Admitting: Speech Pathology

## 2018-12-25 LAB — GLUCOSE, CAPILLARY: Glucose-Capillary: 91 mg/dL (ref 70–99)

## 2018-12-25 NOTE — Progress Notes (Signed)
Patient ID: Deanna [name], female   DOB: 1951/04/24, 68 y.o.   MRN: [id number]      Diagnosis codes:  I63.411; I69.354  Height:      5'1"          Weight:     246 lbs      Patient suffers from hemiplegia and hemiparesis following cerebral infarction which impairs her ability to perform daily activities like feeding, dressing, toileting, grooming, bathing in the home.  A walker or cane will not resolve issue with performing activities of daily living.  A wheelchair will allow patient to safely perform daily activities.  Patient is not able to propel herself in the home using a standard weight wheelchair due to arm weakness and endurance.  Patient can self propel in the lightweight wheelchair.

## 2018-12-25 NOTE — Progress Notes (Signed)
Speech Language Pathology Daily Session Note  Patient Details  Name: Shellie Rogoff MRN: [id number] Date of Birth: Mar 24, 1951  Today's Date: 12/25/2018 SLP Individual Time: 1100-1200 SLP Individual Time Calculation (min): 60 min  Short Term Goals: Week 3: SLP Short Term Goal 1 (Week 3): STGs = LTGS d/t remaining ELOS  Skilled Therapeutic Interventions:  Skilled treatment session focusedon completing education with pt's husband and daughter Jeannetta Nap). Education provided on attention deficits and impact on safety during movement and specific education provided on pt's requirement of continual 24 hour supervision. Pt desires to sit in her room, in her special chair by herself for several hours. SLP provided ideas/strategies to allow pt some "quiet time" while also incoorperating safety. All questions answered to their satisfaction.      Pain Pain Assessment Pain Scale: 0-10 Pain Score: 0-No pain Faces Pain Scale: No hurt  Therapy/Group: Individual Therapy  Giliana Vantil 12/25/2018, 1:24 PM

## 2018-12-25 NOTE — Patient Care Conference (Signed)
Inpatient RehabilitationTeam Conference and Plan of Care Update Date: 12/24/2018   Time: 11:25 AM    Patient Name: Deanna [name]      Medical Record Number: [id number]  Date of Birth: 1951-03-16 Sex: Female         Room/Bed: 4W16C/4W16C-01 Payor Info: Payor: MEDICARE / Plan: MEDICARE PART A AND B / Product Type: *No Product type* /    Admitting Diagnosis: 6. CVA 2 Team  RT. CVA; 22-24days  Admit Date/Time:  12/02/2018  3:54 PM Admission Comments: No comment available   Primary Diagnosis:  <principal problem not specified> Principal Problem: <principal problem not specified>  Patient Active Problem List   Diagnosis Date Noted  . Labile blood glucose   . Acute deep vein thrombosis (DVT) of tibial vein of left lower extremity ([place])   . Neurogenic bladder   . Essential hypertension   . Urinary frequency   . Slow transit constipation   . Labile blood pressure   . Acute deep vein thrombosis (DVT) of left tibial vein ([facility])   . Acute left hemiparesis ([place]) 12/02/2018  . Hyperlipemia 12/02/2018  . Prediabetes 12/02/2018  . Morbid obesity ([place]) 12/02/2018  . Chronic back pain 12/02/2018  . Urinary retention 12/02/2018  . Acute ischemic right MCA stroke ([place]) 12/02/2018  . Acute ischemic stroke ([place]) large R MCA and 2 L infarcts s/p tPA and mechanical thrombectomy 11/29/2018  . 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 S/P right knee arthroscopy 07/09/2017  . S/P knee surgery 01/10/2017    Expected Discharge Date: Expected Discharge Date: 12/26/18(after therapies)  Team Members Present: Physician leading conference: Dr. Alysia Penna Social Worker Present: Alfonse Alpers, LCSW Nurse Present: Genene Churn, RN PT Present: Michaelene Song, PT OT Present: Clyda Greener, OT SLP Present: Charolett Bumpers, SLP PPS Coordinator present : Gunnar Fusi, SLP     Current Status/Progress Goal Weekly Team Focus  Medical   Left neglect improving, improved cognition.  Left  hemiparesis improving as well.  Improve mobility, reduce fall risk, reduce recurrent stroke risk  Discontinue Flomax and Urecholine monitor for recurrence of retention, discharge planning   Bowel/Bladder   Continent b/b. LBM 12/23/2018  Remain continent of b/b  assoss toileting q shift and prn   Swallow/Nutrition/ Hydration             ADL's   Pt is on track to meeting goals and has surpassed some LTGs.  CGA sit to stand and min stand balance (surpassed); min bathing with AE, min toilet transfer, mod A toileting for clothing management; mod A with UB dressing (including bra), mod to max LB dressing  mod A dynamic stand balance, min A sit to stand; min bathing, toileting, toilet transfer; mod A dressing;  family education, ADL training, VP skills, functional mobility, LUE NMR   Mobility   mod assist bed mobility, min assist stand pivot transfers using RW, min assist gait 13ft using RW  min assist overall and supervision w/c propulsion  gait training in various environments using RW, transfer training, standing balance, L attention, pt education, family education/training   Communication             Safety/Cognition/ Behavioral Observations  Mod-Min A  Min A - semi-complex problem solving, Mod A emergent/selective attention  semi/complex problem solving, medication management, error awareness and selective attention   Pain   no c/o pain  remain pain free  assess pain q shift and prn   Skin   ecchymosis to bilateral upper extremities  skin remain intact and free of infection  assess skin q shift/ prn    Rehab Goals Patient on target to meet rehab goals: Yes Rehab Goals Revised: none *See Care Plan and progress notes for long and short-term goals.     Barriers to Discharge  Current Status/Progress Possible Resolutions Date Resolved   Physician    Medical stability;Neurogenic Bowel & Bladder     Progressing well towards goals of discharge this week  Caregiver training, see above       Nursing                  PT                    OT                  SLP                SW                Discharge Planning/Teaching Needs:  Pt to return to her home with her husband and three dtrs taking turns caring for pt.  Family education this week.   Team Discussion:  Pt with some urinary frequency still, but not requiring I/O caths, meds to be stopped.  Pt is continent and has no skin or pain issues.  Pt is doing well with OT and will need min/mod A at home.  Pt can walk 50' min A and needs cues for everything.  She will be ready on Friday for d/c.  Pt made great progress with ST and continues to work on problem solving and awareness and education of family for them to help pt with higher level tasks.  Revisions to Treatment Plan:  none    Continued Need for Acute Rehabilitation Level of Care: The patient requires daily medical management by a physician with specialized training in physical medicine and rehabilitation for the following conditions: Daily direction of a multidisciplinary physical rehabilitation program to ensure safe treatment while eliciting the highest outcome that is of practical value to the patient.: Yes Daily medical management of patient stability for increased activity during participation in an intensive rehabilitation regime.: Yes Daily analysis of laboratory values and/or radiology reports with any subsequent need for medication adjustment of medical intervention for : Neurological problems;Urological problems   I attest that I was present, lead the team conference, and concur with the assessment and plan of the team.Team conference was held via web/ teleconference due to [place] - 19.   [name], Silvestre Mesi 12/25/2018, 1:07 AM

## 2018-12-25 NOTE — Discharge Summary (Signed)
Physician Discharge Summary  Patient ID: Deanna [name] MRN: [id number] DOB/AGE: 1950/09/25 68 y.o.  Admit date: 12/02/2018 Discharge date: 12/26/2018  Discharge Diagnoses:  Principal Problem:   Acute ischemic right MCA stroke [facility]) Active Problems:   Prediabetes   Slow transit constipation   Acute deep vein thrombosis (DVT) of left tibial vein (HCC)   Neurogenic bladder   Acute bilateral deep vein thrombosis (DVT) of popliteal veins (HCC)   Discharged Condition:  Stable   Significant Diagnostic Studies: Ct Head Wo Contrast  Result Date: 12/08/2018 CLINICAL DATA:  Stroke follow-up EXAM: CT HEAD WITHOUT CONTRAST TECHNIQUE: Contiguous axial images were obtained from the base of the skull through the vertex without intravenous contrast. COMPARISON:  12/01/2018, 11/29/2018 FINDINGS: Brain: Redemonstration of a large territory infarct within the posterior right MCA territory. Progressive decreased attenuation within the infarcted territory. Area of hemorrhagic transformation anteriorly in the region of the right insular cortex appears slightly increased in craniocaudal extent compared to prior study (series 5, image 33). 2 mm right-to-left midline shift, similar to prior. No evidence of tonsillar herniation. No new area of acute infarction. Vascular: No hyperdense vessel or unexpected calcification. Skull: Normal. Negative for fracture or focal lesion. Sinuses/Orbits: No acute finding. Other: None. IMPRESSION: Continued evolution of large posterior right MCA territory infarction with slight interval increase in size of hemorrhagic component within the deep white matter. There is 1-2 mm right-to-left midline shift, not substantially changed compared to prior. Electronically Signed   By: Davina Poke M.D.   On: 12/08/2018 13:07    Vas [place] Lower Extremity Venous (dvt)  Result Date: 12/08/2018  Lower Venous Study Indications: Follow-up DVT. Patient in rehab.  Risk Factors: DVT Found 12/03/18 in left  posterior tibial veins. Comparison Study: Prior study from 12/03/18 is available for comparison. Performing Technologist: Sharion Dove RVS  Examination Guidelines: A complete evaluation includes B-mode imaging, spectral Doppler, color Doppler, and power Doppler as needed of all accessible portions of each vessel. Bilateral testing is considered an integral part of a complete examination. Limited examinations for reoccurring indications may be performed as noted.  +-----+---------------+---------+-----------+----------+-------+ RIGHTCompressibilityPhasicitySpontaneityPropertiesSummary +-----+---------------+---------+-----------+----------+-------+ CFV  Full           Yes      Yes                          +-----+---------------+---------+-----------+----------+-------+   +---------+---------------+---------+-----------+----------+-------+ LEFT     CompressibilityPhasicitySpontaneityPropertiesSummary +---------+---------------+---------+-----------+----------+-------+ CFV      Full           Yes      Yes                          +---------+---------------+---------+-----------+----------+-------+ SFJ      Full                                                 +---------+---------------+---------+-----------+----------+-------+ FV Prox  Full                                                 +---------+---------------+---------+-----------+----------+-------+ FV Mid   Full                                                 +---------+---------------+---------+-----------+----------+-------+  FV DistalFull                                                 +---------+---------------+---------+-----------+----------+-------+ PFV      Full                                                 +---------+---------------+---------+-----------+----------+-------+ POP      None           No       No                   Acute    +---------+---------------+---------+-----------+----------+-------+ PTV      None           No       No                   Acute   +---------+---------------+---------+-----------+----------+-------+ PERO     Full                                                 +---------+---------------+---------+-----------+----------+-------+     Summary: Right: No evidence of common femoral vein obstruction. Left: Findings consistent with acute deep vein thrombosis involving the left posterior tibial veins, and left popliteal vein. Findings suggest new clot progression as compared to previous examination.  *See table(s) above for measurements and observations. Electronically signed by Ruta Hinds MD on 12/08/2018 at 6:00:49 PM.    Final    Vas [place] Lower Extremity Venous (dvt)  Result Date: 12/03/2018  Lower Venous Study Indications: Stroke, and hermiparesis.  Comparison Study: no prior Performing Technologist: Abram Sander RVS  Examination Guidelines: A complete evaluation includes B-mode imaging, spectral Doppler, color Doppler, and power Doppler as needed of all accessible portions of each vessel. Bilateral testing is considered an integral part of a complete examination. Limited examinations for reoccurring indications may be performed as noted.  +---------+---------------+---------+-----------+----------+--------------+ RIGHT    CompressibilityPhasicitySpontaneityPropertiesSummary        +---------+---------------+---------+-----------+----------+--------------+ CFV      Full           Yes      Yes                                 +---------+---------------+---------+-----------+----------+--------------+ SFJ      Full                                                        +---------+---------------+---------+-----------+----------+--------------+ FV Prox  Full                                                         +---------+---------------+---------+-----------+----------+--------------+ FV Mid   Full                                                        +---------+---------------+---------+-----------+----------+--------------+  FV DistalFull                                                        +---------+---------------+---------+-----------+----------+--------------+ PFV      Full                                                        +---------+---------------+---------+-----------+----------+--------------+ POP      Full           Yes      Yes                                 +---------+---------------+---------+-----------+----------+--------------+ PTV      Full                                                        +---------+---------------+---------+-----------+----------+--------------+ PERO                                                  Not visualized +---------+---------------+---------+-----------+----------+--------------+   +---------+---------------+---------+-----------+----------+--------------+ LEFT     CompressibilityPhasicitySpontaneityPropertiesSummary        +---------+---------------+---------+-----------+----------+--------------+ CFV      Full           Yes      Yes                                 +---------+---------------+---------+-----------+----------+--------------+ SFJ      Full                                                        +---------+---------------+---------+-----------+----------+--------------+ FV Prox  Full                                                        +---------+---------------+---------+-----------+----------+--------------+ FV Mid   Full                                                        +---------+---------------+---------+-----------+----------+--------------+ FV DistalFull                                                         +---------+---------------+---------+-----------+----------+--------------+  PFV      Full                                                        +---------+---------------+---------+-----------+----------+--------------+ POP      Full           Yes      Yes                                 +---------+---------------+---------+-----------+----------+--------------+ PTV      None                                         Acute          +---------+---------------+---------+-----------+----------+--------------+ PERO                                                  Not visualized +---------+---------------+---------+-----------+----------+--------------+     Summary: Right: There is no evidence of deep vein thrombosis in the lower extremity. No cystic structure found in the popliteal fossa. Left: Findings consistent with age indeterminate deep vein thrombosis involving the left posterior tibial veins. No cystic structure found in the popliteal fossa.  *See table(s) above for measurements and observations. Electronically signed by Curt Jews MD on 12/03/2018 at 6:07:40 PM.    Final     Labs:  Basic Metabolic Panel: BMP Latest Ref Rng & Units 12/22/2018 12/15/2018 12/08/2018  Glucose 70 - 99 mg/dL 180(H) 121(H) 165(H)  BUN 8 - 23 mg/dL 12 11 11   Creatinine 0.44 - 1.00 mg/dL 1.00 0.87 0.87  Sodium 135 - 145 mmol/L 138 137 139  Potassium 3.5 - 5.1 mmol/L 3.9 4.4 4.2  Chloride 98 - 111 mmol/L 101 103 103  CO2 22 - 32 mmol/L 25 22 26   Calcium 8.9 - 10.3 mg/dL 9.1 9.1 9.1    CBC: CBC Latest Ref Rng & Units 12/22/2018 12/10/2018 12/09/2018  WBC 4.0 - 10.5 K/uL 5.9 8.2 7.9  Hemoglobin 12.0 - 15.0 g/dL 13.5 13.7 13.6  Hematocrit 36.0 - 46.0 % 40.4 39.9 40.9  Platelets 150 - 400 K/uL 298 337 328    CBG: Recent Labs  Lab 12/24/18 0611 12/24/18 1235 12/24/18 1711 12/24/18 2122 12/25/18 0608  GLUCAP 103* 139* 140* 143* 91    Brief HPI:   Deanna [name] is a 68 year old female with  history of prediabetes, OA bilateral knees, obesity who was admitted on 11/29/2018 with left-sided tingling and left lower extremity weakness with right gaze preference.  CTA head neck with perfusion showed proximal right M2 occlusion with associated right MCA infarct with penumbra and moderate cervical artery atherosclerosis.  She underwent cerebral Angie with endovascular revascularization of right MCA by Dr. Terrilee Files.  Follow-up MRI brain showed moderate acute right-MCA infarct with petechial hemorrhage in 1 of 2 acute left frontoparietal infarcts.    On 07/20, she had increasing left-sided weakness with nausea and follow-up CT head showed large edematous infarct in right MCA with substantial increase in hypodensity and new hemorrhagic conversion in deep white  matter of posterior frontal and temporal lobes.  She was treated with fluid boluses with recommendations to keep SBP goal less than 160.  Dr. Leonie Man felt that stroke was embolic in nature due to unknown source and she underwent TEE with loop recorder placement for monitoring.  TEE revealed aortic atherosclerosis and aortic arch and was negative for PFO or LAA thrombus.  He has had issues with urinary retention requiring INO caths as well as back pain with mild leukocytosis.  She was started on low-dose Urecholine and Foley was placed on 7/20.  Patient with resultant left-sided weakness with sensory deficits, left inattention, left hemianopsia with right gaze preference, difficulty with initiation as well as problem-solving with poor awareness of deficits.  CIR was recommended due to functional decline   Hospital Course: Bettey Muraoka was admitted to rehab 12/02/2018 for inpatient therapies to consist of PT, ST and OT at least three hours five days a week. Past admission physiatrist, therapy team and rehab RN have worked together to provide customized collaborative inpatient rehab.  She was maintained on low-dose aspirin during her stay.  Surveillance  Doppler showed DVT in left posterior tibial vein.  Repeat Dopplers 1 week later showed propagation of DVT with additional DVT in left popliteal vein.  Neurology was consulted for input and CT of head done showing stability therefore she was started on low-dose heparin x48 hours without any neurological changes.  She was transitioned to Eliquis on 07/29 and serial CBC shows H&H and platelets to be relatively stable.  Her p.o. intake has improved.  Prediabetes was monitored with a CHS CBG checks and blood sugars were overall reasonable therefore CBGs discontinued.  She was educated on importance of adhering to heart healthy/carb modified diet as well as weight loss to help promote overall health.  Her blood pressures have been monitored on twice daily basis and have been well controlled.  Constipation has resolved with adjustment of bowel program.  Foley was discontinued past admission and she was started on a bladder training program.  Urecholine was titrated upwards and Flomax was added to help with voiding function.  Urine culture showed Proteus mirabilis UTI and she was treated with amoxicillin x7 days.  With treatment of UTI, scheduled toileting and increasing mobility; her voiding function has improved and urinary retention has resolved.  She was weaned off Flomax and Urecholine by discharge and is currently continent of bowel and bladder.  She reported high levels of anxiety and concerns of depression due to current deficits.  Remeron was added to help with sleep-wake disruption as well as mood.  At time of discharge mood has greatly improved and family requested decreasing dose to 7.5 mg as they felt that patient's mood has greatly improved.  They were advised to follow-up with PCP prior to discontinuation of medication.  Dr. Cloyde Reams neuropsychologist has followed up with patient to help with coping and adjustment issues. She has had improvement left upper extremity and left lower extremity strength and is  showing improved awareness of left visual field deficits but continues to require cues due to inattention.  She has made good gains during her rehab stay and hass progressed to min assist level and will continue to receive further follow-up home health PT, OT, ST and CNA by [facility] after discharge.   Rehab course: During patient's stay in rehab weekly team conferences were held to monitor patient's progress, set goals and discuss barriers to discharge. At admission, patient required total assist with basic self-care  task and max to total assist with mobility.  She was able to consume regular diet with full supervision for safety due to visual deficits.  She exhibited severe deficits in attention, task initiation verbal inhibition emotional lability and overall awareness of deficits.  She  has had improvement in activity tolerance, balance, postural control as well as ability to compensate for deficits. He/She has had improvement in functional use LUE  and LLE as well as improvement in awareness.  She requires min assist with cues for transfers.  She is able to ambulate 55 feet with steady assist and rolling walker. She requires min assist with basic ADLs. She requires min assist for semi-complex problem-solving due to deficits in selective attention and reasoning sequencing safety and inability to inhibit verbosity.  Hands-on family education was completed with husband and multiple family members regarding all aspects of safety due to left field cut and inattention, cognitive assistance needed as well as mobility.  Disposition: Home  Diet: heart healthy/diabetic.   Special Instructions: 1.  Monitor blood sugars twice daily to 4 times daily and follow-up with PCP for input 2.  Needs 24-hour supervision/assistance.   Discharge Instructions    Ambulatory referral to Physical Medicine Rehab   Complete by: As directed    1-2 weeks transitional care appt     Allergies as of 12/26/2018   No  Known Allergies     Medication List    STOP taking these medications   bethanechol 5 MG tablet Commonly known as: URECHOLINE   ranitidine 150 MG tablet Commonly known as: ZANTAC   sodium chloride 0.9 % infusion     TAKE these medications   acetaminophen 325 MG tablet Commonly known as: TYLENOL Take 1-2 tablets (325-650 mg total) by mouth every 4 (four) hours as needed for mild pain.   apixaban 5 MG Tabs tablet Commonly known as: ELIQUIS Take 1 tablet (5 mg total) by mouth 2 (two) times daily.   aspirin 81 MG chewable tablet Chew 1 tablet (81 mg total) by mouth daily.   atorvastatin 40 MG tablet Commonly known as: LIPITOR Take 1 tablet (40 mg total) by mouth daily at 6 PM.   diclofenac sodium 1 % Gel Commonly known as: VOLTAREN Apply 2 g topically 4 (four) times daily. To left knee   mirtazapine 7.5 MG tablet Commonly known as: REMERON Take 1 tablet (7.5 mg total) by mouth at bedtime.   pantoprazole 40 MG tablet Commonly known as: PROTONIX Take 1 tablet (40 mg total) by mouth at bedtime.   polyethylene glycol 17 g packet Commonly known as: MIRALAX / GLYCOLAX Take 17 g by mouth daily.   senna-docusate 8.6-50 MG tablet Commonly known as: Senokot-S Take 2 tablets by mouth at bedtime. What changed:   how much to take  when to take this  reasons to take this      Follow-up Information    Maurice Small, MD. Call.   Specialty: Family Medicine Why: when you get home to schedule an appointment to be seen within two weeks of discharge. Contact information: [place] [address] [phone]        Charlett Blake, MD Follow up.   Specialty: Physical Medicine and Rehabilitation Why: Office will call you with follow up appointment Contact information: [place] [address] [phone]        [place]. Call on 12/31/2018.   Why: for stroke follow up.  Contact  information:  [address]     [address] [place] [address] [phone]          Signed: Bary Leriche 12/26/2018, 1:38 PM

## 2018-12-25 NOTE — Progress Notes (Signed)
Social Work Patient ID: Deanna [name], female   DOB: July 24, 1950, 68 y.o.   MRN: [id number]   CSW met with pt and family to update them on team conference discussion and that pt is still on target for d/c on 12-26-18 after therapies.  Family education is ongoing.  CSW will order DME and arrange HH to prepare pt for d/c.

## 2018-12-25 NOTE — Plan of Care (Signed)
  Problem: Consults Goal: RH STROKE PATIENT EDUCATION Description: See Patient Education module for education specifics  Outcome: Progressing Goal: Nutrition Consult-if indicated Outcome: Progressing   Problem: RH BOWEL ELIMINATION Goal: RH STG MANAGE BOWEL WITH ASSISTANCE Description: STG Manage Bowel with mod Assistance. Outcome: Progressing Goal: RH STG MANAGE BOWEL W/MEDICATION W/ASSISTANCE Description: STG Manage Bowel with Medication with min Assistance. Outcome: Progressing   Problem: RH BLADDER ELIMINATION Goal: RH STG MANAGE BLADDER WITH ASSISTANCE Description: STG Manage Bladder With mod Assistance Outcome: Progressing   Problem: RH SKIN INTEGRITY Goal: RH STG SKIN FREE OF INFECTION/BREAKDOWN Description: Skin to remain free from breakdown while on rehab with min assist Outcome: Progressing   Problem: RH SAFETY Goal: RH STG ADHERE TO SAFETY PRECAUTIONS W/ASSISTANCE/DEVICE Description: STG Adhere to Safety Precautions With mod Assistance and appropriate assistive Device. Outcome: Progressing Goal: RH STG DECREASED RISK OF FALL WITH ASSISTANCE Description: STG Decreased Risk of Fall With mod Assistance. Outcome: Progressing

## 2018-12-25 NOTE — Progress Notes (Signed)
Physical Therapy Session Note  Patient Details  Name: Deanna [name] MRN: [id number] Date of Birth: 1950-08-11  Today's Date: 12/25/2018 PT Individual Time: 1305-1420 PT Individual Time Calculation (min): 75 min   Short Term Goals: Week 3:  PT Short Term Goal 1 (Week 3): = to LTGs based on ELOS  Skilled Therapeutic Interventions/Progress Updates:   Pt received sitting in recliner finishing lunch with her husband and 3rd daughter present for family education - pt agreeable to therapy session. Pt/family educated on pt's need to use RW and gait belt during standing/ambulatory tasks as well as general education regarding pt's L inattention, impaired L HB proprioceptive awareness, and current functional mobility level. Stand pivot recliner>w/c using RW with min assist for balance and min cuing for sequencing with pt/family education on technique and to monitor pt's L LE stepping.  Transported to/from gym in w/c. Performed stand pivot w/c<>simulated car transfer ([employer] height) x1 with therapist providing mod assist for balance entering car to L, min assist L LE management in/out of car, and min assist stand pivot exiting car to the R. Performed x2 car transfer with pt's husband providing assistance - therapist providing CGA on 1st transfer then close supervision 2nd transfer with pt not stepping adequately with L LE when exiting the car resulting in her sitting back into w/c with husband assisting with control - therapist reinforced importance of safe set-up prior to performing transfer and having 2nd person monitor L LE stepping while transferring to provide pt cues as needed - pt/family verbalize/demonstrate understanding. Pt's husband confirmed that they will not have the ramp entrance prior to pt's D/C home tomorrow. Stepped up/down 2 (6" height) steps x1 with therapist providing min assist for balance and L LE placement on descent - therapist providing pt's family education regarding proper set-up prior  to initiating stair navigation (sturdy chair at top of stairs and RW in position) as well as proper positioning to provide assist during the stairs. Pt ascended/descended 2 (6" height) steps x2 with her daughter then her husband providing assistance - able to perform with family assistance and close supervision from therapist providing cuing to family on proper positioning/hand placemen to assist pt. Pt transported to ADL apartment. Ambulated ~101ft x2 w/c<>recliner with pt's husband and daughter providing min assistance for functional ambulation in household setting - min cuing from therapist on proper technique/positoining when providing assist. Transported back to room in w/c. Stand pivot w/c>recliner using RW with min assist from family for balance and close supervision from therapist providing max cuing for proper transfer set-up, family positioning to assist, and pt's sequencing. Therapist reinforced highlights of family education/training during today's session for improved carryover of learning. Pt left sitting in recliner with needs in reach, seat belt alarm on, B LEs elevated, and family present.  Therapy Documentation Precautions:  Precautions Precautions: Fall Precaution Comments: left neglect Restrictions Weight Bearing Restrictions: No  Pain:   Denies pain during session.   Therapy/Group: Individual Therapy  Tawana Scale, PT, DPT 12/25/2018, 8:00 AM

## 2018-12-25 NOTE — Progress Notes (Addendum)
[place] PHYSICAL MEDICINE & REHABILITATION PROGRESS NOTE   Subjective/Complaints:  Pt aware of d/c in am  , discussed CBG   ROS: Patient denies nausea, vomiting, diarrhea, cough, shortness of breath or chest pain, joint  + back pain but at baseline  objective:   No results found. Recent Labs    12/22/18 0917  WBC 5.9  HGB 13.5  HCT 40.4  PLT 298   Recent Labs    12/22/18 0917  NA 138  K 3.9  CL 101  CO2 25  GLUCOSE 180*  BUN 12  CREATININE 1.00  CALCIUM 9.1    Intake/Output Summary (Last 24 hours) at 12/25/2018 1610 Last data filed at 12/25/2018 0817 Gross per 24 hour  Intake 840 ml  Output -  Net 840 ml     Physical Exam: Vital Signs Blood pressure (!) 155/73, pulse 93, temperature 98 F (36.7 C), temperature source Oral, resp. rate 16, height 5\' 1"  (1.549 m), weight 107.2 kg, SpO2 97 %. Constitutional: No distress . Vital signs reviewed. HEENT: EOMI, oral membranes moist Neck: supple Cardiovascular: RRR without murmur. No JVD    Respiratory: CTA Bilaterally without wheezes or rales. Normal effort    GI: BS +, non-tender, non-distended   Skin: Warm and dry.  Intact. Psych: pleasant Musc: no pain with cervical ROM  Neurologic: Alert, good insight and awareness. Left central 7 Motor:  LUE: 3 to 3+/5 LLE: 4-/5--no changes  Assessment/Plan: 1. 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  Physiatrist is providing close team supervision and 24 hour management of active medical problems listed below.  Physiatrist and rehab team continue to assess barriers to discharge/monitor patient progress toward functional and medical goals  Care Tool:  Bathing    Body parts bathed by patient: Right arm, Left arm, Chest, Abdomen, Right upper leg, Left upper leg, Face, Front perineal area, Right lower leg,  Left lower leg   Body parts bathed by helper: Buttocks     Bathing assist Assist Level: Minimal Assistance - Patient > 75%     Upper Body Dressing/Undressing Upper body dressing   What is the patient wearing?: Pull over shirt, Bra    Upper body assist Assist Level: Minimal Assistance - Patient > 75%    Lower Body Dressing/Undressing Lower body dressing      What is the patient wearing?: Pants, Underwear/pull up     Lower body assist Assist for lower body dressing: Minimal Assistance - Patient > 75%     Toileting Toileting    Toileting assist Assist for toileting: Moderate Assistance - Patient 50 - 74%     Transfers Chair/bed transfer  Transfers assist     Chair/bed transfer assist level: Minimal Assistance - Patient > 75%     Locomotion Ambulation   Ambulation assist   Ambulation activity did not occur: Safety/medical concerns  Assist level: Minimal Assistance - Patient > 75% Assistive device: Walker-rolling Max distance: 21ft   Walk 10 feet activity   Assist  Walk 10 feet activity did not occur: Safety/medical concerns  Assist level: Minimal Assistance - Patient > 75% Assistive device: Walker-rolling   Walk 50 feet activity   Assist Walk 50 feet with 2 turns activity did not occur: Safety/medical concerns  Assist level: Minimal Assistance - Patient > 75% Assistive device: Walker-rolling    Walk 150 feet activity  Assist Walk 150 feet activity did not occur: Safety/medical concerns         Walk 10 feet on uneven surface  activity   Assist Walk 10 feet on uneven surfaces activity did not occur: Safety/medical concerns         Wheelchair     Assist Will patient use wheelchair at discharge?: Yes Type of Wheelchair: Manual    Wheelchair assist level: Maximal Assistance - Patient 25 - 49% Max wheelchair distance: 2ft    Wheelchair 50 feet with 2 turns activity    Assist    Wheelchair 50 feet with 2 turns activity  did not occur: Safety/medical concerns       Wheelchair 150 feet activity     Assist Wheelchair 150 feet activity did not occur: Safety/medical concerns        Medical Problem List and Plan: 1.Left hemiparesis, dysphagia  and functional deficitssecondary to large right MCA infarct  Continue CIR PT, OT, SLP,plan d/c in am 2. Antithrombotics: -DVT/anticoagulation:  Sequential compression devices, below kneeBilateral lower extremities DC'd   Left post tibial DVT extended to L popliteal vein, now felt to be acute with propagation, continue Eliquis, no signs of bleeding   Hgbs stable and normal   -antiplatelet therapy: Low dose ASA. 3.OA bilateral knees/Pain Management:tylenol prn.  Voltaren gel to bilateral knees. Chronic neck pain CT neck 7/20 C5-6, C6-7 DDD 4. Mood:LCSW to follow for evaluation and support.  -antipsychotic agents: N/A 5. Neuropsych: This patientis not fully capable of making decisions on onown behalf. 6. Skin/Wound Care:Routine pressure relief measures. 7. Fluids/Electrolytes/Nutrition:Monitor I/O.   BMP ordered for Monday  8.HTN: Monitor BP.  Avoid hypoperfusion Vitals:   12/24/18 2020 12/25/18 0315  BP: (!) 150/82 (!) 155/73  Pulse: 84 93  Resp: 16 16  Temp: 97.8 F (36.6 C) 98 F (36.7 C)  SpO2: 98% 97%   Controlled 8/13  9. UTI: R to Macrobid , no Allergies, continue amoxicillin until 8/3                     10. Prediabetes with hyperglycemia: Hgb A1c-5.9. CM diet.   Reasonable control 8/13  No need for CBG monitoring  CBG (last 3)  Recent Labs    12/24/18 1711 12/24/18 2122 12/25/18 0608  GLUCAP 140* 143* 91   11. Morbid obesity: Educate patient on CM/HH diet and importance of weight loss to help promote health and mobility.  12. Dyslipidemia: On Atorvastatin 13.  Neurogenic bladder  Cont flomax and urecholine  With frequency at times   14.  Constipation:   Improving with daily Miralax   15.   Left neck pain improved  CT showing DDD C5-6,6-7- Tingling LUE likely CVA related , as it is non dermatomal   LOS: 23 days A FACE TO [place] [facility] Deanna [name] 12/25/2018, 8:32 AM

## 2018-12-25 NOTE — Progress Notes (Signed)
Occupational Therapy Session Note  Patient Details  Name: Deanna [name] MRN: [id number] Date of Birth: 1951-03-11  Today's Date: 12/25/2018 OT Individual Time: 1000-1100 OT Individual Time Calculation (min): 60 min    Short Term Goals: Week 3:  OT Short Term Goal 1 (Week 3): current goals = LTGs  Skilled Therapeutic Interventions/Progress Updates:   Family education with pt's spouse and daughter.    Pt seen for BADL retraining of toileting, bathing, and dressing with a focus on L side awareness and use of LUE, standing balance, transfers. See details below. Pt did very well today ambulating in and out of bathroom with RW with CGA and was even able to sit>< stand with close S.  both of her family members practiced assisting her with sit to stand, ambulation, and transfers.   Keeping her bra clasped she was able to pull over her head and then adjust and pull arms into holes with min A. Min A with pullover shirt. She was able to reach down to pull pants over feet with min A and then over hips.  Reviewed shower set up with DME, shower hand held, and bars. Pt will need a drop arm BSC to use next to bed for nighttime needs.    Discussed home exercises and provided family with HEP.  Family did not have further questions and feel prepared for her to come home tomorrow.   Pt's speech therapist arrived for her next session.   Therapy Documentation Precautions:  Precautions Precautions: Fall Precaution Comments: left neglect Restrictions Weight Bearing Restrictions: No   Pain: no c/o pain   ADL: ADL Eating: Set up Where Assessed-Eating: Chair Grooming: Independent Where Assessed-Grooming: Sitting at sink Upper Body Bathing: Supervision/safety Where Assessed-Upper Body Bathing: Shower Lower Body Bathing: Minimal assistance Where Assessed-Lower Body Bathing: Shower Upper Body Dressing: Minimal assistance Where Assessed-Upper Body Dressing: Chair Lower Body Dressing: Moderate  assistance Where Assessed-Lower Body Dressing: Chair Toileting: Moderate assistance Where Assessed-Toileting: Glass blower/designer: Therapist, music Method: Counselling psychologist: Energy manager: Curator Method: Heritage manager: Radio broadcast assistant, Grab bars   Therapy/Group: Individual Therapy  [place] 12/25/2018, 9:23 AM

## 2018-12-26 ENCOUNTER — Ambulatory Visit (HOSPITAL_COMMUNITY): Payer: Medicare Other | Admitting: Physical Therapy

## 2018-12-26 ENCOUNTER — Encounter (HOSPITAL_COMMUNITY): Payer: Medicare Other | Admitting: Speech Pathology

## 2018-12-26 ENCOUNTER — Encounter (HOSPITAL_COMMUNITY): Payer: Medicare Other | Admitting: Occupational Therapy

## 2018-12-26 DIAGNOSIS — I82433 Acute embolism and thrombosis of popliteal vein, bilateral: Secondary | ICD-10-CM

## 2018-12-26 MED ORDER — POLYETHYLENE GLYCOL 3350 17 G PO PACK: 17.0000 g | PACK | Freq: Every day | ORAL | 0 refills | Status: DC

## 2018-12-26 MED ORDER — MIRTAZAPINE 7.5 MG PO TABS: 7.5000 mg | ORAL_TABLET | Freq: Every day | ORAL | 1 refills | Status: DC

## 2018-12-26 MED ORDER — PANTOPRAZOLE SODIUM 40 MG PO TBEC: 40.0000 mg | DELAYED_RELEASE_TABLET | Freq: Every day | ORAL | 0 refills | Status: DC

## 2018-12-26 MED ORDER — ACETAMINOPHEN 325 MG PO TABS: 325.0000 mg | ORAL_TABLET | ORAL | Status: DC | PRN

## 2018-12-26 MED ORDER — DICLOFENAC SODIUM 1 % TD GEL: 2.0000 g | Freq: Four times a day (QID) | TRANSDERMAL | 1 refills | Status: DC

## 2018-12-26 MED ORDER — APIXABAN 5 MG PO TABS: 5.0000 mg | ORAL_TABLET | Freq: Two times a day (BID) | ORAL | 0 refills | Status: DC

## 2018-12-26 MED ORDER — MIRTAZAPINE 15 MG PO TBDP: 15.0000 mg | ORAL_TABLET | Freq: Every day | ORAL | 0 refills | Status: DC

## 2018-12-26 MED ORDER — SENNOSIDES-DOCUSATE SODIUM 8.6-50 MG PO TABS: 2.0000 | ORAL_TABLET | Freq: Every day | ORAL | 0 refills | Status: DC

## 2018-12-26 MED ORDER — ATORVASTATIN CALCIUM 40 MG PO TABS: 40.0000 mg | ORAL_TABLET | Freq: Every day | ORAL | 1 refills | Status: AC

## 2018-12-26 NOTE — Progress Notes (Signed)
Occupational Therapy Discharge Summary  Patient Details  Name: Deanna [name] MRN: [id number] Date of Birth: 1950/09/27     Patient has met 53 of 15 long term goals due to improved activity tolerance, improved balance, postural control, ability to compensate for deficits, functional use of  LEFT upper and LEFT lower extremity, improved attention, improved awareness and improved coordination.   Patient to discharge at [facility] Assist level.  Patient's care partner is independent to provide the necessary physical and cognitive assistance at discharge.    Reasons goals not met: LTG of meal prep not addressed with actual practice.  Discussed the precautions with her family due to L field cut and inattention and decreased coordination of LUE.  Reviewed what she can safely do in the kitchen to assist with meal prep.  OT session time was needed to complete her self care as pt preferred to shower daily.    Recommendation:  Patient will benefit from ongoing skilled OT services in home health setting to continue to advance functional skills in the area of BADL and iADL.  Equipment: transfer tub bench, drop arm BSC  Reasons for discharge: treatment goals met  Patient/family agrees with progress made and goals achieved: Yes  OT Discharge Precautions/Restrictions  Precautions Precautions: Fall Precaution Comments: left inattention Restrictions Weight Bearing Restrictions: No  ADL ADL Eating: Set up Where Assessed-Eating: Chair Grooming: Independent Where Assessed-Grooming: Sitting at sink Upper Body Bathing: Supervision/safety Where Assessed-Upper Body Bathing: Shower Lower Body Bathing: Minimal assistance Where Assessed-Lower Body Bathing: Shower Upper Body Dressing: Minimal assistance Where Assessed-Upper Body Dressing: Chair Lower Body Dressing: Moderate assistance Where Assessed-Lower Body Dressing: Chair Toileting: Moderate assistance Where Assessed-Toileting: Contractor: Therapist, music Method: Counselling psychologist: Energy manager: Curator Method: Heritage manager: Radio broadcast assistant, Grab bars Vision Baseline Vision/History: Wears glasses Wears Glasses: At all times Patient Visual Report: Peripheral vision impairment Vision Assessment?: Yes Eye Alignment: Within Functional Limits Ocular Range of Motion: Within Functional Limits Alignment/Gaze Preference: Gaze right Tracking/Visual Pursuits: Decreased smoothness of eye movement to LEFT superior field;Decreased smoothness of eye movement to LEFT inferior field Visual Fields: Left visual field deficit Perception  Perception: Impaired Inattention/Neglect: Does not attend to left visual field;Does not attend to left side of body Spatial Orientation: requires min-mod cuing for L hemibody positioning during functional mobility Praxis Praxis: Impaired Praxis Impairment Details: Motor planning Praxis-Other Comments: requries min-mod cuing for sequencing of functional transfers Cognition Overall Cognitive Status: Impaired/Different from baseline Arousal/Alertness: Awake/alert Orientation Level: Oriented X4 Attention: Selective Selective Attention: Impaired(min cues needed) Selective Attention Impairment: Verbal complex;Functional complex Memory: Impaired Awareness: Impaired Awareness Impairment: Emergent impairment;Anticipatory impairment(needs mod cues with emergent awareness) Problem Solving: Impaired Problem Solving Impairment: Verbal complex;Functional complex Behaviors: Impulsive;Restless Safety/Judgment: Impaired Comments: Pt with decreased awareness of physical and memory deficits from CVA. Sensation Sensation Light Touch: Appears Intact Hot/Cold: Appears Intact Proprioception: Impaired Detail Proprioception Impaired Details: Impaired LLE;Impaired LUE Stereognosis: Impaired by gross  assessment Coordination Gross Motor Movements are Fluid and Coordinated: No Fine Motor Movements are Fluid and Coordinated: No Coordination and Movement Description: improving active ROM in UE/LE and pt can now use LUE as an active A without cuing, Finger Nose Finger Test: NT - pt not able to smoothly reach L hand to nose and then extend arm back out Motor  Motor Motor: Hemiplegia Motor - Skilled Clinical Observations: LUE and LLE hemiparesis Motor - Discharge Observations: improving LUE AROM to 40-50%  of PROM Mobility    min A with RW to ambulate to bathroom, transfer; CGA sit to stand Trunk/Postural Assessment  Postural Control Trunk Control: functional during self care ADL tasks in sitting and standing, but needs more trunk strength to allow for more rotation to enable her to pull pants over hips more easily or reach to cleanse bottom  Balance Static Sitting Balance Static Sitting - Level of Assistance: 7: Independent Dynamic Sitting Balance Dynamic Sitting - Level of Assistance: 5: Stand by assistance Static Standing Balance Static Standing - Level of Assistance: 5: Stand by assistance Dynamic Standing Balance Dynamic Standing - Level of Assistance: 4: Min assist Extremity/Trunk Assessment RUE Assessment RUE Assessment: Within Functional Limits LUE Assessment Passive Range of Motion (PROM) Comments: WFL Active Range of Motion (AROM) Comments: sh flexion and abduction to 60 degrees, elbow flex to 90, finger flex and ext [facility]   SAGUIER,JULIA 12/26/2018, 12:36 PM

## 2018-12-26 NOTE — Plan of Care (Signed)
Problem: Consults Goal: RH STROKE PATIENT EDUCATION Description: See Patient Education module for education specifics  Outcome: Completed/Met Goal: Nutrition Consult-if indicated Outcome: Completed/Met   Problem: RH BOWEL ELIMINATION Goal: RH STG MANAGE BOWEL WITH ASSISTANCE Description: STG Manage Bowel with mod Assistance. Outcome: Completed/Met Goal: RH STG MANAGE BOWEL W/MEDICATION W/ASSISTANCE Description: STG Manage Bowel with Medication with min Assistance. Outcome: Completed/Met   Problem: RH BLADDER ELIMINATION Goal: RH STG MANAGE BLADDER WITH ASSISTANCE Description: STG Manage Bladder With mod Assistance Outcome: Completed/Met   Problem: RH SKIN INTEGRITY Goal: RH STG SKIN FREE OF INFECTION/BREAKDOWN Description: Skin to remain free from breakdown while on rehab with min assist Outcome: Completed/Met   Problem: RH SAFETY Goal: RH STG ADHERE TO SAFETY PRECAUTIONS W/ASSISTANCE/DEVICE Description: STG Adhere to Safety Precautions With mod Assistance and appropriate assistive Device. Outcome: Completed/Met Goal: RH STG DECREASED RISK OF FALL WITH ASSISTANCE Description: STG Decreased Risk of Fall With mod Assistance. Outcome: Completed/Met   Problem: RH Balance Goal: LTG Patient will maintain dynamic sitting balance (PT) Description: LTG:  Patient will maintain dynamic sitting balance with assistance during mobility activities (PT) Outcome: Completed/Met Goal: LTG Patient will maintain dynamic standing balance (PT) Description: LTG:  Patient will maintain dynamic standing balance with assistance during mobility activities (PT) Outcome: Completed/Met   Problem: Sit to Stand Goal: LTG:  Patient will perform sit to stand with assistance level (PT) Description: LTG:  Patient will perform sit to stand with assistance level (PT) Outcome: Completed/Met   Problem: RH Bed Mobility Goal: LTG Patient will perform bed mobility with assist (PT) Description: LTG: Patient  will perform bed mobility with assistance, with/without cues (PT). Outcome: Completed/Met   Problem: RH Bed to Chair Transfers Goal: LTG Patient will perform bed/chair transfers w/assist (PT) Description: LTG: Patient will perform bed to chair transfers with assistance (PT). Outcome: Completed/Met   Problem: RH Car Transfers Goal: LTG Patient will perform car transfers with assist (PT) Description: LTG: Patient will perform car transfers with assistance (PT). Outcome: Completed/Met   Problem: RH Furniture Transfers Goal: LTG Patient will perform furniture transfers w/assist (OT/PT) Description: LTG: Patient will perform furniture transfers  with assistance (OT/PT). Outcome: Completed/Met   Problem: RH Floor Transfers Goal: LTG Patient will perform floor transfers w/assist (PT) Description: LTG: Patient will perform floor transfers with assistance (PT). Outcome: Completed/Met   Problem: RH Ambulation Goal: LTG Patient will ambulate in controlled environment (PT) Description: LTG: Patient will ambulate in a controlled environment, # of feet with assistance (PT). Outcome: Completed/Met Goal: LTG Patient will ambulate in home environment (PT) Description: LTG: Patient will ambulate in home environment, # of feet with assistance (PT). Outcome: Completed/Met Goal: LTG Patient will ambulate in community environment (PT) Description: LTG: Patient will ambulate in community environment, # of feet with assistance (PT). Outcome: Completed/Met   Problem: RH Wheelchair Mobility Goal: LTG Patient will propel w/c in controlled environment (PT) Description: LTG: Patient will propel wheelchair in controlled environment, # of feet with assist (PT) Outcome: Completed/Met Goal: LTG Patient will propel w/c in home environment (PT) Description: LTG: Patient will propel wheelchair in home environment, # of feet with assistance (PT). Outcome: Completed/Met Goal: LTG Patient will propel w/c in  community environment (PT) Description: LTG: Patient will propel wheelchair in community environment, # of feet with assist (PT) Outcome: Completed/Met   Problem: RH Stairs Goal: LTG Patient will ambulate up and down stairs w/assist (PT) Description: LTG: Patient will ambulate up and down # of stairs with assistance (PT) Outcome: Completed/Met  Problem: RH Attention Goal: LTG Patient will demonstrate this level of attention during functional activites (PT) Description: LTG:  Patient will demonstrate this level of attention during functional activites (PT) Outcome: Completed/Met   Problem: RH Awareness Goal: LTG: Patient will demonstrate awareness during functional activites type of (PT) Description: LTG: Patient will demonstrate awareness during functional activites type of (PT) Outcome: Completed/Met   Problem: RH Leisure Awareness Goal: LTG: Patient will participate in community (TR) Description: LTG: Patient will participate in community reintegration activities to increase ability to identify and adapt to barriers, perform living skills, ambulate/propel wheelchair at specific level  (TR) Outcome: Completed/Met Goal: LTG: Patient will participate in leisure activities (TR) Description: LTG: Patient will participate in leisure activities (simple/moderate/difficult) to increase ability to functionally perform activity, identify and utilize resources, identify new leisure interests, utilize Research scientist (life sciences) at specific level  (TR) Outcome: Completed/Met   Problem: Sit to Stand Goal: LTG:  Patient will perform sit to stand with assistance level (PT) Description: LTG:  Patient will perform sit to stand with assistance level (PT) Outcome: Completed/Met   Problem: RH Bed Mobility Goal: LTG Patient will perform bed mobility with assist (PT) Description: LTG: Patient will perform bed mobility with assistance, with/without cues (PT). Outcome: Completed/Met   Problem: RH Bed to Chair  Transfers Goal: LTG Patient will perform bed/chair transfers w/assist (PT) Description: LTG: Patient will perform bed to chair transfers with assistance (PT). Outcome: Completed/Met   Problem: RH Car Transfers Goal: LTG Patient will perform car transfers with assist (PT) Description: LTG: Patient will perform car transfers with assistance (PT). Outcome: Completed/Met   Problem: RH Ambulation Goal: LTG Patient will ambulate in controlled environment (PT) Description: LTG: Patient will ambulate in a controlled environment, # of feet with assistance (PT). Outcome: Completed/Met   Problem: RH Wheelchair Mobility Goal: LTG Patient will propel w/c in controlled environment (PT) Description: LTG: Patient will propel wheelchair in controlled environment, # of feet with assist (PT) Outcome: Completed/Met Goal: LTG Patient will propel w/c in home environment (PT) Description: LTG: Patient will propel wheelchair in home environment, # of feet with assistance (PT). Outcome: Completed/Met

## 2018-12-26 NOTE — Progress Notes (Signed)
Physical Therapy Discharge Summary  Patient Details  Name: Deanna [name] MRN: [id number] Date of Birth: 28-Oct-1950  Today's Date: 12/26/2018 PT Individual Time: 1021-1105 PT Individual Time Calculation (min): 44 min    Patient has met 4 of 4 long term goals due to improved activity tolerance, improved balance, improved postural control, increased strength, decreased pain, ability to compensate for deficits, functional use of  left upper extremity and left lower extremity, improved attention, improved awareness and improved coordination.  Patient to discharge at an ambulatory level [place].   Patient's care partners (husband and daughters) have attended family education/training sessions and are independent to provide the necessary physical assistance at discharge.  All goals met.  Recommendation:  Patient will benefit from ongoing skilled PT services in home health setting to continue to advance safe functional mobility as well as address ongoing impairments in L hemibody paresis, standing balance, gait training, L hemibody motor control/coordination, L attention, transfer training, stair navigation, and minimize fall risk.  Equipment: RW and wheelchair with cushion  Reasons for discharge: treatment goals met and discharge from hospital  Patient/family agrees with progress made and goals achieved: Yes  Skilled Therapeutic Interventions/Progress Updates:  Pt received in bathroom with her daughter assisting with toileting. Pt's husband and daughter present for additional family education/training and pt agreeable to therapy session. Therapist reinforced proper use of gait belt. Pt ambulated ~71f bathroom>w/c using RW with pt's daughter providing CGA for steadying - min cuing from therapist on proper family positioning/hand placement when providing pt assistance. Ambulated ~860fw/c>EOB (elevated to simulate height of pt's bed at home) using RW with pt's daughter providing CGA for steadying -  min cuing from therapist on pt/family sequencing when turning to EOB. Pt's daughter assisted with bed mobility including: sit>supine with min assist for L LE management and supine>sit with mod assist for trunk upright - therapist educating family on allowing pt to do as much as possible to increase her independence with bed mobility. Ambulated ~[id number] to w/c as described above with pt's daughter demonstrating proper positioning to assist pt.  Transported to/from gym in w/c. Educated pt's family on performing ambulatory car transfer rather than stand pivot without AD as there was not sufficient room for pt's family to assist her within the car door yesterday - educated pt/family on safe set-up of ambulatory car transfer including ensuring level surface for ambulation, not having a curb step involved, and proper set-up of w/c, car, and RW. Pt performed ambulatory car transfer w/c>simulated SUV height using RW with pt's daughter providing CGA/min assist for balance and L LE management in/out of car - min cuing from therapist for sequencing and pt/family demonstrated understanding. Transported to main therapy gym. Therapist reinforced proper set-up prior to initiating stair navigation. Pt ascended/descend 2 steps using L HR and side-step technique with pt's daughter providing min assist for balance and L LE management on descent - only minimal cuing from therapist for set-up and technique with pt/family demonstrating understanding. Pt ambulated ~31f84fing RW with pt's daughter providing proper CGA for safety/steadying. Pt/family report after today's education session they feel competent to assist pt for all functional mobility at home. Transported back to room in w/c and pt left sitting in w/c in the care of her family and nursing staff notified.  PT Discharge Precautions/Restrictions Precautions Precautions: Fall Precaution Comments: left inattention Restrictions Weight Bearing Restrictions: No Pain Pain  Assessment Pain Scale: 0-10 Pain Score: 0-No pain Pain Type: Acute pain Pain Location: Shoulder Pain Orientation:  Left Pain Descriptors / Indicators: Aching;Discomfort Pain Frequency: Intermittent Pain Onset: Gradual Patients Stated Pain Goal: 2 Pain Intervention(s): Medication (See eMAR) Vision/Perception  Perception Perception: Impaired Inattention/Neglect: Does not attend to left visual field;Does not attend to left side of body Spatial Orientation: requires min-mod cuing for L hemibody positioning during functional mobility Praxis Praxis: Impaired Praxis Impairment Details: Motor planning Praxis-Other Comments: requries min-mod cuing for sequencing of functional transfers  Cognition Overall Cognitive Status: Impaired/Different from baseline Arousal/Alertness: Awake/alert Orientation Level: Oriented X4 Memory: Impaired Awareness: Impaired Awareness Impairment: Emergent impairment;Anticipatory impairment(needs mod cues with emergent awareness) Safety/Judgment: Impaired Sensation Sensation Light Touch: Impaired by gross assessment Light Touch Impaired Details: Impaired LUE;Impaired LLE Hot/Cold: Not tested Proprioception: Impaired by gross assessment Proprioception Impaired Details: Impaired LUE;Impaired LLE(improving proprioceptive awareness of L hemibody location during functional transfers but continues to require min/mod cuing) Stereognosis: Not tested Coordination Gross Motor Movements are Fluid and Coordinated: No Coordination and Movement Description: gross movements impaired due to L hemibody inattention, L hemibody paresis, and L hemibody proprioceptive impairments Heel Shin Test: L LE impaired due to paresis and motor control/coordination Motor  Motor Motor: Hemiplegia;Motor apraxia;Abnormal postural alignment and control Motor - Skilled Clinical Observations: LUE and LLE hemiparesis Motor - Discharge Observations: improving but still with L hemibody hemiparesis   Mobility Bed Mobility Bed Mobility: Supine to Sit;Sit to Supine Supine to Sit: Moderate Assistance - Patient 50-74% Sit to Supine: Minimal Assistance - Patient > 75% Transfers Transfers: Sit to Stand;Stand to Sit;Stand Pivot Transfers Sit to Stand: Contact Guard/Touching assist Stand to Sit: Contact Guard/Touching assist Stand Pivot Transfers: Contact Guard/Touching assist Transfer (Assistive device): Rolling walker Locomotion  Gait Ambulation: Yes Gait Assistance: Contact Guard/Touching assist Gait Distance (Feet): 55 Feet Assistive device: Rolling walker Gait Gait: Yes Gait Pattern: Impaired Gait Pattern: Step-to pattern;Decreased step length - left;Decreased step length - right;Decreased stance time - left;Left circumduction;Lateral trunk lean to right;Poor foot clearance - left;Wide base of support Gait velocity: decreased Stairs / Additional Locomotion Stairs: Yes Stairs Assistance: Minimal Assistance - Patient > 75% Stair Management Technique: One rail Left Number of Stairs: 2 Height of Stairs: 6 Wheelchair Mobility Wheelchair Mobility: No  Trunk/Postural Assessment  Cervical Assessment Cervical Assessment: Exceptions to WFL(forward head) Thoracic Assessment Thoracic Assessment: Exceptions to WFL(rounded shoulders) Lumbar Assessment Lumbar Assessment: Exceptions to WFL(posterior pelvic tilt) Postural Control Postural Control: Deficits on evaluation Trunk Control: improving but still impaired Protective Responses: improving but still impaired Postural Limitations: improving but still decreased  Balance Balance Balance Assessed: Yes Static Sitting Balance Static Sitting - Balance Support: Feet supported Static Sitting - Level of Assistance: 7: Independent Dynamic Sitting Balance Dynamic Sitting - Balance Support: During functional activity;Feet supported Dynamic Sitting - Level of Assistance: 5: Stand by assistance Static Standing Balance Static Standing -  Balance Support: During functional activity;Bilateral upper extremity supported Static Standing - Level of Assistance: 4: Min assist Dynamic Standing Balance Dynamic Standing - Balance Support: During functional activity;Bilateral upper extremity supported Dynamic Standing - Level of Assistance: 4: Min assist Extremity Assessment      RLE Assessment RLE Assessment: Within Functional Limits LLE Assessment LLE Assessment: Exceptions to [facility] Strength Comments: 3/5 hip flex, 3+/5 knee flex/ext, 2-/5 ankle DF, 2/5 ankle PF    Tawana Scale, PT, DPT 12/26/2018, 7:55 AM

## 2018-12-26 NOTE — Progress Notes (Signed)
Speech Language Pathology Discharge Summary  Patient Details  Name: Deanna [name] MRN: [id number] Date of Birth: August 09, 1950  Today's Date: 12/26/2018 SLP Individual Time: 0930-1013 SLP Individual Time Calculation (min): 43 min   Skilled Therapeutic Interventions:  Skilled treatment session focused on completion of caregiver education with pt, her husband and her daughter Jeannetta Nap). SLP provided handout outlining compensatory memory strategies, SLP further reviewed safety awareness with patient and answered all questions within SLP scope of practice. Pt's family had questions regarding DVT, SLP defered to PA for discharge instructions regarding DVT. All education completed and all questions answered to everyone's satisfaction.    Patient has met 4 of 4 long term goals.  Patient to discharge at [facility] level.  Reasons goals not met:   N/A  Clinical Impression/Discharge Summary:   Pt has made god progress over the course of skilled ST. Currently she is Min a for semi-complex problem solving. This includes deficits in selective attention, reasoning, sequencing higher level communication, insight into safety awareness and inability to inhibit verbosity. All education has been completed, recommend follow up ST services to target the above mentioned within functional setting of home and community.   Care Partner:  Caregiver Able to Provide Assistance: Yes  Type of Caregiver Assistance: Physical;Cognitive  Recommendation:  24 hour supervision/assistance;Home Health SLP;Outpatient SLP  Rationale for SLP Follow Up: Maximize cognitive function and independence;Reduce caregiver burden   Equipment:   N/A  Reasons for discharge: Treatment goals met   Patient/Family Agrees with Progress Made and Goals Achieved: Yes    Sua Spadafora 12/26/2018, 12:10 PM

## 2018-12-26 NOTE — Progress Notes (Signed)
Occupational Therapy Session Note  Patient Details  Name: Deanna [name] MRN: [id number] Date of Birth: 21-Oct-1950  Today's Date: 12/26/2018 OT Individual Time: 2633-3545 OT Individual Time Calculation (min): 45 min    Short Term Goals: Week 3:  OT Short Term Goal 1 (Week 3): current goals = LTGs  Skilled Therapeutic Interventions/Progress Updates:    Pt seen for ADL training with continued family education with her spouse and daughter. Pt was on the toilet at the start of the session.  Since they were here yesterday and observed all the ADL tasks, had the daughter practice walking her mom from the toilet to the shower and from the shower to the bed.  Her daughter assisted her mom with bathing with min A only to wash bottom.  From EOB pt dressed,  She needed a little more A with pants today mostly because she was distracted and anxious about "performing" for her family and leaving today.  Overall, pt did very well.   Reviewed and demonstrated recommended home exercises for her LUE.   Pt in room with family.    Therapy Documentation Precautions:  Precautions Precautions: Fall Precaution Comments: left neglect Restrictions Weight Bearing Restrictions: No Pain: Pain Assessment Pain Scale: 0-10 Pain Score: 0-No pain Faces Pain Scale: No hurt ADL: ADL Eating: Set up Where Assessed-Eating: Chair Grooming: Independent Where Assessed-Grooming: Sitting at sink Upper Body Bathing: Supervision/safety Where Assessed-Upper Body Bathing: Shower Lower Body Bathing: Minimal assistance Where Assessed-Lower Body Bathing: Shower Upper Body Dressing: Minimal assistance Where Assessed-Upper Body Dressing: Chair Lower Body Dressing: Moderate assistance Where Assessed-Lower Body Dressing: Chair Toileting: Moderate assistance Where Assessed-Toileting: Glass blower/designer: Therapist, music Method: Counselling psychologist: Energy manager: Agricultural engineer Method: Heritage manager: Radio broadcast assistant, Grab bars   Therapy/Group: Individual Therapy  [place] 12/26/2018, 9:47 AM

## 2018-12-26 NOTE — Progress Notes (Signed)
[facility] PHYSICAL MEDICINE & REHABILITATION PROGRESS NOTE   Subjective/Complaints:  No issues overnite, excited about d/c, bladder and bowels doing ok   ROS: Patient denies nausea, vomiting, diarrhea, cough, shortness of breath or chest pain, joint  + back pain but at baseline  objective:   No results found. No results for input(s): WBC, HGB, HCT, PLT in the last 72 hours. No results for input(s): NA, K, CL, CO2, GLUCOSE, BUN, CREATININE, CALCIUM in the last 72 hours.  Intake/Output Summary (Last 24 hours) at 12/26/2018 0719 Last data filed at 12/25/2018 2006 Gross per 24 hour  Intake 1140 ml  Output -  Net 1140 ml     Physical Exam: Vital Signs Blood pressure (!) 174/86, pulse 82, temperature (!) 97.4 F (36.3 C), temperature source Oral, resp. rate 20, height 5\' 1"  (1.549 m), weight 107.2 kg, SpO2 96 %. Constitutional: No distress . Vital signs reviewed. HEENT: EOMI, oral membranes moist Neck: supple Cardiovascular: RRR without murmur. No JVD    Respiratory: CTA Bilaterally without wheezes or rales. Normal effort    GI: BS +, non-tender, non-distended   Skin: Warm and dry.  Intact. Psych: pleasant Musc: no pain with cervical ROM  Neurologic: Alert, good insight and awareness. Left central 7 Motor:  LUE: 3 to 3+/5 LLE: 4-/5--no changes  Assessment/Plan: 1. 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  Stable for D/C today F/u PCP in 3-4 weeks F/u PM&R 2 weeks See D/C summary See D/C instructions Care Tool:  Bathing    Body parts bathed by patient: Right arm, Left arm, Chest, Abdomen, Right upper leg, Left upper leg, Face, Front perineal area, Right lower leg, Left lower leg   Body parts bathed by helper: Buttocks     Bathing assist Assist Level: Minimal Assistance - Patient > 75%     Upper Body Dressing/Undressing Upper body dressing   What is the patient wearing?: Pull over shirt, Bra    Upper body assist Assist  Level: Minimal Assistance - Patient > 75%    Lower Body Dressing/Undressing Lower body dressing      What is the patient wearing?: Pants, Underwear/pull up     Lower body assist Assist for lower body dressing: Minimal Assistance - Patient > 75%     Toileting Toileting    Toileting assist Assist for toileting: Minimal Assistance - Patient > 75%     Transfers Chair/bed transfer  Transfers assist     Chair/bed transfer assist level: Minimal Assistance - Patient > 75%     Locomotion Ambulation   Ambulation assist   Ambulation activity did not occur: Safety/medical concerns  Assist level: Minimal Assistance - Patient > 75% Assistive device: Walker-rolling Max distance: 92ft   Walk 10 feet activity   Assist  Walk 10 feet activity did not occur: Safety/medical concerns  Assist level: Minimal Assistance - Patient > 75% Assistive device: Walker-rolling   Walk 50 feet activity   Assist Walk 50 feet with 2 turns activity did not occur: Safety/medical concerns  Assist level: Minimal Assistance - Patient > 75% Assistive device: Walker-rolling    Walk 150 feet activity   Assist Walk 150 feet activity did not occur: Safety/medical concerns         Walk 10 feet on uneven surface  activity   Assist Walk 10 feet on uneven surfaces activity did not occur: Safety/medical concerns         Wheelchair     Assist Will patient use  wheelchair at discharge?: Yes Type of Wheelchair: Manual    Wheelchair assist level: Maximal Assistance - Patient 25 - 49% Max wheelchair distance: 75ft    Wheelchair 50 feet with 2 turns activity    Assist    Wheelchair 50 feet with 2 turns activity did not occur: Safety/medical concerns       Wheelchair 150 feet activity     Assist Wheelchair 150 feet activity did not occur: Safety/medical concerns        Medical Problem List and Plan: 1.Left hemiparesis, dysphagia  and functional deficitssecondary  to large right MCA infarct  Continue CIR PT, OT, SLP,plan d/c today  2. Antithrombotics: -DVT/anticoagulation:  Sequential compression devices, below kneeBilateral lower extremities DC'd   Left post tibial DVT extended to L popliteal vein, now felt to be acute with propagation, continue Eliquis, no signs of bleeding   Hgbs stable and normal   -antiplatelet therapy: Low dose ASA. 3.OA bilateral knees/Pain Management:tylenol prn.  Voltaren gel to bilateral knees. Chronic neck pain CT neck 7/20 C5-6, C6-7 DDD 4. Mood:LCSW to follow for evaluation and support.  -antipsychotic agents: N/A 5. Neuropsych: This patientis not fully capable of making decisions on onown behalf. 6. Skin/Wound Care:Routine pressure relief measures. 7. Fluids/Electrolytes/Nutrition:Monitor I/O.   BMP ordered for Monday  8.HTN: Monitor BP.  Avoid hypoperfusion Vitals:   12/25/18 1953 12/26/18 0452  BP: (!) 106/53 (!) 174/86  Pulse: 87 82  Resp: 20 20  Temp: (!) 97.5 F (36.4 C) (!) 97.4 F (36.3 C)  SpO2: 98% 96%   Controlled 8/14  9. UTI: R to Macrobid , no Allergies, continue amoxicillin until 8/3                     10. Prediabetes with hyperglycemia: Hgb A1c-5.9. CM diet.   Reasonable control 8/14  No need for CBG monitoring  CBG (last 3)  Recent Labs    12/24/18 1711 12/24/18 2122 12/25/18 0608  GLUCAP 140* 143* 91   11. Morbid obesity: Educate patient on CM/HH diet and importance of weight loss to help promote health and mobility.  12. Dyslipidemia: On Atorvastatin 13.  Neurogenic bladder  Cont flomax and urecholine  With frequency at times   14.  Constipation:   Improving with daily Miralax   15.  Left neck pain improved  CT showing DDD C5-6,6-7- Tingling LUE likely CVA related , as it is non dermatomal   LOS: 24 days A FACE TO FACE EVALUATION WAS PERFORMED  Charlett Blake 12/26/2018, 7:19 AM

## 2018-12-27 DIAGNOSIS — I700 Atherosclerosis of aorta: Secondary | ICD-10-CM | POA: Diagnosis not present

## 2018-12-27 DIAGNOSIS — D72829 Elevated white blood cell count, unspecified: Secondary | ICD-10-CM | POA: Diagnosis not present

## 2018-12-27 DIAGNOSIS — M549 Dorsalgia, unspecified: Secondary | ICD-10-CM | POA: Diagnosis not present

## 2018-12-27 DIAGNOSIS — Z1159 Encounter for screening for other viral diseases: Secondary | ICD-10-CM | POA: Diagnosis not present

## 2018-12-27 DIAGNOSIS — Z6841 Body Mass Index (BMI) 40.0 and over, adult: Secondary | ICD-10-CM | POA: Diagnosis not present

## 2018-12-27 DIAGNOSIS — K219 Gastro-esophageal reflux disease without esophagitis: Secondary | ICD-10-CM | POA: Diagnosis not present

## 2018-12-27 DIAGNOSIS — I69328 Other speech and language deficits following cerebral infarction: Secondary | ICD-10-CM | POA: Diagnosis not present

## 2018-12-27 DIAGNOSIS — R339 Retention of urine, unspecified: Secondary | ICD-10-CM | POA: Diagnosis not present

## 2018-12-27 DIAGNOSIS — E785 Hyperlipidemia, unspecified: Secondary | ICD-10-CM | POA: Diagnosis not present

## 2018-12-27 DIAGNOSIS — Z7901 Long term (current) use of anticoagulants: Secondary | ICD-10-CM | POA: Diagnosis not present

## 2018-12-27 DIAGNOSIS — Z9851 Tubal ligation status: Secondary | ICD-10-CM | POA: Diagnosis not present

## 2018-12-27 DIAGNOSIS — M170 Bilateral primary osteoarthritis of knee: Secondary | ICD-10-CM | POA: Diagnosis not present

## 2018-12-27 DIAGNOSIS — Z9181 History of falling: Secondary | ICD-10-CM | POA: Diagnosis not present

## 2018-12-27 DIAGNOSIS — I69354 Hemiplegia and hemiparesis following cerebral infarction affecting left non-dominant side: Secondary | ICD-10-CM | POA: Diagnosis not present

## 2018-12-27 DIAGNOSIS — J45909 Unspecified asthma, uncomplicated: Secondary | ICD-10-CM | POA: Diagnosis not present

## 2018-12-27 DIAGNOSIS — Z9049 Acquired absence of other specified parts of digestive tract: Secondary | ICD-10-CM | POA: Diagnosis not present

## 2018-12-27 DIAGNOSIS — Z7982 Long term (current) use of aspirin: Secondary | ICD-10-CM | POA: Diagnosis not present

## 2018-12-27 DIAGNOSIS — H538 Other visual disturbances: Secondary | ICD-10-CM | POA: Diagnosis not present

## 2018-12-27 DIAGNOSIS — R7303 Prediabetes: Secondary | ICD-10-CM | POA: Diagnosis not present

## 2018-12-27 DIAGNOSIS — I10 Essential (primary) hypertension: Secondary | ICD-10-CM | POA: Diagnosis not present

## 2018-12-27 DIAGNOSIS — G8929 Other chronic pain: Secondary | ICD-10-CM | POA: Diagnosis not present

## 2018-12-27 DIAGNOSIS — Z8781 Personal history of (healed) traumatic fracture: Secondary | ICD-10-CM | POA: Diagnosis not present

## 2018-12-29 DIAGNOSIS — I69328 Other speech and language deficits following cerebral infarction: Secondary | ICD-10-CM | POA: Diagnosis not present

## 2018-12-29 DIAGNOSIS — I69354 Hemiplegia and hemiparesis following cerebral infarction affecting left non-dominant side: Secondary | ICD-10-CM | POA: Diagnosis not present

## 2018-12-29 DIAGNOSIS — J45909 Unspecified asthma, uncomplicated: Secondary | ICD-10-CM | POA: Diagnosis not present

## 2018-12-29 DIAGNOSIS — M170 Bilateral primary osteoarthritis of knee: Secondary | ICD-10-CM | POA: Diagnosis not present

## 2018-12-29 DIAGNOSIS — I10 Essential (primary) hypertension: Secondary | ICD-10-CM | POA: Diagnosis not present

## 2018-12-30 DIAGNOSIS — I69354 Hemiplegia and hemiparesis following cerebral infarction affecting left non-dominant side: Secondary | ICD-10-CM | POA: Diagnosis not present

## 2018-12-30 DIAGNOSIS — M170 Bilateral primary osteoarthritis of knee: Secondary | ICD-10-CM | POA: Diagnosis not present

## 2018-12-30 DIAGNOSIS — I69328 Other speech and language deficits following cerebral infarction: Secondary | ICD-10-CM | POA: Diagnosis not present

## 2018-12-30 DIAGNOSIS — J45909 Unspecified asthma, uncomplicated: Secondary | ICD-10-CM | POA: Diagnosis not present

## 2018-12-30 DIAGNOSIS — I10 Essential (primary) hypertension: Secondary | ICD-10-CM | POA: Diagnosis not present

## 2018-12-30 NOTE — Progress Notes (Signed)
Social Work Discharge Note   The overall goal for the admission was met for:   Discharge location: Yes - home with family  Length of Stay: Yes - 24 days  Discharge activity level: Yes - minimum to moderate assistance  Home/community participation: Yes  Services provided included: MD, RD, PT, OT, SLP, RN, Pharmacy, Neuropsych and SW  Financial Services: Medicare and Private Insurance: [place]  Follow-up services arranged: Home Health: PT/OT/ST/CNA from [facility], DME: 20"x18" wheelchair with basic cushion; wide rolling walker; drop arm commode; tub transfer bench from [employer] and Patient/Family has no preference for HH/DME agencies  Comments (or additional information):  Pt's family came for family education and they are prepared to take care of pt at home.  Myrtle Haller - husband - [phone]  Patient/Family verbalized understanding of follow-up arrangements: Yes  Individual responsible for coordination of the follow-up plan: pt's husband and three adult dtrs  Confirmed correct DME delivered: Trey Sailors 12/30/2018    Kaius Daino, Silvestre Mesi

## 2018-12-31 DIAGNOSIS — M170 Bilateral primary osteoarthritis of knee: Secondary | ICD-10-CM | POA: Diagnosis not present

## 2018-12-31 DIAGNOSIS — I69354 Hemiplegia and hemiparesis following cerebral infarction affecting left non-dominant side: Secondary | ICD-10-CM | POA: Diagnosis not present

## 2018-12-31 DIAGNOSIS — I10 Essential (primary) hypertension: Secondary | ICD-10-CM | POA: Diagnosis not present

## 2018-12-31 DIAGNOSIS — J45909 Unspecified asthma, uncomplicated: Secondary | ICD-10-CM | POA: Diagnosis not present

## 2018-12-31 DIAGNOSIS — I69328 Other speech and language deficits following cerebral infarction: Secondary | ICD-10-CM | POA: Diagnosis not present

## 2019-01-01 DIAGNOSIS — I69328 Other speech and language deficits following cerebral infarction: Secondary | ICD-10-CM | POA: Diagnosis not present

## 2019-01-01 DIAGNOSIS — J45909 Unspecified asthma, uncomplicated: Secondary | ICD-10-CM | POA: Diagnosis not present

## 2019-01-01 DIAGNOSIS — I69354 Hemiplegia and hemiparesis following cerebral infarction affecting left non-dominant side: Secondary | ICD-10-CM | POA: Diagnosis not present

## 2019-01-01 DIAGNOSIS — M170 Bilateral primary osteoarthritis of knee: Secondary | ICD-10-CM | POA: Diagnosis not present

## 2019-01-01 DIAGNOSIS — I10 Essential (primary) hypertension: Secondary | ICD-10-CM | POA: Diagnosis not present

## 2019-01-02 ENCOUNTER — Telehealth: Payer: Self-pay

## 2019-01-02 DIAGNOSIS — G479 Sleep disorder, unspecified: Secondary | ICD-10-CM | POA: Diagnosis not present

## 2019-01-02 DIAGNOSIS — I639 Cerebral infarction, unspecified: Secondary | ICD-10-CM | POA: Diagnosis not present

## 2019-01-02 DIAGNOSIS — I82432 Acute embolism and thrombosis of left popliteal vein: Secondary | ICD-10-CM | POA: Diagnosis not present

## 2019-01-02 NOTE — Telephone Encounter (Signed)
1st attempt made, no answer, left message to call back [place]  Patient Name: Deanna [name] DOB: 07-07-1950 Appointment Date and Time: 01-15-2019 / 1120am With: Zella Ball first then Dr. Posey Pronto  Questions for our staff to ask patients on Transitional care 48 hour phone call:   1. Are you/is patient experiencing any problems since coming home? Are there any questions regarding any aspect of care?   2. Are there any questions regarding medications administration/dosing? Are meds being taken as prescribed? Patient should review meds with caller to confirm   3. Have there been any falls?   4. Has Home Health been to the house and/or have they contacted you? If not, have you tried to contact them? Can we help you contact them?   5. Are bowels and bladder emptying properly? Are there any unexpected incontinence issues? If applicable, is patient following bowel/bladder programs?   6. Any fevers, problems with breathing, unexpected pain?   7. Are there any skin problems or new areas of breakdown?   8. Has the patient/family member arranged specialty MD follow up (ie cardiology/neurology/renal/surgical/etc)? Can we help arrange?   9. Does the patient need any other services or support that we can help arrange?   10. Are caregivers following through as expected in assisting the patient?   11. Has the patient quit smoking, drinking alcohol, or using drugs as recommended?   [place] Lebam [phone]

## 2019-01-05 ENCOUNTER — Ambulatory Visit (INDEPENDENT_AMBULATORY_CARE_PROVIDER_SITE_OTHER): Payer: Medicare Other | Admitting: *Deleted

## 2019-01-05 DIAGNOSIS — I639 Cerebral infarction, unspecified: Secondary | ICD-10-CM

## 2019-01-06 DIAGNOSIS — I10 Essential (primary) hypertension: Secondary | ICD-10-CM | POA: Diagnosis not present

## 2019-01-06 DIAGNOSIS — I69354 Hemiplegia and hemiparesis following cerebral infarction affecting left non-dominant side: Secondary | ICD-10-CM | POA: Diagnosis not present

## 2019-01-06 DIAGNOSIS — I69328 Other speech and language deficits following cerebral infarction: Secondary | ICD-10-CM | POA: Diagnosis not present

## 2019-01-06 DIAGNOSIS — J45909 Unspecified asthma, uncomplicated: Secondary | ICD-10-CM | POA: Diagnosis not present

## 2019-01-06 DIAGNOSIS — M170 Bilateral primary osteoarthritis of knee: Secondary | ICD-10-CM | POA: Diagnosis not present

## 2019-01-06 LAB — CUP PACEART REMOTE DEVICE CHECK
Date Time Interrogation Session: 20200824111407
Implantable Pulse Generator Implant Date: 20200721

## 2019-01-07 DIAGNOSIS — M170 Bilateral primary osteoarthritis of knee: Secondary | ICD-10-CM | POA: Diagnosis not present

## 2019-01-07 DIAGNOSIS — I69328 Other speech and language deficits following cerebral infarction: Secondary | ICD-10-CM | POA: Diagnosis not present

## 2019-01-07 DIAGNOSIS — J45909 Unspecified asthma, uncomplicated: Secondary | ICD-10-CM | POA: Diagnosis not present

## 2019-01-07 DIAGNOSIS — I69354 Hemiplegia and hemiparesis following cerebral infarction affecting left non-dominant side: Secondary | ICD-10-CM | POA: Diagnosis not present

## 2019-01-07 DIAGNOSIS — I10 Essential (primary) hypertension: Secondary | ICD-10-CM | POA: Diagnosis not present

## 2019-01-08 ENCOUNTER — Telehealth: Payer: Self-pay

## 2019-01-08 NOTE — Telephone Encounter (Signed)
Received a complaint from husband regarding not being able to see MD - explained hospital follow up due to MD rounding.  He states he never spoke to doctor at hospital - he states he left 3 voicemails for return call on #5 clinic line after bruce left original message. He will see eunice first then on to [place].

## 2019-01-12 NOTE — Progress Notes (Signed)
[facility] Summary Report / Loop Recorder 

## 2019-01-13 DIAGNOSIS — J45909 Unspecified asthma, uncomplicated: Secondary | ICD-10-CM | POA: Diagnosis not present

## 2019-01-13 DIAGNOSIS — I10 Essential (primary) hypertension: Secondary | ICD-10-CM | POA: Diagnosis not present

## 2019-01-13 DIAGNOSIS — I69354 Hemiplegia and hemiparesis following cerebral infarction affecting left non-dominant side: Secondary | ICD-10-CM | POA: Diagnosis not present

## 2019-01-13 DIAGNOSIS — M170 Bilateral primary osteoarthritis of knee: Secondary | ICD-10-CM | POA: Diagnosis not present

## 2019-01-13 DIAGNOSIS — I69328 Other speech and language deficits following cerebral infarction: Secondary | ICD-10-CM | POA: Diagnosis not present

## 2019-01-14 DIAGNOSIS — M170 Bilateral primary osteoarthritis of knee: Secondary | ICD-10-CM | POA: Diagnosis not present

## 2019-01-14 DIAGNOSIS — J45909 Unspecified asthma, uncomplicated: Secondary | ICD-10-CM | POA: Diagnosis not present

## 2019-01-14 DIAGNOSIS — I10 Essential (primary) hypertension: Secondary | ICD-10-CM | POA: Diagnosis not present

## 2019-01-14 DIAGNOSIS — I69328 Other speech and language deficits following cerebral infarction: Secondary | ICD-10-CM | POA: Diagnosis not present

## 2019-01-14 DIAGNOSIS — I69354 Hemiplegia and hemiparesis following cerebral infarction affecting left non-dominant side: Secondary | ICD-10-CM | POA: Diagnosis not present

## 2019-01-15 ENCOUNTER — Encounter: Payer: Self-pay | Admitting: Registered Nurse

## 2019-01-15 ENCOUNTER — Encounter: Payer: Medicare Other | Attending: Registered Nurse | Admitting: Registered Nurse

## 2019-01-15 ENCOUNTER — Other Ambulatory Visit: Payer: Self-pay

## 2019-01-15 VITALS — BP 130/76 | HR 93 | Temp 98.2°F | Ht 61.0 in | Wt 234.4 lb

## 2019-01-15 DIAGNOSIS — I82442 Acute embolism and thrombosis of left tibial vein: Secondary | ICD-10-CM

## 2019-01-15 DIAGNOSIS — I639 Cerebral infarction, unspecified: Secondary | ICD-10-CM

## 2019-01-15 DIAGNOSIS — R7303 Prediabetes: Secondary | ICD-10-CM

## 2019-01-15 DIAGNOSIS — I82433 Acute embolism and thrombosis of popliteal vein, bilateral: Secondary | ICD-10-CM | POA: Diagnosis not present

## 2019-01-15 DIAGNOSIS — I63511 Cerebral infarction due to unspecified occlusion or stenosis of right middle cerebral artery: Secondary | ICD-10-CM

## 2019-01-15 NOTE — Progress Notes (Signed)
Subjective:    Patient ID: Deanna [name], female    DOB: 30-Jan-1951, 68 y.o.   MRN: [id number]  HPI: Deanna [name] is a 68 y.o. female who is here for Transitional Care visit for follow up of her acute ischemic right MCA stroke, acute DVT of left tibial vein, acute DVT of popliteal veins and prediabetes. Deanna [name] was brought to [facility] via EMS on 11/29/2018, she had left sided tingling and right gaze. Neurology was consulted, she receive IV TPA.  CT Head: CT Cerebral Perfusion IMPRESSION: 1. Acute posterior right MCA infarct with hyperdense right M2 branches. No hemorrhage. 2. ASPECTS is 6. CT Angio Neck:  IMPRESSION: 1. Proximal right M2 occlusion. 2. Associated acute right MCA infarct with penumbra on CTP as above. 3. Moderate cervical carotid artery atherosclerosis without significant stenosis. MR Brain:  IMPRESSION: 1. Moderate-sized acute right MCA infarct with confluent petechial hemorrhage. 2. One or two punctate acute left frontoparietal infarcts. 3. Mild chronic small vessel ischemic disease.  CT Head WO Contrast:  IMPRESSION: 1. Redemonstrated large edematous infarction of the posterior right MCA territory, edema and hypodensity substantially increased compared to prior examination dated 11/29/2018.  2. There is new hemorrhagic conversion anteriorly, noted in the deep white matter of the posterior frontal and temporal lobes about the insula (series 7, image 15).  3.  No significant midline shift or evidence of downward herniation.  She underwent Cerebral Angio with endovascular of right MCA on 11/29/2018 by Dr. Estanislado Pandy.   She underwent Loop Recorder Placement on 12/02/2018 by Dr. Lovena Le.   Surveillance Doppler showed DVT  left posterior tibial vein. Repeat Doppler showed DVT in left popliteal vein she was started on low dose heaprin and transitioned to Eliquis.   Deanna [name] was admitted to inpatient rehabilitation on 12/02/2018 and discharged  home on 12/26/2018. She is receiving outpatient therapy with [facility].   Deanna [name] denies any pain. She rated her pain 0. Also reports she has a good appetite.   Daughter in room all questions answered.   Pain Inventory Average Pain 0 Pain Right Now 0 My pain is n/a  In the last 24 hours, has pain interfered with the following? General activity n/a Relation with others n/a Enjoyment of life n/a What TIME of day is your pain at its worst? n/a Sleep (in general) Fair  Pain is worse with: n/a Pain improves with: n/a Relief from Meds: n/a  Mobility use a walker how many minutes can you walk? 10 do you drive?  no needs help with transfers Do you have any goals in this area?  yes  Function retired I need assistance with the following:  dressing, bathing, meal prep, household duties and shopping Do you have any goals in this area?  yes  Neuro/Psych weakness trouble walking loss of taste or smell  Prior Studies Any changes since last visit?  no  Physicians involved in your care Any changes since last visit?  no   Family History  Problem Relation Age of Onset  . Stroke Father   . Stroke Brother    Social History   Socioeconomic History  . Marital status: Married    Spouse name: Not on file  . Number of children: Not on file  . Years of education: Not on file  . Highest education level: Not on file  Occupational History  . Not on file  Social Needs  . Financial resource strain: Not on file  . Food insecurity  Worry: Not on file    Inability: Not on file  . Transportation needs    Medical: Not on file    Non-medical: Not on file  Tobacco Use  . Smoking status: Never Smoker  . Smokeless tobacco: Never Used  Substance and Sexual Activity  . Alcohol use: No  . Drug use: No  . Sexual activity: Not on file  Lifestyle  . Physical activity    Days per week: Not on file    Minutes per session: Not on file  . Stress: Not on file   Relationships  . Social Herbalist on phone: Not on file    Gets together: Not on file    Attends religious service: Not on file    Active member of club or organization: Not on file    Attends meetings of clubs or organizations: Not on file    Relationship status: Not on file  Other Topics Concern  . Not on file  Social History Narrative  . Not on file   Past Surgical History:  Procedure Laterality Date  . BUBBLE STUDY  12/02/2018   Procedure: BUBBLE STUDY;  Surgeon: Elouise Munroe, MD;  Location: [place];  Service: Cardiology;;  . CHOLECYSTECTOMY    . CHONDROPLASTY Right 07/09/2017   Procedure: CHONDROPLASTY;  Surgeon: Sydnee Cabal, MD;  Location: [facility];  Service: Orthopedics;  Laterality: Right;  . HERNIA REPAIR Right age 33   inguinal  . IR CT HEAD LTD  11/29/2018  . IR PERCUTANEOUS ART THROMBECTOMY/INFUSION INTRACRANIAL INC DIAG ANGIO  11/29/2018  . KNEE ARTHROSCOPY WITH MEDIAL MENISECTOMY Left 01/10/2017   Procedure: LEFT KNEE ARTHROSCOPY, DEBRIDEMENT, PARTIAL MEDIAL MENISCECTOMY, CHONDROPLASTY, ARTHROSCOPIC ASSISTED INTERNAL FIXATION OF MEDIAL FEMORAL CONDYLE AND MEDIAL TIBIAL PLATEAU INSUFFICIENCY FRACTURES;  Surgeon: Sydnee Cabal, MD;  Location: [place];  Service: Orthopedics;  Laterality: Left;  . KNEE ARTHROSCOPY WITH MEDIAL MENISECTOMY Right 07/09/2017   Procedure: Right knee scope, debridement, chondroplasty, partial meniscectomy, internal arthroscopic assisted internal fixation of medial tibial plateau fracture;  Surgeon: Sydnee Cabal, MD;  Location: [facility];  Service: Orthopedics;  Laterality: Right;  . LOOP RECORDER INSERTION N/A 12/02/2018   Procedure: LOOP RECORDER INSERTION;  Surgeon: Evans Lance, MD;  Location: [place] [facility];  Service: Cardiovascular;  Laterality: N/A;  . RADIOLOGY WITH ANESTHESIA N/A 11/29/2018   Procedure: RADIOLOGY WITH ANESTHESIA;  Surgeon: Luanne Bras, MD;  Location: [place];  Service: Radiology;  Laterality: N/A;  . TEE WITHOUT CARDIOVERSION N/A 12/02/2018   Procedure: TRANSESOPHAGEAL ECHOCARDIOGRAM (TEE);  Surgeon: Elouise Munroe, MD;  Location: [place];  Service: Cardiology;  Laterality: N/A;  . TUBAL LIGATION  age 15   Past Medical History:  Diagnosis Date  . Arthritis    osteoarthritis in  both knees  . Asthma   . Difficult intravenous access   . GERD (gastroesophageal reflux disease)   . Heart murmur    born with years ago  . Pre-diabetes    last A1C=6.0 per pt  . Torn meniscus    left knee w fracture x2 to left knee  . Wears glasses    BP 130/76   Pulse 93   Temp 98.2 F (36.8 C)   Ht 5\' 1"  (1.549 m)   Wt 234 lb 6.4 oz (106.3 kg)   LMP  (LMP Unknown)   SpO2 94%   BMI 44.29 kg/m   Opioid Risk Score:   Fall Risk Score:  `  1  Depression screen PHQ 2/9  No flowsheet data found.  Review of Systems  Constitutional: Negative.   HENT: Negative.   Eyes: Negative.   Respiratory: Negative.   Cardiovascular: Negative.   Gastrointestinal: Negative.   Endocrine: Negative.   Genitourinary: Negative.   Musculoskeletal: Positive for gait problem.  Skin: Negative.   Allergic/Immunologic: Negative.   Neurological: Positive for weakness.  Hematological: Negative.   Psychiatric/Behavioral: Negative.   All other systems reviewed and are negative.      Objective:   Physical Exam Vitals signs and nursing note reviewed.  Constitutional:      Appearance: Normal appearance.  Neck:     Musculoskeletal: Normal range of motion and neck supple.  Cardiovascular:     Rate and Rhythm: Normal rate and regular rhythm.     Pulses: Normal pulses.     Heart sounds: Normal heart sounds.  Pulmonary:     Effort: Pulmonary effort is normal.     Breath sounds: Normal breath sounds.  Musculoskeletal:     Comments: Normal Muscle Bulk and Muscle Testing Reveals:  Upper Extremities:Right: Full  ROM and Muscle Strength  5/5 Left: Decreased ROM 90 Degrees and Muscle Strength 5/5 Arises from Table slowly using walker for Support Wide Based Gait   Skin:    General: Skin is warm.  Neurological:     Mental Status: She is alert and oriented to person, place, and time.  Psychiatric:        Mood and Affect: Mood normal.        Behavior: Behavior normal.           Assessment & Plan:  1. Acute Ischemic Right MCA Stroke: Continue Out patient Therapy. Has a scheduled appointment with Neurology.  2. Acute DVT Left Tibial Vein/ Acute DVT of Popliteal Veins: Continue Eliquis.  3. Prediabetes: PCP Following.   20 minutes of face to face patient care time was spent during this visit. All questions were encouraged and answered.  F/U with Dr. Letta Pate in 4- 6 weeks

## 2019-01-21 DIAGNOSIS — I69328 Other speech and language deficits following cerebral infarction: Secondary | ICD-10-CM | POA: Diagnosis not present

## 2019-01-21 DIAGNOSIS — I69354 Hemiplegia and hemiparesis following cerebral infarction affecting left non-dominant side: Secondary | ICD-10-CM | POA: Diagnosis not present

## 2019-01-21 DIAGNOSIS — I10 Essential (primary) hypertension: Secondary | ICD-10-CM | POA: Diagnosis not present

## 2019-01-21 DIAGNOSIS — J45909 Unspecified asthma, uncomplicated: Secondary | ICD-10-CM | POA: Diagnosis not present

## 2019-01-21 DIAGNOSIS — M170 Bilateral primary osteoarthritis of knee: Secondary | ICD-10-CM | POA: Diagnosis not present

## 2019-01-23 DIAGNOSIS — I10 Essential (primary) hypertension: Secondary | ICD-10-CM | POA: Diagnosis not present

## 2019-01-23 DIAGNOSIS — I69354 Hemiplegia and hemiparesis following cerebral infarction affecting left non-dominant side: Secondary | ICD-10-CM | POA: Diagnosis not present

## 2019-01-23 DIAGNOSIS — M170 Bilateral primary osteoarthritis of knee: Secondary | ICD-10-CM | POA: Diagnosis not present

## 2019-01-23 DIAGNOSIS — J45909 Unspecified asthma, uncomplicated: Secondary | ICD-10-CM | POA: Diagnosis not present

## 2019-01-23 DIAGNOSIS — I69328 Other speech and language deficits following cerebral infarction: Secondary | ICD-10-CM | POA: Diagnosis not present

## 2019-01-25 DIAGNOSIS — R0982 Postnasal drip: Secondary | ICD-10-CM | POA: Diagnosis not present

## 2019-01-26 DIAGNOSIS — I69328 Other speech and language deficits following cerebral infarction: Secondary | ICD-10-CM | POA: Diagnosis not present

## 2019-01-26 DIAGNOSIS — R339 Retention of urine, unspecified: Secondary | ICD-10-CM | POA: Diagnosis not present

## 2019-01-26 DIAGNOSIS — E785 Hyperlipidemia, unspecified: Secondary | ICD-10-CM | POA: Diagnosis not present

## 2019-01-26 DIAGNOSIS — M170 Bilateral primary osteoarthritis of knee: Secondary | ICD-10-CM | POA: Diagnosis not present

## 2019-01-26 DIAGNOSIS — Z9049 Acquired absence of other specified parts of digestive tract: Secondary | ICD-10-CM | POA: Diagnosis not present

## 2019-01-26 DIAGNOSIS — I700 Atherosclerosis of aorta: Secondary | ICD-10-CM | POA: Diagnosis not present

## 2019-01-26 DIAGNOSIS — G8929 Other chronic pain: Secondary | ICD-10-CM | POA: Diagnosis not present

## 2019-01-26 DIAGNOSIS — F419 Anxiety disorder, unspecified: Secondary | ICD-10-CM | POA: Diagnosis not present

## 2019-01-26 DIAGNOSIS — Z8781 Personal history of (healed) traumatic fracture: Secondary | ICD-10-CM | POA: Diagnosis not present

## 2019-01-26 DIAGNOSIS — K219 Gastro-esophageal reflux disease without esophagitis: Secondary | ICD-10-CM | POA: Diagnosis not present

## 2019-01-26 DIAGNOSIS — Z7982 Long term (current) use of aspirin: Secondary | ICD-10-CM | POA: Diagnosis not present

## 2019-01-26 DIAGNOSIS — M549 Dorsalgia, unspecified: Secondary | ICD-10-CM | POA: Diagnosis not present

## 2019-01-26 DIAGNOSIS — I10 Essential (primary) hypertension: Secondary | ICD-10-CM | POA: Diagnosis not present

## 2019-01-26 DIAGNOSIS — H6982 Other specified disorders of Eustachian tube, left ear: Secondary | ICD-10-CM | POA: Diagnosis not present

## 2019-01-26 DIAGNOSIS — Z9851 Tubal ligation status: Secondary | ICD-10-CM | POA: Diagnosis not present

## 2019-01-26 DIAGNOSIS — Z6841 Body Mass Index (BMI) 40.0 and over, adult: Secondary | ICD-10-CM | POA: Diagnosis not present

## 2019-01-26 DIAGNOSIS — J45909 Unspecified asthma, uncomplicated: Secondary | ICD-10-CM | POA: Diagnosis not present

## 2019-01-26 DIAGNOSIS — R7303 Prediabetes: Secondary | ICD-10-CM | POA: Diagnosis not present

## 2019-01-26 DIAGNOSIS — D72829 Elevated white blood cell count, unspecified: Secondary | ICD-10-CM | POA: Diagnosis not present

## 2019-01-26 DIAGNOSIS — Z1159 Encounter for screening for other viral diseases: Secondary | ICD-10-CM | POA: Diagnosis not present

## 2019-01-26 DIAGNOSIS — Z9181 History of falling: Secondary | ICD-10-CM | POA: Diagnosis not present

## 2019-01-26 DIAGNOSIS — Z7901 Long term (current) use of anticoagulants: Secondary | ICD-10-CM | POA: Diagnosis not present

## 2019-01-26 DIAGNOSIS — H538 Other visual disturbances: Secondary | ICD-10-CM | POA: Diagnosis not present

## 2019-01-26 DIAGNOSIS — I69354 Hemiplegia and hemiparesis following cerebral infarction affecting left non-dominant side: Secondary | ICD-10-CM | POA: Diagnosis not present

## 2019-01-27 DIAGNOSIS — I69354 Hemiplegia and hemiparesis following cerebral infarction affecting left non-dominant side: Secondary | ICD-10-CM | POA: Diagnosis not present

## 2019-01-27 DIAGNOSIS — I10 Essential (primary) hypertension: Secondary | ICD-10-CM | POA: Diagnosis not present

## 2019-01-27 DIAGNOSIS — J45909 Unspecified asthma, uncomplicated: Secondary | ICD-10-CM | POA: Diagnosis not present

## 2019-01-27 DIAGNOSIS — M170 Bilateral primary osteoarthritis of knee: Secondary | ICD-10-CM | POA: Diagnosis not present

## 2019-01-27 DIAGNOSIS — I69328 Other speech and language deficits following cerebral infarction: Secondary | ICD-10-CM | POA: Diagnosis not present

## 2019-01-28 DIAGNOSIS — I10 Essential (primary) hypertension: Secondary | ICD-10-CM | POA: Diagnosis not present

## 2019-01-28 DIAGNOSIS — I69328 Other speech and language deficits following cerebral infarction: Secondary | ICD-10-CM | POA: Diagnosis not present

## 2019-01-28 DIAGNOSIS — M170 Bilateral primary osteoarthritis of knee: Secondary | ICD-10-CM | POA: Diagnosis not present

## 2019-01-28 DIAGNOSIS — R6889 Other general symptoms and signs: Secondary | ICD-10-CM | POA: Diagnosis not present

## 2019-01-28 DIAGNOSIS — I69354 Hemiplegia and hemiparesis following cerebral infarction affecting left non-dominant side: Secondary | ICD-10-CM | POA: Diagnosis not present

## 2019-01-28 DIAGNOSIS — J45909 Unspecified asthma, uncomplicated: Secondary | ICD-10-CM | POA: Diagnosis not present

## 2019-01-28 DIAGNOSIS — Z23 Encounter for immunization: Secondary | ICD-10-CM | POA: Diagnosis not present

## 2019-01-30 DIAGNOSIS — I69328 Other speech and language deficits following cerebral infarction: Secondary | ICD-10-CM | POA: Diagnosis not present

## 2019-01-30 DIAGNOSIS — J45909 Unspecified asthma, uncomplicated: Secondary | ICD-10-CM | POA: Diagnosis not present

## 2019-01-30 DIAGNOSIS — I10 Essential (primary) hypertension: Secondary | ICD-10-CM | POA: Diagnosis not present

## 2019-01-30 DIAGNOSIS — M170 Bilateral primary osteoarthritis of knee: Secondary | ICD-10-CM | POA: Diagnosis not present

## 2019-01-30 DIAGNOSIS — I69354 Hemiplegia and hemiparesis following cerebral infarction affecting left non-dominant side: Secondary | ICD-10-CM | POA: Diagnosis not present

## 2019-02-02 DIAGNOSIS — I10 Essential (primary) hypertension: Secondary | ICD-10-CM | POA: Diagnosis not present

## 2019-02-02 DIAGNOSIS — I69354 Hemiplegia and hemiparesis following cerebral infarction affecting left non-dominant side: Secondary | ICD-10-CM | POA: Diagnosis not present

## 2019-02-02 DIAGNOSIS — M170 Bilateral primary osteoarthritis of knee: Secondary | ICD-10-CM | POA: Diagnosis not present

## 2019-02-02 DIAGNOSIS — J45909 Unspecified asthma, uncomplicated: Secondary | ICD-10-CM | POA: Diagnosis not present

## 2019-02-02 DIAGNOSIS — I69328 Other speech and language deficits following cerebral infarction: Secondary | ICD-10-CM | POA: Diagnosis not present

## 2019-02-04 ENCOUNTER — Other Ambulatory Visit: Payer: Self-pay

## 2019-02-04 ENCOUNTER — Encounter: Payer: Self-pay | Admitting: Adult Health

## 2019-02-04 ENCOUNTER — Ambulatory Visit (INDEPENDENT_AMBULATORY_CARE_PROVIDER_SITE_OTHER): Payer: Medicare Other | Admitting: Adult Health

## 2019-02-04 VITALS — BP 146/82 | HR 81 | Temp 97.5°F | Ht 61.0 in | Wt 232.0 lb

## 2019-02-04 DIAGNOSIS — I82442 Acute embolism and thrombosis of left tibial vein: Secondary | ICD-10-CM

## 2019-02-04 DIAGNOSIS — I10 Essential (primary) hypertension: Secondary | ICD-10-CM

## 2019-02-04 DIAGNOSIS — J45909 Unspecified asthma, uncomplicated: Secondary | ICD-10-CM | POA: Diagnosis not present

## 2019-02-04 DIAGNOSIS — I639 Cerebral infarction, unspecified: Secondary | ICD-10-CM | POA: Diagnosis not present

## 2019-02-04 DIAGNOSIS — R7303 Prediabetes: Secondary | ICD-10-CM

## 2019-02-04 DIAGNOSIS — I69328 Other speech and language deficits following cerebral infarction: Secondary | ICD-10-CM | POA: Diagnosis not present

## 2019-02-04 DIAGNOSIS — G8194 Hemiplegia, unspecified affecting left nondominant side: Secondary | ICD-10-CM

## 2019-02-04 DIAGNOSIS — E785 Hyperlipidemia, unspecified: Secondary | ICD-10-CM | POA: Diagnosis not present

## 2019-02-04 DIAGNOSIS — I69354 Hemiplegia and hemiparesis following cerebral infarction affecting left non-dominant side: Secondary | ICD-10-CM | POA: Diagnosis not present

## 2019-02-04 DIAGNOSIS — M170 Bilateral primary osteoarthritis of knee: Secondary | ICD-10-CM | POA: Diagnosis not present

## 2019-02-04 NOTE — Progress Notes (Signed)
[facility] [address] [place]. [place] [address] [phone]       HOSPITAL FOLLOW UP NOTE  [name] Date of Birth:  1951-03-08 Medical Record Number:  [id number]   Reason for Referral:  hospital stroke follow up    CHIEF COMPLAINT:  Chief Complaint  Patient presents with  . Hospitalization Follow-up    Daughter present. Rm 9. No new concerns at this time.     HPI: [name] being seen today for in office hospital follow-up regarding cryptogenic right MCA stroke on 11/29/2018.  History obtained from patient, daughter and chart review. Reviewed all radiology images and labs personally.  [name] a 68 y.o.femalewith a history of pre diabetes and asthma but on no medications, presented to [facility] ED on 11/29/2018 withleft-sided tingling and right gaze preference.  Stroke work-up revealed large right MCA M2 punctate left frontal temporal infarcts as evidenced on MRI status post TPA and IR with right MCA TICI 2B revascularization embolic pattern secondary to unknown etiology.  Post TPA imaging showed small hemorrhage stable.  TEE unremarkable therefore loop recorder placed for long-term monitoring of atrial fibrillation and stroke etiology.  Initiated aspirin 81 mg daily.  No DAPT due to post TPA hemorrhage.  HTN stable long-term BP goal normotensive range.  LDL 103 initiated atorvastatin 40 mg daily.  Pre-DM with A1c 5.9.  Other stroke risk factors include advanced age and morbid obesity but no prior history of stroke.  Residual deficits from right gaze preference, left homonymous hemianopia, mild dysarthria, mild left facial weakness and left hemiparesis.  Discharged to CIR for ongoing therapies.   CT head - Acute posterior right MCA infarct with hyperdense right M2 branches.  CTA H&N -Proximal right M2 occlusion.  CT Penumbra - Associated acute right MCA infarct with penumbra.   Cerebral angio TICI2b+ revascularization of occluded Rt MCA inf  division with x 1 pass embotrap and penumbra.  Post IR CT no gross hmg, contrast blush present R post frontal parietal region  MRI head -mod R MCA infarct w/ confluent petechial hmg. 2 small L punctate Frontotemporal infarcts   Repeat stat CT 7/20 stable, no significant change  2D Echo EF > 65%. No source of embolus  TEEno PFO, no SOE  Loopplaced 12/02/2018  [name] Virus 2 - negative  LDL- 103  HgbA1c- 5.9  No antithromboticprior to admission, now on aspirin 81 mg dailypost tPA  Therapy recommendations:CIR  Disposition: CIR  During CIR admission, repeat LE venous Doppler showed progression of acute DVT from left PTV to left popliteal vein therefore recommended anticoagulation with Eliquis 5 mg twice daily as repeat CT head stable without new bleeding or hematoma.  She was discharged home with recommendation of home health therapy on 12/26/2018.   [name] is being seen today, 02/04/2019, for hospital follow-up accompanied by her daughter.  Residual deficits left hemiparesis with improvement.  Denies residual vision or speech difficulties.  She will be completing OT shortly and continues to participate in home health PT.  Currently ambulates with rolling walker but in the process of transitioning to cane inside the house with assistance of PT.  Currently living with her husband but does have assistance of her daughters when needed.  She is becoming more independent with ADLs.  Currently on aspirin 81 mg daily and Eliquis 5 mg twice daily without bleeding or bruising.  Continues on atorvastatin 40 mg daily without myalgias.  Blood pressure today 146/82 but does monitor at home and typically  lower.  Recently started on sertraline by PCP for increased anxiety.  Loop recorder is not shown atrial fibrillation thus far.  Denies new or worsening stroke/TIA symptoms.      ROS:   14 system review of systems performed and negative with exception of weakness, swelling in legs  and anxiety  PMH:  Past Medical History:  Diagnosis Date  . Arthritis    osteoarthritis in  both knees  . Asthma   . Difficult intravenous access   . GERD (gastroesophageal reflux disease)   . Heart murmur    born with years ago  . Pre-diabetes    last A1C=6.0 per pt  . Torn meniscus    left knee w fracture x2 to left knee  . Wears glasses     PSH:  Past Surgical History:  Procedure Laterality Date  . BUBBLE STUDY  12/02/2018   Procedure: BUBBLE STUDY;  Surgeon: Elouise Munroe, MD;  Location: [place];  Service: Cardiology;;  . CHOLECYSTECTOMY    . CHONDROPLASTY Right 07/09/2017   Procedure: CHONDROPLASTY;  Surgeon: Sydnee Cabal, MD;  Location: [facility];  Service: Orthopedics;  Laterality: Right;  . HERNIA REPAIR Right age 68   inguinal  . IR CT HEAD LTD  11/29/2018  . IR PERCUTANEOUS ART THROMBECTOMY/INFUSION INTRACRANIAL INC DIAG ANGIO  11/29/2018  . KNEE ARTHROSCOPY WITH MEDIAL MENISECTOMY Left 01/10/2017   Procedure: LEFT KNEE ARTHROSCOPY, DEBRIDEMENT, PARTIAL MEDIAL MENISCECTOMY, CHONDROPLASTY, ARTHROSCOPIC ASSISTED INTERNAL FIXATION OF MEDIAL FEMORAL CONDYLE AND MEDIAL TIBIAL PLATEAU INSUFFICIENCY FRACTURES;  Surgeon: Sydnee Cabal, MD;  Location: [place];  Service: Orthopedics;  Laterality: Left;  . KNEE ARTHROSCOPY WITH MEDIAL MENISECTOMY Right 07/09/2017   Procedure: Right knee scope, debridement, chondroplasty, partial meniscectomy, internal arthroscopic assisted internal fixation of medial tibial plateau fracture;  Surgeon: Sydnee Cabal, MD;  Location: [facility];  Service: Orthopedics;  Laterality: Right;  . LOOP RECORDER INSERTION N/A 12/02/2018   Procedure: LOOP RECORDER INSERTION;  Surgeon: Evans Lance, MD;  Location: [facility];  Service: Cardiovascular;  Laterality: N/A;  . RADIOLOGY WITH ANESTHESIA N/A 11/29/2018   Procedure: RADIOLOGY WITH ANESTHESIA;  Surgeon: Luanne Bras, MD;   Location: [place];  Service: Radiology;  Laterality: N/A;  . TEE WITHOUT CARDIOVERSION N/A 12/02/2018   Procedure: TRANSESOPHAGEAL ECHOCARDIOGRAM (TEE);  Surgeon: Elouise Munroe, MD;  Location: [place];  Service: Cardiology;  Laterality: N/A;  . TUBAL LIGATION  age 59    Social History:  Social History   Socioeconomic History  . Marital status: Married    Spouse name: Not on file  . Number of children: Not on file  . Years of education: Not on file  . Highest education level: Not on file  Occupational History  . Not on file  Social Needs  . Financial resource strain: Not on file  . Food insecurity    Worry: Not on file    Inability: Not on file  . Transportation needs    Medical: Not on file    Non-medical: Not on file  Tobacco Use  . Smoking status: Never Smoker  . Smokeless tobacco: Never Used  Substance and Sexual Activity  . Alcohol use: No  . Drug use: No  . Sexual activity: Not on file  Lifestyle  . Physical activity    Days per week: Not on file    Minutes per session: Not on file  . Stress: Not on file  Relationships  . Social connections  Talks on phone: Not on file    Gets together: Not on file    Attends religious service: Not on file    Active member of club or organization: Not on file    Attends meetings of clubs or organizations: Not on file    Relationship status: Not on file  . Intimate partner violence    Fear of current or ex partner: Not on file    Emotionally abused: Not on file    Physically abused: Not on file    Forced sexual activity: Not on file  Other Topics Concern  . Not on file  Social History Narrative  . Not on file    Family History:  Family History  Problem Relation Age of Onset  . Stroke Father   . Stroke Brother     Medications:   Current Outpatient Medications on File Prior to Visit  Medication Sig Dispense Refill  . acetaminophen (TYLENOL) 500 MG tablet Take 1,000 mg by mouth as needed (Knee pain).    Marland Kitchen  apixaban (ELIQUIS) 5 MG TABS tablet Take 1 tablet (5 mg total) by mouth 2 (two) times daily. 60 tablet 0  . atorvastatin (LIPITOR) 40 MG tablet Take 1 tablet (40 mg total) by mouth daily at 6 PM. 30 tablet 1  . Calcium Carbonate (CALCIUM 600 PO) Take 1 tablet by mouth daily. 1    . diclofenac sodium (VOLTAREN) 1 % GEL Apply 2 g topically 4 (four) times daily. To left knee 350 g 1  . famotidine (PEPCID) 10 MG tablet Take 10 mg by mouth daily.    . Multiple Vitamin (MULTIVITAMIN) tablet Take 1 tablet by mouth daily.    . polyethylene glycol (MIRALAX / GLYCOLAX) 17 g packet Take 17 g by mouth daily. (Patient taking differently: Take 17 g by mouth daily as needed. ) 30 each 0  . senna-docusate (SENOKOT-S) 8.6-50 MG tablet Take 2 tablets by mouth at bedtime. 60 tablet 0  . sertraline (ZOLOFT) 25 MG tablet Take 25 mg by mouth daily.    . hydrOXYzine (VISTARIL) 25 MG capsule Take 25 mg by mouth as needed.     No current facility-administered medications on file prior to visit.     Allergies:  No Known Allergies   Physical Exam  Vitals:   02/04/19 1120 02/04/19 1204  BP: (!) 149/80 (!) 146/82  Pulse: 81   Temp: (!) 97.5 F (36.4 C)   TempSrc: Oral   Weight: 232 lb (105.2 kg)   Height: 5\' 1"  (1.549 m)    Body mass index is 43.84 kg/m. No exam data present  Depression screen [facility] 2/9 02/04/2019  Decreased Interest 0  Down, Depressed, Hopeless 0  PHQ - 2 Score 0     General: well developed, well nourished,  pleasant middle-age Caucasian female, seated, in no evident distress Head: head normocephalic and atraumatic.   Neck: supple with no carotid or supraclavicular bruits Cardiovascular: regular rate and rhythm, no murmurs; +2 pitting edema bilaterally Musculoskeletal: no deformity Skin:  no rash/petichiae Vascular:  Normal pulses all extremities   Neurologic Exam Mental Status: Awake and fully alert. Oriented to place and time. Recent and remote memory intact. Attention span,  concentration and fund of knowledge appropriate. Mood and affect appropriate.  Cranial Nerves: Fundoscopic exam reveals sharp disc margins. Pupils equal, briskly reactive to light. Extraocular movements full without nystagmus. Visual fields full to confrontation.  Right gaze preference but is able to cross midline without difficulty.  Hearing intact.  Facial sensation intact.  Mild left lower facial weakness Motor: Normal bulk and tone. Normal strength in all tested extremity muscles. LUE: 4/5 with weak grip strength LLE: 4/5 ankle dorsiflexion  Full-strength right upper and lower extremity Sensory.: intact to touch , pinprick , position and vibratory sensation.  Coordination: Rapid alternating movements normal in all extremities except decreased left hand dexterity. Finger-to-nose and heel-to-shin performed accurately on right side. Gait and Station: Arises from chair without difficulty. Stance is normal. Gait demonstrates  hemiplegic gait with assistance of walker Reflexes: 1+ and symmetric. Toes downgoing.     NIHSS  4 Modified Rankin  3    Diagnostic Data (Labs, Imaging, Testing)  CT HEAD WO CONTRAST 11/29/2018 1. Acute posterior right MCA infarct with hyperdense right M2 branches. No hemorrhage.  2. ASPECTS is 6.   Ct Angio Head W Or Wo Contrast Ct Angio Neck W Or Wo Contrast Ct Cerebral Perfusion W Contrast 11/29/2018 1. Proximal right M2 occlusion. 2. Associated acute right MCA infarct with penumbra on CTP as above.  3. Moderate cervical carotid artery atherosclerosis without significant stenosis.   Interventional Radiology S/P RT common carotid arterriogram followed Byrevascularization of occluded Rt MCA inf division with x 1 pass with 71mm x 74mm embotrap retriever device and penumbra aspiration achieving a TICI 2b+(TICI 2c ) revascularization. S.Deveshwar MD  Mr Brain Wo Contrast 11/30/2018 1. Moderate-sized acute right MCA infarct with confluent petechial  hemorrhage. 2. One or two punctate acute left frontoparietal infarcts. 3. Mild chronic small vessel ischemic disease.  Ct Head Wo Contrast 12/01/2018 1. Redemonstrated large edematous infarction of the posterior right MCA territory, edema and hypodensity substantially increased compared to prior examination dated 11/29/2018. 2. There is new hemorrhagic conversion anteriorly, noted in the deep white matter of the posterior frontal and temporal lobes about the insula (series 7, image 15). 3. No significant midline shift or evidence of downward herniation.   Portable CXR 11/30/18 Minimal bibasilar subsegmental atelectasis.  Transthoracic Echocardiogram  11/30/2018 1. The left ventricle has hyperdynamic systolic function, with an ejection fraction of >65%. The cavity size was normal. Left ventricular diastolic Doppler parameters are consistent with impaired relaxation. 2. The right ventricle has normal systolic function. The cavity was normal. 3. The mitral valve is abnormal. There is moderate mitral annular calcification present. 4. The tricuspid valve is grossly normal. 5. The aortic valve is tricuspid. No stenosis of the aortic valve. 6. The aortic root is normal in size and structure. 7. Vigorous LV systolic function; mild diastolic dysfunction; trace TR.  EKG - SR rate 96 BPM. (See cardiology reading for complete details)  TEE No PFO with rest or valsalva No LAA thrombus No left sided valvular lesions Aortic atherosclerosis present in aortic arch     ASSESSMENT: Deanna [name] is a 68 y.o. year old female presented with left-sided tingling and right gaze preference on 11/29/2018 with stroke work-up revealing large right MCA and 2 punctate left frontotemporal infarcts s/p TPA and IR with right MCA revascularization secondary to unknown etiology.  Loop recorder placed which has not shown atrial fibrillation thus far.  Evidence of left lower extremity DVT with progression from  left PTV to left popliteal vein therefore Eliquis initiated.  Vascular risk factors include HTN, HLD, pre-DM and morbid obesity.  Recovering well with residual deficits of mild left hemiparesis resolution of visual loss and speech difficulties.    PLAN:  1. Cryptogenic stroke: Continue Eliquis (apixaban) daily  and Lipitor for secondary stroke prevention.  No indication  for aspirin 81 mg in addition to Eliquis therefore recommend discontinuing aspirin and continue on Eliquis alone.  Maintain strict control of hypertension with blood pressure goal below 130/90, diabetes with hemoglobin A1c goal below 6.5% and cholesterol with LDL cholesterol (bad cholesterol) goal below 70 mg/dL.  I also advised the patient to eat a healthy diet with plenty of whole grains, cereals, fruits and vegetables, exercise regularly with at least 30 minutes of continuous activity daily and maintain ideal body weight.  Continue to monitor loop recorder for atrial fibrillation 2. Acute DVT: Repeat lower extremity ultrasound in 2 months and if DVT resolved, discontinue Eliquis and restart aspirin. 3. HTN: Advised to continue current treatment regimen.  Today's BP stable.  Advised to continue to monitor at home along with continued follow-up with PCP for management 4. HLD: Advised to continue current treatment regimen along with continued follow-up with PCP for future prescribing and monitoring of lipid panel 5. Pre- DMII: Continue to follow with PCP for ongoing monitoring 6. Left hemiparesis: Continuation of home health PT with potential need of transitioning to outpatient therapy once completed    Follow up in 3 months or call earlier if needed   Greater than 50% of time during this 45 minute visit was spent on counseling, explanation of diagnosis of cryptogenic stroke, reviewing risk factor management of acute DVT, HTN, HLD and pre-DM, planning of further management along with potential future management, and discussion with  patient and family answering all questions.    Frann Rider, AGNP-BC  [facility] [address] [place], [place] [address]  Phone [phone] Fax [phone] Note: This document was prepared with digital dictation and possible smart phrase technology. Any transcriptional errors that result from this process are unintentional.

## 2019-02-04 NOTE — Patient Instructions (Addendum)
Continue Eliquis (apixaban) daily  and lipitor  for secondary stroke prevention  Stop aspirin   Continue to follow up with PCP regarding cholesterol and blood pressure management   We will repeat lower extremity ultrasound in 2 months to assess for resolution of clot - if resolved, we can discontinue Eliquis and continue aspirin alone  Continue home therapy and may need to transition to outpatient therapy once able   Continue to monitor blood pressure at home  Maintain strict control of hypertension with blood pressure goal below 130/90, diabetes with hemoglobin A1c goal below 6.5% and cholesterol with LDL cholesterol (bad cholesterol) goal below 70 mg/dL. I also advised the patient to eat a healthy diet with plenty of whole grains, cereals, fruits and vegetables, exercise regularly and maintain ideal body weight.  Followup in the future with me in 3 months or call earlier if needed       Thank you for coming to see [place] at [facility]. I hope we have been able to provide you high quality care today.  You may receive a patient satisfaction survey over the next few weeks. We would appreciate your feedback and comments so that we may continue to improve ourselves and the health of our patients.

## 2019-02-04 NOTE — Progress Notes (Signed)
I agree with the above plan 

## 2019-02-05 DIAGNOSIS — M170 Bilateral primary osteoarthritis of knee: Secondary | ICD-10-CM | POA: Diagnosis not present

## 2019-02-05 DIAGNOSIS — I69328 Other speech and language deficits following cerebral infarction: Secondary | ICD-10-CM | POA: Diagnosis not present

## 2019-02-05 DIAGNOSIS — J45909 Unspecified asthma, uncomplicated: Secondary | ICD-10-CM | POA: Diagnosis not present

## 2019-02-05 DIAGNOSIS — I10 Essential (primary) hypertension: Secondary | ICD-10-CM | POA: Diagnosis not present

## 2019-02-05 DIAGNOSIS — I69354 Hemiplegia and hemiparesis following cerebral infarction affecting left non-dominant side: Secondary | ICD-10-CM | POA: Diagnosis not present

## 2019-02-06 ENCOUNTER — Ambulatory Visit (INDEPENDENT_AMBULATORY_CARE_PROVIDER_SITE_OTHER): Payer: Medicare Other | Admitting: *Deleted

## 2019-02-06 DIAGNOSIS — I10 Essential (primary) hypertension: Secondary | ICD-10-CM | POA: Diagnosis not present

## 2019-02-06 DIAGNOSIS — I69354 Hemiplegia and hemiparesis following cerebral infarction affecting left non-dominant side: Secondary | ICD-10-CM | POA: Diagnosis not present

## 2019-02-06 DIAGNOSIS — I639 Cerebral infarction, unspecified: Secondary | ICD-10-CM

## 2019-02-06 DIAGNOSIS — J45909 Unspecified asthma, uncomplicated: Secondary | ICD-10-CM | POA: Diagnosis not present

## 2019-02-06 DIAGNOSIS — M170 Bilateral primary osteoarthritis of knee: Secondary | ICD-10-CM | POA: Diagnosis not present

## 2019-02-06 DIAGNOSIS — I69328 Other speech and language deficits following cerebral infarction: Secondary | ICD-10-CM | POA: Diagnosis not present

## 2019-02-09 DIAGNOSIS — M170 Bilateral primary osteoarthritis of knee: Secondary | ICD-10-CM | POA: Diagnosis not present

## 2019-02-09 DIAGNOSIS — J45909 Unspecified asthma, uncomplicated: Secondary | ICD-10-CM | POA: Diagnosis not present

## 2019-02-09 DIAGNOSIS — I10 Essential (primary) hypertension: Secondary | ICD-10-CM | POA: Diagnosis not present

## 2019-02-09 DIAGNOSIS — I69354 Hemiplegia and hemiparesis following cerebral infarction affecting left non-dominant side: Secondary | ICD-10-CM | POA: Diagnosis not present

## 2019-02-09 DIAGNOSIS — I69328 Other speech and language deficits following cerebral infarction: Secondary | ICD-10-CM | POA: Diagnosis not present

## 2019-02-10 LAB — CUP PACEART REMOTE DEVICE CHECK
Date Time Interrogation Session: 20200928172857
Implantable Pulse Generator Implant Date: 20200721

## 2019-02-10 NOTE — Progress Notes (Signed)
[facility] Summary Report / Loop Recorder 

## 2019-02-11 DIAGNOSIS — I69354 Hemiplegia and hemiparesis following cerebral infarction affecting left non-dominant side: Secondary | ICD-10-CM | POA: Diagnosis not present

## 2019-02-11 DIAGNOSIS — I10 Essential (primary) hypertension: Secondary | ICD-10-CM | POA: Diagnosis not present

## 2019-02-11 DIAGNOSIS — I69328 Other speech and language deficits following cerebral infarction: Secondary | ICD-10-CM | POA: Diagnosis not present

## 2019-02-11 DIAGNOSIS — J45909 Unspecified asthma, uncomplicated: Secondary | ICD-10-CM | POA: Diagnosis not present

## 2019-02-11 DIAGNOSIS — M170 Bilateral primary osteoarthritis of knee: Secondary | ICD-10-CM | POA: Diagnosis not present

## 2019-02-12 ENCOUNTER — Encounter: Payer: Medicare Other | Attending: Physical Medicine & Rehabilitation | Admitting: Physical Medicine & Rehabilitation

## 2019-02-12 ENCOUNTER — Other Ambulatory Visit: Payer: Self-pay

## 2019-02-12 ENCOUNTER — Encounter: Payer: Self-pay | Admitting: Physical Medicine & Rehabilitation

## 2019-02-12 VITALS — BP 144/68 | HR 79 | Temp 97.5°F | Ht 61.0 in | Wt 233.0 lb

## 2019-02-12 DIAGNOSIS — I69959 Hemiplegia and hemiparesis following unspecified cerebrovascular disease affecting unspecified side: Secondary | ICD-10-CM | POA: Diagnosis not present

## 2019-02-12 DIAGNOSIS — I639 Cerebral infarction, unspecified: Secondary | ICD-10-CM

## 2019-02-12 DIAGNOSIS — R7303 Prediabetes: Secondary | ICD-10-CM | POA: Diagnosis not present

## 2019-02-12 DIAGNOSIS — I69328 Other speech and language deficits following cerebral infarction: Secondary | ICD-10-CM | POA: Diagnosis not present

## 2019-02-12 DIAGNOSIS — I82433 Acute embolism and thrombosis of popliteal vein, bilateral: Secondary | ICD-10-CM | POA: Diagnosis not present

## 2019-02-12 DIAGNOSIS — I10 Essential (primary) hypertension: Secondary | ICD-10-CM | POA: Diagnosis not present

## 2019-02-12 DIAGNOSIS — M170 Bilateral primary osteoarthritis of knee: Secondary | ICD-10-CM | POA: Diagnosis not present

## 2019-02-12 DIAGNOSIS — I82442 Acute embolism and thrombosis of left tibial vein: Secondary | ICD-10-CM | POA: Diagnosis not present

## 2019-02-12 DIAGNOSIS — I63511 Cerebral infarction due to unspecified occlusion or stenosis of right middle cerebral artery: Secondary | ICD-10-CM | POA: Insufficient documentation

## 2019-02-12 DIAGNOSIS — I69354 Hemiplegia and hemiparesis following cerebral infarction affecting left non-dominant side: Secondary | ICD-10-CM | POA: Diagnosis not present

## 2019-02-12 DIAGNOSIS — J45909 Unspecified asthma, uncomplicated: Secondary | ICD-10-CM | POA: Diagnosis not present

## 2019-02-12 NOTE — Progress Notes (Signed)
Subjective:  PCP  Dr Lindell Noe  Patient ID: Deanna [name], female    DOB: 07/06/1950, 68 y.o.   MRN: [id number] 68 year old female with history of prediabetes, OA bilateral knees, obesity who was admitted on 11/29/2018 with left-sided tingling and left lower extremity weakness with right gaze preference.  CTA head neck with perfusion showed proximal right M2 occlusion with associated right MCA infarct with penumbra and moderate cervical artery atherosclerosis.  She underwent cerebral Angie with endovascular revascularization of right MCA by Dr. Terrilee Files.  Follow-up MRI brain showed moderate acute right-MCA infarct with petechial hemorrhage in 1 of 2 acute left frontoparietal infarcts.    On 07/20, she had increasing left-sided weakness with nausea and follow-up CT head showed large edematous infarct in right MCA with substantial increase in hypodensity and new hemorrhagic conversion in deep white matter of posterior frontal and temporal lobes.  She was treated with fluid boluses with recommendations to keep SBP goal less than 160. HPI  HHPT and OT coming to an end Pt is dressing mod I Needs supervision for shower transfers No falls  Pain Inventory Average Pain 0 Pain Right Now 0 My pain is na  In the last 24 hours, has pain interfered with the following? General activity 0 Relation with others 0 Enjoyment of life 0 What TIME of day is your pain at its worst? na Sleep (in general) Good  Pain is worse with: na Pain improves with: na Relief from Meds: na  Mobility use a cane use a walker ability to climb steps?  yes do you drive?  no  Function retired  Neuro/Psych anxiety  Prior Studies Any changes since last visit?  no  Physicians involved in your care Any changes since last visit?  no   Family History  Problem Relation Age of Onset   Stroke Father    Stroke Brother    Social History   Socioeconomic History   Marital status: Married    Spouse name: Not on file    Number of children: Not on file   Years of education: Not on file   Highest education level: Not on file  Occupational History   Not on file  Social Needs   Financial resource strain: Not on file   Food insecurity    Worry: Not on file    Inability: Not on file   Transportation needs    Medical: Not on file    Non-medical: Not on file  Tobacco Use   Smoking status: Never Smoker   Smokeless tobacco: Never Used  Substance and Sexual Activity   Alcohol use: No   Drug use: No   Sexual activity: Not on file  Lifestyle   Physical activity    Days per week: Not on file    Minutes per session: Not on file   Stress: Not on file  Relationships   Social connections    Talks on phone: Not on file    Gets together: Not on file    Attends religious service: Not on file    Active member of club or organization: Not on file    Attends meetings of clubs or organizations: Not on file    Relationship status: Not on file  Other Topics Concern   Not on file  Social History Narrative   Not on file   Past Surgical History:  Procedure Laterality Date   BUBBLE STUDY  12/02/2018   Procedure: BUBBLE STUDY;  Surgeon: Elouise Munroe, MD;  Location: [facility];  Service: Cardiology;;   CHOLECYSTECTOMY     CHONDROPLASTY Right 07/09/2017   Procedure: CHONDROPLASTY;  Surgeon: Sydnee Cabal, MD;  Location: [facility];  Service: Orthopedics;  Laterality: Right;   HERNIA REPAIR Right age 45   inguinal   IR CT HEAD LTD  11/29/2018   IR PERCUTANEOUS ART THROMBECTOMY/INFUSION INTRACRANIAL INC DIAG ANGIO  11/29/2018   KNEE ARTHROSCOPY WITH MEDIAL MENISECTOMY Left 01/10/2017   Procedure: LEFT KNEE ARTHROSCOPY, DEBRIDEMENT, PARTIAL MEDIAL MENISCECTOMY, CHONDROPLASTY, ARTHROSCOPIC ASSISTED INTERNAL FIXATION OF MEDIAL FEMORAL CONDYLE AND MEDIAL TIBIAL PLATEAU INSUFFICIENCY FRACTURES;  Surgeon: Sydnee Cabal, MD;  Location: [place];  Service:  Orthopedics;  Laterality: Left;   KNEE ARTHROSCOPY WITH MEDIAL MENISECTOMY Right 07/09/2017   Procedure: Right knee scope, debridement, chondroplasty, partial meniscectomy, internal arthroscopic assisted internal fixation of medial tibial plateau fracture;  Surgeon: Sydnee Cabal, MD;  Location: [facility];  Service: Orthopedics;  Laterality: Right;   LOOP RECORDER INSERTION N/A 12/02/2018   Procedure: LOOP RECORDER INSERTION;  Surgeon: Evans Lance, MD;  Location: [facility];  Service: Cardiovascular;  Laterality: N/A;   RADIOLOGY WITH ANESTHESIA N/A 11/29/2018   Procedure: RADIOLOGY WITH ANESTHESIA;  Surgeon: Luanne Bras, MD;  Location: [place];  Service: Radiology;  Laterality: N/A;   TEE WITHOUT CARDIOVERSION N/A 12/02/2018   Procedure: TRANSESOPHAGEAL ECHOCARDIOGRAM (TEE);  Surgeon: Elouise Munroe, MD;  Location: [place];  Service: Cardiology;  Laterality: N/A;   TUBAL LIGATION  age 67   Past Medical History:  Diagnosis Date   Arthritis    osteoarthritis in  both knees   Asthma    Difficult intravenous access    GERD (gastroesophageal reflux disease)    Heart murmur    born with years ago   Pre-diabetes    last A1C=6.0 per pt   Torn meniscus    left knee w fracture x2 to left knee   Wears glasses    BP (!) 144/68    Pulse 79    Temp (!) 97.5 F (36.4 C)    Ht 5\' 1"  (1.549 m)    Wt 233 lb (105.7 kg)    LMP  (LMP Unknown)    SpO2 97%    BMI 44.02 kg/m   Opioid Risk Score:   Fall Risk Score:  `1  Depression screen PHQ 2/9  Depression screen PHQ 2/9 02/04/2019  Decreased Interest 0  Down, Depressed, Hopeless 0  PHQ - 2 Score 0     Review of Systems  Constitutional: Negative.   HENT: Negative.   Eyes: Negative.   Respiratory: Negative.   Cardiovascular: Negative.   Gastrointestinal: Negative.   Endocrine: Negative.   Genitourinary: Negative.   Musculoskeletal: Positive for gait problem.  Skin: Negative.     Allergic/Immunologic: Negative.   Hematological: Negative.   Psychiatric/Behavioral: Positive for confusion. The patient is nervous/anxious.   All other systems reviewed and are negative.      Objective:   Physical Exam Constitutional:      Appearance: Normal appearance.  Eyes:     Extraocular Movements: Extraocular movements intact.     Conjunctiva/sclera: Conjunctivae normal.     Pupils: Pupils are equal, round, and reactive to light.  Cardiovascular:     Rate and Rhythm: Normal rate and regular rhythm.     Heart sounds: Normal heart sounds. No murmur.  Pulmonary:     Effort: Pulmonary effort is normal. No respiratory distress.     Breath sounds:  Normal breath sounds.  Abdominal:     General: Abdomen is flat. Bowel sounds are normal. There is no distension.     Palpations: Abdomen is soft.  Neurological:     Mental Status: She is alert.     Cranial Nerves: No dysarthria.     Sensory: No sensory deficit.     Comments: Mild left neglect on confrontation testing. Tactile inattention is well  Mild dysmetria left finger-nose-finger  Motor strength is 4- at the left deltoid bicep tricep grip as well as hip flexion knee extension 3- ankle dorsiflexion 5/5 on the right side.  Psychiatric:        Mood and Affect: Mood normal.        Thought Content: Thought content normal.        Judgment: Judgment normal.           Assessment & Plan:  1.  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  She may benefit from additional PT and OT.  She would like to go to a PT office close to home however he do not have OT services.  Will make referral to [facility] outpatient and they can direct her to the most appropriate office. I will see her back in approximately 6 weeks

## 2019-02-16 ENCOUNTER — Other Ambulatory Visit: Payer: Self-pay | Admitting: Physical Medicine and Rehabilitation

## 2019-02-18 DIAGNOSIS — I69354 Hemiplegia and hemiparesis following cerebral infarction affecting left non-dominant side: Secondary | ICD-10-CM | POA: Diagnosis not present

## 2019-02-18 DIAGNOSIS — M170 Bilateral primary osteoarthritis of knee: Secondary | ICD-10-CM | POA: Diagnosis not present

## 2019-02-18 DIAGNOSIS — I69328 Other speech and language deficits following cerebral infarction: Secondary | ICD-10-CM | POA: Diagnosis not present

## 2019-02-18 DIAGNOSIS — J45909 Unspecified asthma, uncomplicated: Secondary | ICD-10-CM | POA: Diagnosis not present

## 2019-02-18 DIAGNOSIS — I10 Essential (primary) hypertension: Secondary | ICD-10-CM | POA: Diagnosis not present

## 2019-02-23 ENCOUNTER — Other Ambulatory Visit: Payer: Self-pay | Admitting: Physical Medicine and Rehabilitation

## 2019-02-24 DIAGNOSIS — I69354 Hemiplegia and hemiparesis following cerebral infarction affecting left non-dominant side: Secondary | ICD-10-CM | POA: Diagnosis not present

## 2019-02-24 DIAGNOSIS — I10 Essential (primary) hypertension: Secondary | ICD-10-CM | POA: Diagnosis not present

## 2019-02-24 DIAGNOSIS — M170 Bilateral primary osteoarthritis of knee: Secondary | ICD-10-CM | POA: Diagnosis not present

## 2019-02-24 DIAGNOSIS — I69328 Other speech and language deficits following cerebral infarction: Secondary | ICD-10-CM | POA: Diagnosis not present

## 2019-02-24 DIAGNOSIS — J45909 Unspecified asthma, uncomplicated: Secondary | ICD-10-CM | POA: Diagnosis not present

## 2019-02-26 ENCOUNTER — Other Ambulatory Visit: Payer: Self-pay | Admitting: Physical Medicine and Rehabilitation

## 2019-02-26 ENCOUNTER — Other Ambulatory Visit: Payer: Self-pay

## 2019-02-26 NOTE — Patient Outreach (Signed)
Telephone outreach to patient to obtain mRs was successfully completed. mRs= 3. 

## 2019-03-04 ENCOUNTER — Encounter: Payer: Self-pay | Admitting: Physical Therapy

## 2019-03-04 ENCOUNTER — Other Ambulatory Visit: Payer: Self-pay

## 2019-03-04 ENCOUNTER — Ambulatory Visit: Payer: Medicare Other | Attending: Physical Medicine & Rehabilitation | Admitting: Physical Therapy

## 2019-03-04 DIAGNOSIS — I69315 Cognitive social or emotional deficit following cerebral infarction: Secondary | ICD-10-CM | POA: Diagnosis not present

## 2019-03-04 DIAGNOSIS — M6281 Muscle weakness (generalized): Secondary | ICD-10-CM | POA: Insufficient documentation

## 2019-03-04 DIAGNOSIS — R41842 Visuospatial deficit: Secondary | ICD-10-CM | POA: Insufficient documentation

## 2019-03-04 DIAGNOSIS — R2689 Other abnormalities of gait and mobility: Secondary | ICD-10-CM | POA: Diagnosis not present

## 2019-03-04 DIAGNOSIS — I69354 Hemiplegia and hemiparesis following cerebral infarction affecting left non-dominant side: Secondary | ICD-10-CM | POA: Diagnosis not present

## 2019-03-04 DIAGNOSIS — R278 Other lack of coordination: Secondary | ICD-10-CM | POA: Insufficient documentation

## 2019-03-04 NOTE — Patient Instructions (Signed)
Access Code: [email or web address]  URL: [email or web address]  Date: 03/04/2019  Prepared by: Elsie Ra   Exercises  Backward Walking with Counter Support - 3-5 reps - 1 sets - 2x daily - 6x weekly  Side Stepping with Counter Support - 3-5 reps - 1 sets - 2x daily - 6x weekly  Standing Tandem Balance with Counter Support - 3 reps - 1 sets - 30 hold - 2x daily - 6x weekly  Standing Single Leg Stance with Counter Support - 10 reps - 3 sets - 2x daily - 6x weekly  Sit to Stand without Arm Support - 10 reps - 1-2 sets - 2x daily - 6x weekly

## 2019-03-05 NOTE — Therapy (Signed)
[facility] [address] [place], [place], [address] Phone: [phone]   Fax:  [phone]  Physical Therapy Evaluation  Patient Details  Name: Deanna [name] MRN: [id number] Date of Birth: 10/03/1950 Referring Provider (PT): Letta Pate Luanna Salk, MD   Encounter Date: 03/04/2019  PT End of Session - 03/04/19 1643    Visit Number  1    Number of Visits  12    Date for PT Re-Evaluation  04/15/19    Authorization Type  M[employer] [place] [employer]    PT Start Time  1400    PT Stop Time  1445    PT Time Calculation (min)  45 min    Equipment Utilized During Treatment  Gait belt    Activity Tolerance  Patient tolerated treatment well       Past Medical History:  Diagnosis Date  . Arthritis    osteoarthritis in  both knees  . Asthma   . Difficult intravenous access   . GERD (gastroesophageal reflux disease)   . Heart murmur    born with years ago  . Pre-diabetes    last A1C=6.0 per pt  . Torn meniscus    left knee w fracture x2 to left knee  . Wears glasses     Past Surgical History:  Procedure Laterality Date  . BUBBLE STUDY  12/02/2018   Procedure: BUBBLE STUDY;  Surgeon: Elouise Munroe, MD;  Location: [place];  Service: Cardiology;;  . CHOLECYSTECTOMY    . CHONDROPLASTY Right 07/09/2017   Procedure: CHONDROPLASTY;  Surgeon: Sydnee Cabal, MD;  Location: [facility];  Service: Orthopedics;  Laterality: Right;  . HERNIA REPAIR Right age 52   inguinal  . IR CT HEAD LTD  11/29/2018  . IR PERCUTANEOUS ART THROMBECTOMY/INFUSION INTRACRANIAL INC DIAG ANGIO  11/29/2018  . KNEE ARTHROSCOPY WITH MEDIAL MENISECTOMY Left 01/10/2017   Procedure: LEFT KNEE ARTHROSCOPY, DEBRIDEMENT, PARTIAL MEDIAL MENISCECTOMY, CHONDROPLASTY, ARTHROSCOPIC ASSISTED INTERNAL FIXATION OF MEDIAL FEMORAL CONDYLE AND MEDIAL TIBIAL PLATEAU INSUFFICIENCY FRACTURES;  Surgeon: Sydnee Cabal, MD;  Location: [place];  Service: Orthopedics;  Laterality: Left;  . KNEE ARTHROSCOPY WITH MEDIAL MENISECTOMY Right 07/09/2017   Procedure: Right knee scope, debridement, chondroplasty, partial meniscectomy, internal arthroscopic assisted internal fixation of medial tibial plateau fracture;  Surgeon: Sydnee Cabal, MD;  Location: [facility];  Service: Orthopedics;  Laterality: Right;  . LOOP RECORDER INSERTION N/A 12/02/2018   Procedure: LOOP RECORDER INSERTION;  Surgeon: Evans Lance, MD;  Location: [place] [facility];  Service: Cardiovascular;  Laterality: N/A;  . RADIOLOGY WITH ANESTHESIA N/A 11/29/2018   Procedure: RADIOLOGY WITH ANESTHESIA;  Surgeon: Luanne Bras, MD;  Location: [place];  Service: Radiology;  Laterality: N/A;  . TEE WITHOUT CARDIOVERSION N/A 12/02/2018   Procedure: TRANSESOPHAGEAL ECHOCARDIOGRAM (TEE);  Surgeon: Elouise Munroe, MD;  Location: [place];  Service: Cardiology;  Laterality: N/A;  . TUBAL LIGATION  age 88    There were no vitals filed for this visit.   Subjective Assessment - 03/04/19 1409    Subjective  68 year old female with history of prediabetes, OA bilateral knees, obesity who was admitted on 11/29/2018 with left-sided tingling and left lower extremity weakness with right gaze preference.  CTA head neck with perfusion showed proximal right M2 occlusion with associated right MCA infarct with penumbra and moderate cervical artery atherosclerosis.  She underwent cerebral Angie with endovascular revascularization of right MCA by Dr. Terrilee Files.  Follow-up MRI brain  showed moderate acute right-MCA infarct with petechial hemorrhage in 1 of 2 acute left frontoparietal infarcts.    On 07/20, she had increasing left-sided weakness with nausea and follow-up CT head showed large edematous infarct in right MCA with substantial increase in hypodensity and new hemorrhagic conversion in deep white matter of posterior frontal and temporal lobes. She is using  RW for balance now but did not use AD prior and has goal to move to only needing SPC. Independent with most ADL's now and vision is improving some.    Pertinent History  CVA Rt MCA July 2020    Limitations  Lifting;Standing;Walking;House hold activities    How long can you stand comfortably?  15 min if she has UE support    How long can you walk comfortably?  limited community ambulator with RW, has not been to grocery store.    Patient Stated Goals  work in the garden again.    Currently in Pain?  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      Referring Provider (PT)  Letta Pate, Luanna Salk, MD    Onset Date/Surgical Date  11/29/18    Next MD Visit  unsure    Prior Therapy  HHPT that ended 02/24/19      Precautions   Precautions  Fall      Balance Screen   Has the patient fallen in the past 6 months  No      [place] residence    Living Arrangements  Spouse/significant other      Prior Function   Level of Independence  Independent      Cognition   Overall Cognitive Status  Impaired/Different from baseline    Area of Impairment  Attention;Memory;Awareness;Safety/judgement      Observation/Other Assessments   Observations  Lt peipheral vision mildly limited compared to Rt side      Sensation   Light Touch  Appears Intact      Coordination   Gross Motor Movements are Fluid and Coordinated  No    Coordination and Movement Description  Lt arm inattention at times    Finger Nose Finger Test  decreased on Lt, WFL on Rt    Heel Shin Test  decreased on Lt, WFL on Rt      ROM / Strength   AROM / PROM / Strength  AROM;Strength      AROM   Overall AROM Comments  UE AROM about 75-90% on Lt compared to Rt      Strength   Overall Strength Comments  Lt UE overall 4/5 MMT grossly compared to Rt UE 5/5,  Lt LE grossly 4+/5 compared to 5/5 on Rt LE      Transfers   Comments  mod I with stand pivot tansfers using RW, needs min A if no AD, can perform sit to stand independently      Ambulation/Gait   Ambulation/Gait  Yes    Gait velocity  9 sec for 20 ft=2.2 ft/sec using Rw    Gait Comments  supervision with RW for 50 ft X 2 slower velocity decreased step length, ambulated without RW with min to mod A for balance and slower speed and decreaesd Lt arm swing.      Standardized Balance Assessment  Standardized Balance Assessment  Timed Up and Go Test;Berg Balance Test;Five Times Sit to Stand    Five times sit to stand comments   15 sec no UE      Berg Balance Test   Sit to Stand  Able to stand without using hands and stabilize independently    Standing Unsupported  Able to stand 2 minutes with supervision    Sitting with Back Unsupported but Feet Supported on Floor or Stool  Able to sit safely and securely 2 minutes    Stand to Sit  Controls descent by using hands    Transfers  Able to transfer with verbal cueing and /or supervision    Standing Unsupported with Eyes Closed  Able to stand 10 seconds with supervision    Standing Unsupported with Feet Together  Able to place feet together independently but unable to hold for 30 seconds    From Standing, Reach Forward with Outstretched Arm  Can reach forward >12 cm safely (5")    From Standing Position, Pick up Object from Floor  Able to pick up shoe, needs supervision    From Standing Position, Turn to Look Behind Over each Shoulder  Looks behind from both sides and weight shifts well    Turn 360 Degrees  Needs assistance while turning    Standing Unsupported, Alternately Place Feet on Step/Stool  Needs assistance to keep from falling or unable to try    Standing Unsupported, One Foot in Front  Needs help to step but can hold 15 seconds    Standing on One Leg  Tries to lift leg/unable to hold 3 seconds but remains standing independently    Total  Score  33      Timed Up and Go Test   TUG  Normal TUG    Normal TUG (seconds)  18                Objective measurements completed on examination: See above findings.              PT Education - 03/04/19 1643    Education Details  exam findings, HEP, POC    Person(s) Educated  Patient;Child(ren)    Methods  Explanation;Demonstration;Verbal cues;Handout    Comprehension  Verbalized understanding;Need further instruction          PT Long Term Goals - 03/05/19 0738      PT LONG TERM GOAL #1   Title  Pt will be I and compliant with HEP. (Target for all goals 6 weeks 04/15/19)    Status  New      PT LONG TERM GOAL #2   Title  Pt will improve TUG to less than 13 sec with LRAD to show improved gait and balance.    Baseline  18    Status  New      PT LONG TERM GOAL #3   Title  Pt will improve 5TSTS to less than 13 sec to show improved balance and LE strength    Baseline  15.2    Status  New      PT LONG TERM GOAL #4   Title  Pt will improve BERG balance test >47 to show improved balance and less need for AD.    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 She has overall decreased strength and coordination on her Lt side, decreased balance, difficulty with walking and now using RW for balance when she did not need AD prior, and decreased activity tolerance. She will benefit from skilled PT to adress her deficits.    Personal Factors and Comorbidities  Comorbidity 1    Comorbidities  CVA, TKA    Examination-Activity Limitations  Carry;Squat;Stairs;Lift;Stand;Locomotion Level;Transfers    Examination-Participation Restrictions  Meal Prep;Cleaning;Community  Activity;Driving;Laundry;Shop    Stability/Clinical Decision Making  Evolving/Moderate complexity    Clinical Decision Making  Moderate    Rehab Potential  Good    PT Frequency  2x / week    PT Duration  6 weeks    PT Treatment/Interventions  ADLs/Self Care Home Management;Aquatic Therapy;Electrical Stimulation;Cryotherapy;Moist Heat;DME Instruction;Gait training;Stair training;Functional mobility training;Therapeutic activities;Therapeutic exercise;Balance training;Neuromuscular re-education;Manual techniques;Orthotic Fit/Training;Passive range of motion;Taping    PT Next Visit Plan  needs LE strengthening, gait, balance    PT Home Exercise Plan  Access Code: [email or web address]    Consulted and Agree with Plan of Care  Patient       Patient will benefit from skilled therapeutic intervention in order to improve the following deficits and impairments:  Abnormal gait, Decreased balance, Decreased coordination, Decreased endurance, Decreased safety awareness, Decreased strength, Difficulty walking, Postural dysfunction, Obesity  Visit Diagnosis: Hemiplegia and hemiparesis following cerebral infarction affecting left non-dominant side (HCC)  Other abnormalities of gait and mobility     Problem List Patient Active Problem List   Diagnosis Date Noted  . Acute bilateral deep vein thrombosis (DVT) of popliteal veins (HCC) 12/26/2018  . Labile blood glucose   . Acute deep vein thrombosis (DVT) of tibial vein of left lower extremity ([place])   . Neurogenic bladder   . Slow transit constipation   . Acute deep vein thrombosis (DVT) of left tibial vein (HCC)   . Acute left hemiparesis ([place]) 12/02/2018  . Hyperlipemia 12/02/2018  . Prediabetes 12/02/2018  . Morbid obesity ([place]) 12/02/2018  . Chronic back pain 12/02/2018  . Urinary retention 12/02/2018  . Acute ischemic right MCA stroke ([place]) 12/02/2018  . Acute ischemic stroke ([place]) large R MCA and 2 L infarcts s/p tPA and mechanical thrombectomy  11/29/2018  . 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 S/P right knee arthroscopy 07/09/2017  . S/P knee surgery 01/10/2017    Silvestre Mesi 03/05/2019, 7:42 AM  [facility] [address] [place], [place], [address] Phone: [phone]   Fax:  [phone]  Name: Deanna [name] MRN: [id number] Date of Birth: August 19, 1950

## 2019-03-06 ENCOUNTER — Encounter: Payer: Self-pay | Admitting: Occupational Therapy

## 2019-03-06 ENCOUNTER — Other Ambulatory Visit: Payer: Self-pay

## 2019-03-06 ENCOUNTER — Ambulatory Visit: Payer: Medicare Other | Admitting: Occupational Therapy

## 2019-03-06 DIAGNOSIS — R278 Other lack of coordination: Secondary | ICD-10-CM | POA: Diagnosis not present

## 2019-03-06 DIAGNOSIS — I69315 Cognitive social or emotional deficit following cerebral infarction: Secondary | ICD-10-CM | POA: Diagnosis not present

## 2019-03-06 DIAGNOSIS — R41842 Visuospatial deficit: Secondary | ICD-10-CM | POA: Diagnosis not present

## 2019-03-06 DIAGNOSIS — R2689 Other abnormalities of gait and mobility: Secondary | ICD-10-CM

## 2019-03-06 DIAGNOSIS — M6281 Muscle weakness (generalized): Secondary | ICD-10-CM | POA: Diagnosis not present

## 2019-03-06 DIAGNOSIS — I69354 Hemiplegia and hemiparesis following cerebral infarction affecting left non-dominant side: Secondary | ICD-10-CM | POA: Diagnosis not present

## 2019-03-06 NOTE — Therapy (Signed)
[facility] [address] [place], [place], [address] Phone: [phone]   Fax:  [phone]  Occupational Therapy Evaluation  Patient Details  Name: Deanna [name] MRN: [id number] Date of Birth: [birth year] Referring Provider (OT): Dr. Letta Pate   Encounter Date: 03/06/2019  OT End of Session - 03/06/19 0814    Visit Number  1    Number of Visits  24    Date for OT Re-Evaluation  06/04/19    Authorization Type  Medicare, [employer]    Authorization Time Period  may d/c earlier dependenting on progress    Authorization - Visit Number  1    Authorization - Number of Visits  10    OT Start Time  0807    OT Stop Time  0845    OT Time Calculation (min)  38 min    Activity Tolerance  Patient tolerated treatment well    Behavior During Therapy  [facility] for tasks assessed/performed       Past Medical History:  Diagnosis Date  . Arthritis    osteoarthritis in  both knees  . Asthma   . Difficult intravenous access   . GERD (gastroesophageal reflux disease)   . Heart murmur    born with years ago  . Pre-diabetes    last A1C=6.0 per pt  . Torn meniscus    left knee w fracture x2 to left knee  . Wears glasses     Past Surgical History:  Procedure Laterality Date  . BUBBLE STUDY  12/02/2018   Procedure: BUBBLE STUDY;  Surgeon: Elouise Munroe, MD;  Location: [facility];  Service: Cardiology;;  . CHOLECYSTECTOMY    . CHONDROPLASTY Right 07/09/2017   Procedure: CHONDROPLASTY;  Surgeon: Sydnee Cabal, MD;  Location: [facility];  Service: Orthopedics;  Laterality: Right;  . HERNIA REPAIR Right age 34   inguinal  . IR CT HEAD LTD  11/29/2018  . IR PERCUTANEOUS ART THROMBECTOMY/INFUSION INTRACRANIAL INC DIAG ANGIO  11/29/2018  . KNEE ARTHROSCOPY WITH MEDIAL MENISECTOMY Left 01/10/2017   Procedure: LEFT KNEE ARTHROSCOPY, DEBRIDEMENT, PARTIAL MEDIAL MENISCECTOMY, CHONDROPLASTY, ARTHROSCOPIC ASSISTED  INTERNAL FIXATION OF MEDIAL FEMORAL CONDYLE AND MEDIAL TIBIAL PLATEAU INSUFFICIENCY FRACTURES;  Surgeon: Sydnee Cabal, MD;  Location: [facility];  Service: Orthopedics;  Laterality: Left;  . KNEE ARTHROSCOPY WITH MEDIAL MENISECTOMY Right 07/09/2017   Procedure: Right knee scope, debridement, chondroplasty, partial meniscectomy, internal arthroscopic assisted internal fixation of medial tibial plateau fracture;  Surgeon: Sydnee Cabal, MD;  Location: [facility];  Service: Orthopedics;  Laterality: Right;  . LOOP RECORDER INSERTION N/A 12/02/2018   Procedure: LOOP RECORDER INSERTION;  Surgeon: Evans Lance, MD;  Location: [facility];  Service: Cardiovascular;  Laterality: N/A;  . RADIOLOGY WITH ANESTHESIA N/A 11/29/2018   Procedure: RADIOLOGY WITH ANESTHESIA;  Surgeon: Luanne Bras, MD;  Location: [facility];  Service: Radiology;  Laterality: N/A;  . TEE WITHOUT CARDIOVERSION N/A 12/02/2018   Procedure: TRANSESOPHAGEAL ECHOCARDIOGRAM (TEE);  Surgeon: Elouise Munroe, MD;  Location: [facility];  Service: Cardiology;  Laterality: N/A;  . TUBAL LIGATION  age 68    There were no vitals filed for this visit.  Subjective Assessment - 03/06/19 1055    Subjective   Denies pain    Pertinent History  68 year old female with history of prediabetes, OA bilateral knees, obesity who was admitted on 11/29/2018 with left-sided tingling and left lower extremity weakness with right gaze preference.  CTA  head neck with perfusion showed proximal right M2 occlusion with associated right MCA infarct with penumbra and moderate cervical artery atherosclerosis.  She underwent cerebral angiogram with endovascular revascularization of right MCA  Follow-up MRI brain showed moderate acute right-MCA infarct with petechial hemorrhage in 1 of 2 acute left frontoparietal infarcts.    On 07/20, she had increasing left-sided weakness with nausea and follow-up CT head showed large  edematous infarct in right MCA with substantial increase in hypodensity and new hemorrhagic conversion in deep white matter of posterior frontal and temporal lobes    Patient Stated Goals  improve use of LUE    Currently in Pain?  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      Referring Provider (OT)  Dr. Letta Pate    Onset Date/Surgical Date  11/29/18    Prior Therapy  [facility] OT that ended 02/24/19      Precautions   Precautions  Fall      Balance Screen   Has the patient fallen in the past 6 months  No    Has the patient had a decrease in activity level because of a fear of falling?   No    Is the patient reluctant to leave their home because of a fear of falling?   No      Home  Environment   Family/patient expects to be discharged to:  Private residence    Home Access  Stairs    Bathroom Shower/Tub  [place]   Level of [place]  yard work, cooking, gardening      ADL   Eating/Feeding  Independent    Grooming  Independent    Scientist, clinical (histocompatibility and immunogenetics)  Supervision/safety    Lower Body Bathing  Supervision/safety    Upper Body Dressing  Supervision/safety    Lower Body Dressing  Supervision/safety    Armed forces technical officer  Modified independent   with walker    Tub/Shower Transfer  Supervision/safety      IADL   Light Housekeeping  Performs light daily tasks such as dishwashing, bed making    Meal Prep  Able to complete simple cold meal and snack prep   has not tried cooking yet   Medication Management  Takes responsibility if medication is prepared in advance in seperate dosage    Financial Management  Dependent   husband      Mobility   Mobility Status  --   supervision -mod I with walker     Written Expression   Dominant Hand  Right      Vision Assessment    Tracking/Visual Pursuits  --   decreased smoothness of tracking   Visual Fields  Left visual field deficit    Comment  L inattention, pt missed 4/80 on number cancellation      Cognition   Overall Cognitive Status  Impaired/Different from baseline    Area of Impairment  Attention;Memory;Awareness;Safety/judgement    Memory  --   recalls 3/3 words following delay   Safety/Judgement  Decreased awareness of deficits    Awareness  Intellectual    Attention  Sustained    Awareness  Impaired    Awareness Impairment  Intellectual impairment;Other (comment)  decreased awareness of LUE deficits   Behaviors  Impulsive    Cognition Comments  spells World backwards       Coordination   Gross Motor Movements are Fluid and Coordinated  No    Fine Motor Movements are Fluid and Coordinated  No    9 Hole Peg Test  Right;Left    Right 9 Hole Peg Test  20.56    Left 9 Hole Peg Test  56.87   several drops   Coordination  impaired for LUE, decreased control      ROM / Strength   AROM / PROM / Strength  AROM      AROM   Overall AROM   Deficits    Overall AROM Comments  RUE WFLS, LUE sh. flexion 105, sh. abduction 105, supination grossly 80%, pronation WFLs, full wrist flexion/ extension and finger flexion and extension      Hand Function   Right Hand Grip (lbs)  51    Left Hand Grip (lbs)  48                        OT Short Term Goals - 03/06/19 1029      OT SHORT TERM GOAL #1   Title  I with HEP    Time  4    Period  Weeks    Status  New    Target Date  04/05/19      OT SHORT TERM GOAL #2   Title  Pt will demonstrate improved fine motor coordination for ADLs as evidenced by decreasing 9 hole peg test score to 50 secs or less.    Baseline  RUE 20.56, LUE 56.87    Time  4    Period  Weeks    Status  New      OT SHORT TERM GOAL #3   Title  Pt will demonstrate ability to retrieve a lightweight object with LUE at 110*sh. flexion  for increased functional reach.     Baseline  L sh. flexion 105    Time  4    Period  Weeks    Status  New      OT SHORT TERM GOAL #4   Title  Pt will perform basic cooking with supervision and min v.c    Time  4    Period  Weeks    Status  New      OT SHORT TERM GOAL #5   Title  Pt will perform environmental scanning with 80% or better accuracy.    Time  4    Period  Weeks    Status  New        OT Long Term Goals - 03/06/19 1034      OT LONG TERM GOAL #1   Title  I with updated HEP.    Time  12    Period  Weeks    Status  New    Target Date  06/04/19      OT LONG TERM GOAL #2   Title  Pt will demonstrate improved fine motor coordination for IADLS  as evidenced  by decreasing 9 hole peg test score to 45 secs or less.    Baseline  LUE 56.87 secs    Time  12    Period  Weeks    Status  New      OT LONG TERM GOAL #3   Title  Pt will demonstrate ability to retrieve a 1  lbs object from overhead shelf at 115* with good control.    Baseline  L sh. flex 105    Time  12    Period  Weeks    Status  New      OT LONG TERM GOAL #4   Title  Pt will resume perfromance of mod complex cooking and home management modified independently.    Time  12    Period  Weeks    Status  New      OT LONG TERM GOAL #5   Title  Pt will perfrom environmental scanning in a busy environment with 90% or better accuracy.    Time  12    Period  Weeks    Status  New      Long Term Additional Goals   Additional Long Term Goals  Yes      OT LONG TERM GOAL #6   Title  Pt will demonstrate ability to perfrom a physical and cognitive task simultaneously with 90% or better accuracy in prep for return to driving.    Time  12    Period  Weeks    Status  New            Plan - 03/06/19 1042    Clinical Impression Statement  68 year old female with history of prediabetes, OA bilateral knees, obesity who was admitted on 11/29/2018 with left-sided tingling and left lower extremity weakness with right gaze preference.  CTA head neck  with perfusion showed proximal right M2 occlusion with associated right MCA infarct with penumbra and moderate cervical artery atherosclerosis.  She underwent cerebral angiogram with endovascular revascularization of right MCA  Follow-up MRI brain showed moderate acute right-MCA infarct with petechial hemorrhage in 1 of 2 acute left frontoparietal infarcts.    On 07/20, she had increasing left-sided weakness with nausea and follow-up CT head showed large edematous infarct in right MCA with substantial increase in hypodensity and new hemorrhagic conversion in deep white matter of posterior frontal and temporal lobes. Pt. received therapies at [facility] and PT. Pt presents to occupational therapy with the following deficits: decreased coordination, L inattention, decreased LUE strength, decreased balance, cognitive deficits, visual field impairment which impedes perfromacne of ADLs/IADLs.Pt can benefit from skilled occupational therapy to maximize pt's safety and independence with daily activities.    OT Occupational Profile and History  Detailed Assessment- Review of Records and additional review of physical, cognitive, psychosocial history related to current functional performance    Occupational performance deficits (Please refer to evaluation for details):  ADL's;IADL's;Leisure;Social Participation    Body Structure / Function / Physical Skills  ADL;Balance;Mobility;Strength;UE functional use;FMC;Coordination;Gait;Vision;ROM;GMC;Decreased knowledge of precautions;Decreased knowledge of use of DME;IADL;Dexterity;Sensation    Cognitive Skills  Attention;Safety Awareness;Problem Solve    Rehab Potential  Good    Clinical Decision Making  Limited treatment options, no task modification necessary    Comorbidities Affecting Occupational Performance:  May have comorbidities impacting occupational performance    Modification or Assistance to Complete Evaluation   No modification of tasks or assist  necessary to complete eval    OT Frequency  2x / week   plus eval, anticipate d/c after 8 weeks   OT Duration  12 weeks    OT Treatment/Interventions  Self-care/ADL training;Therapeutic exercise;Balance training;Functional Mobility Training;Manual Therapy;Neuromuscular education;Ultrasound;Aquatic Therapy;Therapeutic activities;Cognitive remediation/compensation;DME and/or AE instruction;Paraffin;Cryotherapy;Fluidtherapy;Visual/perceptual remediation/compensation;Patient/family education;Passive range of motion;Moist Heat;Electrical Stimulation    Plan  coordination HEP, environmental scanning/ compensatory strategies for decreased LUE peripheral visual field/  L inattention    Consulted and Agree with Plan of Care  Patient;Family member/caregiver       Patient will benefit from skilled therapeutic intervention in order to improve the following deficits and impairments:   Body Structure / Function / Physical Skills: ADL, Balance, Mobility, Strength, UE functional use, FMC, Coordination, Gait, Vision, ROM, GMC, Decreased knowledge of precautions, Decreased knowledge of use of DME, IADL, Dexterity, Sensation Cognitive Skills: Attention, Safety Awareness, Problem Solve     Visit Diagnosis: Other lack of coordination - Plan: Ot plan of care cert/re-cert  Hemiplegia and hemiparesis following cerebral infarction affecting left non-dominant side ([place]) - Plan: Ot plan of care cert/re-cert  Other abnormalities of gait and mobility - Plan: Ot plan of care cert/re-cert  Cognitive social or emotional deficit following cerebral infarction - Plan: Ot plan of care cert/re-cert  Visuospatial deficit - Plan: Ot plan of care cert/re-cert  Muscle weakness (generalized) - Plan: Ot plan of care cert/re-cert    Problem List Patient Active Problem List   Diagnosis Date Noted  . Acute bilateral deep vein thrombosis (DVT) of popliteal veins (HCC) 12/26/2018  . Labile blood glucose   . Acute deep vein  thrombosis (DVT) of tibial vein of left lower extremity ([place])   . Neurogenic bladder   . Slow transit constipation   . Acute deep vein thrombosis (DVT) of left tibial vein (HCC)   . Acute left hemiparesis ([place]) 12/02/2018  . Hyperlipemia 12/02/2018  . Prediabetes 12/02/2018  . Morbid obesity ([place]) 12/02/2018  . Chronic back pain 12/02/2018  . Urinary retention 12/02/2018  . Acute ischemic right MCA stroke ([place]) 12/02/2018  . Acute ischemic stroke ([place]) large R MCA and 2 L infarcts s/p tPA and mechanical thrombectomy 11/29/2018  . 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 S/P right knee arthroscopy 07/09/2017  . S/P knee surgery 01/10/2017    Kennesha Brewbaker 03/06/2019, 11:00 AM Theone Murdoch, OTR/L [phone] Phone: [phone] 11:00 AM 03/06/19 [facility] [address] [place], [place], [address] Phone: [phone]   Fax:  [phone]  Name: Sahaana Tritten MRN: [id number] Date of Birth: 09/19/1950

## 2019-03-09 ENCOUNTER — Other Ambulatory Visit: Payer: Self-pay

## 2019-03-09 ENCOUNTER — Ambulatory Visit: Payer: Medicare Other | Admitting: Physical Therapy

## 2019-03-09 DIAGNOSIS — M6281 Muscle weakness (generalized): Secondary | ICD-10-CM

## 2019-03-09 DIAGNOSIS — R2689 Other abnormalities of gait and mobility: Secondary | ICD-10-CM | POA: Diagnosis not present

## 2019-03-09 DIAGNOSIS — I69354 Hemiplegia and hemiparesis following cerebral infarction affecting left non-dominant side: Secondary | ICD-10-CM

## 2019-03-09 DIAGNOSIS — R278 Other lack of coordination: Secondary | ICD-10-CM | POA: Diagnosis not present

## 2019-03-09 DIAGNOSIS — R41842 Visuospatial deficit: Secondary | ICD-10-CM | POA: Diagnosis not present

## 2019-03-09 DIAGNOSIS — I69315 Cognitive social or emotional deficit following cerebral infarction: Secondary | ICD-10-CM | POA: Diagnosis not present

## 2019-03-09 NOTE — Patient Instructions (Signed)
Access Code: [email or web address]  URL: [email or web address]  Date: 03/09/2019  Prepared by: Elsie Ra   Exercises  Seated Ankle Inversion with Resistance - 10 reps - 1-2 sets - 2x daily - 6x weekly  Seated Ankle Dorsiflexion with Anchored Resistance - 10 reps - 1-2 sets - 2x daily - 6x weekly  Seated Ankle Eversion with Resistance - 10 reps - 1-2 sets - 2x daily - 6x weekly  Seated Eccentric Ankle Plantar Flexion with Resistance - Straight Leg - 10 reps - 1-2 sets - 2x daily - 6x weekly  Heel Toe Raises with Counter Support - 10 reps - 2 sets - 2x daily - 6x weekly

## 2019-03-09 NOTE — Therapy (Signed)
[facility] [address] [place], [place], [address] Phone: [phone]   Fax:  [phone]  Physical Therapy Treatment  Patient Details  Name: Deanna [name] MRN: [id number] Date of Birth: 04-Jan-1951 Referring Provider (PT): Letta Pate Luanna Salk, MD   Encounter Date: 03/09/2019  PT End of Session - 03/09/19 1034    Visit Number  2    Number of Visits  12    Date for PT Re-Evaluation  04/15/19    Authorization Type  [employer]    PT Start Time  0931    PT Stop Time  1015    PT Time Calculation (min)  44 min    Equipment Utilized During Treatment  Gait belt    Activity Tolerance  Patient tolerated treatment well       Past Medical History:  Diagnosis Date  . Arthritis    osteoarthritis in  both knees  . Asthma   . Difficult intravenous access   . GERD (gastroesophageal reflux disease)   . Heart murmur    born with years ago  . Pre-diabetes    last A1C=6.0 per pt  . Torn meniscus    left knee w fracture x2 to left knee  . Wears glasses     Past Surgical History:  Procedure Laterality Date  . BUBBLE STUDY  12/02/2018   Procedure: BUBBLE STUDY;  Surgeon: Elouise Munroe, MD;  Location: [facility];  Service: Cardiology;;  . CHOLECYSTECTOMY    . CHONDROPLASTY Right 07/09/2017   Procedure: CHONDROPLASTY;  Surgeon: Sydnee Cabal, MD;  Location: [facility];  Service: Orthopedics;  Laterality: Right;  . HERNIA REPAIR Right age 23   inguinal  . IR CT HEAD LTD  11/29/2018  . IR PERCUTANEOUS ART THROMBECTOMY/INFUSION INTRACRANIAL INC DIAG ANGIO  11/29/2018  . KNEE ARTHROSCOPY WITH MEDIAL MENISECTOMY Left 01/10/2017   Procedure: LEFT KNEE ARTHROSCOPY, DEBRIDEMENT, PARTIAL MEDIAL MENISCECTOMY, CHONDROPLASTY, ARTHROSCOPIC ASSISTED INTERNAL FIXATION OF MEDIAL FEMORAL CONDYLE AND MEDIAL TIBIAL PLATEAU INSUFFICIENCY FRACTURES;  Surgeon: Sydnee Cabal, MD;  Location: [facility];  Service: Orthopedics;  Laterality: Left;  . KNEE ARTHROSCOPY WITH MEDIAL MENISECTOMY Right 07/09/2017   Procedure: Right knee scope, debridement, chondroplasty, partial meniscectomy, internal arthroscopic assisted internal fixation of medial tibial plateau fracture;  Surgeon: Sydnee Cabal, MD;  Location: [facility];  Service: Orthopedics;  Laterality: Right;  . LOOP RECORDER INSERTION N/A 12/02/2018   Procedure: LOOP RECORDER INSERTION;  Surgeon: Evans Lance, MD;  Location: [facility];  Service: Cardiovascular;  Laterality: N/A;  . RADIOLOGY WITH ANESTHESIA N/A 11/29/2018   Procedure: RADIOLOGY WITH ANESTHESIA;  Surgeon: Luanne Bras, MD;  Location: [place];  Service: Radiology;  Laterality: N/A;  . TEE WITHOUT CARDIOVERSION N/A 12/02/2018   Procedure: TRANSESOPHAGEAL ECHOCARDIOGRAM (TEE);  Surgeon: Elouise Munroe, MD;  Location: [facility];  Service: Cardiology;  Laterality: N/A;  . TUBAL LIGATION  age 43    There were no vitals filed for this visit.  Subjective Assessment - 03/09/19 0938    Subjective  Some Lt foot swelling and difficulty picking it up today. No pain    Pertinent History  CVA Rt MCA July 2020    Limitations  Lifting;Standing;Walking;House hold activities    How long can you stand comfortably?  15 min if she has UE support    How long can you walk comfortably?  limited community ambulator with RW, has not been to grocery store.  Patient Stated Goals  work in the garden again.    Currently in Pain?  No/denies        [facility] Adult PT Treatment/Exercise - 03/09/19 0001      Neuro Re-ed    Neuro Re-ed Details   tandem balance 30 sec X 2 ea side, step taps with bilat fingertip support X 10 ea side.  On foam pad for balance feet apart eyes open 1 min progressed to adding head turns X 10 lateral and  X10 up/down, progressed to eyes closed 20 sec X 3 (min A for balance with this), then feet together eyes open 1 min.        Exercises   Exercises  Other Exercises    Other Exercises   Nu step L3 5 min UE/LE for endurance and reciprocal coordination, step ups 6 inch with bilat UE support 2X 5 on Lt, 1 set of 5 on Rt.  Heel toe raises with bilat UE support X 15 ea (has difficulty with Lt dorsiflexion). Standing hip abd, march, H.S curls all X 15 bilat with UE support. Seated ankle 4 way on Lt with yellow band X 15 reps ea.             PT Education - 03/09/19 1034    Education Details  updated HEP to include Lt ankle 4 way and heel toe raises    Person(s) Educated  Patient    Methods  Explanation;Demonstration;Verbal cues;Handout    Comprehension  Verbalized understanding;Returned demonstration;Need further instruction          PT Long Term Goals - 03/05/19 [id number]      PT LONG TERM GOAL #1   Title  Pt will be I and compliant with HEP. (Target for all goals 6 weeks 04/15/19)    Status  New      PT LONG TERM GOAL #2   Title  Pt will improve TUG to less than 13 sec with LRAD to show improved gait and balance.    Baseline  18    Status  New      PT LONG TERM GOAL #3   Title  Pt will improve 5TSTS to less than 13 sec to show improved balance and LE strength    Baseline  15.2    Status  New      PT LONG TERM GOAL #4   Title  Pt will improve BERG balance test >47 to show improved balance and less need for AD.    Baseline  33    Status  New      PT LONG TERM GOAL #5   Title  Pt will be able to ambulate community distances at least 750 ft mod I with [place] - 03/09/19 1035    Clinical Impression Statement  Session focused on progression of strength, gait, and balance training today. She had difficulty advancing Lt leg due to decreased hip flexion and ankle DF. Gave her exercises for this to perform today and to add at home. She will continue to benefit from PT.    Personal Factors and Comorbidities  Comorbidity 1    Comorbidities  CVA, TKA    Examination-Activity  Limitations  Carry;Squat;Stairs;Lift;Stand;Locomotion Level;Transfers    Examination-Participation Restrictions  Meal Prep;Cleaning;Community Activity;Driving;Laundry;Shop    Stability/Clinical Decision Making  Evolving/Moderate complexity    Rehab Potential  Good    PT Frequency  2x /  week    PT Duration  6 weeks    PT Treatment/Interventions  ADLs/Self Care Home Management;Aquatic Therapy;Electrical Stimulation;Cryotherapy;Moist Heat;DME Instruction;Gait training;Stair training;Functional mobility training;Therapeutic activities;Therapeutic exercise;Balance training;Neuromuscular re-education;Manual techniques;Orthotic Fit/Training;Passive range of motion;Taping    PT Next Visit Plan  needs Lt LE strengthening, gait, balance    PT Home Exercise Plan  Access Code: [email or web address] and added ankle 4 way with yellow and standing H/T raises.    Consulted and Agree with Plan of Care  Patient       Patient will benefit from skilled therapeutic intervention in order to improve the following deficits and impairments:  Abnormal gait, Decreased balance, Decreased coordination, Decreased endurance, Decreased safety awareness, Decreased strength, Difficulty walking, Postural dysfunction, Obesity  Visit Diagnosis: Hemiplegia and hemiparesis following cerebral infarction affecting left non-dominant side (HCC)  Other abnormalities of gait and mobility  Other lack of coordination  Muscle weakness (generalized)     Problem List Patient Active Problem List   Diagnosis Date Noted  . Acute bilateral deep vein thrombosis (DVT) of popliteal veins (HCC) 12/26/2018  . Labile blood glucose   . Acute deep vein thrombosis (DVT) of tibial vein of left lower extremity ([place])   . Neurogenic bladder   . Slow transit constipation   . Acute deep vein thrombosis (DVT) of left tibial vein (HCC)   . Acute left hemiparesis ([place]) 12/02/2018  . Hyperlipemia 12/02/2018  . Prediabetes 12/02/2018  . Morbid obesity ([place])  12/02/2018  . Chronic back pain 12/02/2018  . Urinary retention 12/02/2018  . Acute ischemic right MCA stroke ([place]) 12/02/2018  . Acute ischemic stroke ([place]) large R MCA and 2 L infarcts s/p tPA and mechanical thrombectomy 11/29/2018  . 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 S/P right knee arthroscopy 07/09/2017  . S/P knee surgery 01/10/2017    Silvestre Mesi 03/09/2019, 10:37 AM  [facility] [address] [place], [place], [address] Phone: [phone]   Fax:  [phone]  Name: Deanna [name] MRN: [id number] Date of Birth: December 19, 1950

## 2019-03-11 ENCOUNTER — Other Ambulatory Visit: Payer: Self-pay

## 2019-03-11 ENCOUNTER — Ambulatory Visit: Payer: Medicare Other | Admitting: Physical Therapy

## 2019-03-11 DIAGNOSIS — I69354 Hemiplegia and hemiparesis following cerebral infarction affecting left non-dominant side: Secondary | ICD-10-CM | POA: Diagnosis not present

## 2019-03-11 DIAGNOSIS — R2689 Other abnormalities of gait and mobility: Secondary | ICD-10-CM | POA: Diagnosis not present

## 2019-03-11 DIAGNOSIS — R278 Other lack of coordination: Secondary | ICD-10-CM

## 2019-03-11 DIAGNOSIS — M6281 Muscle weakness (generalized): Secondary | ICD-10-CM

## 2019-03-11 DIAGNOSIS — R41842 Visuospatial deficit: Secondary | ICD-10-CM | POA: Diagnosis not present

## 2019-03-11 DIAGNOSIS — I69315 Cognitive social or emotional deficit following cerebral infarction: Secondary | ICD-10-CM | POA: Diagnosis not present

## 2019-03-11 NOTE — Therapy (Signed)
[facility] [address] [place], [place], [address] Phone: [phone]   Fax:  [phone]  Physical Therapy Treatment  Patient Details  Name: Deanna [name] MRN: [id number] Date of Birth: Sep 12, 1950 Referring Provider (PT): Letta Pate Luanna Salk, MD   Encounter Date: 03/11/2019  PT End of Session - 03/11/19 1017    Visit Number  3    Number of Visits  12    Date for PT Re-Evaluation  04/15/19    Authorization Type  [employer]    PT Start Time  0930    PT Stop Time  1015    PT Time Calculation (min)  45 min    Equipment Utilized During Treatment  Gait belt    Activity Tolerance  Patient tolerated treatment well    Behavior During Therapy  [facility] for tasks assessed/performed       Past Medical History:  Diagnosis Date  . Arthritis    osteoarthritis in  both knees  . Asthma   . Difficult intravenous access   . GERD (gastroesophageal reflux disease)   . Heart murmur    born with years ago  . Pre-diabetes    last A1C=6.0 per pt  . Torn meniscus    left knee w fracture x2 to left knee  . Wears glasses     Past Surgical History:  Procedure Laterality Date  . BUBBLE STUDY  12/02/2018   Procedure: BUBBLE STUDY;  Surgeon: Elouise Munroe, MD;  Location: [place];  Service: Cardiology;;  . CHOLECYSTECTOMY    . CHONDROPLASTY Right 07/09/2017   Procedure: CHONDROPLASTY;  Surgeon: Sydnee Cabal, MD;  Location: [facility];  Service: Orthopedics;  Laterality: Right;  . HERNIA REPAIR Right age 50   inguinal  . IR CT HEAD LTD  11/29/2018  . IR PERCUTANEOUS ART THROMBECTOMY/INFUSION INTRACRANIAL INC DIAG ANGIO  11/29/2018  . KNEE ARTHROSCOPY WITH MEDIAL MENISECTOMY Left 01/10/2017   Procedure: LEFT KNEE ARTHROSCOPY, DEBRIDEMENT, PARTIAL MEDIAL MENISCECTOMY, CHONDROPLASTY, ARTHROSCOPIC ASSISTED INTERNAL FIXATION OF MEDIAL FEMORAL CONDYLE AND MEDIAL TIBIAL PLATEAU INSUFFICIENCY  FRACTURES;  Surgeon: Sydnee Cabal, MD;  Location: [place];  Service: Orthopedics;  Laterality: Left;  . KNEE ARTHROSCOPY WITH MEDIAL MENISECTOMY Right 07/09/2017   Procedure: Right knee scope, debridement, chondroplasty, partial meniscectomy, internal arthroscopic assisted internal fixation of medial tibial plateau fracture;  Surgeon: Sydnee Cabal, MD;  Location: [facility];  Service: Orthopedics;  Laterality: Right;  . LOOP RECORDER INSERTION N/A 12/02/2018   Procedure: LOOP RECORDER INSERTION;  Surgeon: Evans Lance, MD;  Location: [place] [facility];  Service: Cardiovascular;  Laterality: N/A;  . RADIOLOGY WITH ANESTHESIA N/A 11/29/2018   Procedure: RADIOLOGY WITH ANESTHESIA;  Surgeon: Luanne Bras, MD;  Location: [place];  Service: Radiology;  Laterality: N/A;  . TEE WITHOUT CARDIOVERSION N/A 12/02/2018   Procedure: TRANSESOPHAGEAL ECHOCARDIOGRAM (TEE);  Surgeon: Elouise Munroe, MD;  Location: [place];  Service: Cardiology;  Laterality: N/A;  . TUBAL LIGATION  age 70    There were no vitals filed for this visit.  Subjective Assessment - 03/11/19 0953    Subjective  Some Lt foot swelling and difficulty picking it up today. No pain    Pertinent History  CVA Rt MCA July 2020    Limitations  Lifting;Standing;Walking;House hold activities    How long can you stand comfortably?  15 min if she has UE support    How long can you walk comfortably?  limited community  ambulator with RW, has not been to grocery store.    Patient Stated Goals  work in the garden again.                       [place] Adult PT Treatment/Exercise - 03/11/19 0001      Ambulation/Gait   Ambulation/Gait  Yes    Ambulation/Gait Assistance  5: Supervision    Assistive device  Rolling walker    Gait Pattern  Decreased step length - right;Decreased step length - left;Decreased stance time - left;Decreased hip/knee flexion - right;Decreased hip/knee flexion -  left;Decreased dorsiflexion - left;Wide base of support;Poor foot clearance - left    Gait Comments  115 ft X 3, gait training to improve Lt hip/knee/DF and foot clearance.      Neuro Re-ed    Neuro Re-ed Details   in bars for walking fwd, retro, sideways with intermittet UE support PRN up/down X 3 ea. Tandem balance 1 min X 1  bilat      Exercises   Other Exercises   Nu step L4 6 min UE/LE for endurance and reciprocal coordination, step ups 6 inch with bilat UE support 2X 5 on Lt, 1 set of 5 on Rt.  seated Heel toe raises X 20 ea(has difficulty with Lt dorsiflexion). Seated ankle 4 way on Lt with yellow band X 20 reps ea.. Sit to stands no UE support 3X5                  PT Long Term Goals - 03/05/19 0738      PT LONG TERM GOAL #1   Title  Pt will be I and compliant with HEP. (Target for all goals 6 weeks 04/15/19)    Status  New      PT LONG TERM GOAL #2   Title  Pt will improve TUG to less than 13 sec with LRAD to show improved gait and balance.    Baseline  18    Status  New      PT LONG TERM GOAL #3   Title  Pt will improve 5TSTS to less than 13 sec to show improved balance and LE strength    Baseline  15.2    Status  New      PT LONG TERM GOAL #4   Title  Pt will improve BERG balance test >47 to show improved balance and less need for AD.    Baseline  33    Status  New      PT LONG TERM GOAL #5   Title  Pt will be able to ambulate community distances at least 750 ft mod I with [place] - 03/11/19 1018    Clinical Impression Statement  Continued to work on Lt leg strength, overall endurance, and gait/balance. After DF strengththening and cues to march Lt leg up taking bigger steps, picking her Lt foot up, and trying to strike on her heel, she was able to show improved Lt foot clearance with gait. She will continue to benefit from skilled PT.    Personal Factors and Comorbidities  Comorbidity 1    Comorbidities  CVA, TKA     Examination-Activity Limitations  Carry;Squat;Stairs;Lift;Stand;Locomotion Level;Transfers    Examination-Participation Restrictions  Meal Prep;Cleaning;Community Activity;Driving;Laundry;Shop    Stability/Clinical Decision Making  Evolving/Moderate complexity    Rehab Potential  Good    PT  Frequency  2x / week    PT Duration  6 weeks    PT Treatment/Interventions  ADLs/Self Care Home Management;Aquatic Therapy;Electrical Stimulation;Cryotherapy;Moist Heat;DME Instruction;Gait training;Stair training;Functional mobility training;Therapeutic activities;Therapeutic exercise;Balance training;Neuromuscular re-education;Manual techniques;Orthotic Fit/Training;Passive range of motion;Taping    PT Next Visit Plan  needs Lt LE strengthening, gait, balance    PT Home Exercise Plan  Access Code: [email or web address] and added ankle 4 way with yellow and standing H/T raises.    Consulted and Agree with Plan of Care  Patient       Patient will benefit from skilled therapeutic intervention in order to improve the following deficits and impairments:  Abnormal gait, Decreased balance, Decreased coordination, Decreased endurance, Decreased safety awareness, Decreased strength, Difficulty walking, Postural dysfunction, Obesity  Visit Diagnosis: Hemiplegia and hemiparesis following cerebral infarction affecting left non-dominant side (HCC)  Other abnormalities of gait and mobility  Other lack of coordination  Muscle weakness (generalized)     Problem List Patient Active Problem List   Diagnosis Date Noted  . Acute bilateral deep vein thrombosis (DVT) of popliteal veins (HCC) 12/26/2018  . Labile blood glucose   . Acute deep vein thrombosis (DVT) of tibial vein of left lower extremity ([place])   . Neurogenic bladder   . Slow transit constipation   . Acute deep vein thrombosis (DVT) of left tibial vein (HCC)   . Acute left hemiparesis ([place]) 12/02/2018  . Hyperlipemia 12/02/2018  . Prediabetes 12/02/2018  .  Morbid obesity ([place]) 12/02/2018  . Chronic back pain 12/02/2018  . Urinary retention 12/02/2018  . Acute ischemic right MCA stroke ([place]) 12/02/2018  . Acute ischemic stroke ([place]) large R MCA and 2 L infarcts s/p tPA and mechanical thrombectomy 11/29/2018  . 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 S/P right knee arthroscopy 07/09/2017  . S/P knee surgery 01/10/2017    Silvestre Mesi 03/11/2019, 10:20 AM  [facility] [address] [place], [place], [address] Phone: [phone]   Fax:  [phone]  Name: Deanna [name] MRN: [id number] Date of Birth: Feb 19, 1951

## 2019-03-15 LAB — CUP PACEART REMOTE DEVICE CHECK
Date Time Interrogation Session: 20201031172722
Implantable Pulse Generator Implant Date: 20200721

## 2019-03-16 ENCOUNTER — Ambulatory Visit (INDEPENDENT_AMBULATORY_CARE_PROVIDER_SITE_OTHER): Payer: Medicare Other | Admitting: *Deleted

## 2019-03-16 DIAGNOSIS — I63511 Cerebral infarction due to unspecified occlusion or stenosis of right middle cerebral artery: Secondary | ICD-10-CM

## 2019-03-18 ENCOUNTER — Encounter: Payer: Medicare Other | Admitting: Occupational Therapy

## 2019-03-20 ENCOUNTER — Ambulatory Visit: Payer: Medicare Other | Attending: Physical Medicine & Rehabilitation | Admitting: Physical Therapy

## 2019-03-20 ENCOUNTER — Ambulatory Visit: Payer: Medicare Other | Admitting: Occupational Therapy

## 2019-03-20 ENCOUNTER — Encounter: Payer: Self-pay | Admitting: Physical Therapy

## 2019-03-20 ENCOUNTER — Other Ambulatory Visit: Payer: Self-pay

## 2019-03-20 DIAGNOSIS — R2689 Other abnormalities of gait and mobility: Secondary | ICD-10-CM | POA: Diagnosis not present

## 2019-03-20 DIAGNOSIS — I69354 Hemiplegia and hemiparesis following cerebral infarction affecting left non-dominant side: Secondary | ICD-10-CM | POA: Insufficient documentation

## 2019-03-20 DIAGNOSIS — M6281 Muscle weakness (generalized): Secondary | ICD-10-CM | POA: Diagnosis not present

## 2019-03-20 DIAGNOSIS — I69315 Cognitive social or emotional deficit following cerebral infarction: Secondary | ICD-10-CM

## 2019-03-20 DIAGNOSIS — R41842 Visuospatial deficit: Secondary | ICD-10-CM | POA: Diagnosis not present

## 2019-03-20 DIAGNOSIS — R278 Other lack of coordination: Secondary | ICD-10-CM | POA: Insufficient documentation

## 2019-03-20 NOTE — Therapy (Signed)
[place] [address] [place], [place], [address] Phone: [phone]   Fax:  [phone]  Occupational Therapy Treatment  Patient Details  Name: Deanna [name] MRN: [id number] Date of Birth: [birth year] Referring Provider (OT): Dr. Letta Pate   Encounter Date: 03/20/2019  OT End of Session - 03/20/19 1133    Visit Number  2    Number of Visits  24    Date for OT Re-Evaluation  06/04/19    Authorization Type  Medicare, [employer]    Authorization Time Period  may d/c earlier dependenting on progress    Authorization - Visit Number  2    Authorization - Number of Visits  10    OT Start Time  1105    OT Stop Time  1145    OT Time Calculation (min)  40 min    Activity Tolerance  Patient tolerated treatment well    Behavior During Therapy  [facility] for tasks assessed/performed       Past Medical History:  Diagnosis Date  . Arthritis    osteoarthritis in  both knees  . Asthma   . Difficult intravenous access   . GERD (gastroesophageal reflux disease)   . Heart murmur    born with years ago  . Pre-diabetes    last A1C=6.0 per pt  . Torn meniscus    left knee w fracture x2 to left knee  . Wears glasses     Past Surgical History:  Procedure Laterality Date  . BUBBLE STUDY  12/02/2018   Procedure: BUBBLE STUDY;  Surgeon: Elouise Munroe, MD;  Location: [place];  Service: Cardiology;;  . CHOLECYSTECTOMY    . CHONDROPLASTY Right 07/09/2017   Procedure: CHONDROPLASTY;  Surgeon: Sydnee Cabal, MD;  Location: [facility];  Service: Orthopedics;  Laterality: Right;  . HERNIA REPAIR Right age 33   inguinal  . IR CT HEAD LTD  11/29/2018  . IR PERCUTANEOUS ART THROMBECTOMY/INFUSION INTRACRANIAL INC DIAG ANGIO  11/29/2018  . KNEE ARTHROSCOPY WITH MEDIAL MENISECTOMY Left 01/10/2017   Procedure: LEFT KNEE ARTHROSCOPY, DEBRIDEMENT, PARTIAL MEDIAL MENISCECTOMY, CHONDROPLASTY, ARTHROSCOPIC ASSISTED  INTERNAL FIXATION OF MEDIAL FEMORAL CONDYLE AND MEDIAL TIBIAL PLATEAU INSUFFICIENCY FRACTURES;  Surgeon: Sydnee Cabal, MD;  Location: [place];  Service: Orthopedics;  Laterality: Left;  . KNEE ARTHROSCOPY WITH MEDIAL MENISECTOMY Right 07/09/2017   Procedure: Right knee scope, debridement, chondroplasty, partial meniscectomy, internal arthroscopic assisted internal fixation of medial tibial plateau fracture;  Surgeon: Sydnee Cabal, MD;  Location: [facility];  Service: Orthopedics;  Laterality: Right;  . LOOP RECORDER INSERTION N/A 12/02/2018   Procedure: LOOP RECORDER INSERTION;  Surgeon: Evans Lance, MD;  Location: [place] [facility];  Service: Cardiovascular;  Laterality: N/A;  . RADIOLOGY WITH ANESTHESIA N/A 11/29/2018   Procedure: RADIOLOGY WITH ANESTHESIA;  Surgeon: Luanne Bras, MD;  Location: [place];  Service: Radiology;  Laterality: N/A;  . TEE WITHOUT CARDIOVERSION N/A 12/02/2018   Procedure: TRANSESOPHAGEAL ECHOCARDIOGRAM (TEE);  Surgeon: Elouise Munroe, MD;  Location: [place];  Service: Cardiology;  Laterality: N/A;  . TUBAL LIGATION  age 65    There were no vitals filed for this visit.  Subjective Assessment - 03/20/19 1209    Subjective   Denies pain    Pertinent History  68 year old female with history of prediabetes, OA bilateral knees, obesity who was admitted on 11/29/2018 with left-sided tingling and left lower extremity weakness with right gaze preference.  CTA  head neck with perfusion showed proximal right M2 occlusion with associated right MCA infarct with penumbra and moderate cervical artery atherosclerosis.  She underwent cerebral angiogram with endovascular revascularization of right MCA  Follow-up MRI brain showed moderate acute right-MCA infarct with petechial hemorrhage in 1 of 2 acute left frontoparietal infarcts.    On 07/20, she had increasing left-sided weakness with nausea and follow-up CT head showed large  edematous infarct in right MCA with substantial increase in hypodensity and new hemorrhagic conversion in deep white matter of posterior frontal and temporal lobes    Patient Stated Goals  improve use of LUE    Currently in Pain?  No/denies                           OT Treatment/Education - 03/20/19 1207    Education Details  red putty HEP, coordination HEP. see pt instructions, min-mod v.c for LUE awareness    Person(s) Educated  Patient    Methods  Explanation;Demonstration;Verbal cues;Handout    Comprehension  Verbalized understanding;Returned demonstration;Verbal cues required       OT Short Term Goals - 03/06/19 1029      OT SHORT TERM GOAL #1   Title  I with HEP    Time  4    Period  Weeks    Status  New    Target Date  04/05/19      OT SHORT TERM GOAL #2   Title  Pt will demonstrate improved fine motor coordination for ADLs as evidenced by decreasing 9 hole peg test score to 50 secs or less.    Baseline  RUE 20.56, LUE 56.87    Time  4    Period  Weeks    Status  New      OT SHORT TERM GOAL #3   Title  Pt will demonstrate ability to retrieve a lightweight object with LUE at 110*sh. flexion  for increased functional reach.    Baseline  L sh. flexion 105    Time  4    Period  Weeks    Status  New      OT SHORT TERM GOAL #4   Title  Pt will perform basic cooking with supervision and min v.c    Time  4    Period  Weeks    Status  New      OT SHORT TERM GOAL #5   Title  Pt will perform environmental scanning with 80% or better accuracy.    Time  4    Period  Weeks    Status  New        OT Long Term Goals - 03/06/19 1034      OT LONG TERM GOAL #1   Title  I with updated HEP.    Time  12    Period  Weeks    Status  New    Target Date  06/04/19      OT LONG TERM GOAL #2   Title  Pt will demonstrate improved fine motor coordination for IADLS  as evidenced  by decreasing 9 hole peg test score to 45 secs or less.    Baseline  LUE 56.87  secs    Time  12    Period  Weeks    Status  New      OT LONG TERM GOAL #3   Title  Pt will demonstrate ability to retrieve a 1 lbs object from overhead  shelf at 115* with good control.    Baseline  L sh. flex 105    Time  12    Period  Weeks    Status  New      OT LONG TERM GOAL #4   Title  Pt will resume perfromance of mod complex cooking and home management modified independently.    Time  12    Period  Weeks    Status  New      OT LONG TERM GOAL #5   Title  Pt will perfrom environmental scanning in a busy environment with 90% or better accuracy.    Time  12    Period  Weeks    Status  New      Long Term Additional Goals   Additional Long Term Goals  Yes      OT LONG TERM GOAL #6   Title  Pt will demonstrate ability to perfrom a physical and cognitive task simultaneously with 90% or better accuracy in prep for return to driving.    Time  12    Period  Weeks    Status  New            Plan - 03/20/19 1209    Clinical Impression Statement  Pt is progressing towards goals, She demonstrates understanding of HEP but may benefit from reinforcement.    OT Occupational Profile and History  Detailed Assessment- Review of Records and additional review of physical, cognitive, psychosocial history related to current functional performance    Occupational performance deficits (Please refer to evaluation for details):  ADL's;IADL's;Leisure;Social Participation    Body Structure / Function / Physical Skills  ADL;Balance;Mobility;Strength;UE functional use;FMC;Coordination;Gait;Vision;ROM;GMC;Decreased knowledge of precautions;Decreased knowledge of use of DME;IADL;Dexterity;Sensation    Cognitive Skills  Attention;Safety Awareness;Problem Solve    Rehab Potential  Good    Clinical Decision Making  Limited treatment options, no task modification necessary    Comorbidities Affecting Occupational Performance:  May have comorbidities impacting occupational performance    Modification  or Assistance to Complete Evaluation   No modification of tasks or assist necessary to complete eval    OT Frequency  2x / week   plus eval, anticipate d/c after 8 weeks   OT Duration  12 weeks    OT Treatment/Interventions  Self-care/ADL training;Therapeutic exercise;Balance training;Functional Mobility Training;Manual Therapy;Neuromuscular education;Ultrasound;Aquatic Therapy;Therapeutic activities;Cognitive remediation/compensation;DME and/or AE instruction;Paraffin;Cryotherapy;Fluidtherapy;Visual/perceptual remediation/compensation;Patient/family education;Passive range of motion;Moist Heat;Electrical Stimulation    Plan  environmental scanning/ compensatory strategies for decreased LUE peripheral visual field/ L inattention, functional use of LUE/HEP for shoulder    Consulted and Agree with Plan of Care  Patient;Family member/caregiver       Patient will benefit from skilled therapeutic intervention in order to improve the following deficits and impairments:   Body Structure / Function / Physical Skills: ADL, Balance, Mobility, Strength, UE functional use, FMC, Coordination, Gait, Vision, ROM, GMC, Decreased knowledge of precautions, Decreased knowledge of use of DME, IADL, Dexterity, Sensation Cognitive Skills: Attention, Safety Awareness, Problem Solve     Visit Diagnosis: Hemiplegia and hemiparesis following cerebral infarction affecting left non-dominant side (HCC)  Other lack of coordination  Muscle weakness (generalized)  Visuospatial deficit  Cognitive social or emotional deficit following cerebral infarction    Problem List Patient Active Problem List   Diagnosis Date Noted  . Acute bilateral deep vein thrombosis (DVT) of popliteal veins (HCC) 12/26/2018  . Labile blood glucose   . Acute deep vein thrombosis (DVT) of tibial vein of left lower extremity (  HCC)   . Neurogenic bladder   . Slow transit constipation   . Acute deep vein thrombosis (DVT) of left tibial vein  (HCC)   . Acute left hemiparesis ([place]) 12/02/2018  . Hyperlipemia 12/02/2018  . Prediabetes 12/02/2018  . Morbid obesity ([place]) 12/02/2018  . Chronic back pain 12/02/2018  . Urinary retention 12/02/2018  . Acute ischemic right MCA stroke ([place]) 12/02/2018  . Acute ischemic stroke ([place]) large R MCA and 2 L infarcts s/p tPA and mechanical thrombectomy 11/29/2018  . 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 S/P right knee arthroscopy 07/09/2017  . S/P knee surgery 01/10/2017    RINE,KATHRYN 03/20/2019, 12:10 PM Theone Murdoch, OTR/L [phone] Phone: [phone] 12:11 PM 03/20/19 [facility] [address] [place], [place], [address] Phone: [phone]   Fax:  [phone]  Name: Deanna [name] MRN: [id number] Date of Birth: [birth year]

## 2019-03-20 NOTE — Patient Instructions (Signed)
1. Grip Strengthening (Resistive Putty)   Squeeze putty using thumb and all fingers. Repeat _20___ times. Do __2__ sessions per day.   2. Roll putty into tube on table and pinch between each finger and thumb x 10 reps each. (can do ring and small finger together)     Copyright  [employer]. All rights reserved.      Coordination Activities  Perform the following activities for 20 minutes 1 times per day with left hand(s).   Rotate ball in fingertips (clockwise and counter-clockwise).  Toss ball between hands.  Flip cards 1 at a time as fast as you can.  Deal cards with your thumb (Hold deck in hand and push card off top with thumb).  Pick up coins and place in container or coin bank.  Pick up coins and stack.

## 2019-03-22 NOTE — Therapy (Signed)
[facility] [address] [place], [place], [address] Phone: [phone]   Fax:  [phone]  Physical Therapy Treatment  Patient Details  Name: Deanna [name] MRN: [id number] Date of Birth: 02-Aug-1950 Referring Provider (PT): Charlett Blake, MD   Encounter Date: 03/20/2019     03/20/19 1146  PT Visits / Re-Eval  Visit Number 4  Number of Visits 12  Date for PT Re-Evaluation 04/15/19  Authorization  Authorization Type MCR/[employer] [place] [employer]  PT Time Calculation  PT Start Time 1146  PT Stop Time 1229  PT Time Calculation (min) 43 min  PT - End of Session  Equipment Utilized During Treatment Gait belt  Activity Tolerance Patient tolerated treatment well  Behavior During Therapy [facility] for tasks assessed/performed    Past Medical History:  Diagnosis Date  . Arthritis    osteoarthritis in  both knees  . Asthma   . Difficult intravenous access   . GERD (gastroesophageal reflux disease)   . Heart murmur    born with years ago  . Pre-diabetes    last A1C=6.0 per pt  . Torn meniscus    left knee w fracture x2 to left knee  . Wears glasses     Past Surgical History:  Procedure Laterality Date  . BUBBLE STUDY  12/02/2018   Procedure: BUBBLE STUDY;  Surgeon: Elouise Munroe, MD;  Location: [place];  Service: Cardiology;;  . CHOLECYSTECTOMY    . CHONDROPLASTY Right 07/09/2017   Procedure: CHONDROPLASTY;  Surgeon: Sydnee Cabal, MD;  Location: [facility];  Service: Orthopedics;  Laterality: Right;  . HERNIA REPAIR Right age 9   inguinal  . IR CT HEAD LTD  11/29/2018  . IR PERCUTANEOUS ART THROMBECTOMY/INFUSION INTRACRANIAL INC DIAG ANGIO  11/29/2018  . KNEE ARTHROSCOPY WITH MEDIAL MENISECTOMY Left 01/10/2017   Procedure: LEFT KNEE ARTHROSCOPY, DEBRIDEMENT, PARTIAL MEDIAL MENISCECTOMY, CHONDROPLASTY, ARTHROSCOPIC ASSISTED INTERNAL FIXATION OF MEDIAL FEMORAL CONDYLE AND MEDIAL TIBIAL  PLATEAU INSUFFICIENCY FRACTURES;  Surgeon: Sydnee Cabal, MD;  Location: [place];  Service: Orthopedics;  Laterality: Left;  . KNEE ARTHROSCOPY WITH MEDIAL MENISECTOMY Right 07/09/2017   Procedure: Right knee scope, debridement, chondroplasty, partial meniscectomy, internal arthroscopic assisted internal fixation of medial tibial plateau fracture;  Surgeon: Sydnee Cabal, MD;  Location: [facility];  Service: Orthopedics;  Laterality: Right;  . LOOP RECORDER INSERTION N/A 12/02/2018   Procedure: LOOP RECORDER INSERTION;  Surgeon: Evans Lance, MD;  Location: [place] [facility];  Service: Cardiovascular;  Laterality: N/A;  . RADIOLOGY WITH ANESTHESIA N/A 11/29/2018   Procedure: RADIOLOGY WITH ANESTHESIA;  Surgeon: Luanne Bras, MD;  Location: [place];  Service: Radiology;  Laterality: N/A;  . TEE WITHOUT CARDIOVERSION N/A 12/02/2018   Procedure: TRANSESOPHAGEAL ECHOCARDIOGRAM (TEE);  Surgeon: Elouise Munroe, MD;  Location: [place];  Service: Cardiology;  Laterality: N/A;  . TUBAL LIGATION  age 42    There were no vitals filed for this visit.     03/20/19 1145  Symptoms/Limitations  Subjective No new complaints. Reports she was sore for a few days after last session. No pain today. Used her "knee gel" prior to coming to therapy today.  Pertinent History CVA Rt MCA July 2020  Limitations Lifting;Standing;Walking;House hold activities  How long can you stand comfortably? 15 min if she has UE support  How long can you walk comfortably? limited community ambulator with RW, has not been to grocery store.  Patient Stated Goals work in the  garden again.  Pain Assessment  Currently in Pain? No/denies      03/20/19 1147  Transfers  Transfers Sit to Stand;Stand to Sit  Sit to Stand 5: Supervision;With upper extremity assist;From chair/3-in-1;From bed  Stand to Sit 5: Supervision;With upper extremity assist;To bed;To chair/3-in-1  Ambulation/Gait   Ambulation/Gait Yes  Ambulation/Gait Assistance 5: Supervision;4: Min guard;4: Min assist  Ambulation/Gait Assistance Details use of walker mostly. Did initiated gait with cane for short household distances- use of small base quad cane with min guard to min assist, cues on posture and sequencing. (~15 feet x2);  then with cane with rubber quad tip- 30 feet with min guard to min assist with improved sequencing, increased lateral sway with gait. Pt prefers the small base quad cane and believes this is the one she has at home, will check this out when she gets home today.    Ambulation Distance (Feet)  (see above. )  Assistive device Rolling walker  Gait Pattern Step-through pattern;Decreased stride length;Decreased stance time - left;Decreased step length - right;Decreased weight shift to left;Antalgic;Trunk flexed;Narrow base of support  Ambulation Surface Level;Indoor  Neuro Re-ed   Neuro Re-ed Details  for balance/muscle re-ed: on blue mat in parallel bars- side stepping left<>right, marching fwd/bwd, tandem gait fwd/bwd for 3 laps each, min guard assist with cues on ex form and technique.   Exercises  Exercises Other Exercises  Other Exercises  Nustep level 6 for 5 minutes with UE/LE with goal >/= 40 steps per minute for strengthening and activity tolerance.         PT Long Term Goals - 03/05/19 [id number]      PT LONG TERM GOAL #1   Title  Pt will be I and compliant with HEP. (Target for all goals 6 weeks 04/15/19)    Status  New      PT LONG TERM GOAL #2   Title  Pt will improve TUG to less than 13 sec with LRAD to show improved gait and balance.    Baseline  18    Status  New      PT LONG TERM GOAL #3   Title  Pt will improve 5TSTS to less than 13 sec to show improved balance and LE strength    Baseline  15.2    Status  New      PT LONG TERM GOAL #4   Title  Pt will improve BERG balance test >47 to show improved balance and less need for AD.    Baseline  33    Status  New       PT LONG TERM GOAL #5   Title  Pt will be able to ambulate community distances at least 750 ft mod I with LRAD    Status  New         03/20/19 1147  Plan  Clinical Impression Statement Today's skilled session continued to address strengthening and balance reactions with rest breaks needed due to fatigue. Also initiated gait training with cane with up to min assist needed for short distances. The pt is progressing toward goals and should benefit from continued PT to progress toward unmet goals.  Personal Factors and Comorbidities Comorbidity 1  Comorbidities CVA, TKA  Examination-Activity Limitations Carry;Squat;Stairs;Lift;Stand;Locomotion Level;Transfers  Examination-Participation Restrictions Meal Prep;Cleaning;Community Activity;Driving;Laundry;Shop  Pt will benefit from skilled therapeutic intervention in order to improve on the following deficits Abnormal gait;Decreased balance;Decreased coordination;Decreased endurance;Decreased safety awareness;Decreased strength;Difficulty walking;Postural dysfunction;Obesity  Stability/Clinical Decision Making Evolving/Moderate complexity  Rehab Potential Good  PT Frequency 2x / week  PT Duration 6 weeks  PT Treatment/Interventions ADLs/Self Care Home Management;Aquatic Therapy;Electrical Stimulation;Cryotherapy;Moist Heat;DME Instruction;Gait training;Stair training;Functional mobility training;Therapeutic activities;Therapeutic exercise;Balance training;Neuromuscular re-education;Manual techniques;Orthotic Fit/Training;Passive range of motion;Taping  PT Next Visit Plan continue to work on LE strengthening, balance reactions on compliant surfaces and gait with small base quad cane for short distances.  PT Home Exercise Plan Access Code: [email or web address] and added ankle 4 way with yellow and standing H/T raises.  Consulted and Agree with Plan of Care Patient          Patient will benefit from skilled therapeutic intervention in order to improve the  following deficits and impairments:  Abnormal gait, Decreased balance, Decreased coordination, Decreased endurance, Decreased safety awareness, Decreased strength, Difficulty walking, Postural dysfunction, Obesity  Visit Diagnosis: Hemiplegia and hemiparesis following cerebral infarction affecting left non-dominant side (HCC)  Other abnormalities of gait and mobility  Muscle weakness (generalized)     Problem List Patient Active Problem List   Diagnosis Date Noted  . Acute bilateral deep vein thrombosis (DVT) of popliteal veins (HCC) 12/26/2018  . Labile blood glucose   . Acute deep vein thrombosis (DVT) of tibial vein of left lower extremity ([place])   . Neurogenic bladder   . Slow transit constipation   . Acute deep vein thrombosis (DVT) of left tibial vein (HCC)   . Acute left hemiparesis ([place]) 12/02/2018  . Hyperlipemia 12/02/2018  . Prediabetes 12/02/2018  . Morbid obesity ([place]) 12/02/2018  . Chronic back pain 12/02/2018  . Urinary retention 12/02/2018  . Acute ischemic right MCA stroke ([place]) 12/02/2018  . Acute ischemic stroke ([place]) large R MCA and 2 L infarcts s/p tPA and mechanical thrombectomy 11/29/2018  . 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 S/P right knee arthroscopy 07/09/2017  . S/P knee surgery 01/10/2017    Willow Ora, PTA, [facility] [address], [place], [place] [address] [phone] 03/22/19, 9:14 PM   Name: Deanna [name] MRN: [id number] Date of Birth: [birth year]

## 2019-03-25 ENCOUNTER — Ambulatory Visit: Payer: Medicare Other | Admitting: Occupational Therapy

## 2019-03-25 ENCOUNTER — Ambulatory Visit: Payer: Medicare Other | Admitting: Physical Therapy

## 2019-03-25 ENCOUNTER — Telehealth: Payer: Self-pay | Admitting: Adult Health

## 2019-03-25 NOTE — Telephone Encounter (Signed)
New order placed

## 2019-03-25 NOTE — Telephone Encounter (Signed)
Please Re- order [id number] Venus Low ext . Rule out DVT.Marland Kitchen Patient's apt is Friday

## 2019-03-25 NOTE — Addendum Note (Signed)
Addended by: Mal Misty on: 03/25/2019 03:49 PM   Modules accepted: Orders

## 2019-03-25 NOTE — Telephone Encounter (Signed)
Noted  

## 2019-03-26 ENCOUNTER — Encounter: Payer: Self-pay | Admitting: Physical Therapy

## 2019-03-26 ENCOUNTER — Ambulatory Visit: Payer: Medicare Other | Admitting: Physical Therapy

## 2019-03-26 ENCOUNTER — Ambulatory Visit: Payer: Medicare Other | Admitting: Occupational Therapy

## 2019-03-26 ENCOUNTER — Telehealth: Payer: Self-pay | Admitting: Adult Health

## 2019-03-26 ENCOUNTER — Other Ambulatory Visit: Payer: Self-pay

## 2019-03-26 ENCOUNTER — Encounter: Payer: Medicare Other | Attending: Physical Medicine & Rehabilitation | Admitting: Physical Medicine & Rehabilitation

## 2019-03-26 ENCOUNTER — Encounter: Payer: Self-pay | Admitting: Physical Medicine & Rehabilitation

## 2019-03-26 VITALS — BP 132/74 | HR 95 | Temp 97.7°F | Ht 61.0 in | Wt 231.0 lb

## 2019-03-26 DIAGNOSIS — M6281 Muscle weakness (generalized): Secondary | ICD-10-CM

## 2019-03-26 DIAGNOSIS — I82433 Acute embolism and thrombosis of popliteal vein, bilateral: Secondary | ICD-10-CM | POA: Insufficient documentation

## 2019-03-26 DIAGNOSIS — I69398 Other sequelae of cerebral infarction: Secondary | ICD-10-CM | POA: Diagnosis not present

## 2019-03-26 DIAGNOSIS — R278 Other lack of coordination: Secondary | ICD-10-CM | POA: Diagnosis not present

## 2019-03-26 DIAGNOSIS — R2689 Other abnormalities of gait and mobility: Secondary | ICD-10-CM

## 2019-03-26 DIAGNOSIS — I69354 Hemiplegia and hemiparesis following cerebral infarction affecting left non-dominant side: Secondary | ICD-10-CM

## 2019-03-26 DIAGNOSIS — I82442 Acute embolism and thrombosis of left tibial vein: Secondary | ICD-10-CM | POA: Diagnosis not present

## 2019-03-26 DIAGNOSIS — R41842 Visuospatial deficit: Secondary | ICD-10-CM | POA: Diagnosis not present

## 2019-03-26 DIAGNOSIS — R269 Unspecified abnormalities of gait and mobility: Secondary | ICD-10-CM | POA: Diagnosis not present

## 2019-03-26 DIAGNOSIS — I639 Cerebral infarction, unspecified: Secondary | ICD-10-CM | POA: Diagnosis not present

## 2019-03-26 DIAGNOSIS — I63511 Cerebral infarction due to unspecified occlusion or stenosis of right middle cerebral artery: Secondary | ICD-10-CM | POA: Diagnosis not present

## 2019-03-26 DIAGNOSIS — R7303 Prediabetes: Secondary | ICD-10-CM | POA: Diagnosis not present

## 2019-03-26 DIAGNOSIS — I69315 Cognitive social or emotional deficit following cerebral infarction: Secondary | ICD-10-CM | POA: Diagnosis not present

## 2019-03-26 NOTE — Telephone Encounter (Signed)
Pts daughter called in and stated that OT and PT orders were corrected. Disregard last message.

## 2019-03-26 NOTE — Telephone Encounter (Signed)
Noted  

## 2019-03-26 NOTE — Patient Instructions (Addendum)
Keep up with home exercise DASH Eating Plan DASH stands for "Dietary Approaches to Stop Hypertension." The DASH eating plan is a healthy eating plan that has been shown to reduce high blood pressure (hypertension). It may also reduce your risk for type 2 diabetes, heart disease, and stroke. The DASH eating plan may also help with weight loss. What are tips for following this plan?  General guidelines  Avoid eating more than 2,300 mg (milligrams) of salt (sodium) a day. If you have hypertension, you may need to reduce your sodium intake to 1,500 mg a day.  Limit alcohol intake to no more than 1 drink a day for nonpregnant women and 2 drinks a day for men. One drink equals 12 oz of beer, 5 oz of wine, or 1 oz of hard liquor.  Work with your health care provider to maintain a healthy body weight or to lose weight. Ask what an ideal weight is for you.  Get at least 30 minutes of exercise that causes your heart to beat faster (aerobic exercise) most days of the week. Activities may include walking, swimming, or biking.  Work with your health care provider or diet and nutrition specialist (dietitian) to adjust your eating plan to your individual calorie needs. Reading food labels   Check food labels for the amount of sodium per serving. Choose foods with less than 5 percent of the Daily Value of sodium. Generally, foods with less than 300 mg of sodium per serving fit into this eating plan.  To find whole grains, look for the word "whole" as the first word in the ingredient list. Shopping  Buy products labeled as "low-sodium" or "no salt added."  Buy fresh foods. Avoid canned foods and premade or frozen meals. Cooking  Avoid adding salt when cooking. Use salt-free seasonings or herbs instead of table salt or sea salt. Check with your health care provider or pharmacist before using salt substitutes.  Do not fry foods. Cook foods using healthy methods such as baking, boiling, grilling, and  broiling instead.  Cook with heart-healthy oils, such as olive, canola, soybean, or sunflower oil. Meal planning  Eat a balanced diet that includes: ? 5 or more servings of fruits and vegetables each day. At each meal, try to fill half of your plate with fruits and vegetables. ? Up to 6-8 servings of whole grains each day. ? Less than 6 oz of lean meat, poultry, or fish each day. A 3-oz serving of meat is about the same size as a deck of cards. One egg equals 1 oz. ? 2 servings of low-fat dairy each day. ? A serving of nuts, seeds, or beans 5 times each week. ? Heart-healthy fats. Healthy fats called Omega-3 fatty acids are found in foods such as flaxseeds and coldwater fish, like sardines, salmon, and mackerel.  Limit how much you eat of the following: ? Canned or prepackaged foods. ? Food that is high in trans fat, such as fried foods. ? Food that is high in saturated fat, such as fatty meat. ? Sweets, desserts, sugary drinks, and other foods with added sugar. ? Full-fat dairy products.  Do not salt foods before eating.  Try to eat at least 2 vegetarian meals each week.  Eat more home-cooked food and less restaurant, buffet, and fast food.  When eating at a restaurant, ask that your food be prepared with less salt or no salt, if possible. What foods are recommended? The items listed may not be a complete list.  Talk with your dietitian about what dietary choices are best for you. Grains Whole-grain or whole-wheat bread. Whole-grain or whole-wheat pasta. Brown rice. Deanna [name]. Bulgur. Whole-grain and low-sodium cereals. Pita bread. Low-fat, low-sodium crackers. Whole-wheat flour tortillas. Vegetables Fresh or frozen vegetables (raw, steamed, roasted, or grilled). Low-sodium or reduced-sodium tomato and vegetable juice. Low-sodium or reduced-sodium tomato sauce and tomato paste. Low-sodium or reduced-sodium canned vegetables. Fruits All fresh, dried, or frozen fruit. Canned  fruit in natural juice (without added sugar). Meat and other protein foods Skinless chicken or [place]. Ground chicken or [place]. Pork with fat trimmed off. Fish and seafood. Egg whites. Dried beans, peas, or lentils. Unsalted nuts, nut butters, and seeds. Unsalted canned beans. Lean cuts of beef with fat trimmed off. Low-sodium, lean deli meat. Dairy Low-fat (1%) or fat-free (skim) milk. Fat-free, low-fat, or reduced-fat cheeses. Nonfat, low-sodium ricotta or cottage cheese. Low-fat or nonfat yogurt. Low-fat, low-sodium cheese. Fats and oils Soft margarine without trans fats. Vegetable oil. Low-fat, reduced-fat, or light mayonnaise and salad dressings (reduced-sodium). Canola, safflower, olive, soybean, and sunflower oils. Avocado. Seasoning and other foods Herbs. Spices. Seasoning mixes without salt. Unsalted popcorn and pretzels. Fat-free sweets. What foods are not recommended? The items listed may not be a complete list. Talk with your dietitian about what dietary choices are best for you. Grains Baked goods made with fat, such as croissants, muffins, or some breads. Dry pasta or rice meal packs. Vegetables Creamed or fried vegetables. Vegetables in a cheese sauce. Regular canned vegetables (not low-sodium or reduced-sodium). Regular canned tomato sauce and paste (not low-sodium or reduced-sodium). Regular tomato and vegetable juice (not low-sodium or reduced-sodium). Deanna [name]. Olives. Fruits Canned fruit in a light or heavy syrup. Fried fruit. Fruit in cream or butter sauce. Meat and other protein foods Fatty cuts of meat. Ribs. Fried meat. Deanna [name]. Sausage. Bologna and other processed lunch meats. Salami. Fatback. Hotdogs. Bratwurst. Salted nuts and seeds. Canned beans with added salt. Canned or smoked fish. Whole eggs or egg yolks. Chicken or [place] with skin. Dairy Whole or 2% milk, cream, and half-and-half. Whole or full-fat cream cheese. Whole-fat or sweetened yogurt. Full-fat cheese.  Nondairy creamers. Whipped toppings. Processed cheese and cheese spreads. Fats and oils Butter. Stick margarine. Lard. Shortening. Ghee. Bacon fat. Tropical oils, such as coconut, palm kernel, or palm oil. Seasoning and other foods Salted popcorn and pretzels. Onion salt, garlic salt, seasoned salt, table salt, and sea salt. Worcestershire sauce. Tartar sauce. Barbecue sauce. Teriyaki sauce. Soy sauce, including reduced-sodium. Steak sauce. Canned and packaged gravies. Fish sauce. Oyster sauce. Cocktail sauce. Horseradish that you find on the shelf. Ketchup. Mustard. Meat flavorings and tenderizers. Bouillon cubes. Hot sauce and Tabasco sauce. Premade or packaged marinades. Premade or packaged taco seasonings. Relishes. Regular salad dressings. Where to find more information:  National Heart, Lung, and [place]: https://wilson-eaton.com/  American Heart Association: www.heart.org Summary  The DASH eating plan is a healthy eating plan that has been shown to reduce high blood pressure (hypertension). It may also reduce your risk for type 2 diabetes, heart disease, and stroke.  With the DASH eating plan, you should limit salt (sodium) intake to 2,300 mg a day. If you have hypertension, you may need to reduce your sodium intake to 1,500 mg a day.  When on the DASH eating plan, aim to eat more fresh fruits and vegetables, whole grains, lean proteins, low-fat dairy, and heart-healthy fats.  Work with your health care provider or diet and nutrition specialist (dietitian) to adjust  your eating plan to your individual calorie needs. This information is not intended to replace advice given to you by your health care provider. Make sure you discuss any questions you have with your health care provider. Document Released: 04/19/2011 Document Revised: 04/12/2017 Document Reviewed: 04/23/2016 Elsevier Patient Education  2020 [employer].

## 2019-03-26 NOTE — Telephone Encounter (Signed)
Pt's daughter called stating that due to the pt having a Doppler scheduled tomorrow all of her OT and PT appt's have been canceled. They informed her it was because they did not know she had a blood clot and the pt and the family do not understand why they would do that when this is not new news. Please advise.

## 2019-03-26 NOTE — Progress Notes (Signed)
Subjective:    Patient ID: Deanna [name], female    DOB: 25-Sep-1950, 68 y.o.   MRN: [id number] 68 year old female with history of prediabetes,OA bilateral knees,obesitywho was admitted on 11/29/2018 with left-sided tingling and left lower extremity weakness with right gaze preference. CTA head neck with perfusion showed proximal right M2 occlusion with associated right MCA infarct with penumbra and moderate cervical artery atherosclerosis. She underwent cerebral Angie with endovascular revascularization of right MCA by Dr. Terrilee Files. Follow-up MRI brain showed moderate acute right-MCA infarct with petechial hemorrhage in 1 of 2 acute left frontoparietal infarcts. On 07/20,she had increasing left-sided weakness with nausea and follow-up CT head showed large edematous infarct in right MCA with substantial increase in hypodensity and new hemorrhagic conversion in deep white matter of posterior frontal and temporal lobes. She was treated with fluid boluses with recommendations to keep SBP goal less than 160. HPI Has been going to therapy once a week but will start twice a week next week Supervision with transfers  Knee OA acts up time to time Had meniscal repair bilateral knee, no TKR Uses voltaren gel Used to get injections bilateral knees which helped   [employer] cane at home when husband at home  Dressing and bathing Mod I  No falls   Left neglect improving per husband   Has loop recorder   Easier toi get in and out of car and on steps with handrail Pain Inventory Average Pain 0 Pain Right Now 0 My pain is na  In the last 24 hours, has pain interfered with the following? General activity 0 Relation with others 0 Enjoyment of life 0 What TIME of day is your pain at its worst? na Sleep (in general) Good  Pain is worse with: na Pain improves with: na Relief from Meds: na  Mobility use a cane use a walker ability to climb steps?  yes do you drive?   no  Function disabled: date disabled .  Neuro/Psych trouble walking  Prior Studies Any changes since last visit?  no  Physicians involved in your care Any changes since last visit?  no   Family History  Problem Relation Age of Onset   Stroke Father    Stroke Brother    Social History   Socioeconomic History   Marital status: Married    Spouse name: Not on file   Number of children: Not on file   Years of education: Not on file   Highest education level: Not on file  Occupational History   Not on file  Social Needs   Financial resource strain: Not on file   Food insecurity    Worry: Not on file    Inability: Not on file   Transportation needs    Medical: Not on file    Non-medical: Not on file  Tobacco Use   Smoking status: Never Smoker   Smokeless tobacco: Never Used  Substance and Sexual Activity   Alcohol use: No   Drug use: No   Sexual activity: Not on file  Lifestyle   Physical activity    Days per week: Not on file    Minutes per session: Not on file   Stress: Not on file  Relationships   Social connections    Talks on phone: Not on file    Gets together: Not on file    Attends religious service: Not on file    Active member of club or organization: Not on file    Attends  meetings of clubs or organizations: Not on file    Relationship status: Not on file  Other Topics Concern   Not on file  Social History Narrative   Not on file   Past Surgical History:  Procedure Laterality Date   BUBBLE STUDY  12/02/2018   Procedure: BUBBLE STUDY;  Surgeon: Elouise Munroe, MD;  Location: [place];  Service: Cardiology;;   CHOLECYSTECTOMY     CHONDROPLASTY Right 07/09/2017   Procedure: CHONDROPLASTY;  Surgeon: Sydnee Cabal, MD;  Location: [facility];  Service: Orthopedics;  Laterality: Right;   HERNIA REPAIR Right age 69   inguinal   IR CT HEAD LTD  11/29/2018   IR PERCUTANEOUS ART THROMBECTOMY/INFUSION  INTRACRANIAL INC DIAG ANGIO  11/29/2018   KNEE ARTHROSCOPY WITH MEDIAL MENISECTOMY Left 01/10/2017   Procedure: LEFT KNEE ARTHROSCOPY, DEBRIDEMENT, PARTIAL MEDIAL MENISCECTOMY, CHONDROPLASTY, ARTHROSCOPIC ASSISTED INTERNAL FIXATION OF MEDIAL FEMORAL CONDYLE AND MEDIAL TIBIAL PLATEAU INSUFFICIENCY FRACTURES;  Surgeon: Sydnee Cabal, MD;  Location: [place];  Service: Orthopedics;  Laterality: Left;   KNEE ARTHROSCOPY WITH MEDIAL MENISECTOMY Right 07/09/2017   Procedure: Right knee scope, debridement, chondroplasty, partial meniscectomy, internal arthroscopic assisted internal fixation of medial tibial plateau fracture;  Surgeon: Sydnee Cabal, MD;  Location: [facility];  Service: Orthopedics;  Laterality: Right;   LOOP RECORDER INSERTION N/A 12/02/2018   Procedure: LOOP RECORDER INSERTION;  Surgeon: Evans Lance, MD;  Location: [place] [facility];  Service: Cardiovascular;  Laterality: N/A;   RADIOLOGY WITH ANESTHESIA N/A 11/29/2018   Procedure: RADIOLOGY WITH ANESTHESIA;  Surgeon: Luanne Bras, MD;  Location: [place];  Service: Radiology;  Laterality: N/A;   TEE WITHOUT CARDIOVERSION N/A 12/02/2018   Procedure: TRANSESOPHAGEAL ECHOCARDIOGRAM (TEE);  Surgeon: Elouise Munroe, MD;  Location: [place];  Service: Cardiology;  Laterality: N/A;   TUBAL LIGATION  age 33   Past Medical History:  Diagnosis Date   Arthritis    osteoarthritis in  both knees   Asthma    Difficult intravenous access    GERD (gastroesophageal reflux disease)    Heart murmur    born with years ago   Pre-diabetes    last A1C=6.0 per pt   Torn meniscus    left knee w fracture x2 to left knee   Wears glasses    LMP  (LMP Unknown)   Opioid Risk Score:   Fall Risk Score:  `1  Depression screen PHQ 2/9  Depression screen PHQ 2/9 02/04/2019  Decreased Interest 0  Down, Depressed, Hopeless 0  PHQ - 2 Score 0     Review of Systems  Constitutional:  Negative.   HENT: Negative.   Eyes: Negative.   Respiratory: Negative.   Cardiovascular: Negative.   Gastrointestinal: Negative.   Endocrine: Negative.   Genitourinary: Negative.   Musculoskeletal: Positive for gait problem.  Skin: Negative.   Allergic/Immunologic: Negative.   Hematological: Negative.   Psychiatric/Behavioral: Negative.   All other systems reviewed and are negative.      Objective:   Physical Exam Vitals signs and nursing note reviewed.  Constitutional:      Appearance: Normal appearance. She is obese.  HENT:     Head: Normocephalic and atraumatic.  Skin:    General: Skin is warm and dry.  Neurological:     General: No focal deficit present.     Mental Status: She is alert and oriented to person, place, and time.     Sensory: Sensation is intact.  Coordination: Impaired rapid alternating movements.     Gait: Gait abnormal.     Comments: Utilizes cane for ambulation short step length widened base of support  Psychiatric:        Mood and Affect: Mood normal.        Behavior: Behavior normal.   Motor strength is 5/5 bilateral deltoid bicep tricep grip hip flexor, knee extensor, 4/5 left ankle dorsiflexor 5/5 on the right  Visual fields are intact confrontation testing 3/3 unrelated objects after 3-minute delay Is slow with serial sevens but is able to complete them. Is able to name 3 coins that add up to $0.40 without difficulty     Assessment & Plan:  History of right MCA branch infarct, her hemiparesis has largely improved, she still has some fine motor deficits in the left upper limb.  She also has problems with balance.  Cognitively it appears she is doing well, her husband states that she sometimes forgets things he tells her although she maintains that she does not always pay attention to him. She will continue outpatient PT OT I will see her back in 3 months.  No driving at this time Follow-up with primary care as well as neurology

## 2019-03-26 NOTE — Patient Instructions (Signed)
SELF ASSISTED WITH OBJECT: Shoulder Flexion - Sitting    Hold cane with both hands. Raise arms up. 10___ reps per set, _2__ sets per day   Abduction (Eccentric) - Active (Cane)    Lift cane out to side with affected arm. Avoid hiking shoulder. Keep palm relaxed. Slowly lower affected arm for 3-5 seconds. _10__ reps per set, _2__ sets per day      1. Look for the edge of objects (to the left and/or right) so that you make sure you are seeing all of an object 2. Turn your head when walking, scan from side to side, particularly in busy environments 3. Use an organized scanning pattern. It's usually easier to scan from top to bottom, and left to right (like you are reading) 4. Double check yourself 5. Use a line guide (like a blank piece of paper) or your finger when reading 6. If necessary, place brightly colored tape at end of table or work area as a reminder to always look until you see the edge    Activities to try at home to encourage visual scanning:   1. Word searches 2. Mazes 3. Puzzles 4. Card games 5. Computer games and/or searches 6. Connect-the-dots  Activities for environmental (larger) scanning:  1. With supervision, scan for items in grocery store or drugstore.  Begin with a familiar store, then progress to a new store you've never been in before. Make sure you have supervision with this.  2. With supervision, tell a family member or caregiver when it is safe to cross a street after looking all directions and any side streets. However, do NOT cross street unless family member or caregiver is with you and says it is OK

## 2019-03-26 NOTE — Therapy (Signed)
[facility] [address] [place], [place], [address] Phone: [phone]   Fax:  [phone]  Occupational Therapy Treatment  Patient Details  Name: Deanna [name] MRN: [id number] Date of Birth: [birth year] Referring Provider (OT): Dr. Letta Pate   Encounter Date: 03/26/2019  OT End of Session - 03/26/19 1527    Visit Number  3    Number of Visits  24    Date for OT Re-Evaluation  06/04/19    Authorization Type  Medicare, [employer]    Authorization Time Period  may d/c earlier dependenting on progress    Authorization - Visit Number  3    Authorization - Number of Visits  10    OT Start Time  1450    OT Stop Time  1530    OT Time Calculation (min)  40 min    Activity Tolerance  Patient tolerated treatment well    Behavior During Therapy  [facility] for tasks assessed/performed       Past Medical History:  Diagnosis Date  . Arthritis    osteoarthritis in  both knees  . Asthma   . Difficult intravenous access   . GERD (gastroesophageal reflux disease)   . Heart murmur    born with years ago  . Pre-diabetes    last A1C=6.0 per pt  . Torn meniscus    left knee w fracture x2 to left knee  . Wears glasses     Past Surgical History:  Procedure Laterality Date  . BUBBLE STUDY  12/02/2018   Procedure: BUBBLE STUDY;  Surgeon: Elouise Munroe, MD;  Location: [place];  Service: Cardiology;;  . CHOLECYSTECTOMY    . CHONDROPLASTY Right 07/09/2017   Procedure: CHONDROPLASTY;  Surgeon: Sydnee Cabal, MD;  Location: [facility];  Service: Orthopedics;  Laterality: Right;  . HERNIA REPAIR Right age 37   inguinal  . IR CT HEAD LTD  11/29/2018  . IR PERCUTANEOUS ART THROMBECTOMY/INFUSION INTRACRANIAL INC DIAG ANGIO  11/29/2018  . KNEE ARTHROSCOPY WITH MEDIAL MENISECTOMY Left 01/10/2017   Procedure: LEFT KNEE ARTHROSCOPY, DEBRIDEMENT, PARTIAL MEDIAL MENISCECTOMY, CHONDROPLASTY, ARTHROSCOPIC ASSISTED  INTERNAL FIXATION OF MEDIAL FEMORAL CONDYLE AND MEDIAL TIBIAL PLATEAU INSUFFICIENCY FRACTURES;  Surgeon: Sydnee Cabal, MD;  Location: [place];  Service: Orthopedics;  Laterality: Left;  . KNEE ARTHROSCOPY WITH MEDIAL MENISECTOMY Right 07/09/2017   Procedure: Right knee scope, debridement, chondroplasty, partial meniscectomy, internal arthroscopic assisted internal fixation of medial tibial plateau fracture;  Surgeon: Sydnee Cabal, MD;  Location: [facility];  Service: Orthopedics;  Laterality: Right;  . LOOP RECORDER INSERTION N/A 12/02/2018   Procedure: LOOP RECORDER INSERTION;  Surgeon: Evans Lance, MD;  Location: [place] [facility];  Service: Cardiovascular;  Laterality: N/A;  . RADIOLOGY WITH ANESTHESIA N/A 11/29/2018   Procedure: RADIOLOGY WITH ANESTHESIA;  Surgeon: Luanne Bras, MD;  Location: [place];  Service: Radiology;  Laterality: N/A;  . TEE WITHOUT CARDIOVERSION N/A 12/02/2018   Procedure: TRANSESOPHAGEAL ECHOCARDIOGRAM (TEE);  Surgeon: Elouise Munroe, MD;  Location: [place];  Service: Cardiology;  Laterality: N/A;  . TUBAL LIGATION  age 67    There were no vitals filed for this visit.  Subjective Assessment - 03/26/19 1452    Subjective   I've been doing my coordination and putty ex's    Pertinent History  68 year old female with history of prediabetes, OA bilateral knees, obesity who was admitted on 11/29/2018 with left-sided tingling and left lower extremity weakness  with right gaze preference.  CTA head neck with perfusion showed proximal right M2 occlusion with associated right MCA infarct with penumbra and moderate cervical artery atherosclerosis.  She underwent cerebral angiogram with endovascular revascularization of right MCA  Follow-up MRI brain showed moderate acute right-MCA infarct with petechial hemorrhage in 1 of 2 acute left frontoparietal infarcts.    On 07/20, she had increasing left-sided weakness with nausea and  follow-up CT head showed large edematous infarct in right MCA with substantial increase in hypodensity and new hemorrhagic conversion in deep white matter of posterior frontal and temporal lobes    Patient Stated Goals  improve use of LUE    Currently in Pain?  No/denies       Pt issued cane HEP and demo each x 10 reps w/ min cues - see pt instructions for details. UBE x 6 min level 1 for UE strength/endurance. Issued and discussed visual compensatory strategies and ways to encourage scanning at home and in community.  Walker negotiation through tight places in gym environment for visual scanning and safety/fall prevention                    OT Education - 03/26/19 1524    Education Details  visual scanning strategies, shoulder HEP    Person(s) Educated  Patient    Methods  Explanation;Demonstration;Handout    Comprehension  Verbalized understanding;Returned demonstration       OT Short Term Goals - 03/26/19 1525      OT SHORT TERM GOAL #1   Title  I with HEP    Time  4    Period  Weeks    Status  On-going    Target Date  04/05/19      OT SHORT TERM GOAL #2   Title  Pt will demonstrate improved fine motor coordination for ADLs as evidenced by decreasing 9 hole peg test score to 50 secs or less.    Baseline  RUE 20.56, LUE 56.87    Time  4    Period  Weeks    Status  New      OT SHORT TERM GOAL #3   Title  Pt will demonstrate ability to retrieve a lightweight object with LUE at 110*sh. flexion  for increased functional reach.    Baseline  L sh. flexion 105    Time  4    Period  Weeks    Status  New      OT SHORT TERM GOAL #4   Title  Pt will perform basic cooking with supervision and min v.c    Time  4    Period  Weeks    Status  New      OT SHORT TERM GOAL #5   Title  Pt will perform environmental scanning with 80% or better accuracy.    Time  4    Period  Weeks    Status  New        OT Long Term Goals - 03/06/19 1034      OT LONG TERM GOAL  #1   Title  I with updated HEP.    Time  12    Period  Weeks    Status  New    Target Date  06/04/19      OT LONG TERM GOAL #2   Title  Pt will demonstrate improved fine motor coordination for IADLS  as evidenced  by decreasing 9 hole peg test score to 45 secs or  less.    Baseline  LUE 56.87 secs    Time  12    Period  Weeks    Status  New      OT LONG TERM GOAL #3   Title  Pt will demonstrate ability to retrieve a 1 lbs object from overhead shelf at 115* with good control.    Baseline  L sh. flex 105    Time  12    Period  Weeks    Status  New      OT LONG TERM GOAL #4   Title  Pt will resume perfromance of mod complex cooking and home management modified independently.    Time  12    Period  Weeks    Status  New      OT LONG TERM GOAL #5   Title  Pt will perfrom environmental scanning in a busy environment with 90% or better accuracy.    Time  12    Period  Weeks    Status  New      Long Term Additional Goals   Additional Long Term Goals  Yes      OT LONG TERM GOAL #6   Title  Pt will demonstrate ability to perfrom a physical and cognitive task simultaneously with 90% or better accuracy in prep for return to driving.    Time  12    Period  Weeks    Status  New            Plan - 03/26/19 1524    Clinical Impression Statement  Pt progressing towards goals. Pt reports LUE helping more    Occupational performance deficits (Please refer to evaluation for details):  ADL's;IADL's;Leisure;Social Participation    Body Structure / Function / Physical Skills  ADL;Balance;Mobility;Strength;UE functional use;FMC;Coordination;Gait;Vision;ROM;GMC;Decreased knowledge of precautions;Decreased knowledge of use of DME;IADL;Dexterity;Sensation    Cognitive Skills  Attention;Safety Awareness;Problem Solve    Rehab Potential  Good    OT Frequency  2x / week    OT Duration  12 weeks    OT Treatment/Interventions  Self-care/ADL training;Therapeutic exercise;Balance  training;Functional Mobility Training;Manual Therapy;Neuromuscular education;Ultrasound;Aquatic Therapy;Therapeutic activities;Cognitive remediation/compensation;DME and/or AE instruction;Paraffin;Cryotherapy;Fluidtherapy;Visual/perceptual remediation/compensation;Patient/family education;Passive range of motion;Moist Heat;Electrical Stimulation    Plan  continue LUE functional use, environmental scanning    Consulted and Agree with Plan of Care  Patient       Patient will benefit from skilled therapeutic intervention in order to improve the following deficits and impairments:   Body Structure / Function / Physical Skills: ADL, Balance, Mobility, Strength, UE functional use, FMC, Coordination, Gait, Vision, ROM, GMC, Decreased knowledge of precautions, Decreased knowledge of use of DME, IADL, Dexterity, Sensation Cognitive Skills: Attention, Safety Awareness, Problem Solve     Visit Diagnosis: Hemiplegia and hemiparesis following cerebral infarction affecting left non-dominant side (HCC)  Muscle weakness (generalized)  Visuospatial deficit    Problem List Patient Active Problem List   Diagnosis Date Noted  . Acute bilateral deep vein thrombosis (DVT) of popliteal veins (HCC) 12/26/2018  . Labile blood glucose   . Acute deep vein thrombosis (DVT) of tibial vein of left lower extremity ([place])   . Neurogenic bladder   . Slow transit constipation   . Acute deep vein thrombosis (DVT) of left tibial vein (HCC)   . Acute left hemiparesis ([place]) 12/02/2018  . Hyperlipemia 12/02/2018  . Prediabetes 12/02/2018  . Morbid obesity ([place]) 12/02/2018  . Chronic back pain 12/02/2018  . Urinary retention 12/02/2018  . Acute ischemic right MCA  stroke ([place]) 12/02/2018  . Acute ischemic stroke ([place]) large R MCA and 2 L infarcts s/p tPA and mechanical thrombectomy 11/29/2018  . 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 S/P right knee arthroscopy 07/09/2017  . S/P knee surgery 01/10/2017     Carey Bullocks, OTR/L 03/26/2019, 3:28 PM  [facility] [address] [place], [place], [address] Phone: [phone]   Fax:  [phone]  Name: Shalinda Kuske MRN: [id number] Date of Birth: [birth year]

## 2019-03-27 ENCOUNTER — Telehealth: Payer: Self-pay | Admitting: Adult Health

## 2019-03-27 ENCOUNTER — Ambulatory Visit (HOSPITAL_COMMUNITY)
Admission: RE | Admit: 2019-03-27 | Discharge: 2019-03-27 | Disposition: A | Discharge status: Home / Self Care | Payer: Medicare Other | Source: Ambulatory Visit | Attending: Adult Health | Admitting: Adult Health

## 2019-03-27 DIAGNOSIS — I82442 Acute embolism and thrombosis of left tibial vein: Secondary | ICD-10-CM | POA: Diagnosis not present

## 2019-03-27 NOTE — Progress Notes (Signed)
Left lower extremity venous duplex completed. Refer to "CV Proc" under chart review to view preliminary results.  03/27/2019 9:35 AM Kelby Aline., MHA, RVT, RDCS, RDMS

## 2019-03-27 NOTE — Telephone Encounter (Signed)
Sharyn Lull from [facility] called wanting to inform provider that the pt is negative to DVT and that the pt is awaiting further instructions on wether she should continue her Eliquis. Please advise.

## 2019-03-29 NOTE — Therapy (Signed)
[facility] [address] [place], [place], [address] Phone: [phone]   Fax:  [phone]  Physical Therapy Treatment  Patient Details  Name: Deanna [name] MRN: [id number] Date of Birth: [birth year] Referring Provider (PT): Charlett Blake, MD   Encounter Date: 03/26/2019   03/26/19 1405  PT Visits / Re-Eval  Visit Number 5  Number of Visits 12  Date for PT Re-Evaluation 04/15/19  Authorization  Authorization Type M[employer]  PT Time Calculation  PT Start Time 1402  PT Stop Time 1444  PT Time Calculation (min) 42 min  PT - End of Session  Equipment Utilized During Treatment Gait belt  Activity Tolerance Patient tolerated treatment well  Behavior During Therapy [facility] for tasks assessed/performed     Past Medical History:  Diagnosis Date  . Arthritis    osteoarthritis in  both knees  . Asthma   . Difficult intravenous access   . GERD (gastroesophageal reflux disease)   . Heart murmur    born with years ago  . Pre-diabetes    last A1C=6.0 per pt  . Torn meniscus    left knee w fracture x2 to left knee  . Wears glasses     Past Surgical History:  Procedure Laterality Date  . BUBBLE STUDY  12/02/2018   Procedure: BUBBLE STUDY;  Surgeon: Elouise Munroe, MD;  Location: [place];  Service: Cardiology;;  . CHOLECYSTECTOMY    . CHONDROPLASTY Right 07/09/2017   Procedure: CHONDROPLASTY;  Surgeon: Sydnee Cabal, MD;  Location: [facility];  Service: Orthopedics;  Laterality: Right;  . HERNIA REPAIR Right age 11   inguinal  . IR CT HEAD LTD  11/29/2018  . IR PERCUTANEOUS ART THROMBECTOMY/INFUSION INTRACRANIAL INC DIAG ANGIO  11/29/2018  . KNEE ARTHROSCOPY WITH MEDIAL MENISECTOMY Left 01/10/2017   Procedure: LEFT KNEE ARTHROSCOPY, DEBRIDEMENT, PARTIAL MEDIAL MENISCECTOMY, CHONDROPLASTY, ARTHROSCOPIC ASSISTED INTERNAL FIXATION OF MEDIAL FEMORAL CONDYLE AND MEDIAL TIBIAL  PLATEAU INSUFFICIENCY FRACTURES;  Surgeon: Sydnee Cabal, MD;  Location: [place];  Service: Orthopedics;  Laterality: Left;  . KNEE ARTHROSCOPY WITH MEDIAL MENISECTOMY Right 07/09/2017   Procedure: Right knee scope, debridement, chondroplasty, partial meniscectomy, internal arthroscopic assisted internal fixation of medial tibial plateau fracture;  Surgeon: Sydnee Cabal, MD;  Location: [facility];  Service: Orthopedics;  Laterality: Right;  . LOOP RECORDER INSERTION N/A 12/02/2018   Procedure: LOOP RECORDER INSERTION;  Surgeon: Evans Lance, MD;  Location: [place] [facility];  Service: Cardiovascular;  Laterality: N/A;  . RADIOLOGY WITH ANESTHESIA N/A 11/29/2018   Procedure: RADIOLOGY WITH ANESTHESIA;  Surgeon: Luanne Bras, MD;  Location: [place];  Service: Radiology;  Laterality: N/A;  . TEE WITHOUT CARDIOVERSION N/A 12/02/2018   Procedure: TRANSESOPHAGEAL ECHOCARDIOGRAM (TEE);  Surgeon: Elouise Munroe, MD;  Location: [place];  Service: Cardiology;  Laterality: N/A;  . TUBAL LIGATION  age 5    There were no vitals filed for this visit.     03/26/19 1404  Symptoms/Limitations  Subjective No new complaints. No falls or pain to report. Saw Dr. Letta Pate this am who stated things are looking good. Has been using the small base quad cane at home with spouse in room, not next to her. Uses walker when home alone. Has a follow up dopplar study tomorrow to see if DVT has cleared (found on [facility]) and if she needs to stay on the Elliquis or return to just asprin.  Pertinent History CVA Rt MCA July  2020  Limitations Lifting;Standing;Walking;House hold activities  How long can you stand comfortably? 15 min if she has UE support  How long can you walk comfortably? limited community ambulator with RW, has not been to grocery store.  Patient Stated Goals work in the garden again.  Pain Assessment  Currently in Pain? No/denies  Pain Score 0       03/26/19 1407  Transfers  Transfers Sit to Stand;Stand to Sit  Sit to Stand 5: Supervision;With upper extremity assist;From chair/3-in-1;From bed  Stand to Sit 5: Supervision;With upper extremity assist;To bed;To chair/3-in-1  Ambulation/Gait  Ambulation/Gait Yes  Ambulation/Gait Assistance 4: Min guard;4: Min assist  Ambulation/Gait Assistance Details use of walker to enter gym from lobby. Use of small base quad cane in session with cues on step length, posture and cane placement.   Ambulation Distance (Feet) 40 Feet (x1, 20 x1, 30 x1)  Assistive device Rolling walker;Small based quad cane  Gait Pattern Step-through pattern;Decreased stride length;Decreased stance time - left;Decreased step length - right;Decreased weight shift to left;Antalgic;Trunk flexed;Narrow base of support  Ambulation Surface Level;Indoor  High Level Balance  High Level Balance Activities Side stepping;Marching forwards;Marching backwards  High Level Balance Comments on blue mat in parallel bars: 3 laps each with light UE support on bars, cues on posture and ex form/technique. min guard assist for balance.   Exercises  Exercises Other Exercises  Other Exercises  Nustep level 6 for 8 minutes with UE/LE with goal >/= 40 steps per minute for strengthening and activity tolerance.      03/26/19 1426  Balance Exercises: Standing  Standing Eyes Closed Narrow base of support (BOS);Wide (BOA);Head turns;Foam/compliant surface;Other reps (comment);30 secs;Limitations  Balance Beam standing across blue foam beam: fwd stepping to floor/back onto beam, then bwd stepping to floor/back onto beam for 8-10 reps each bil sides with light bil UE support on bars.   Balance Exercises: Standing  Standing Eyes Closed Limitations on airex with no UE support: wide base of support to narrow base of support for EC no head movement, then with feet hip width apart again for EC head movements left<>right, then up<>down. min guard to min assist  for balance with cues on posture and weight shifting to assist with balance recovery.        PT Long Term Goals - 03/05/19 [id number]      PT LONG TERM GOAL #1   Title  Pt will be I and compliant with HEP. (Target for all goals 6 weeks 04/15/19)    Status  New      PT LONG TERM GOAL #2   Title  Pt will improve TUG to less than 13 sec with LRAD to show improved gait and balance.    Baseline  18    Status  New      PT LONG TERM GOAL #3   Title  Pt will improve 5TSTS to less than 13 sec to show improved balance and LE strength    Baseline  15.2    Status  New      PT LONG TERM GOAL #4   Title  Pt will improve BERG balance test >47 to show improved balance and less need for AD.    Baseline  33    Status  New      PT LONG TERM GOAL #5   Title  Pt will be able to ambulate community distances at least 750 ft mod I with LRAD    Status  New  03/26/19 1406  Plan  Clinical Impression Statement Today's skilled session continued to focus on gait with small base quad cane, strengthening and balance reactions. The pt is making steady progress toward goals and should benefit from continued PT to progress toward unmet goals.  Personal Factors and Comorbidities Comorbidity 1  Comorbidities CVA, TKA  Examination-Activity Limitations Carry;Squat;Stairs;Lift;Stand;Locomotion Level;Transfers  Examination-Participation Restrictions Meal Prep;Cleaning;Community Activity;Driving;Laundry;Shop  Pt will benefit from skilled therapeutic intervention in order to improve on the following deficits Abnormal gait;Decreased balance;Decreased coordination;Decreased endurance;Decreased safety awareness;Decreased strength;Difficulty walking;Postural dysfunction;Obesity  Stability/Clinical Decision Making Evolving/Moderate complexity  Rehab Potential Good  PT Frequency 2x / week  PT Duration 6 weeks  PT Treatment/Interventions ADLs/Self Care Home Management;Aquatic Therapy;Electrical  Stimulation;Cryotherapy;Moist Heat;DME Instruction;Gait training;Stair training;Functional mobility training;Therapeutic activities;Therapeutic exercise;Balance training;Neuromuscular re-education;Manual techniques;Orthotic Fit/Training;Passive range of motion;Taping  PT Next Visit Plan continue to work on LE strengthening, balance reactions on compliant surfaces and gait with small base quad cane for short distances.  PT Home Exercise Plan Access Code: [email or web address] and added ankle 4 way with yellow and standing H/T raises.  Consulted and Agree with Plan of Care Patient          Patient will benefit from skilled therapeutic intervention in order to improve the following deficits and impairments:  Abnormal gait, Decreased balance, Decreased coordination, Decreased endurance, Decreased safety awareness, Decreased strength, Difficulty walking, Postural dysfunction, Obesity  Visit Diagnosis: Hemiplegia and hemiparesis following cerebral infarction affecting left non-dominant side (HCC)  Muscle weakness (generalized)  Other abnormalities of gait and mobility  Other lack of coordination     Problem List Patient Active Problem List   Diagnosis Date Noted  . Acute bilateral deep vein thrombosis (DVT) of popliteal veins (HCC) 12/26/2018  . Labile blood glucose   . Acute deep vein thrombosis (DVT) of tibial vein of left lower extremity ([place])   . Neurogenic bladder   . Slow transit constipation   . Acute deep vein thrombosis (DVT) of left tibial vein (HCC)   . Acute left hemiparesis ([place]) 12/02/2018  . Hyperlipemia 12/02/2018  . Prediabetes 12/02/2018  . Morbid obesity ([place]) 12/02/2018  . Chronic back pain 12/02/2018  . Urinary retention 12/02/2018  . Acute ischemic right MCA stroke ([place]) 12/02/2018  . Acute ischemic stroke ([place]) large R MCA and 2 L infarcts s/p tPA and mechanical thrombectomy 11/29/2018  . 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 S/P right knee arthroscopy  07/09/2017  . S/P knee surgery 01/10/2017    Willow Ora, PTA, [facility] [address], [place], [place] [address] [phone] 03/29/19, 6:29 PM   Name: Tejasvi Blatter MRN: [id number] Date of Birth: 08-27-1950

## 2019-03-31 NOTE — Telephone Encounter (Signed)
Spoke with patient and informed that her recent lower extremity ultrasound did not show residual clot. Per up-to-date recommendation, anticoagulation duration is at least 3 months. Currently she has been on anticoagulation for 4 months and with recent imaging showing resolution; she can discontinue her Eliquis and continue on aspirin alone. advised her there was evidence of a cystic structure behind her knee and may need further evaluation by her PCP. She stated she was on ASA 81 mg, so she will begin taking it again and stop Eliquis. She's had previous issues with her knee due to arthritis, has FU with PCP soon and wants to give her a copy. I advised when she comes for rehab she can sign a release and get a copy. Patient verbalized understanding, appreciation.

## 2019-04-01 ENCOUNTER — Other Ambulatory Visit: Payer: Self-pay

## 2019-04-01 ENCOUNTER — Ambulatory Visit: Payer: Medicare Other | Admitting: Occupational Therapy

## 2019-04-01 ENCOUNTER — Ambulatory Visit: Payer: Medicare Other

## 2019-04-01 DIAGNOSIS — R41842 Visuospatial deficit: Secondary | ICD-10-CM | POA: Diagnosis not present

## 2019-04-01 DIAGNOSIS — I69315 Cognitive social or emotional deficit following cerebral infarction: Secondary | ICD-10-CM | POA: Diagnosis not present

## 2019-04-01 DIAGNOSIS — M6281 Muscle weakness (generalized): Secondary | ICD-10-CM | POA: Diagnosis not present

## 2019-04-01 DIAGNOSIS — I69354 Hemiplegia and hemiparesis following cerebral infarction affecting left non-dominant side: Secondary | ICD-10-CM

## 2019-04-01 DIAGNOSIS — R2689 Other abnormalities of gait and mobility: Secondary | ICD-10-CM | POA: Diagnosis not present

## 2019-04-01 DIAGNOSIS — R278 Other lack of coordination: Secondary | ICD-10-CM | POA: Diagnosis not present

## 2019-04-01 NOTE — Therapy (Signed)
[facility] [address] [place], [place], [address] Phone: [phone]   Fax:  [phone]  Occupational Therapy Treatment  Patient Details  Name: Deanna [name] MRN: [id number] Date of Birth: 12-15-1950 Referring Provider (OT): Dr. Letta Pate   Encounter Date: 04/01/2019  OT End of Session - 04/01/19 1505    Visit Number  3    Number of Visits  24    Date for OT Re-Evaluation  06/04/19    Authorization Type  Medicare, [employer]    Authorization Time Period  may d/c earlier dependenting on progress    Authorization - Visit Number  4    Authorization - Number of Visits  10    OT Start Time  1402    OT Stop Time  1445    OT Time Calculation (min)  43 min    Activity Tolerance  Patient tolerated treatment well    Behavior During Therapy  [facility] for tasks assessed/performed       Past Medical History:  Diagnosis Date  . Arthritis    osteoarthritis in  both knees  . Asthma   . Difficult intravenous access   . GERD (gastroesophageal reflux disease)   . Heart murmur    born with years ago  . Pre-diabetes    last A1C=6.0 per pt  . Torn meniscus    left knee w fracture x2 to left knee  . Wears glasses     Past Surgical History:  Procedure Laterality Date  . BUBBLE STUDY  12/02/2018   Procedure: BUBBLE STUDY;  Surgeon: Elouise Munroe, MD;  Location: [place];  Service: Cardiology;;  . CHOLECYSTECTOMY    . CHONDROPLASTY Right 07/09/2017   Procedure: CHONDROPLASTY;  Surgeon: Sydnee Cabal, MD;  Location: [facility];  Service: Orthopedics;  Laterality: Right;  . HERNIA REPAIR Right age 89   inguinal  . IR CT HEAD LTD  11/29/2018  . IR PERCUTANEOUS ART THROMBECTOMY/INFUSION INTRACRANIAL INC DIAG ANGIO  11/29/2018  . KNEE ARTHROSCOPY WITH MEDIAL MENISECTOMY Left 01/10/2017   Procedure: LEFT KNEE ARTHROSCOPY, DEBRIDEMENT, PARTIAL MEDIAL MENISCECTOMY, CHONDROPLASTY, ARTHROSCOPIC ASSISTED  INTERNAL FIXATION OF MEDIAL FEMORAL CONDYLE AND MEDIAL TIBIAL PLATEAU INSUFFICIENCY FRACTURES;  Surgeon: Sydnee Cabal, MD;  Location: [place];  Service: Orthopedics;  Laterality: Left;  . KNEE ARTHROSCOPY WITH MEDIAL MENISECTOMY Right 07/09/2017   Procedure: Right knee scope, debridement, chondroplasty, partial meniscectomy, internal arthroscopic assisted internal fixation of medial tibial plateau fracture;  Surgeon: Sydnee Cabal, MD;  Location: [facility];  Service: Orthopedics;  Laterality: Right;  . LOOP RECORDER INSERTION N/A 12/02/2018   Procedure: LOOP RECORDER INSERTION;  Surgeon: Evans Lance, MD;  Location: [place] [facility];  Service: Cardiovascular;  Laterality: N/A;  . RADIOLOGY WITH ANESTHESIA N/A 11/29/2018   Procedure: RADIOLOGY WITH ANESTHESIA;  Surgeon: Luanne Bras, MD;  Location: [place];  Service: Radiology;  Laterality: N/A;  . TEE WITHOUT CARDIOVERSION N/A 12/02/2018   Procedure: TRANSESOPHAGEAL ECHOCARDIOGRAM (TEE);  Surgeon: Elouise Munroe, MD;  Location: [place];  Service: Cardiology;  Laterality: N/A;  . TUBAL LIGATION  age 57    There were no vitals filed for this visit.  Subjective Assessment - 04/01/19 1500    Subjective   I slept wrong last night and have a muscle pull in my back    Pertinent History  68 year old female with history of prediabetes, OA bilateral knees, obesity who was admitted on 11/29/2018 with left-sided tingling  and left lower extremity weakness with right gaze preference.  CTA head neck with perfusion showed proximal right M2 occlusion with associated right MCA infarct with penumbra and moderate cervical artery atherosclerosis.  She underwent cerebral angiogram with endovascular revascularization of right MCA  Follow-up MRI brain showed moderate acute right-MCA infarct with petechial hemorrhage in 1 of 2 acute left frontoparietal infarcts.    On 07/20, she had increasing left-sided weakness with  nausea and follow-up CT head showed large edematous infarct in right MCA with substantial increase in hypodensity and new hemorrhagic conversion in deep white matter of posterior frontal and temporal lobes    Patient Stated Goals  improve use of LUE    Currently in Pain?  Yes    Pain Score  2     Pain Location  Hip    Pain Orientation  Right    Pain Descriptors / Indicators  Tightness    Pain Onset  Today    Pain Frequency  Rarely    Pain Relieving Factors  light stretching    Multiple Pain Sites  No                   OT Treatments/Exercises (OP) - 04/01/19 0001      Transfers   Comments  ambulating with RW today due to number of appointments and lower back tightness, requires min A supine to SSP on mat      Visual/Perceptual Exercises   Other Exercises  visual scanning and design copy activities with occ cues      Neurological Re-education Exercises   Other Exercises 1  supine shoulder stretch and low back stretches with good results and decrease in pain as per patient report, completed scapular mobility exercises.  grasp release activities with trunk / cross body reach in stance, completed standing dynamic balance and stepping activities with ball bouncing/GMC activites - improved with repetition             OT Education - 04/01/19 1505    Education Details  OH stretch, light trunk/lower back stretches    Person(s) Educated  Patient    Methods  Explanation;Demonstration    Comprehension  Verbalized understanding;Returned demonstration       OT Short Term Goals - 03/26/19 1525      OT SHORT TERM GOAL #1   Title  I with HEP    Time  4    Period  Weeks    Status  On-going    Target Date  04/05/19      OT SHORT TERM GOAL #2   Title  Pt will demonstrate improved fine motor coordination for ADLs as evidenced by decreasing 9 hole peg test score to 50 secs or less.    Baseline  RUE 20.56, LUE 56.87    Time  4    Period  Weeks    Status  New      OT SHORT  TERM GOAL #3   Title  Pt will demonstrate ability to retrieve a lightweight object with LUE at 110*sh. flexion  for increased functional reach.    Baseline  L sh. flexion 105    Time  4    Period  Weeks    Status  New      OT SHORT TERM GOAL #4   Title  Pt will perform basic cooking with supervision and min v.c    Time  4    Period  Weeks    Status  New  OT SHORT TERM GOAL #5   Title  Pt will perform environmental scanning with 80% or better accuracy.    Time  4    Period  Weeks    Status  New        OT Long Term Goals - 03/06/19 1034      OT LONG TERM GOAL #1   Title  I with updated HEP.    Time  12    Period  Weeks    Status  New    Target Date  06/04/19      OT LONG TERM GOAL #2   Title  Pt will demonstrate improved fine motor coordination for IADLS  as evidenced  by decreasing 9 hole peg test score to 45 secs or less.    Baseline  LUE 56.87 secs    Time  12    Period  Weeks    Status  New      OT LONG TERM GOAL #3   Title  Pt will demonstrate ability to retrieve a 1 lbs object from overhead shelf at 115* with good control.    Baseline  L sh. flex 105    Time  12    Period  Weeks    Status  New      OT LONG TERM GOAL #4   Title  Pt will resume perfromance of mod complex cooking and home management modified independently.    Time  12    Period  Weeks    Status  New      OT LONG TERM GOAL #5   Title  Pt will perfrom environmental scanning in a busy environment with 90% or better accuracy.    Time  12    Period  Weeks    Status  New      Long Term Additional Goals   Additional Long Term Goals  Yes      OT LONG TERM GOAL #6   Title  Pt will demonstrate ability to perfrom a physical and cognitive task simultaneously with 90% or better accuracy in prep for return to driving.    Time  12    Period  Weeks    Status  New            Plan - 04/01/19 1506    Clinical Impression Statement  patient states that she is walking with NBQC more often at  home and progress noted with left UE functional use    Occupational performance deficits (Please refer to evaluation for details):  ADL's;IADL's;Leisure;Social Participation    Body Structure / Function / Physical Skills  ADL;Balance;Mobility;Strength;UE functional use;FMC;Coordination;Gait;Vision;ROM;GMC;Decreased knowledge of precautions;Decreased knowledge of use of DME;IADL;Dexterity;Sensation    Cognitive Skills  Attention;Safety Awareness;Problem Solve    Rehab Potential  Good    OT Frequency  2x / week    OT Treatment/Interventions  Self-care/ADL training;Therapeutic exercise;Balance training;Functional Mobility Training;Manual Therapy;Neuromuscular education;Ultrasound;Aquatic Therapy;Therapeutic activities;Cognitive remediation/compensation;DME and/or AE instruction;Paraffin;Cryotherapy;Fluidtherapy;Visual/perceptual remediation/compensation;Patient/family education;Passive range of motion;Moist Heat;Electrical Stimulation    Plan  continue LUE functional use, environmental scanning, monitor low back pain    Consulted and Agree with Plan of Care  Patient       Patient will benefit from skilled therapeutic intervention in order to improve the following deficits and impairments:   Body Structure / Function / Physical Skills: ADL, Balance, Mobility, Strength, UE functional use, FMC, Coordination, Gait, Vision, ROM, GMC, Decreased knowledge of precautions, Decreased knowledge of use of DME, IADL, Dexterity, Sensation Cognitive Skills: Attention,  Safety Awareness, Problem Solve     Visit Diagnosis: Hemiplegia and hemiparesis following cerebral infarction affecting left non-dominant side [facility])    Problem List Patient Active Problem List   Diagnosis Date Noted  . Acute bilateral deep vein thrombosis (DVT) of popliteal veins (HCC) 12/26/2018  . Labile blood glucose   . Acute deep vein thrombosis (DVT) of tibial vein of left lower extremity ([place])   . Neurogenic bladder   . Slow transit  constipation   . Acute deep vein thrombosis (DVT) of left tibial vein (HCC)   . Acute left hemiparesis ([place]) 12/02/2018  . Hyperlipemia 12/02/2018  . Prediabetes 12/02/2018  . Morbid obesity ([place]) 12/02/2018  . Chronic back pain 12/02/2018  . Urinary retention 12/02/2018  . Acute ischemic right MCA stroke ([place]) 12/02/2018  . Acute ischemic stroke ([place]) large R MCA and 2 L infarcts s/p tPA and mechanical thrombectomy 11/29/2018  . 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 S/P right knee arthroscopy 07/09/2017  . S/P knee surgery 01/10/2017    Carlos Levering, OTR/L 04/01/2019, 3:09 PM  [facility] [address] [place], [place], [address] Phone: [phone]   Fax:  [phone]  Name: Deanna [name] MRN: [id number] Date of Birth: 1950/05/23

## 2019-04-01 NOTE — Patient Instructions (Signed)
Access Code: [email or web address]  URL: [email or web address]  Date: 04/01/2019  Prepared by: Cherly Anderson   Exercises Seated Ankle Inversion with Resistance - 10 reps - 1-2 sets - 2x daily - 6x weekly Seated Ankle Dorsiflexion with Anchored Resistance - 10 reps - 1-2 sets - 2x daily - 6x weekly Seated Ankle Eversion with Resistance - 10 reps - 1-2 sets - 2x daily - 6x weekly Seated Eccentric Ankle Plantar Flexion with Resistance - Straight Leg - 10 reps - 1-2 sets - 2x daily - 6x weekly Heel Toe Raises with Counter Support - 10 reps - 2 sets - 2x daily - 6x weekly Walking March - 3 reps - 1 sets - 1x daily - 7x weekly Side Stepping with Counter Support - 3 reps - 1 sets - 1x daily - 7x weekly

## 2019-04-01 NOTE — Therapy (Signed)
[facility] [address] [place], [place], [address] Phone: [phone]   Fax:  [phone]  Physical Therapy Treatment  Patient Details  Name: Deanna [name] MRN: [id number] Date of Birth: 03-12-1951 Referring Provider (PT): Letta Pate Luanna Salk, MD   Encounter Date: 04/01/2019  PT End of Session - 04/01/19 1448    Visit Number  6    Number of Visits  12    Date for PT Re-Evaluation  04/15/19    Authorization Type  [employer]    PT Start Time  1444    PT Stop Time  1528    PT Time Calculation (min)  44 min    Equipment Utilized During Treatment  Gait belt    Activity Tolerance  Patient tolerated treatment well    Behavior During Therapy  [facility] for tasks assessed/performed       Past Medical History:  Diagnosis Date  . Arthritis    osteoarthritis in  both knees  . Asthma   . Difficult intravenous access   . GERD (gastroesophageal reflux disease)   . Heart murmur    born with years ago  . Pre-diabetes    last A1C=6.0 per pt  . Torn meniscus    left knee w fracture x2 to left knee  . Wears glasses     Past Surgical History:  Procedure Laterality Date  . BUBBLE STUDY  12/02/2018   Procedure: BUBBLE STUDY;  Surgeon: Elouise Munroe, MD;  Location: [place];  Service: Cardiology;;  . CHOLECYSTECTOMY    . CHONDROPLASTY Right 07/09/2017   Procedure: CHONDROPLASTY;  Surgeon: Sydnee Cabal, MD;  Location[facility];  Service: Orthopedics;  Laterality: Right;  . HERNIA REPAIR Right age 63   inguinal  . IR CT HEAD LTD  11/29/2018  . IR PERCUTANEOUS ART THROMBECTOMY/INFUSION INTRACRANIAL INC DIAG ANGIO  11/29/2018  . KNEE ARTHROSCOPY WITH MEDIAL MENISECTOMY Left 01/10/2017   Procedure: LEFT KNEE ARTHROSCOPY, DEBRIDEMENT, PARTIAL MEDIAL MENISCECTOMY, CHONDROPLASTY, ARTHROSCOPIC ASSISTED INTERNAL FIXATION OF MEDIAL FEMORAL CONDYLE AND MEDIAL TIBIAL PLATEAU INSUFFICIENCY  FRACTURES;  Surgeon: Sydnee Cabal, MD;  Location: [place];  Service: Orthopedics;  Laterality: Left;  . KNEE ARTHROSCOPY WITH MEDIAL MENISECTOMY Right 07/09/2017   Procedure: Right knee scope, debridement, chondroplasty, partial meniscectomy, internal arthroscopic assisted internal fixation of medial tibial plateau fracture;  Surgeon: Sydnee Cabal, MD;  Location: [facility];  Service: Orthopedics;  Laterality: Right;  . LOOP RECORDER INSERTION N/A 12/02/2018   Procedure: LOOP RECORDER INSERTION;  Surgeon: Evans Lance, MD;  Location: [facility];  Service: Cardiovascular;  Laterality: N/A;  . RADIOLOGY WITH ANESTHESIA N/A 11/29/2018   Procedure: RADIOLOGY WITH ANESTHESIA;  Surgeon: Luanne Bras, MD;  Location: [facility];  Service: Radiology;  Laterality: N/A;  . TEE WITHOUT CARDIOVERSION N/A 12/02/2018   Procedure: TRANSESOPHAGEAL ECHOCARDIOGRAM (TEE);  Surgeon: Elouise Munroe, MD;  Location: [facility];  Service: Cardiology;  Laterality: N/A;  . TUBAL LIGATION  age 59    There were no vitals filed for this visit.  Subjective Assessment - 04/01/19 1444    Subjective  Pt reports that she did speak with nurse and was told she could stop the eliquis and started 81mg  aspirin but she thought she only had a couple left and would finish out but realized she has more than she thought. She is going to talk to PCP on Monday to confirm. The doppler showed that the clot had resolved.  Pt reports that slept wrong last night and right low back was sore but doing some better now after OT session.    Pertinent History  CVA Rt MCA July 2020    Limitations  Lifting;Standing;Walking;House hold activities    How long can you stand comfortably?  15 min if she has UE support    How long can you walk comfortably?  limited community ambulator with RW, has not been to grocery store.    Patient Stated Goals  work in the garden again.    Currently in Pain?  Yes     Pain Score  0-No pain    Pain Location  Hip    Pain Orientation  Right                       [facility] Adult PT Treatment/Exercise - 04/01/19 1458      Ambulation/Gait   Ambulation/Gait  Yes    Ambulation/Gait Assistance  4: Min guard    Ambulation/Gait Assistance Details  verbal cues to try to increase left foot clearance.    Ambulation Distance (Feet)  200 Feet   60'x1   Assistive device  Small based quad cane    Gait Pattern  Step-to pattern;Step-through pattern;Decreased step length - right;Decreased step length - left    Ambulation Surface  Level;Indoor      Neuro Re-ed    Neuro Re-ed Details   Marching gait in // bars with 1 UE support x 4 laps, side stepping with 1 UE support  x 4 laps, reciprocal steps over 2 yard sticks and 2 2" foam beams x 4 laps with 1 UE support. Standing on airex with feet apart eyes open x 30 sec, eyes closed x 30 sec, standing with alternating shoulder flexion x 10, standing with feet together eyes open and eyes closed x 30 sec CGA for safety. PT did note left ankle supinating and suggested better sneaker to provide more ankle suppport.      Exercises   Exercises  Other Exercises    Other Exercises   Step-ups on 4" step with left LE x 10 forward and x 10 lateral. Cues to shift weight over left leg with rising and to not circumduct.             PT Education - 04/01/19 1538    Education Details  Adding walking march and sidestepping along counter to [employer]) Educated  Patient    Methods  Explanation    Comprehension  Verbalized understanding;Returned demonstration          PT Long Term Goals - 03/05/19 0738      PT LONG TERM GOAL #1   Title  Pt will be I and compliant with HEP. (Target for all goals 6 weeks 04/15/19)    Status  New      PT LONG TERM GOAL #2   Title  Pt will improve TUG to less than 13 sec with LRAD to show improved gait and balance.    Baseline  18    Status  New      PT LONG TERM GOAL #3   Title   Pt will improve 5TSTS to less than 13 sec to show improved balance and LE strength    Baseline  15.2    Status  New      PT LONG TERM GOAL #4   Title  Pt will improve BERG balance test >47 to show improved balance  and less need for AD.    Baseline  33    Status  New      PT LONG TERM GOAL #5   Title  Pt will be able to ambulate community distances at least 750 ft mod I with [place] - 04/01/19 1539    Clinical Impression Statement  Pt was able to increase gait distance with quad cane today. PT did note some instability at left ankle with supinating especially on compliant surfaces. Suggested better sneakers to provide more ankle support.    Personal Factors and Comorbidities  Comorbidity 1    Comorbidities  CVA, TKA    Examination-Activity Limitations  Carry;Squat;Stairs;Lift;Stand;Locomotion Level;Transfers    Examination-Participation Restrictions  Meal Prep;Cleaning;Community Activity;Driving;Laundry;Shop    Stability/Clinical Decision Making  Evolving/Moderate complexity    Rehab Potential  Good    PT Frequency  2x / week    PT Duration  6 weeks    PT Treatment/Interventions  ADLs/Self Care Home Management;Aquatic Therapy;Electrical Stimulation;Cryotherapy;Moist Heat;DME Instruction;Gait training;Stair training;Functional mobility training;Therapeutic activities;Therapeutic exercise;Balance training;Neuromuscular re-education;Manual techniques;Orthotic Fit/Training;Passive range of motion;Taping    PT Next Visit Plan  continue to work on LE strengthening, balance reactions on compliant surfaces and gait with small base quad cane for short distances. Add in more standing balance to HEP. Did patient try sneakers.    PT Home Exercise Plan  Access Code: [email or web address] and added ankle 4 way with yellow and standing H/T raises.    Consulted and Agree with Plan of Care  Patient       Patient will benefit from skilled therapeutic intervention in order to improve  the following deficits and impairments:  Abnormal gait, Decreased balance, Decreased coordination, Decreased endurance, Decreased safety awareness, Decreased strength, Difficulty walking, Postural dysfunction, Obesity  Visit Diagnosis: Muscle weakness (generalized)  Other abnormalities of gait and mobility     Problem List Patient Active Problem List   Diagnosis Date Noted  . Acute bilateral deep vein thrombosis (DVT) of popliteal veins (HCC) 12/26/2018  . Labile blood glucose   . Acute deep vein thrombosis (DVT) of tibial vein of left lower extremity ([place])   . Neurogenic bladder   . Slow transit constipation   . Acute deep vein thrombosis (DVT) of left tibial vein (HCC)   . Acute left hemiparesis ([place]) 12/02/2018  . Hyperlipemia 12/02/2018  . Prediabetes 12/02/2018  . Morbid obesity ([place]) 12/02/2018  . Chronic back pain 12/02/2018  . Urinary retention 12/02/2018  . Acute ischemic right MCA stroke ([place]) 12/02/2018  . Acute ischemic stroke ([place]) large R MCA and 2 L infarcts s/p tPA and mechanical thrombectomy 11/29/2018  . 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 S/P right knee arthroscopy 07/09/2017  . S/P knee surgery 01/10/2017    Electa Sniff, PT, DPT, NCS 04/01/2019, 3:43 PM  [facility] [address] [place], [place], [address] Phone: [phone]   Fax:  [phone]  Name: Deanna [name] MRN: [id number] Date of Birth: 1950-08-01

## 2019-04-03 ENCOUNTER — Ambulatory Visit: Payer: Medicare Other | Admitting: Physical Therapy

## 2019-04-03 ENCOUNTER — Other Ambulatory Visit: Payer: Self-pay

## 2019-04-03 ENCOUNTER — Ambulatory Visit: Payer: Medicare Other | Admitting: Occupational Therapy

## 2019-04-03 ENCOUNTER — Encounter: Payer: Self-pay | Admitting: Physical Therapy

## 2019-04-03 DIAGNOSIS — M6281 Muscle weakness (generalized): Secondary | ICD-10-CM | POA: Diagnosis not present

## 2019-04-03 DIAGNOSIS — I69354 Hemiplegia and hemiparesis following cerebral infarction affecting left non-dominant side: Secondary | ICD-10-CM | POA: Diagnosis not present

## 2019-04-03 DIAGNOSIS — R2689 Other abnormalities of gait and mobility: Secondary | ICD-10-CM

## 2019-04-03 DIAGNOSIS — R278 Other lack of coordination: Secondary | ICD-10-CM | POA: Diagnosis not present

## 2019-04-03 DIAGNOSIS — R41842 Visuospatial deficit: Secondary | ICD-10-CM | POA: Diagnosis not present

## 2019-04-03 DIAGNOSIS — I69315 Cognitive social or emotional deficit following cerebral infarction: Secondary | ICD-10-CM | POA: Diagnosis not present

## 2019-04-03 NOTE — Therapy (Signed)
[facility] [address] [place], [place], [address] Phone: [phone]   Fax:  [phone]  Occupational Therapy Treatment  Patient Details  Name: Deanna [name] MRN: [id number] Date of Birth: December 18, 1950 Referring Provider (OT): Dr. Letta Pate   Encounter Date: 04/03/2019  OT End of Session - 04/03/19 1450    Visit Number  5    Number of Visits  24    Date for OT Re-Evaluation  06/04/19    Authorization Type  Medicare, [employer]    Authorization Time Period  may d/c earlier dependenting on progress    Authorization - Visit Number  5    Authorization - Number of Visits  10    OT Start Time  1448    OT Stop Time  1528    OT Time Calculation (min)  40 min    Activity Tolerance  Patient tolerated treatment well    Behavior During Therapy  [facility] for tasks assessed/performed       Past Medical History:  Diagnosis Date  . Arthritis    osteoarthritis in  both knees  . Asthma   . Difficult intravenous access   . GERD (gastroesophageal reflux disease)   . Heart murmur    born with years ago  . Pre-diabetes    last A1C=6.0 per pt  . Torn meniscus    left knee w fracture x2 to left knee  . Wears glasses     Past Surgical History:  Procedure Laterality Date  . BUBBLE STUDY  12/02/2018   Procedure: BUBBLE STUDY;  Surgeon: Elouise Munroe, MD;  Location: [facility] [place];  Service: Cardiology;;  . CHOLECYSTECTOMY    . CHONDROPLASTY Right 07/09/2017   Procedure: CHONDROPLASTY;  Surgeon: Sydnee Cabal, MD;  Location: [facility];  Service: Orthopedics;  Laterality: Right;  . HERNIA REPAIR Right age 68   inguinal  . IR CT HEAD LTD  11/29/2018  . IR PERCUTANEOUS ART THROMBECTOMY/INFUSION INTRACRANIAL INC DIAG ANGIO  11/29/2018  . KNEE ARTHROSCOPY WITH MEDIAL MENISECTOMY Left 01/10/2017   Procedure: LEFT KNEE ARTHROSCOPY, DEBRIDEMENT, PARTIAL MEDIAL MENISCECTOMY, CHONDROPLASTY, ARTHROSCOPIC ASSISTED  INTERNAL FIXATION OF MEDIAL FEMORAL CONDYLE AND MEDIAL TIBIAL PLATEAU INSUFFICIENCY FRACTURES;  Surgeon: Sydnee Cabal, MD;  Location: [place];  Service: Orthopedics;  Laterality: Left;  . KNEE ARTHROSCOPY WITH MEDIAL MENISECTOMY Right 07/09/2017   Procedure: Right knee scope, debridement, chondroplasty, partial meniscectomy, internal arthroscopic assisted internal fixation of medial tibial plateau fracture;  Surgeon: Sydnee Cabal, MD;  Location: [facility];  Service: Orthopedics;  Laterality: Right;  . LOOP RECORDER INSERTION N/A 12/02/2018   Procedure: LOOP RECORDER INSERTION;  Surgeon: Evans Lance, MD;  Location: [place] [facility];  Service: Cardiovascular;  Laterality: N/A;  . RADIOLOGY WITH ANESTHESIA N/A 11/29/2018   Procedure: RADIOLOGY WITH ANESTHESIA;  Surgeon: Luanne Bras, MD;  Location: [facility];  Service: Radiology;  Laterality: N/A;  . TEE WITHOUT CARDIOVERSION N/A 12/02/2018   Procedure: TRANSESOPHAGEAL ECHOCARDIOGRAM (TEE);  Surgeon: Elouise Munroe, MD;  Location: [place];  Service: Cardiology;  Laterality: N/A;  . TUBAL LIGATION  age 68    There were no vitals filed for this visit.  Subjective Assessment - 04/03/19 1450    Currently in Pain?  No/denies    Pain Onset  In the past 7 days             Treatment: Supine cane exercises for chest press and shoulder flexion, min-mod facilitation at shoulder  and scapula for proper positioning,  Pt continues to demonstrate decreased awareness of LUE position.Seated closed chain shoulder flexion to 90, min-mod v.c for positioning Using medium pegs to copy peg design for LUE, increased time and mod difficulty/ v.c for correct design due to visual perceptual deficits, and decreased coordination. Removing pegs with in hand manipulation, min-mod v.c                  OT Short Term Goals - 04/03/19 1452      OT SHORT TERM GOAL #1   Title  I with HEP    Time  4     Period  Weeks    Status  Achieved    Target Date  04/05/19      OT SHORT TERM GOAL #2   Title  Pt will demonstrate improved fine motor coordination for ADLs as evidenced by decreasing 9 hole peg test score to 50 secs or less.    Baseline  RUE 20.56, LUE 56.87    Time  4    Period  Weeks    Status  On-going      OT SHORT TERM GOAL #3   Title  Pt will demonstrate ability to retrieve a lightweight object with LUE at 110*sh. flexion  for increased functional reach.    Baseline  L sh. flexion 105    Time  4    Period  Weeks    Status  New      OT SHORT TERM GOAL #4   Title  Pt will perform basic cooking with supervision and min v.c    Time  4    Period  Weeks    Status  New      OT SHORT TERM GOAL #5   Title  Pt will perform environmental scanning with 80% or better accuracy.    Time  4    Period  Weeks    Status  New        OT Long Term Goals - 03/06/19 1034      OT LONG TERM GOAL #1   Title  I with updated HEP.    Time  12    Period  Weeks    Status  New    Target Date  06/04/19      OT LONG TERM GOAL #2   Title  Pt will demonstrate improved fine motor coordination for IADLS  as evidenced  by decreasing 9 hole peg test score to 45 secs or less.    Baseline  LUE 56.87 secs    Time  12    Period  Weeks    Status  New      OT LONG TERM GOAL #3   Title  Pt will demonstrate ability to retrieve a 1 lbs object from overhead shelf at 115* with good control.    Baseline  L sh. flex 105    Time  12    Period  Weeks    Status  New      OT LONG TERM GOAL #4   Title  Pt will resume perfromance of mod complex cooking and home management modified independently.    Time  12    Period  Weeks    Status  New      OT LONG TERM GOAL #5   Title  Pt will perfrom environmental scanning in a busy environment with 90% or better accuracy.    Time  12    Period  Weeks  Status  New      Long Term Additional Goals   Additional Long Term Goals  Yes      OT LONG TERM GOAL #6    Title  Pt will demonstrate ability to perfrom a physical and cognitive task simultaneously with 90% or better accuracy in prep for return to driving.    Time  12    Period  Weeks    Status  New            Plan - 04/03/19 1505    Clinical Impression Statement  Pt is progressing towards goals. She continues to demonstrate decreased body awareness particularly in LUE    Occupational performance deficits (Please refer to evaluation for details):  ADL's;IADL's;Leisure;Social Participation    Body Structure / Function / Physical Skills  ADL;Balance;Mobility;Strength;UE functional use;FMC;Coordination;Gait;Vision;ROM;GMC;Decreased knowledge of precautions;Decreased knowledge of use of DME;IADL;Dexterity;Sensation    Cognitive Skills  Attention;Safety Awareness;Problem Solve    Rehab Potential  Good    OT Frequency  2x / week    OT Treatment/Interventions  Self-care/ADL training;Therapeutic exercise;Balance training;Functional Mobility Training;Manual Therapy;Neuromuscular education;Ultrasound;Aquatic Therapy;Therapeutic activities;Cognitive remediation/compensation;DME and/or AE instruction;Paraffin;Cryotherapy;Fluidtherapy;Visual/perceptual remediation/compensation;Patient/family education;Passive range of motion;Moist Heat;Electrical Stimulation    Plan  continue LUE functional use, environmental scanning, monitor low back pain    Consulted and Agree with Plan of Care  Patient       Patient will benefit from skilled therapeutic intervention in order to improve the following deficits and impairments:   Body Structure / Function / Physical Skills: ADL, Balance, Mobility, Strength, UE functional use, FMC, Coordination, Gait, Vision, ROM, GMC, Decreased knowledge of precautions, Decreased knowledge of use of DME, IADL, Dexterity, Sensation Cognitive Skills: Attention, Safety Awareness, Problem Solve     Visit Diagnosis: Hemiplegia and hemiparesis following cerebral infarction affecting left  non-dominant side (HCC)  Muscle weakness (generalized)  Other abnormalities of gait and mobility  Visuospatial deficit  Other lack of coordination    Problem List Patient Active Problem List   Diagnosis Date Noted  . Acute bilateral deep vein thrombosis (DVT) of popliteal veins (HCC) 12/26/2018  . Labile blood glucose   . Acute deep vein thrombosis (DVT) of tibial vein of left lower extremity ([place])   . Neurogenic bladder   . Slow transit constipation   . Acute deep vein thrombosis (DVT) of left tibial vein (HCC)   . Acute left hemiparesis ([place]) 12/02/2018  . Hyperlipemia 12/02/2018  . Prediabetes 12/02/2018  . Morbid obesity ([place]) 12/02/2018  . Chronic back pain 12/02/2018  . Urinary retention 12/02/2018  . Acute ischemic right MCA stroke ([place]) 12/02/2018  . Acute ischemic stroke ([place]) large R MCA and 2 L infarcts s/p tPA and mechanical thrombectomy 11/29/2018  . 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 S/P right knee arthroscopy 07/09/2017  . S/P knee surgery 01/10/2017    Cydne Grahn 04/03/2019, 3:18 PM  [facility] [address] [place], [place], [address] Phone: [phone]   Fax:  [phone]  Name: Jamyiah Veenstra MRN: [id number] Date of Birth: June 06, 1950

## 2019-04-05 NOTE — Therapy (Signed)
[facility] [address] [place], [place], [address] Phone: [phone]   Fax:  [phone]  Physical Therapy Treatment  Patient Details  Name: Arla Boutwell MRN: [id number] Date of Birth: 12-22-1950 Referring Provider (PT): Charlett Blake, MD   Encounter Date: 04/03/2019    04/03/19 1407  PT Visits / Re-Eval  Visit Number 7  Number of Visits 12  Date for PT Re-Evaluation 04/15/19  Authorization  Authorization Type M[employer]  PT Time Calculation  PT Start Time 1402  PT Stop Time 1443  PT Time Calculation (min) 41 min  PT - End of Session  Equipment Utilized During Treatment Gait belt  Activity Tolerance Patient tolerated treatment well  Behavior During Therapy [facility] for tasks assessed/performed     Past Medical History:  Diagnosis Date  . Arthritis    osteoarthritis in  both knees  . Asthma   . Difficult intravenous access   . GERD (gastroesophageal reflux disease)   . Heart murmur    born with years ago  . Pre-diabetes    last A1C=6.0 per pt  . Torn meniscus    left knee w fracture x2 to left knee  . Wears glasses     Past Surgical History:  Procedure Laterality Date  . BUBBLE STUDY  12/02/2018   Procedure: BUBBLE STUDY;  Surgeon: Elouise Munroe, MD;  Location: [facility];  Service: Cardiology;;  . CHOLECYSTECTOMY    . CHONDROPLASTY Right 07/09/2017   Procedure: CHONDROPLASTY;  Surgeon: Sydnee Cabal, MD;  Location: [facility];  Service: Orthopedics;  Laterality: Right;  . HERNIA REPAIR Right age 54   inguinal  . IR CT HEAD LTD  11/29/2018  . IR PERCUTANEOUS ART THROMBECTOMY/INFUSION INTRACRANIAL INC DIAG ANGIO  11/29/2018  . KNEE ARTHROSCOPY WITH MEDIAL MENISECTOMY Left 01/10/2017   Procedure: LEFT KNEE ARTHROSCOPY, DEBRIDEMENT, PARTIAL MEDIAL MENISCECTOMY, CHONDROPLASTY, ARTHROSCOPIC ASSISTED INTERNAL FIXATION OF MEDIAL FEMORAL CONDYLE AND MEDIAL  TIBIAL PLATEAU INSUFFICIENCY FRACTURES;  Surgeon: Sydnee Cabal, MD;  Location: [facility];  Service: Orthopedics;  Laterality: Left;  . KNEE ARTHROSCOPY WITH MEDIAL MENISECTOMY Right 07/09/2017   Procedure: Right knee scope, debridement, chondroplasty, partial meniscectomy, internal arthroscopic assisted internal fixation of medial tibial plateau fracture;  Surgeon: Sydnee Cabal, MD;  Location: [facility];  Service: Orthopedics;  Laterality: Right;  . LOOP RECORDER INSERTION N/A 12/02/2018   Procedure: LOOP RECORDER INSERTION;  Surgeon: Evans Lance, MD;  Location: [facility];  Service: Cardiovascular;  Laterality: N/A;  . RADIOLOGY WITH ANESTHESIA N/A 11/29/2018   Procedure: RADIOLOGY WITH ANESTHESIA;  Surgeon: Luanne Bras, MD;  Location: [facility];  Service: Radiology;  Laterality: N/A;  . TEE WITHOUT CARDIOVERSION N/A 12/02/2018   Procedure: TRANSESOPHAGEAL ECHOCARDIOGRAM (TEE);  Surgeon: Elouise Munroe, MD;  Location: [facility];  Service: Cardiology;  Laterality: N/A;  . TUBAL LIGATION  age 24    There were no vitals filed for this visit.     04/03/19 1404  Symptoms/Limitations  Subjective Has an phone appt with the PCP on Monday to discuss the Eliquis. Will be taking it till then. No falls. Right hip/lower back still "twiges" some from sleeping in poor position.  Pertinent History CVA Rt MCA July 2020  Limitations Lifting;Standing;Walking;House hold activities  How long can you stand comfortably? 15 min if she has UE support  How long can you walk comfortably? limited community ambulator with RW, has not been to grocery store.  Patient  Stated Goals work in the garden again.  Pain Assessment  Currently in Pain? Yes  Pain Score 1  Pain Location Hip  Pain Orientation Right  Pain Descriptors / Indicators Sore  Pain Type Acute pain  Pain Onset In the past 7 days  Pain Frequency Rarely  Aggravating Factors  certain movemens  Pain  Relieving Factors light stretching      04/03/19 1433  Ambulation/Gait  Ambulation/Gait Yes  Ambulation/Gait Assistance 5: Supervision  Ambulation/Gait Assistance Details cues for upright posture and to stay closer to walker with gait.   Ambulation Distance (Feet) 530 Feet (x1, plus around gym)  Assistive device Rolling walker  Gait Pattern Step-through pattern;Decreased stride length;Trunk flexed  Ambulation Surface Level;Indoor     04/03/19 1409  Standardized Balance Assessment  Standardized Balance Assessment TUG;Berg Balance Test;Five Times Sit to Stand  Five times sit to stand comments  10.22 sec's with no UE support using standard height chair  Berg Balance Test  Sit to Stand 4  Standing Unsupported 4  Sitting with Back Unsupported but Feet Supported on Floor or Stool 4  Stand to Sit 4  Transfers 4  Standing Unsupported with Eyes Closed 4  Standing Unsupported with Feet Together 4  From Standing, Reach Forward with Outstretched Arm 3 (8.5 inches)  From Standing Position, Pick up Object from Floor 3  From Standing Position, Turn to Look Behind Over each Shoulder 4  Turn 360 Degrees 2 (>8 sec's )  Standing Unsupported, Alternately Place Feet on Step/Stool 1  Standing Unsupported, One Foot in Front 2  Standing on One Leg 1  Total Score 44  Berg comment: 44/56= significant risk for falls  Timed Up and Go Test  TUG Normal TUG  Normal TUG (seconds) 14.65 (with RW; 29.47 sec's with small base quad cane)       PT Long Term Goals - 04/03/19 1407      PT LONG TERM GOAL #1   Title  Pt will be I and compliant with HEP. (Target for all goals 6 weeks 04/15/19)    Baseline  04/03/19: met with current program. Will need to continue to progress this program as she progressing in therapy.    Status  Achieved      PT LONG TERM GOAL #2   Title  Pt will improve TUG to less than 13 sec with LRAD to show improved gait and balance.    Baseline  18    Status  New      PT LONG  TERM GOAL #3   Title  Pt will improve 5TSTS to less than 13 sec to show improved balance and LE strength    Baseline  15.2    Status  New      PT LONG TERM GOAL #4   Title  Pt will improve BERG balance test >47 to show improved balance and less need for AD.    Baseline  33    Status  New      PT LONG TERM GOAL #5   Title  Pt will be able to ambulate community distances at least 750 ft mod I with LRAD    Status  New         04/03/19 1407  Plan  Clinical Impression Statement Today's skilled session focused on progress toward goals to meet Medicare requirements. The pt improved her Berg Balance Test score to 44/56 (from 33/56), Timed up and go now 14.65 sec's with RW, 29.47 sec's with small based  quad cane (was 18 sec's with RW) and 5 time sit to stand now 10.22 sec's no UE support (was 15 sec's no UE support). The pt is also progressing off the RW to use of small based quad cane. The pt is making steady progress and should benefit from continued PT to progress toward unmet goals  Personal Factors and Comorbidities Comorbidity 1  Comorbidities CVA, TKA  Examination-Activity Limitations Carry;Squat;Stairs;Lift;Stand;Locomotion Level;Transfers  Examination-Participation Restrictions Meal Prep;Cleaning;Community Activity;Driving;Laundry;Shop  Pt will benefit from skilled therapeutic intervention in order to improve on the following deficits Abnormal gait;Decreased balance;Decreased coordination;Decreased endurance;Decreased safety awareness;Decreased strength;Difficulty walking;Postural dysfunction;Obesity  Stability/Clinical Decision Making Evolving/Moderate complexity  Rehab Potential Good  PT Frequency 2x / week  PT Duration 6 weeks  PT Treatment/Interventions ADLs/Self Care Home Management;Aquatic Therapy;Electrical Stimulation;Cryotherapy;Moist Heat;DME Instruction;Gait training;Stair training;Functional mobility training;Therapeutic activities;Therapeutic exercise;Balance  training;Neuromuscular re-education;Manual techniques;Orthotic Fit/Training;Passive range of motion;Taping  PT Next Visit Plan continue to work on LE strengthening, balance reactions on compliant surfaces and gait with small base quad cane for short distances. Add in more standing balance to HEP. Did patient try sneakers.  PT Home Exercise Plan Access Code: [email or web address] and added ankle 4 way with yellow and standing H/T raises.  Consulted and Agree with Plan of Care Patient          Patient will benefit from skilled therapeutic intervention in order to improve the following deficits and impairments:  Abnormal gait, Decreased balance, Decreased coordination, Decreased endurance, Decreased safety awareness, Decreased strength, Difficulty walking, Postural dysfunction, Obesity  Visit Diagnosis: Hemiplegia and hemiparesis following cerebral infarction affecting left non-dominant side (HCC)  Muscle weakness (generalized)  Other abnormalities of gait and mobility     Problem List Patient Active Problem List   Diagnosis Date Noted  . Acute bilateral deep vein thrombosis (DVT) of popliteal veins (HCC) 12/26/2018  . Labile blood glucose   . Acute deep vein thrombosis (DVT) of tibial vein of left lower extremity ([place])   . Neurogenic bladder   . Slow transit constipation   . Acute deep vein thrombosis (DVT) of left tibial vein (HCC)   . Acute left hemiparesis ([place]) 12/02/2018  . Hyperlipemia 12/02/2018  . Prediabetes 12/02/2018  . Morbid obesity ([place]) 12/02/2018  . Chronic back pain 12/02/2018  . Urinary retention 12/02/2018  . Acute ischemic right MCA stroke ([place]) 12/02/2018  . Acute ischemic stroke ([place]) large R MCA and 2 L infarcts s/p tPA and mechanical thrombectomy 11/29/2018  . 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 S/P right knee arthroscopy 07/09/2017  . S/P knee surgery 01/10/2017    Willow Ora, PTA, [facility] [address], [place], [place] [address] [phone] 04/05/19, 1:15 PM   Name: Lorilyn Laitinen MRN: [id number] Date of Birth: 09-11-1950

## 2019-04-06 DIAGNOSIS — I82432 Acute embolism and thrombosis of left popliteal vein: Secondary | ICD-10-CM | POA: Diagnosis not present

## 2019-04-06 DIAGNOSIS — F419 Anxiety disorder, unspecified: Secondary | ICD-10-CM | POA: Diagnosis not present

## 2019-04-08 ENCOUNTER — Ambulatory Visit: Payer: Medicare Other | Admitting: Physical Therapy

## 2019-04-08 ENCOUNTER — Other Ambulatory Visit: Payer: Self-pay

## 2019-04-08 ENCOUNTER — Ambulatory Visit: Payer: Medicare Other | Admitting: Occupational Therapy

## 2019-04-08 ENCOUNTER — Encounter: Payer: Self-pay | Admitting: Occupational Therapy

## 2019-04-08 DIAGNOSIS — I69315 Cognitive social or emotional deficit following cerebral infarction: Secondary | ICD-10-CM | POA: Diagnosis not present

## 2019-04-08 DIAGNOSIS — M6281 Muscle weakness (generalized): Secondary | ICD-10-CM | POA: Diagnosis not present

## 2019-04-08 DIAGNOSIS — R41842 Visuospatial deficit: Secondary | ICD-10-CM

## 2019-04-08 DIAGNOSIS — R278 Other lack of coordination: Secondary | ICD-10-CM

## 2019-04-08 DIAGNOSIS — I69354 Hemiplegia and hemiparesis following cerebral infarction affecting left non-dominant side: Secondary | ICD-10-CM | POA: Diagnosis not present

## 2019-04-08 DIAGNOSIS — R2689 Other abnormalities of gait and mobility: Secondary | ICD-10-CM

## 2019-04-08 NOTE — Progress Notes (Signed)
[facility] Summary Report / Loop Recorder 

## 2019-04-08 NOTE — Therapy (Signed)
[facility] [address] [place], [place], [address] Phone: [phone]   Fax:  [phone]  Occupational Therapy Treatment  Patient Details  Name: Deanna [name] MRN: [id number] Date of Birth: [birth year] Referring Provider (OT): Dr. Letta Pate   Encounter Date: 04/08/2019  OT End of Session - 04/08/19 1323    Visit Number  6    Number of Visits  24    Date for OT Re-Evaluation  06/04/19    Authorization Type  Medicare, [employer]    Authorization Time Period  may d/c earlier dependenting on progress    Authorization - Visit Number  6    Authorization - Number of Visits  10    OT Start Time  1321    OT Stop Time  1400    OT Time Calculation (min)  39 min    Activity Tolerance  Patient tolerated treatment well    Behavior During Therapy  [facility] for tasks assessed/performed       Past Medical History:  Diagnosis Date  . Arthritis    osteoarthritis in  both knees  . Asthma   . Difficult intravenous access   . GERD (gastroesophageal reflux disease)   . Heart murmur    born with years ago  . Pre-diabetes    last A1C=6.0 per pt  . Torn meniscus    left knee w fracture x2 to left knee  . Wears glasses     Past Surgical History:  Procedure Laterality Date  . BUBBLE STUDY  12/02/2018   Procedure: BUBBLE STUDY;  Surgeon: Elouise Munroe, MD;  Location: [facility];  Service: Cardiology;;  . CHOLECYSTECTOMY    . CHONDROPLASTY Right 07/09/2017   Procedure: CHONDROPLASTY;  Surgeon: Sydnee Cabal, MD;  Location: [facility];  Service: Orthopedics;  Laterality: Right;  . HERNIA REPAIR Right age 39   inguinal  . IR CT HEAD LTD  11/29/2018  . IR PERCUTANEOUS ART THROMBECTOMY/INFUSION INTRACRANIAL INC DIAG ANGIO  11/29/2018  . KNEE ARTHROSCOPY WITH MEDIAL MENISECTOMY Left 01/10/2017   Procedure: LEFT KNEE ARTHROSCOPY, DEBRIDEMENT, PARTIAL MEDIAL MENISCECTOMY, CHONDROPLASTY, ARTHROSCOPIC ASSISTED  INTERNAL FIXATION OF MEDIAL FEMORAL CONDYLE AND MEDIAL TIBIAL PLATEAU INSUFFICIENCY FRACTURES;  Surgeon: Sydnee Cabal, MD;  Location: [facility];  Service: Orthopedics;  Laterality: Left;  . KNEE ARTHROSCOPY WITH MEDIAL MENISECTOMY Right 07/09/2017   Procedure: Right knee scope, debridement, chondroplasty, partial meniscectomy, internal arthroscopic assisted internal fixation of medial tibial plateau fracture;  Surgeon: Sydnee Cabal, MD;  Location: [facility];  Service: Orthopedics;  Laterality: Right;  . LOOP RECORDER INSERTION N/A 12/02/2018   Procedure: LOOP RECORDER INSERTION;  Surgeon: Evans Lance, MD;  Location: [facility];  Service: Cardiovascular;  Laterality: N/A;  . RADIOLOGY WITH ANESTHESIA N/A 11/29/2018   Procedure: RADIOLOGY WITH ANESTHESIA;  Surgeon: Luanne Bras, MD;  Location: [facility];  Service: Radiology;  Laterality: N/A;  . TEE WITHOUT CARDIOVERSION N/A 12/02/2018   Procedure: TRANSESOPHAGEAL ECHOCARDIOGRAM (TEE);  Surgeon: Elouise Munroe, MD;  Location: [facility];  Service: Cardiology;  Laterality: N/A;  . TUBAL LIGATION  age 23    There were no vitals filed for this visit.  Subjective Assessment - 04/08/19 1323    Subjective   Pt reports back is better.  Pt reports that she is been doing a little cooking.  Pt reports that she is going to mix up sweet potatoes, has washed a few dishes.    Pertinent History  68 year old female with history of prediabetes, OA bilateral knees, obesity who was admitted on 11/29/2018 with left-sided tingling and left lower extremity weakness with right gaze preference.  CTA head neck with perfusion showed proximal right M2 occlusion with associated right MCA infarct with penumbra and moderate cervical artery atherosclerosis.  She underwent cerebral angiogram with endovascular revascularization of right MCA  Follow-up MRI brain showed moderate acute right-MCA infarct with petechial hemorrhage in  1 of 2 acute left frontoparietal infarcts.    On 07/20, she had increasing left-sided weakness with nausea and follow-up CT head showed large edematous infarct in right MCA with substantial increase in hypodensity and new hemorrhagic conversion in deep white matter of posterior frontal and temporal lobes    Patient Stated Goals  improve use of LUE    Currently in Pain?  No/denies    Pain Onset  Today       Began checking STGs and discussing progress--see goal section below for status.    Sitting, attempted mid-high level reaching to place small pegs in pegboard, but only put in approx 12 in vertical pegboard due to difficulty with coordination and fatigue with incr compensation for reach; therefore, modified to copying design on table with min difficulty with coordination and mod cueing for copying design, pt with incr difficulty with alternating attention (with conversation), but even without alternating attention, pt missed most of far L side of design.    Environmental scanning in minimally distracting environment with 100% accuracy today.      OT Short Term Goals - 04/08/19 1324      OT SHORT TERM GOAL #1   Title  I with HEP    Time  4    Period  Weeks    Status  Achieved    Target Date  04/05/19      OT SHORT TERM GOAL #2   Title  Pt will demonstrate improved fine motor coordination for ADLs as evidenced by decreasing 9 hole peg test score to 50 secs or less.    Baseline  RUE 20.56, LUE 56.87    Time  4    Period  Weeks    Status  Achieved   04/08/19:  37.81sec     OT SHORT TERM GOAL #3   Title  Pt will demonstrate ability to retrieve a lightweight object with LUE at 110*sh. flexion  for increased functional reach.    Baseline  L sh. flexion 105    Time  4    Period  Weeks    Status  Achieved   04/08/19:  145*     OT SHORT TERM GOAL #4   Title  Pt will perform basic cooking with supervision and min v.c    Time  4    Period  Weeks    Status  On-going   Pt reports  04/08/19:  pt doing simple tasks (mixing, stirring, a little cutting)     OT SHORT TERM GOAL #5   Title  Pt will perform environmental scanning with 80% or better accuracy.    Time  4    Period  Weeks    Status  Achieved   04/08/19:  Simple environmental scanning in min distracting environment with 100% accuracy       OT Long Term Goals - 03/06/19 1034      OT LONG TERM GOAL #1   Title  I with updated HEP.    Time  12    Period  Weeks  Status  New    Target Date  06/04/19      OT LONG TERM GOAL #2   Title  Pt will demonstrate improved fine motor coordination for IADLS  as evidenced  by decreasing 9 hole peg test score to 45 secs or less.    Baseline  LUE 56.87 secs    Time  12    Period  Weeks    Status  New      OT LONG TERM GOAL #3   Title  Pt will demonstrate ability to retrieve a 1 lbs object from overhead shelf at 115* with good control.    Baseline  L sh. flex 105    Time  12    Period  Weeks    Status  New      OT LONG TERM GOAL #4   Title  Pt will resume perfromance of mod complex cooking and home management modified independently.    Time  12    Period  Weeks    Status  New      OT LONG TERM GOAL #5   Title  Pt will perfrom environmental scanning in a busy environment with 90% or better accuracy.    Time  12    Period  Weeks    Status  New      Long Term Additional Goals   Additional Long Term Goals  Yes      OT LONG TERM GOAL #6   Title  Pt will demonstrate ability to perfrom a physical and cognitive task simultaneously with 90% or better accuracy in prep for return to driving.    Time  12    Period  Weeks    Status  New            Plan - 04/08/19 1335    Clinical Impression Statement  Pt is progressing towards goals with improving L shoulder ROM, coordination, and environmental scanning.   4/5 STGs met.    Occupational performance deficits (Please refer to evaluation for details):  ADL's;IADL's;Leisure;Social Participation    Body  Structure / Function / Physical Skills  ADL;Balance;Mobility;Strength;UE functional use;FMC;Coordination;Gait;Vision;ROM;GMC;Decreased knowledge of precautions;Decreased knowledge of use of DME;IADL;Dexterity;Sensation    Cognitive Skills  Attention;Safety Awareness;Problem Solve    Rehab Potential  Good    OT Frequency  2x / week    OT Treatment/Interventions  Self-care/ADL training;Therapeutic exercise;Balance training;Functional Mobility Training;Manual Therapy;Neuromuscular education;Ultrasound;Aquatic Therapy;Therapeutic activities;Cognitive remediation/compensation;DME and/or AE instruction;Paraffin;Cryotherapy;Fluidtherapy;Visual/perceptual remediation/compensation;Patient/family education;Passive range of motion;Moist Heat;Electrical Stimulation    Plan  continue with LUE functional use, coordination, visual scanning    Consulted and Agree with Plan of Care  Patient       Patient will benefit from skilled therapeutic intervention in order to improve the following deficits and impairments:   Body Structure / Function / Physical Skills: ADL, Balance, Mobility, Strength, UE functional use, FMC, Coordination, Gait, Vision, ROM, GMC, Decreased knowledge of precautions, Decreased knowledge of use of DME, IADL, Dexterity, Sensation Cognitive Skills: Attention, Safety Awareness, Problem Solve     Visit Diagnosis: 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 Acute bilateral deep vein thrombosis (DVT) of popliteal veins (HCC) 12/26/2018  . Labile blood glucose   . Acute deep vein thrombosis (DVT) of tibial vein of left lower extremity (  HCC)   . Neurogenic bladder   . Slow transit constipation   . Acute deep vein thrombosis (DVT) of left  tibial vein (HCC)   . Acute left hemiparesis ([place]) 12/02/2018  . Hyperlipemia 12/02/2018  . Prediabetes 12/02/2018  . Morbid obesity ([place]) 12/02/2018  . Chronic back pain 12/02/2018  . Urinary retention 12/02/2018  . Acute ischemic right MCA stroke ([place]) 12/02/2018  . Acute ischemic stroke ([place]) large R MCA and 2 L infarcts s/p tPA and mechanical thrombectomy 11/29/2018  . 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 S/P right knee arthroscopy 07/09/2017  . S/P knee surgery 01/10/2017    [facility] 04/08/2019, 3:03 PM  [facility] [address] [place], [place], [address] Phone: [phone]   Fax:  [phone]  Name: Deanna [name] MRN: [id number] Date of Birth: [birth year]   Vianne Bulls, OTR/L [facility] [address] [place], [place]  [phone] phone [phone] 04/08/19 3:03 PM

## 2019-04-15 ENCOUNTER — Ambulatory Visit: Payer: Medicare Other

## 2019-04-15 ENCOUNTER — Other Ambulatory Visit: Payer: Self-pay

## 2019-04-15 ENCOUNTER — Encounter: Payer: Self-pay | Admitting: Occupational Therapy

## 2019-04-15 ENCOUNTER — Ambulatory Visit: Payer: Medicare Other | Attending: Physical Medicine & Rehabilitation | Admitting: Occupational Therapy

## 2019-04-15 DIAGNOSIS — M6281 Muscle weakness (generalized): Secondary | ICD-10-CM | POA: Insufficient documentation

## 2019-04-15 DIAGNOSIS — R2689 Other abnormalities of gait and mobility: Secondary | ICD-10-CM | POA: Diagnosis present

## 2019-04-15 DIAGNOSIS — R278 Other lack of coordination: Secondary | ICD-10-CM | POA: Diagnosis present

## 2019-04-15 DIAGNOSIS — I69354 Hemiplegia and hemiparesis following cerebral infarction affecting left non-dominant side: Secondary | ICD-10-CM | POA: Insufficient documentation

## 2019-04-15 DIAGNOSIS — I69315 Cognitive social or emotional deficit following cerebral infarction: Secondary | ICD-10-CM | POA: Insufficient documentation

## 2019-04-15 DIAGNOSIS — R41842 Visuospatial deficit: Secondary | ICD-10-CM | POA: Insufficient documentation

## 2019-04-15 NOTE — Therapy (Signed)
[facility] [address] [place], [place], [address] Phone: [phone]   Fax:  [phone]  Occupational Therapy Treatment  Patient Details  Name: Deanna [name] MRN: [id number] Date of Birth: 03-02-1951 Referring Provider (OT): Dr. Letta Pate   Encounter Date: 04/15/2019  OT End of Session - 04/15/19 1241    Visit Number  7    Number of Visits  24    Date for OT Re-Evaluation  06/04/19    Authorization Type  Medicare, [employer]    Authorization Time Period  may d/c earlier dependenting on progress    Authorization - Visit Number  7    Authorization - Number of Visits  10    OT Start Time  1238    OT Stop Time  1318    OT Time Calculation (min)  40 min    Activity Tolerance  Patient tolerated treatment well    Behavior During Therapy  [facility] for tasks assessed/performed       Past Medical History:  Diagnosis Date  . Arthritis    osteoarthritis in  both knees  . Asthma   . Difficult intravenous access   . GERD (gastroesophageal reflux disease)   . Heart murmur    born with years ago  . Pre-diabetes    last A1C=6.0 per pt  . Torn meniscus    left knee w fracture x2 to left knee  . Wears glasses     Past Surgical History:  Procedure Laterality Date  . BUBBLE STUDY  12/02/2018   Procedure: BUBBLE STUDY;  Surgeon: Elouise Munroe, MD;  Location: [place];  Service: Cardiology;;  . CHOLECYSTECTOMY    . CHONDROPLASTY Right 07/09/2017   Procedure: CHONDROPLASTY;  Surgeon: Sydnee Cabal, MD;  Location: [facility];  Service: Orthopedics;  Laterality: Right;  . HERNIA REPAIR Right age 38   inguinal  . IR CT HEAD LTD  11/29/2018  . IR PERCUTANEOUS ART THROMBECTOMY/INFUSION INTRACRANIAL INC DIAG ANGIO  11/29/2018  . KNEE ARTHROSCOPY WITH MEDIAL MENISECTOMY Left 01/10/2017   Procedure: LEFT KNEE ARTHROSCOPY, DEBRIDEMENT, PARTIAL MEDIAL MENISCECTOMY, CHONDROPLASTY, ARTHROSCOPIC ASSISTED  INTERNAL FIXATION OF MEDIAL FEMORAL CONDYLE AND MEDIAL TIBIAL PLATEAU INSUFFICIENCY FRACTURES;  Surgeon: Sydnee Cabal, MD;  Location: [place];  Service: Orthopedics;  Laterality: Left;  . KNEE ARTHROSCOPY WITH MEDIAL MENISECTOMY Right 07/09/2017   Procedure: Right knee scope, debridement, chondroplasty, partial meniscectomy, internal arthroscopic assisted internal fixation of medial tibial plateau fracture;  Surgeon: Sydnee Cabal, MD;  Location: [facility];  Service: Orthopedics;  Laterality: Right;  . LOOP RECORDER INSERTION N/A 12/02/2018   Procedure: LOOP RECORDER INSERTION;  Surgeon: Evans Lance, MD;  Location: [place] [facility];  Service: Cardiovascular;  Laterality: N/A;  . RADIOLOGY WITH ANESTHESIA N/A 11/29/2018   Procedure: RADIOLOGY WITH ANESTHESIA;  Surgeon: Luanne Bras, MD;  Location: [place];  Service: Radiology;  Laterality: N/A;  . TEE WITHOUT CARDIOVERSION N/A 12/02/2018   Procedure: TRANSESOPHAGEAL ECHOCARDIOGRAM (TEE);  Surgeon: Elouise Munroe, MD;  Location: [place];  Service: Cardiology;  Laterality: N/A;  . TUBAL LIGATION  age 63    There were no vitals filed for this visit.  Subjective Assessment - 04/15/19 1238    Subjective   Pt reports that she mashed cranberries, mixed up pies, cracked egg, used mixer.  Washed dishes standing at the sink, cleaned toilets, dusted some.    Pertinent History  68 year old female with history of prediabetes, OA  bilateral knees, obesity who was admitted on 11/29/2018 with left-sided tingling and left lower extremity weakness with right gaze preference.  CTA head neck with perfusion showed proximal right M2 occlusion with associated right MCA infarct with penumbra and moderate cervical artery atherosclerosis.  She underwent cerebral angiogram with endovascular revascularization of right MCA  Follow-up MRI brain showed moderate acute right-MCA infarct with petechial hemorrhage in 1 of 2 acute  left frontoparietal infarcts.    On 07/20, she had increasing left-sided weakness with nausea and follow-up CT head showed large edematous infarct in right MCA with substantial increase in hypodensity and new hemorrhagic conversion in deep white matter of posterior frontal and temporal lobes    Patient Stated Goals  improve use of LUE    Currently in Pain?  No/denies    Pain Onset  Today       Placing grooved pegs in pegboard for incr coordination/in-hand manipulation with mod difficulty and incr time.  Pt with incr difficulty with conversation/distractions.  Shoulder flex with cane in sitting for closed-chain movement with min v.c. for positioning.  Completing 48 piece puzzle with mod difficulty/cueing for edge pieces only.  Pt with difficulty with problem-solving and visual perception, particularly on L side.  Did not complete due to time constraints.       OT Short Term Goals - 04/08/19 1324      OT SHORT TERM GOAL #1   Title  I with HEP    Time  4    Period  Weeks    Status  Achieved    Target Date  04/05/19      OT SHORT TERM GOAL #2   Title  Pt will demonstrate improved fine motor coordination for ADLs as evidenced by decreasing 9 hole peg test score to 50 secs or less.    Baseline  RUE 20.56, LUE 56.87    Time  4    Period  Weeks    Status  Achieved   04/08/19:  37.81sec     OT SHORT TERM GOAL #3   Title  Pt will demonstrate ability to retrieve a lightweight object with LUE at 110*sh. flexion  for increased functional reach.    Baseline  L sh. flexion 105    Time  4    Period  Weeks    Status  Achieved   04/08/19:  145*     OT SHORT TERM GOAL #4   Title  Pt will perform basic cooking with supervision and min v.c    Time  4    Period  Weeks    Status  On-going   Pt reports 04/08/19:  pt doing simple tasks (mixing, stirring, a little cutting)     OT SHORT TERM GOAL #5   Title  Pt will perform environmental scanning with 80% or better accuracy.    Time  4     Period  Weeks    Status  Achieved   04/08/19:  Simple environmental scanning in min distracting environment with 100% accuracy       OT Long Term Goals - 03/06/19 1034      OT LONG TERM GOAL #1   Title  I with updated HEP.    Time  12    Period  Weeks    Status  New    Target Date  06/04/19      OT LONG TERM GOAL #2   Title  Pt will demonstrate improved fine motor coordination for IADLS  as  evidenced  by decreasing 9 hole peg test score to 45 secs or less.    Baseline  LUE 56.87 secs    Time  12    Period  Weeks    Status  New      OT LONG TERM GOAL #3   Title  Pt will demonstrate ability to retrieve a 1 lbs object from overhead shelf at 115* with good control.    Baseline  L sh. flex 105    Time  12    Period  Weeks    Status  New      OT LONG TERM GOAL #4   Title  Pt will resume perfromance of mod complex cooking and home management modified independently.    Time  12    Period  Weeks    Status  New      OT LONG TERM GOAL #5   Title  Pt will perfrom environmental scanning in a busy environment with 90% or better accuracy.    Time  12    Period  Weeks    Status  New      Long Term Additional Goals   Additional Long Term Goals  Yes      OT LONG TERM GOAL #6   Title  Pt will demonstrate ability to perfrom a physical and cognitive task simultaneously with 90% or better accuracy in prep for return to driving.    Time  12    Period  Weeks    Status  New            Plan - 04/15/19 1243    Clinical Impression Statement  Pt continues to progress towards goals with improving coordination, but continues to demo difficulty with divided attention and visual-perceptual deficits.    Occupational performance deficits (Please refer to evaluation for details):  ADL's;IADL's;Leisure;Social Participation    Body Structure / Function / Physical Skills  ADL;Balance;Mobility;Strength;UE functional use;FMC;Coordination;Gait;Vision;ROM;GMC;Decreased knowledge of  precautions;Decreased knowledge of use of DME;IADL;Dexterity;Sensation    Cognitive Skills  Attention;Safety Awareness;Problem Solve    Rehab Potential  Good    OT Frequency  2x / week    OT Treatment/Interventions  Self-care/ADL training;Therapeutic exercise;Balance training;Functional Mobility Training;Manual Therapy;Neuromuscular education;Ultrasound;Aquatic Therapy;Therapeutic activities;Cognitive remediation/compensation;DME and/or AE instruction;Paraffin;Cryotherapy;Fluidtherapy;Visual/perceptual remediation/compensation;Patient/family education;Passive range of motion;Moist Heat;Electrical Stimulation    Plan  ? simple cooking task, visual scanning    Consulted and Agree with Plan of Care  Patient       Patient will benefit from skilled therapeutic intervention in order to improve the following deficits and impairments:   Body Structure / Function / Physical Skills: ADL, Balance, Mobility, Strength, UE functional use, FMC, Coordination, Gait, Vision, ROM, GMC, Decreased knowledge of precautions, Decreased knowledge of use of DME, IADL, Dexterity, Sensation Cognitive Skills: Attention, Safety Awareness, Problem Solve     Visit Diagnosis: 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 Acute bilateral deep vein thrombosis (DVT) of popliteal veins (HCC) 12/26/2018  . Labile blood glucose   . Acute deep vein thrombosis (DVT) of tibial vein of left lower extremity ([place])   . Neurogenic bladder   . Slow transit constipation   . Acute deep vein thrombosis (DVT) of left tibial vein (HCC)   . Acute left hemiparesis ([place]) 12/02/2018  . Hyperlipemia  12/02/2018  . Prediabetes 12/02/2018  . Morbid obesity ([place]) 12/02/2018  . Chronic back pain  12/02/2018  . Urinary retention 12/02/2018  . Acute ischemic right MCA stroke ([place]) 12/02/2018  . Acute ischemic stroke ([place]) large R MCA and 2 L infarcts s/p tPA and mechanical thrombectomy 11/29/2018  . 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 S/P right knee arthroscopy 07/09/2017  . S/P knee surgery 01/10/2017    [facility] 04/15/2019, 2:12 PM  [facility] [address] [place], [place], [address] Phone: [phone]   Fax:  [phone]  Name: Deanna [name] MRN: [id number] Date of Birth: 08/03/1950   Vianne Bulls, OTR/L [facility] [address] [place], [place]  [address] [phone] phone [phone] 04/15/19 2:12 PM

## 2019-04-15 NOTE — Therapy (Signed)
[facility] [address] [place], [place], [address] Phone: [phone]   Fax:  [phone]  Physical Therapy Treatment/Recert  Patient Details  Name: Deanna [name] MRN: [id number] Date of Birth: 03-01-1951 Referring Provider (PT): Letta Pate Luanna Salk, MD   Encounter Date: 04/15/2019  PT End of Session - 04/15/19 1328    Visit Number  8    Number of Visits  24    Date for PT Re-Evaluation  06/14/19    Authorization Type  [employer]    PT Start Time  1320    PT Stop Time  1401    PT Time Calculation (min)  41 min    Equipment Utilized During Treatment  Gait belt    Activity Tolerance  Patient tolerated treatment well    Behavior During Therapy  WFL for tasks assessed/performed       Past Medical History:  Diagnosis Date  . Arthritis    osteoarthritis in  both knees  . Asthma   . Difficult intravenous access   . GERD (gastroesophageal reflux disease)   . Heart murmur    born with years ago  . Pre-diabetes    last A1C=6.0 per pt  . Torn meniscus    left knee w fracture x2 to left knee  . Wears glasses     Past Surgical History:  Procedure Laterality Date  . BUBBLE STUDY  12/02/2018   Procedure: BUBBLE STUDY;  Surgeon: Elouise Munroe, MD;  Location: [place];  Service: Cardiology;;  . CHOLECYSTECTOMY    . CHONDROPLASTY Right 07/09/2017   Procedure: CHONDROPLASTY;  Surgeon: Sydnee Cabal, MD;  Location: [facility];  Service: Orthopedics;  Laterality: Right;  . HERNIA REPAIR Right age 68   inguinal  . IR CT HEAD LTD  11/29/2018  . IR PERCUTANEOUS ART THROMBECTOMY/INFUSION INTRACRANIAL INC DIAG ANGIO  11/29/2018  . KNEE ARTHROSCOPY WITH MEDIAL MENISECTOMY Left 01/10/2017   Procedure: LEFT KNEE ARTHROSCOPY, DEBRIDEMENT, PARTIAL MEDIAL MENISCECTOMY, CHONDROPLASTY, ARTHROSCOPIC ASSISTED INTERNAL FIXATION OF MEDIAL FEMORAL CONDYLE AND MEDIAL TIBIAL PLATEAU INSUFFICIENCY  FRACTURES;  Surgeon: Sydnee Cabal, MD;  Location: [place];  Service: Orthopedics;  Laterality: Left;  . KNEE ARTHROSCOPY WITH MEDIAL MENISECTOMY Right 07/09/2017   Procedure: Right knee scope, debridement, chondroplasty, partial meniscectomy, internal arthroscopic assisted internal fixation of medial tibial plateau fracture;  Surgeon: Sydnee Cabal, MD;  Location: [facility];  Service: Orthopedics;  Laterality: Right;  . LOOP RECORDER INSERTION N/A 12/02/2018   Procedure: LOOP RECORDER INSERTION;  Surgeon: Evans Lance, MD;  Location: [place] [facility];  Service: Cardiovascular;  Laterality: N/A;  . RADIOLOGY WITH ANESTHESIA N/A 11/29/2018   Procedure: RADIOLOGY WITH ANESTHESIA;  Surgeon: Luanne Bras, MD;  Location: [facility];  Service: Radiology;  Laterality: N/A;  . TEE WITHOUT CARDIOVERSION N/A 12/02/2018   Procedure: TRANSESOPHAGEAL ECHOCARDIOGRAM (TEE);  Surgeon: Elouise Munroe, MD;  Location: [facility];  Service: Cardiology;  Laterality: N/A;  . TUBAL LIGATION  age 16    There were no vitals filed for this visit.  Subjective Assessment - 04/15/19 1327    Subjective  Pt reports that she is doing. Pt was taken off eliquis.    Pertinent History  CVA Rt MCA July 2020    Limitations  Lifting;Standing;Walking;House hold activities    How long can you stand comfortably?  15 min if she has UE support    How long can you walk comfortably?  limited community ambulator  with RW, has not been to grocery store.    Patient Stated Goals  work in the garden again.    Currently in Pain?  No/denies    Pain Onset  In the past 7 days         [facility] PT Assessment - 04/15/19 1331      Assessment   Medical Diagnosis   Right MCA distribution infarct with residual left hemiparesis as well as left inattention.      Referring Provider (PT)  Letta Pate Luanna Salk, MD    Onset Date/Surgical Date  11/29/18         Vestibular Assessment - 04/15/19 0001       Oculomotor Exam   Ocular ROM  intact    Smooth Pursuits  Intact    Comment  peripheral fields intact               [facility] Adult PT Treatment/Exercise - 04/15/19 1331      Ambulation/Gait   Ambulation/Gait  Yes    Ambulation/Gait Assistance  4: Min guard    Ambulation/Gait Assistance Details  Verbal cues to try to increase left heel strike    Ambulation Distance (Feet)  230 Feet    Assistive device  Small based quad cane    Gait Pattern  Step-through pattern;Decreased arm swing - left;Decreased hip/knee flexion - left;Decreased stance time - left;Decreased dorsiflexion - left    Ambulation Surface  Level;Indoor    Gait velocity  15.6 sec=0.35ms   with FWW   Ramp  6: Modified independent (Device)   with FWW   Curb  6: Modified independent (Device/increase time)   with FWW   Gait Comments  Pt ambulated around gym 115' with FWW looking for 6 cones placed various directions. Pt was able to find 6/6 cones scanning environment      Standardized Balance Assessment   Standardized Balance Assessment  Berg Balance Test      Berg Balance Test   Sit to Stand  Able to stand without using hands and stabilize independently    Standing Unsupported  Able to stand safely 2 minutes    Sitting with Back Unsupported but Feet Supported on Floor or Stool  Able to sit safely and securely 2 minutes    Stand to Sit  Sits safely with minimal use of hands    Transfers  Able to transfer safely, minor use of hands    Standing Unsupported with Eyes Closed  Able to stand 10 seconds safely    Standing Ubsupported with Feet Together  Able to place feet together independently and stand 1 minute safely    From Standing, Reach Forward with Outstretched Arm  Can reach confidently >25 cm (10")    From Standing Position, Pick up Object from Floor  Able to pick up shoe safely and easily    From Standing Position, Turn to Look Behind Over each Shoulder  Looks behind from both sides and weight shifts well     Turn 360 Degrees  Needs close supervision or verbal cueing    Standing Unsupported, Alternately Place Feet on Step/Stool  Able to complete >2 steps/needs minimal assist    Standing Unsupported, One Foot in Front  Able to take small step independently and hold 30 seconds    Standing on One Leg  Tries to lift leg/unable to hold 3 seconds but remains standing independently    Total Score  45  PT Long Term Goals - 04/15/19 1329      PT LONG TERM GOAL #1   Title  Pt will be I and compliant with HEP. (Target for all goals 6 weeks 04/15/19)    Baseline  04/03/19: met with current program. Will need to continue to progress this program as she progressing in therapy.    Status  Achieved      PT LONG TERM GOAL #2   Title  Pt will improve TUG to less than 13 sec with LRAD to show improved gait and balance.    Baseline  TUG=12 sec with FWW    Status  Achieved      PT LONG TERM GOAL #3   Title  Pt will improve 5TSTS to less than 13 sec to show improved balance and LE strength    Baseline  04/15/2019 11 sec    Status  Achieved      PT LONG TERM GOAL #4   Title  Pt will improve BERG balance test >47 to show improved balance and less need for AD.    Baseline  on 04/15/2019 Berg=45/56    Status  On-going      PT LONG TERM GOAL #5   Title  Pt will be able to ambulate community distances at least 750 ft mod I with LRAD    Status  On-going       Updated PT goals: PT Short Term Goals - 04/15/19 1523      PT SHORT TERM GOAL #1   Title  Pt will be independent with progressive HEP for strengthening, balance and gait to continue to work on at home.    Time  4    Period  Weeks    Status  New    Target Date  05/15/19      PT SHORT TERM GOAL #2   Title  Pt will increase gait speed from 0.78ms to >0.782m for improved gait safety.    Baseline  0.7440mon 04/15/2019    Time  4    Period  Weeks    Status  New    Target Date  05/15/19      PT SHORT TERM GOAL #3   Title   Pt will ambulate 11572ith quad cane supervision for improved household distances.    Time  4    Period  Weeks    Status  New    Target Date  05/15/19      PT Long Term Goals - 04/15/19 1524      PT LONG TERM GOAL #1   Title  Pt will ambulate >500' on varied surfaces with FWW mod I for all household and short community distances.    Time  8    Period  Weeks    Status  New    Target Date  06/14/19      PT LONG TERM GOAL #2   Title  Pt will increase Berg from 45/56 to 48/56 or higher for improved balance and decreased fall risk.    Baseline  45/56 on 04/15/2019    Time  8    Period  Weeks    Status  New    Target Date  06/14/19           Plan - 04/15/19 1513    Clinical Impression Statement  Pt has shown significant improvements towards all goals. Met TUG and 5 x sit to stand goals showing improving balance and decreased fall risk. BerMerrilee Jansky  score increased significantly to 45/56 which is right at cut off for high fall risk. Pt is ambulating mod I with FWW on level surfaces but short of distance goal. She continues to benefit from skilled PT to further progress her strength, balance and gait to maximize independence in home and community.    Personal Factors and Comorbidities  Comorbidity 1    Comorbidities  CVA, TKA    Examination-Activity Limitations  Carry;Squat;Stairs;Lift;Stand;Locomotion Level;Transfers    Examination-Participation Restrictions  Meal Prep;Cleaning;Driving;Laundry;Shop    Stability/Clinical Decision Making  Evolving/Moderate complexity    Rehab Potential  Good    PT Frequency  2x / week    PT Duration  8 weeks   may decide to transfer to clinic closer to home in Jan   PT Treatment/Interventions  ADLs/Self Care Home Management;Aquatic Therapy;Electrical Stimulation;Cryotherapy;Moist Heat;DME Instruction;Gait training;Stair training;Functional mobility training;Therapeutic activities;Therapeutic exercise;Balance training;Neuromuscular re-education;Manual  techniques;Orthotic Fit/Training;Passive range of motion;Taping    PT Next Visit Plan  continue to work on LE strengthening, balance reactions on compliant surfaces and gait with small base quad cane for short distances. Add in more standing balance to HEP. Pt shooting to transfer to PT clinic closer to home in January if OT ok with that.    PT Home Exercise Plan  Access Code: [email or web address] and added ankle 4 way with yellow and standing H/T raises.    Consulted and Agree with Plan of Care  Patient       Patient will benefit from skilled therapeutic intervention in order to improve the following deficits and impairments:  Abnormal gait, Decreased balance, Decreased coordination, Decreased endurance, Decreased safety awareness, Decreased strength, Difficulty walking, Postural dysfunction, Obesity  Visit Diagnosis: Hemiplegia and hemiparesis following cerebral infarction affecting left non-dominant side (HCC)  Muscle weakness (generalized)  Other abnormalities of gait and mobility     Problem List Patient Active Problem List   Diagnosis Date Noted  . Acute bilateral deep vein thrombosis (DVT) of popliteal veins (HCC) 12/26/2018  . Labile blood glucose   . Acute deep vein thrombosis (DVT) of tibial vein of left lower extremity ([place])   . Neurogenic bladder   . Slow transit constipation   . Acute deep vein thrombosis (DVT) of left tibial vein (HCC)   . Acute left hemiparesis ([place]) 12/02/2018  . Hyperlipemia 12/02/2018  . Prediabetes 12/02/2018  . Morbid obesity ([place]) 12/02/2018  . Chronic back pain 12/02/2018  . Urinary retention 12/02/2018  . Acute ischemic right MCA stroke ([place]) 12/02/2018  . Acute ischemic stroke ([place]) large R MCA and 2 L infarcts s/p tPA and mechanical thrombectomy 11/29/2018  . 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 S/P right knee arthroscopy 07/09/2017  . S/P knee surgery 01/10/2017    Electa Sniff, PT, DPT, NCS 04/15/2019, 3:22 PM  [facility] [address] [place], [place], [address] Phone: [phone]   Fax:  [phone]  Name: Deanna [name] MRN: [id number] Date of Birth: 1950-07-09

## 2019-04-16 ENCOUNTER — Ambulatory Visit (INDEPENDENT_AMBULATORY_CARE_PROVIDER_SITE_OTHER): Payer: Medicare Other | Admitting: *Deleted

## 2019-04-16 DIAGNOSIS — I63511 Cerebral infarction due to unspecified occlusion or stenosis of right middle cerebral artery: Secondary | ICD-10-CM | POA: Diagnosis not present

## 2019-04-16 LAB — CUP PACEART REMOTE DEVICE CHECK
Date Time Interrogation Session: 20201203094130
Implantable Pulse Generator Implant Date: 20200721

## 2019-04-17 ENCOUNTER — Ambulatory Visit: Payer: Medicare Other | Admitting: Occupational Therapy

## 2019-04-17 ENCOUNTER — Encounter: Payer: Self-pay | Admitting: Physical Therapy

## 2019-04-17 ENCOUNTER — Other Ambulatory Visit: Payer: Self-pay

## 2019-04-17 ENCOUNTER — Ambulatory Visit: Payer: Medicare Other | Admitting: Physical Therapy

## 2019-04-17 DIAGNOSIS — R278 Other lack of coordination: Secondary | ICD-10-CM

## 2019-04-17 DIAGNOSIS — I69354 Hemiplegia and hemiparesis following cerebral infarction affecting left non-dominant side: Secondary | ICD-10-CM

## 2019-04-17 DIAGNOSIS — R2689 Other abnormalities of gait and mobility: Secondary | ICD-10-CM

## 2019-04-17 DIAGNOSIS — R41842 Visuospatial deficit: Secondary | ICD-10-CM

## 2019-04-17 DIAGNOSIS — M6281 Muscle weakness (generalized): Secondary | ICD-10-CM

## 2019-04-17 NOTE — Patient Instructions (Signed)
Access Code: [email or web address]  URL: [email or web address]  Date: 04/17/2019  Prepared by: Willow Ora   Exercises Sit to Stand without Arm Support - 10 reps - 1-2 sets - 2x daily - 6x weekly Walking March - 3 reps - 1 sets - 1x daily - 5x weekly Tandem Walking with Counter Support - 3 reps - 1 sets - 1x daily - 5x weekly Standing Single Leg Stance with Counter Support - 10 reps - 3 sets - 2x daily - 6x weekly Standing Near Stance in Corner with Eyes Closed - 3 reps - 1 sets - 30 hold - 1x daily - 5x weekly Standing Balance in Corner with Eyes Closed - 10 reps - 1 sets - 1x daily - 5x weekly

## 2019-04-17 NOTE — Therapy (Signed)
[facility] [address] [place], [place], [address] Phone: [phone]   Fax:  [phone]  Occupational Therapy Treatment  Patient Details  Name: Deanna [name] MRN: [id number] Date of Birth: 1951/03/23 Referring Provider (OT): Dr. Letta Pate   Encounter Date: 04/17/2019  OT End of Session - 04/17/19 1422    Visit Number  8    Number of Visits  24    Date for OT Re-Evaluation  06/04/19    Authorization Type  Medicare, [employer]    Authorization Time Period  may d/c earlier dependenting on progress    Authorization - Visit Number  8    Authorization - Number of Visits  10    OT Start Time  1405    OT Stop Time  1445    OT Time Calculation (min)  40 min    Activity Tolerance  Patient tolerated treatment well    Behavior During Therapy  [facility] for tasks assessed/performed       Past Medical History:  Diagnosis Date  . Arthritis    osteoarthritis in  both knees  . Asthma   . Difficult intravenous access   . GERD (gastroesophageal reflux disease)   . Heart murmur    born with years ago  . Pre-diabetes    last A1C=6.0 per pt  . Torn meniscus    left knee w fracture x2 to left knee  . Wears glasses     Past Surgical History:  Procedure Laterality Date  . BUBBLE STUDY  12/02/2018   Procedure: BUBBLE STUDY;  Surgeon: Elouise Munroe, MD;  Location: [place] [facility];  Service: Cardiology;;  . CHOLECYSTECTOMY    . CHONDROPLASTY Right 07/09/2017   Procedure: CHONDROPLASTY;  Surgeon: Sydnee Cabal, MD;  Location: [facility];  Service: Orthopedics;  Laterality: Right;  . HERNIA REPAIR Right age 15   inguinal  . IR CT HEAD LTD  11/29/2018  . IR PERCUTANEOUS ART THROMBECTOMY/INFUSION INTRACRANIAL INC DIAG ANGIO  11/29/2018  . KNEE ARTHROSCOPY WITH MEDIAL MENISECTOMY Left 01/10/2017   Procedure: LEFT KNEE ARTHROSCOPY, DEBRIDEMENT, PARTIAL MEDIAL MENISCECTOMY, CHONDROPLASTY, ARTHROSCOPIC ASSISTED  INTERNAL FIXATION OF MEDIAL FEMORAL CONDYLE AND MEDIAL TIBIAL PLATEAU INSUFFICIENCY FRACTURES;  Surgeon: Sydnee Cabal, MD;  Location: [facility];  Service: Orthopedics;  Laterality: Left;  . KNEE ARTHROSCOPY WITH MEDIAL MENISECTOMY Right 07/09/2017   Procedure: Right knee scope, debridement, chondroplasty, partial meniscectomy, internal arthroscopic assisted internal fixation of medial tibial plateau fracture;  Surgeon: Sydnee Cabal, MD;  Location: [facility];  Service: Orthopedics;  Laterality: Right;  . LOOP RECORDER INSERTION N/A 12/02/2018   Procedure: LOOP RECORDER INSERTION;  Surgeon: Evans Lance, MD;  Location: [facility];  Service: Cardiovascular;  Laterality: N/A;  . RADIOLOGY WITH ANESTHESIA N/A 11/29/2018   Procedure: RADIOLOGY WITH ANESTHESIA;  Surgeon: Luanne Bras, MD;  Location: [facility];  Service: Radiology;  Laterality: N/A;  . TEE WITHOUT CARDIOVERSION N/A 12/02/2018   Procedure: TRANSESOPHAGEAL ECHOCARDIOGRAM (TEE);  Surgeon: Elouise Munroe, MD;  Location: [place];  Service: Cardiology;  Laterality: N/A;  . TUBAL LIGATION  age 55    There were no vitals filed for this visit.  Subjective Assessment - 04/17/19 1407    Pertinent History  68 year old female with history of prediabetes, OA bilateral knees, obesity who was admitted on 11/29/2018 with left-sided tingling and left lower extremity weakness with right gaze preference.  CTA head neck with perfusion showed proximal right M2  occlusion with associated right MCA infarct with penumbra and moderate cervical artery atherosclerosis.  She underwent cerebral angiogram with endovascular revascularization of right MCA  Follow-up MRI brain showed moderate acute right-MCA infarct with petechial hemorrhage in 1 of 2 acute left frontoparietal infarcts.    On 07/20, she had increasing left-sided weakness with nausea and follow-up CT head showed large edematous infarct in right MCA with  substantial increase in hypodensity and new hemorrhagic conversion in deep white matter of posterior frontal and temporal lobes    Patient Stated Goals  improve use of LUE    Currently in Pain?  No/denies    Pain Onset  Today           Treatment:Seated at tabletop copying small Deanna design with LUE for increased fine motor coordination and visual perceptual skills. Min difficulty with in hand manipulation and min questioning cues for correct design. Pt made errors with design when attention was divided by conversation. Removing pegs with in hand manipulation, min difficulty. Scavenger hunt to locate items from list in buisy environment with good accuracy Ambulating while performing cognitive task, for divided attention min difficulty/ v.c                  OT Short Term Goals - 04/08/19 1324      OT SHORT TERM GOAL #1   Title  I with HEP    Time  4    Period  Weeks    Status  Achieved    Target Date  04/05/19      OT SHORT TERM GOAL #2   Title  Pt will demonstrate improved fine motor coordination for ADLs as evidenced by decreasing 9 hole Deanna test score to 50 secs or less.    Baseline  RUE 20.56, LUE 56.87    Time  4    Period  Weeks    Status  Achieved   04/08/19:  37.81sec     OT SHORT TERM GOAL #3   Title  Pt will demonstrate ability to retrieve a lightweight object with LUE at 110*sh. flexion  for increased functional reach.    Baseline  L sh. flexion 105    Time  4    Period  Weeks    Status  Achieved   04/08/19:  145*     OT SHORT TERM GOAL #4   Title  Pt will perform basic cooking with supervision and min v.c    Time  4    Period  Weeks    Status  On-going   Pt reports 04/08/19:  pt doing simple tasks (mixing, stirring, a little cutting)     OT SHORT TERM GOAL #5   Title  Pt will perform environmental scanning with 80% or better accuracy.    Time  4    Period  Weeks    Status  Achieved   04/08/19:  Simple environmental scanning in min  distracting environment with 100% accuracy       OT Long Term Goals - 03/06/19 1034      OT LONG TERM GOAL #1   Title  I with updated HEP.    Time  12    Period  Weeks    Status  New    Target Date  06/04/19      OT LONG TERM GOAL #2   Title  Pt will demonstrate improved fine motor coordination for IADLS  as evidenced  by decreasing 9 hole Deanna test score to 45  secs or less.    Baseline  LUE 56.87 secs    Time  12    Period  Weeks    Status  New      OT LONG TERM GOAL #3   Title  Pt will demonstrate ability to retrieve a 1 lbs object from overhead shelf at 115* with good control.    Baseline  L sh. flex 105    Time  12    Period  Weeks    Status  New      OT LONG TERM GOAL #4   Title  Pt will resume perfromance of mod complex cooking and home management modified independently.    Time  12    Period  Weeks    Status  New      OT LONG TERM GOAL #5   Title  Pt will perfrom environmental scanning in a busy environment with 90% or better accuracy.    Time  12    Period  Weeks    Status  New      Long Term Additional Goals   Additional Long Term Goals  Yes      OT LONG TERM GOAL #6   Title  Pt will demonstrate ability to perfrom a physical and cognitive task simultaneously with 90% or better accuracy in prep for return to driving.    Time  12    Period  Weeks    Status  New            Plan - 04/17/19 1423    Clinical Impression Statement  Pt continues to progress towards goals with improving coordination. Pt continues to demonstrate decreased divided attention and visual perceptual deficits.    Occupational performance deficits (Please refer to evaluation for details):  ADL's;IADL's;Leisure;Social Participation    Body Structure / Function / Physical Skills  ADL;Balance;Mobility;Strength;UE functional use;FMC;Coordination;Gait;Vision;ROM;GMC;Decreased knowledge of precautions;Decreased knowledge of use of DME;IADL;Dexterity;Sensation    Cognitive Skills   Attention;Safety Awareness;Problem Solve    Rehab Potential  Good    OT Frequency  2x / week    OT Treatment/Interventions  Self-care/ADL training;Therapeutic exercise;Balance training;Functional Mobility Training;Manual Therapy;Neuromuscular education;Ultrasound;Aquatic Therapy;Therapeutic activities;Cognitive remediation/compensation;DME and/or AE instruction;Paraffin;Cryotherapy;Fluidtherapy;Visual/perceptual remediation/compensation;Patient/family education;Passive range of motion;Moist Heat;Electrical Stimulation    Plan  ? simple cooking task, LUE coordination/ functional use    Consulted and Agree with Plan of Care  Patient       Patient will benefit from skilled therapeutic intervention in order to improve the following deficits and impairments:   Body Structure / Function / Physical Skills: ADL, Balance, Mobility, Strength, UE functional use, FMC, Coordination, Gait, Vision, ROM, GMC, Decreased knowledge of precautions, Decreased knowledge of use of DME, IADL, Dexterity, Sensation Cognitive Skills: Attention, Safety Awareness, Problem Solve     Visit Diagnosis: 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 Acute bilateral deep vein thrombosis (DVT) of popliteal veins (HCC) 12/26/2018  . Labile blood glucose   . Acute deep vein thrombosis (DVT) of tibial vein of left lower extremity ([place])   . Neurogenic bladder   . Slow transit constipation   . Acute deep vein thrombosis (DVT) of left tibial vein (HCC)   . Acute left hemiparesis ([place]) 12/02/2018  . Hyperlipemia 12/02/2018  . Prediabetes 12/02/2018  . Morbid obesity (  [name] II) 12/02/2018  . Chronic back pain 12/02/2018  . Urinary retention 12/02/2018  . Acute ischemic right MCA  stroke ([place]) 12/02/2018  . Acute ischemic stroke ([place]) large R MCA and 2 L infarcts s/p tPA and mechanical thrombectomy 11/29/2018  . 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 S/P right knee arthroscopy 07/09/2017  . S/P knee surgery 01/10/2017    Jamekia Gannett 04/17/2019, 2:25 PM  [facility] [address] [place], [place], [address] Phone: [phone]   Fax:  [phone]  Name: Deanna [name] MRN: [id number] Date of Birth: [birth year]

## 2019-04-18 NOTE — Therapy (Signed)
[facility] [address] [place], [place], [address] Phone: [phone]   Fax:  [phone]  Physical Therapy Treatment  Patient Details  Name: Deanna [name] MRN: [id number] Date of Birth: 1951-01-19 Referring Provider (PT): Letta Pate Luanna Salk, MD   Encounter Date: 04/17/2019  PT End of Session - 04/17/19 1449    Visit Number  9    Number of Visits  24    Date for PT Re-Evaluation  06/14/19    Authorization Type  [employer]    PT Start Time  1446    PT Stop Time  1530    PT Time Calculation (min)  44 min    Equipment Utilized During Treatment  Gait belt    Activity Tolerance  Patient tolerated treatment well    Behavior During Therapy  [facility] for tasks assessed/performed       Past Medical History:  Diagnosis Date  . Arthritis    osteoarthritis in  both knees  . Asthma   . Difficult intravenous access   . GERD (gastroesophageal reflux disease)   . Heart murmur    born with years ago  . Pre-diabetes    last A1C=6.0 per pt  . Torn meniscus    left knee w fracture x2 to left knee  . Wears glasses     Past Surgical History:  Procedure Laterality Date  . BUBBLE STUDY  12/02/2018   Procedure: BUBBLE STUDY;  Surgeon: Elouise Munroe, MD;  Location: [facility];  Service: Cardiology;;  . CHOLECYSTECTOMY    . CHONDROPLASTY Right 07/09/2017   Procedure: CHONDROPLASTY;  Surgeon: Sydnee Cabal, MD;  Location: [facility];  Service: Orthopedics;  Laterality: Right;  . HERNIA REPAIR Right age 6   inguinal  . IR CT HEAD LTD  11/29/2018  . IR PERCUTANEOUS ART THROMBECTOMY/INFUSION INTRACRANIAL INC DIAG ANGIO  11/29/2018  . KNEE ARTHROSCOPY WITH MEDIAL MENISECTOMY Left 01/10/2017   Procedure: LEFT KNEE ARTHROSCOPY, DEBRIDEMENT, PARTIAL MEDIAL MENISCECTOMY, CHONDROPLASTY, ARTHROSCOPIC ASSISTED INTERNAL FIXATION OF MEDIAL FEMORAL CONDYLE AND MEDIAL TIBIAL PLATEAU INSUFFICIENCY  FRACTURES;  Surgeon: Sydnee Cabal, MD;  Location: [facility];  Service: Orthopedics;  Laterality: Left;  . KNEE ARTHROSCOPY WITH MEDIAL MENISECTOMY Right 07/09/2017   Procedure: Right knee scope, debridement, chondroplasty, partial meniscectomy, internal arthroscopic assisted internal fixation of medial tibial plateau fracture;  Surgeon: Sydnee Cabal, MD;  Location: [facility];  Service: Orthopedics;  Laterality: Right;  . LOOP RECORDER INSERTION N/A 12/02/2018   Procedure: LOOP RECORDER INSERTION;  Surgeon: Evans Lance, MD;  Location: [facility];  Service: Cardiovascular;  Laterality: N/A;  . RADIOLOGY WITH ANESTHESIA N/A 11/29/2018   Procedure: RADIOLOGY WITH ANESTHESIA;  Surgeon: Luanne Bras, MD;  Location: [facility];  Service: Radiology;  Laterality: N/A;  . TEE WITHOUT CARDIOVERSION N/A 12/02/2018   Procedure: TRANSESOPHAGEAL ECHOCARDIOGRAM (TEE);  Surgeon: Elouise Munroe, MD;  Location: [facility];  Service: Cardiology;  Laterality: N/A;  . TUBAL LIGATION  age 59    There were no vitals filed for this visit.  Subjective Assessment - 04/17/19 1447    Subjective  No new complaints. No falls or pain to report.    Pertinent History  CVA Rt MCA July 2020    Limitations  Lifting;Standing;Walking;House hold activities    How long can you stand comfortably?  15 min if she has UE support    How long can you walk comfortably?  limited community ambulator with RW,  has not been to grocery store.    Patient Stated Goals  work in the garden again.    Currently in Pain?  No/denies    Pain Score  0-No pain            [facility] Adult PT Treatment/Exercise - 04/17/19 1453      Transfers   Transfers  Sit to Stand;Stand to Sit    Sit to Stand  5: Supervision;With upper extremity assist;From chair/3-in-1;From bed    Stand to Sit  5: Supervision;With upper extremity assist;To bed;To chair/3-in-1      Ambulation/Gait   Ambulation/Gait  Yes     Ambulation/Gait Assistance  4: Min guard    Ambulation Distance (Feet)  30 Feet   x2   Assistive device  Small based quad cane    Gait Pattern  Step-through pattern;Decreased arm swing - left;Decreased hip/knee flexion - left;Decreased stance time - left;Decreased dorsiflexion - left    Ambulation Surface  Level;Indoor      Exercises   Exercises  Other Exercises    Other Exercises   reviewed current HEP: issues with getting the band on her leg for 4 way kickes while seated. Discussed use of tongs to assist with this as she does not have a reacher. Performed sit<>stands with forward reaching for 5 reps, still with some unsteadiness and fatigue with this, therefore did not advance this one. at the counter:        Issued the following to HEP today. Cues needed on form and technique. Min guard assist with balance ex's for safety.  Access Code: [email or web address]  URL: [email or web address]  Date: 04/17/2019  Prepared by: Willow Ora   Exercises Sit to Stand without Arm Support - 10 reps - 1-2 sets - 2x daily - 6x weekly Walking March - 3 reps - 1 sets - 1x daily - 5x weekly Tandem Walking with Counter Support - 3 reps - 1 sets - 1x daily - 5x weekly Standing Single Leg Stance with Counter Support - 10 reps - 3 sets - 2x daily - 6x weekly Standing Near Stance in Corner with Eyes Closed - 3 reps - 1 sets - 30 hold - 1x daily - 5x weekly Standing Balance in Corner with Eyes Closed - 10 reps - 1 sets - 1x daily - 5x weekly     04/17/19 2204  PT Education  Education Details updated/advanced HEP  Person(s) Educated Patient  Methods Explanation;Demonstration;Handout  Comprehension Verbalized understanding;Returned demonstration;Verbal cues required;Need further instruction      PT Short Term Goals - 04/15/19 1523      PT SHORT TERM GOAL #1   Title  Pt will be independent with progressive HEP for strengthening, balance and gait to continue to work on at home.    Time  4    Period   Weeks    Status  New    Target Date  05/15/19      PT SHORT TERM GOAL #2   Title  Pt will increase gait speed from 0.33m/s to >0.60m/s for improved gait safety.    Baseline  0.41m/s on 04/15/2019    Time  4    Period  Weeks    Status  New    Target Date  05/15/19      PT SHORT TERM GOAL #3   Title  Pt will ambulate 23' with quad cane supervision for improved household distances.    Time  4    Period  Weeks  Status  New    Target Date  05/15/19        PT Long Term Goals - 04/15/19 1524      PT LONG TERM GOAL #1   Title  Pt will ambulate >500' on varied surfaces with FWW mod I for all household and short community distances.    Time  8    Period  Weeks    Status  New    Target Date  06/14/19      PT LONG TERM GOAL #2   Title  Pt will increase Berg from 45/56 to 48/56 or higher for improved balance and decreased fall risk.    Baseline  45/56 on 04/15/2019    Time  8    Period  Weeks    Status  New    Target Date  06/14/19         04/17/19 1449  Plan  Clinical Impression Statement Today's skilled session focused on review of current HEP with advancements/upgrades made today. No issues reported with performance in session. The pt is progressing toward goals and should benefit from continued PT to progress toward unmet goals.  Personal Factors and Comorbidities Comorbidity 1  Comorbidities CVA, TKA  Examination-Activity Limitations Carry;Squat;Stairs;Lift;Stand;Locomotion Level;Transfers  Examination-Participation Restrictions Meal Prep;Cleaning;Driving;Laundry;Shop  Pt will benefit from skilled therapeutic intervention in order to improve on the following deficits Abnormal gait;Decreased balance;Decreased coordination;Decreased endurance;Decreased safety awareness;Decreased strength;Difficulty walking;Postural dysfunction;Obesity  Stability/Clinical Decision Making Evolving/Moderate complexity  Rehab Potential Good  PT Frequency 2x / week  PT Duration 8 weeks (may  decide to transfer to clinic closer to home in Jan)  PT Treatment/Interventions ADLs/Self Care Home Management;Aquatic Therapy;Electrical Stimulation;Cryotherapy;Moist Heat;DME Instruction;Gait training;Stair training;Functional mobility training;Therapeutic activities;Therapeutic exercise;Balance training;Neuromuscular re-education;Manual techniques;Orthotic Fit/Training;Passive range of motion;Taping  PT Next Visit Plan 10 th visit progress note due next visit; continue to work on LE strengthening, balance reactions on compliant surfaces and gait with small base quad cane for short distances.  Pt shooting to transfer to PT clinic closer to home in January if OT ok with that.  PT Home Exercise Plan Access Code: [email or web address] and added ankle 4 way with yellow and standing H/T raises.  Consulted and Agree with Plan of Care Patient          Patient will benefit from skilled therapeutic intervention in order to improve the following deficits and impairments:  Abnormal gait, Decreased balance, Decreased coordination, Decreased endurance, Decreased safety awareness, Decreased strength, Difficulty walking, Postural dysfunction, Obesity  Visit Diagnosis: Hemiplegia and hemiparesis following cerebral infarction affecting left non-dominant side (HCC)  Muscle weakness (generalized)  Other abnormalities of gait and mobility     Problem List Patient Active Problem List   Diagnosis Date Noted  . Acute bilateral deep vein thrombosis (DVT) of popliteal veins (HCC) 12/26/2018  . Labile blood glucose   . Acute deep vein thrombosis (DVT) of tibial vein of left lower extremity ([place])   . Neurogenic bladder   . Slow transit constipation   . Acute deep vein thrombosis (DVT) of left tibial vein (HCC)   . Acute left hemiparesis ([place]) 12/02/2018  . Hyperlipemia 12/02/2018  . Prediabetes 12/02/2018  . Morbid obesity ([place]) 12/02/2018  . Chronic back pain 12/02/2018  . Urinary retention 12/02/2018  . Acute  ischemic right MCA stroke ([place]) 12/02/2018  . Acute ischemic stroke ([place]) large R MCA and 2 L infarcts s/p tPA and mechanical thrombectomy 11/29/2018  . 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 S/P right knee arthroscopy  07/09/2017  . S/P knee surgery 01/10/2017    Willow Ora, PTA, [address] [facility] [address], [place], [place] [address] [phone] 04/18/19, 9:58 PM   Name: Deanna [name] MRN: [id number] Date of Birth: 09-04-1950

## 2019-04-20 ENCOUNTER — Ambulatory Visit: Payer: Medicare Other | Admitting: Occupational Therapy

## 2019-04-20 ENCOUNTER — Encounter: Payer: Self-pay | Admitting: Physical Therapy

## 2019-04-20 ENCOUNTER — Other Ambulatory Visit: Payer: Self-pay

## 2019-04-20 ENCOUNTER — Ambulatory Visit: Payer: Medicare Other | Admitting: Physical Therapy

## 2019-04-20 DIAGNOSIS — R2689 Other abnormalities of gait and mobility: Secondary | ICD-10-CM

## 2019-04-20 DIAGNOSIS — I69354 Hemiplegia and hemiparesis following cerebral infarction affecting left non-dominant side: Secondary | ICD-10-CM | POA: Diagnosis not present

## 2019-04-20 DIAGNOSIS — R278 Other lack of coordination: Secondary | ICD-10-CM

## 2019-04-20 DIAGNOSIS — R41842 Visuospatial deficit: Secondary | ICD-10-CM

## 2019-04-20 DIAGNOSIS — M6281 Muscle weakness (generalized): Secondary | ICD-10-CM

## 2019-04-20 NOTE — Therapy (Signed)
[facility] [address] [place], [place], [address] Phone: [phone]   Fax:  [phone]  Physical Therapy Treatment/10th Visit Progress Note   Patient Details  Name: Deanna [name] MRN: [id number] Date of Birth: [birth year] Referring Provider (PT): Letta Pate Luanna Salk, MD  Progress Note Reporting Period 03/04/19 to 04/20/19   See note below for Objective Data and Assessment of Progress/Goals.    Encounter Date: 04/20/2019  PT End of Session - 04/20/19 2134    Visit Number  10    Number of Visits  24    Date for PT Re-Evaluation  06/14/19    Authorization Type  [employer]    PT Start Time  0203    PT Stop Time  0245    PT Time Calculation (min)  42 min    Equipment Utilized During Treatment  Gait belt    Activity Tolerance  Patient tolerated treatment well    Behavior During Therapy  WFL for tasks assessed/performed       Past Medical History:  Diagnosis Date  . Arthritis    osteoarthritis in  both knees  . Asthma   . Difficult intravenous access   . GERD (gastroesophageal reflux disease)   . Heart murmur    born with years ago  . Pre-diabetes    last A1C=6.0 per pt  . Torn meniscus    left knee w fracture x2 to left knee  . Wears glasses     Past Surgical History:  Procedure Laterality Date  . BUBBLE STUDY  12/02/2018   Procedure: BUBBLE STUDY;  Surgeon: Elouise Munroe, MD;  Location: [facility];  Service: Cardiology;;  . CHOLECYSTECTOMY    . CHONDROPLASTY Right 07/09/2017   Procedure: CHONDROPLASTY;  Surgeon: Sydnee Cabal, MD;  Location: [facility];  Service: Orthopedics;  Laterality: Right;  . HERNIA REPAIR Right age 63   inguinal  . IR CT HEAD LTD  11/29/2018  . IR PERCUTANEOUS ART THROMBECTOMY/INFUSION INTRACRANIAL INC DIAG ANGIO  11/29/2018  . KNEE ARTHROSCOPY WITH MEDIAL MENISECTOMY Left 01/10/2017   Procedure: LEFT KNEE ARTHROSCOPY, DEBRIDEMENT,  PARTIAL MEDIAL MENISCECTOMY, CHONDROPLASTY, ARTHROSCOPIC ASSISTED INTERNAL FIXATION OF MEDIAL FEMORAL CONDYLE AND MEDIAL TIBIAL PLATEAU INSUFFICIENCY FRACTURES;  Surgeon: Sydnee Cabal, MD;  Location: [facility];  Service: Orthopedics;  Laterality: Left;  . KNEE ARTHROSCOPY WITH MEDIAL MENISECTOMY Right 07/09/2017   Procedure: Right knee scope, debridement, chondroplasty, partial meniscectomy, internal arthroscopic assisted internal fixation of medial tibial plateau fracture;  Surgeon: Sydnee Cabal, MD;  Location: [facility];  Service: Orthopedics;  Laterality: Right;  . LOOP RECORDER INSERTION N/A 12/02/2018   Procedure: LOOP RECORDER INSERTION;  Surgeon: Evans Lance, MD;  Location: [facility];  Service: Cardiovascular;  Laterality: N/A;  . RADIOLOGY WITH ANESTHESIA N/A 11/29/2018   Procedure: RADIOLOGY WITH ANESTHESIA;  Surgeon: Luanne Bras, MD;  Location: [facility];  Service: Radiology;  Laterality: N/A;  . TEE WITHOUT CARDIOVERSION N/A 12/02/2018   Procedure: TRANSESOPHAGEAL ECHOCARDIOGRAM (TEE);  Surgeon: Elouise Munroe, MD;  Location: [facility];  Service: Cardiology;  Laterality: N/A;  . TUBAL LIGATION  age 32    There were no vitals filed for this visit.  Subjective Assessment - 04/20/19 1406    Subjective  No falls. No new complaints. Has been doing her exercises at home that have been going well.    Pertinent History  CVA Rt MCA July 2020    Limitations  Lifting;Standing;Walking;House hold  activities    How long can you stand comfortably?  15 min if she has UE support    How long can you walk comfortably?  limited community ambulator with RW, has not been to grocery store.    Patient Stated Goals  work in the garden again.    Currently in Pain?  No/denies                       [facility] Adult PT Treatment/Exercise - 04/21/19 0001      Ambulation/Gait   Ambulation/Gait  Yes    Ambulation/Gait Assistance  4: Min  guard    Ambulation/Gait Assistance Details  Verbal cues to try to look 5-10 ft ahead due to pt looking at ground throuhgout gait    Ambulation Distance (Feet)  40 Feet   x1, 30' x 3   Assistive device  Small based quad cane    Gait Pattern  Step-through pattern;Decreased arm swing - left;Decreased hip/knee flexion - left;Decreased stance time - left;Decreased dorsiflexion - left;Trunk flexed    Ambulation Surface  Level;Indoor      Neuro Re-ed    Neuro Re-ed Details   In // bars- On foam balance beam: side stepping with BUE fingertip support down and back 2 reps, wide BOS eyes open 2 x 30 seconds, 2 x 5 reps head nods, 2 x 5 reps head turns. 1 x 10 reps forward stepping strategy, 1 x 10 reps backwards stepping strategy - cues for increased L hip and knee flexion. Forward step ups with LLE 1 x 10 reps onto 4" step. On blue foam: step taps onto 4" step with BUE support.  Standing wide BOS alternating shoulder flexion 1 x 10 reps. Wide BOS with eyes closed 2 x 15 seconds - noted pt having incr supination. Pt reports that she has purchased a new pair of shoes (after discussion with primary therapist regarding shoes) and they should be coming in the mail soon.                PT Short Term Goals - 04/15/19 1523      PT SHORT TERM GOAL #1   Title  Pt will be independent with progressive HEP for strengthening, balance and gait to continue to work on at home.    Time  4    Period  Weeks    Status  New    Target Date  05/15/19      PT SHORT TERM GOAL #2   Title  Pt will increase gait speed from 0.38ms to >0.78m for improved gait safety.    Baseline  0.7473mon 04/15/2019    Time  4    Period  Weeks    Status  New    Target Date  05/15/19      PT SHORT TERM GOAL #3   Title  Pt will ambulate 11579ith quad cane supervision for improved household distances.    Time  4    Period  Weeks    Status  New    Target Date  05/15/19        PT Long Term Goals - 04/15/19 1524      PT  LONG TERM GOAL #1   Title  Pt will ambulate >500' on varied surfaces with FWW mod I for all household and short community distances.    Time  8    Period  Weeks    Status  New    Target  Date  06/14/19      PT LONG TERM GOAL #2   Title  Pt will increase Berg from 45/56 to 48/56 or higher for improved balance and decreased fall risk.    Baseline  45/56 on 04/15/2019    Time  8    Period  Weeks    Status  New    Target Date  06/14/19            Plan - 04/21/19 0933    Clinical Impression Statement  10th visit progress note: goals checked on 80/9/98 for re-cert for POC. Pt met TUG and 5x sit ,> stand goals - pt able to perform TUG in 12 seconds with FWW, putting pt at a decreased risk for falls. Pt improved her BERG balance score improved significantly to a 45/56 (previously a 33/56) - however pt is right on the cutoff for a high fall risk based on this score. Pt continues to need min guard for balance for ambulation with small base quad cane. Focus of today's skilled session included gait training with small base quad cane and balance strategies on compliant surfaces. Pt is progressing well and will continue to benefit from skilled PT to progress towards LTGs and maximize her functional independence.    Personal Factors and Comorbidities  Comorbidity 1    Comorbidities  CVA, TKA    Examination-Activity Limitations  Carry;Squat;Stairs;Lift;Stand;Locomotion Level;Transfers    Examination-Participation Restrictions  Meal Prep;Cleaning;Driving;Laundry;Shop    Stability/Clinical Decision Making  Evolving/Moderate complexity    Rehab Potential  Good    PT Frequency  2x / week    PT Duration  8 weeks   may decide to transfer to clinic closer to home in Jan   PT Treatment/Interventions  ADLs/Self Care Home Management;Aquatic Therapy;Electrical Stimulation;Cryotherapy;Moist Heat;DME Instruction;Gait training;Stair training;Functional mobility training;Therapeutic activities;Therapeutic  exercise;Balance training;Neuromuscular re-education;Manual techniques;Orthotic Fit/Training;Passive range of motion;Taping    PT Next Visit Plan  did her new sneakers come in? continue to work on LE strengthening, balance reactions on compliant surfaces and gait with small base quad cane for short distances.  Pt shooting to transfer to PT clinic closer to home in January if OT ok with that.    PT Home Exercise Plan  Access Code: [email or web address] and added ankle 4 way with yellow and standing H/T raises.    Consulted and Agree with Plan of Care  Patient       Patient will benefit from skilled therapeutic intervention in order to improve the following deficits and impairments:  Abnormal gait, Decreased balance, Decreased coordination, Decreased endurance, Decreased safety awareness, Decreased strength, Difficulty walking, Postural dysfunction, Obesity  Visit Diagnosis: Other lack of coordination  Muscle weakness (generalized)  Other abnormalities of gait and mobility     Problem List Patient Active Problem List   Diagnosis Date Noted  . Acute bilateral deep vein thrombosis (DVT) of popliteal veins (HCC) 12/26/2018  . Labile blood glucose   . Acute deep vein thrombosis (DVT) of tibial vein of left lower extremity ([place])   . Neurogenic bladder   . Slow transit constipation   . Acute deep vein thrombosis (DVT) of left tibial vein (HCC)   . Acute left hemiparesis ([place]) 12/02/2018  . Hyperlipemia 12/02/2018  . Prediabetes 12/02/2018  . Morbid obesity ([place]) 12/02/2018  . Chronic back pain 12/02/2018  . Urinary retention 12/02/2018  . Acute ischemic right MCA stroke ([place]) 12/02/2018  . Acute ischemic stroke ([facility]) large R MCA and 2 L infarcts s/p tPA and mechanical thrombectomy  11/29/2018  . 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 S/P right knee arthroscopy 07/09/2017  . S/P knee surgery 01/10/2017    Arliss Journey, PT, DPT  04/21/2019, 9:33 AM  [facility] [address] [place], [place], [address] Phone: [phone]   Fax:  [phone]  Name: Deanna [name] MRN: [id number] Date of Birth: 1950/08/24

## 2019-04-20 NOTE — Therapy (Signed)
[facility] [address] [place], [place], [address] Phone: [phone]   Fax:  [phone]  Occupational Therapy Treatment  Patient Details  Name: Deanna [name] MRN: [id number] Date of Birth: [birth year] Referring Provider (OT): Dr. Letta Pate   Encounter Date: 04/20/2019  OT End of Session - 04/20/19 1348    Visit Number  9    Number of Visits  24    Date for OT Re-Evaluation  06/04/19    Authorization Type  Medicare, [employer]    Authorization Time Period  may d/c earlier dependenting on progress    Authorization - Visit Number  9    Authorization - Number of Visits  10    OT Start Time  1318    OT Stop Time  1400    OT Time Calculation (min)  42 min    Activity Tolerance  Patient tolerated treatment well    Behavior During Therapy  [facility] for tasks assessed/performed       Past Medical History:  Diagnosis Date  . Arthritis    osteoarthritis in  both knees  . Asthma   . Difficult intravenous access   . GERD (gastroesophageal reflux disease)   . Heart murmur    born with years ago  . Pre-diabetes    last A1C=6.0 per pt  . Torn meniscus    left knee w fracture x2 to left knee  . Wears glasses     Past Surgical History:  Procedure Laterality Date  . BUBBLE STUDY  12/02/2018   Procedure: BUBBLE STUDY;  Surgeon: Elouise Munroe, MD;  Location: [facility];  Service: Cardiology;;  . CHOLECYSTECTOMY    . CHONDROPLASTY Right 07/09/2017   Procedure: CHONDROPLASTY;  Surgeon: Sydnee Cabal, MD;  Location: [facility];  Service: Orthopedics;  Laterality: Right;  . HERNIA REPAIR Right age 41   inguinal  . IR CT HEAD LTD  11/29/2018  . IR PERCUTANEOUS ART THROMBECTOMY/INFUSION INTRACRANIAL INC DIAG ANGIO  11/29/2018  . KNEE ARTHROSCOPY WITH MEDIAL MENISECTOMY Left 01/10/2017   Procedure: LEFT KNEE ARTHROSCOPY, DEBRIDEMENT, PARTIAL MEDIAL MENISCECTOMY, CHONDROPLASTY, ARTHROSCOPIC ASSISTED  INTERNAL FIXATION OF MEDIAL FEMORAL CONDYLE AND MEDIAL TIBIAL PLATEAU INSUFFICIENCY FRACTURES;  Surgeon: Sydnee Cabal, MD;  Location: [facility];  Service: Orthopedics;  Laterality: Left;  . KNEE ARTHROSCOPY WITH MEDIAL MENISECTOMY Right 07/09/2017   Procedure: Right knee scope, debridement, chondroplasty, partial meniscectomy, internal arthroscopic assisted internal fixation of medial tibial plateau fracture;  Surgeon: Sydnee Cabal, MD;  Location: [facility];  Service: Orthopedics;  Laterality: Right;  . LOOP RECORDER INSERTION N/A 12/02/2018   Procedure: LOOP RECORDER INSERTION;  Surgeon: Evans Lance, MD;  Location: [facility];  Service: Cardiovascular;  Laterality: N/A;  . RADIOLOGY WITH ANESTHESIA N/A 11/29/2018   Procedure: RADIOLOGY WITH ANESTHESIA;  Surgeon: Luanne Bras, MD;  Location: [facility];  Service: Radiology;  Laterality: N/A;  . TEE WITHOUT CARDIOVERSION N/A 12/02/2018   Procedure: TRANSESOPHAGEAL ECHOCARDIOGRAM (TEE);  Surgeon: Elouise Munroe, MD;  Location: [facility];  Service: Cardiology;  Laterality: N/A;  . TUBAL LIGATION  age 27    There were no vitals filed for this visit.  Subjective Assessment - 04/20/19 1342    Subjective   I started practicing w/ my curling iron and made soup; I just needed help getting it on/off stove    Pertinent History  68 year old female with history of prediabetes, OA bilateral knees, obesity who was admitted  on 11/29/2018 with left-sided tingling and left lower extremity weakness with right gaze preference.  CTA head neck with perfusion showed proximal right M2 occlusion with associated right MCA infarct with penumbra and moderate cervical artery atherosclerosis.  She underwent cerebral angiogram with endovascular revascularization of right MCA  Follow-up MRI brain showed moderate acute right-MCA infarct with petechial hemorrhage in 1 of 2 acute left frontoparietal infarcts.    On 07/20, she had  increasing left-sided weakness with nausea and follow-up CT head showed large edematous infarct in right MCA with substantial increase in hypodensity and new hemorrhagic conversion in deep white matter of posterior frontal and temporal lobes    Patient Stated Goals  improve use of LUE    Currently in Pain?  No/denies       Pt making fried egg w/ min cues for safety, locating items in unfamiliar kitchen, and for planning and sequencing. Pt turned off stove I'ly; required cues to return items to refrigerator. Pt only needed assist w/ transporting items (secondary to no walker basket).  Pt copying small peg design Lt hand for coordination and visual perceptual skills: pt had min difficulty and drops using Lt hand, however pt had max difficulty and required therapist assist to copy design and to correct mistakes with peg design (2 overlapping squares).                       OT Short Term Goals - 04/20/19 1353      OT SHORT TERM GOAL #1   Title  I with HEP    Time  4    Period  Weeks    Status  Achieved    Target Date  04/05/19      OT SHORT TERM GOAL #2   Title  Pt will demonstrate improved fine motor coordination for ADLs as evidenced by decreasing 9 hole peg test score to 50 secs or less.    Baseline  RUE 20.56, LUE 56.87    Time  4    Period  Weeks    Status  Achieved   04/08/19:  37.81sec     OT SHORT TERM GOAL #3   Title  Pt will demonstrate ability to retrieve a lightweight object with LUE at 110*sh. flexion  for increased functional reach.    Baseline  L sh. flexion 105    Time  4    Period  Weeks    Status  Achieved   04/08/19:  145*     OT SHORT TERM GOAL #4   Title  Pt will perform basic cooking with supervision and min v.c    Time  4    Period  Weeks    Status  Partially Met   Pt reports doing simple cooking but needs help w/ putting things on and taking off stove (for safety)     OT SHORT TERM GOAL #5   Title  Pt will perform environmental  scanning with 80% or better accuracy.    Time  4    Period  Weeks    Status  Achieved   04/08/19:  Simple environmental scanning in min distracting environment with 100% accuracy       OT Long Term Goals - 04/20/19 1354      OT LONG TERM GOAL #1   Title  I with updated HEP.    Time  12    Period  Weeks    Status  On-going  OT LONG TERM GOAL #2   Title  Pt will demonstrate improved fine motor coordination for IADLS  as evidenced  by decreasing 9 hole peg test score to 35 secs or less.    Baseline  LUE 56.87 secs    Time  12    Period  Weeks    Status  Revised      OT LONG TERM GOAL #3   Title  Pt will demonstrate ability to retrieve a 1 lbs object from overhead shelf at 115* with good control.    Baseline  L sh. flex 105    Time  12    Period  Weeks    Status  Achieved      OT LONG TERM GOAL #4   Title  Pt will resume perfromance of mod complex cooking and home management modified independently.    Time  12    Period  Weeks    Status  On-going      OT LONG TERM GOAL #5   Title  Pt will perfrom environmental scanning in a busy environment with 90% or better accuracy.    Time  12    Period  Weeks    Status  On-going      OT LONG TERM GOAL #6   Title  Pt will demonstrate ability to perfrom a physical and cognitive task simultaneously with 90% or better accuracy in prep for return to driving.    Time  12    Period  Weeks    Status  On-going            Plan - 04/20/19 1349    Clinical Impression Statement   Pt has met 4/5 STG's and LTG #3. Pt has improved in coordination, functional mobility, and visual scanning.    Occupational performance deficits (Please refer to evaluation for details):  ADL's;IADL's;Leisure;Social Participation    Body Structure / Function / Physical Skills  ADL;Balance;Mobility;Strength;UE functional use;FMC;Coordination;Gait;Vision;ROM;GMC;Decreased knowledge of precautions;Decreased knowledge of use of DME;IADL;Dexterity;Sensation     Cognitive Skills  Attention;Safety Awareness;Problem Solve    Rehab Potential  Good    OT Frequency  2x / week    OT Duration  12 weeks    OT Treatment/Interventions  Self-care/ADL training;Therapeutic exercise;Balance training;Functional Mobility Training;Manual Therapy;Neuromuscular education;Ultrasound;Aquatic Therapy;Therapeutic activities;Cognitive remediation/compensation;DME and/or AE instruction;Paraffin;Cryotherapy;Fluidtherapy;Visual/perceptual remediation/compensation;Patient/family education;Passive range of motion;Moist Heat;Electrical Stimulation    Plan  10th progress note due! LUE coordination/ functional use, add divided/ alternating attention to activities for increased challenge       Patient will benefit from skilled therapeutic intervention in order to improve the following deficits and impairments:   Body Structure / Function / Physical Skills: ADL, Balance, Mobility, Strength, UE functional use, FMC, Coordination, Gait, Vision, ROM, GMC, Decreased knowledge of precautions, Decreased knowledge of use of DME, IADL, Dexterity, Sensation Cognitive Skills: Attention, Safety Awareness, Problem Solve     Visit Diagnosis: Other lack of coordination  Visuospatial deficit    Problem List Patient Active Problem List   Diagnosis Date Noted  . Acute bilateral deep vein thrombosis (DVT) of popliteal veins (HCC) 12/26/2018  . Labile blood glucose   . Acute deep vein thrombosis (DVT) of tibial vein of left lower extremity ([place])   . Neurogenic bladder   . Slow transit constipation   . Acute deep vein thrombosis (DVT) of left tibial vein (H[facility])   . Acute left hemiparesis ([place]) 12/02/2018  . Hyperlipemia 12/02/2018  . Prediabetes 12/02/2018  . Morbid obesity ([place]) 12/02/2018  . Chronic  back pain 12/02/2018  . Urinary retention 12/02/2018  . Acute ischemic right MCA stroke ([place]) 12/02/2018  . Acute ischemic stroke ([place]) large R MCA and 2 L infarcts s/p tPA and mechanical  thrombectomy 11/29/2018  . 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 S/P right knee arthroscopy 07/09/2017  . S/P knee surgery 01/10/2017    Carey Bullocks, OTR/L 04/20/2019, 2:08 PM  [facility] [address] [place], [place], [address] Phone: [phone]   Fax:  [phone]  Name: Deanna [name] MRN: [id number] Date of Birth: 08/19/1950

## 2019-04-22 ENCOUNTER — Encounter: Payer: Self-pay | Admitting: Occupational Therapy

## 2019-04-22 ENCOUNTER — Ambulatory Visit: Payer: Medicare Other | Admitting: Occupational Therapy

## 2019-04-22 ENCOUNTER — Encounter: Payer: Self-pay | Admitting: Physical Therapy

## 2019-04-22 ENCOUNTER — Ambulatory Visit: Payer: Medicare Other | Admitting: Physical Therapy

## 2019-04-22 ENCOUNTER — Other Ambulatory Visit: Payer: Self-pay

## 2019-04-22 DIAGNOSIS — I69315 Cognitive social or emotional deficit following cerebral infarction: Secondary | ICD-10-CM

## 2019-04-22 DIAGNOSIS — R278 Other lack of coordination: Secondary | ICD-10-CM

## 2019-04-22 DIAGNOSIS — I69354 Hemiplegia and hemiparesis following cerebral infarction affecting left non-dominant side: Secondary | ICD-10-CM

## 2019-04-22 DIAGNOSIS — R41842 Visuospatial deficit: Secondary | ICD-10-CM

## 2019-04-22 DIAGNOSIS — M6281 Muscle weakness (generalized): Secondary | ICD-10-CM

## 2019-04-22 DIAGNOSIS — R2689 Other abnormalities of gait and mobility: Secondary | ICD-10-CM

## 2019-04-22 NOTE — Therapy (Signed)
[facility] [address] [place], [place], [address] Phone: [phone]   Fax:  [phone]  Occupational Therapy Treatment  Patient Details  Name: Deanna [name] MRN: [id number] Date of Birth: Apr 13, 1951 Referring Provider (OT): Dr. Letta Pate   Encounter Date: 04/22/2019  10TH VISIT PROGRESS NOTE: Dates of service 03/06/19-04/22/19  OT End of Session - 04/22/19 1521    Visit Number  10    Number of Visits  24    Date for OT Re-Evaluation  06/04/19    Authorization Type  Medicare, [employer]    Authorization Time Period  anticipate earlier discharge based on progress    Authorization - Visit Number  10    Authorization - Number of Visits  20    OT Start Time  1403    OT Stop Time  1446    OT Time Calculation (min)  43 min    Activity Tolerance  Patient tolerated treatment well    Behavior During Therapy  [facility] for tasks assessed/performed       Past Medical History:  Diagnosis Date  . Arthritis    osteoarthritis in  both knees  . Asthma   . Difficult intravenous access   . GERD (gastroesophageal reflux disease)   . Heart murmur    born with years ago  . Pre-diabetes    last A1C=6.0 per pt  . Torn meniscus    left knee w fracture x2 to left knee  . Wears glasses     Past Surgical History:  Procedure Laterality Date  . BUBBLE STUDY  12/02/2018   Procedure: BUBBLE STUDY;  Surgeon: Elouise Munroe, MD;  Location: [place];  Service: Cardiology;;  . CHOLECYSTECTOMY    . CHONDROPLASTY Right 07/09/2017   Procedure: CHONDROPLASTY;  Surgeon: Sydnee Cabal, MD;  Location: [facility];  Service: Orthopedics;  Laterality: Right;  . HERNIA REPAIR Right age 68   inguinal  . IR CT HEAD LTD  11/29/2018  . IR PERCUTANEOUS ART THROMBECTOMY/INFUSION INTRACRANIAL INC DIAG ANGIO  11/29/2018  . KNEE ARTHROSCOPY WITH MEDIAL MENISECTOMY Left 01/10/2017   Procedure: LEFT KNEE ARTHROSCOPY, DEBRIDEMENT,  PARTIAL MEDIAL MENISCECTOMY, CHONDROPLASTY, ARTHROSCOPIC ASSISTED INTERNAL FIXATION OF MEDIAL FEMORAL CONDYLE AND MEDIAL TIBIAL PLATEAU INSUFFICIENCY FRACTURES;  Surgeon: Sydnee Cabal, MD;  Location: [place];  Service: Orthopedics;  Laterality: Left;  . KNEE ARTHROSCOPY WITH MEDIAL MENISECTOMY Right 07/09/2017   Procedure: Right knee scope, debridement, chondroplasty, partial meniscectomy, internal arthroscopic assisted internal fixation of medial tibial plateau fracture;  Surgeon: Sydnee Cabal, MD;  Location: [facility]  Service: Orthopedics;  Laterality: Right;  . LOOP RECORDER INSERTION N/A 12/02/2018   Procedure: LOOP RECORDER INSERTION;  Surgeon: Evans Lance, MD;  Location: [facility];  Service: Cardiovascular;  Laterality: N/A;  . RADIOLOGY WITH ANESTHESIA N/A 11/29/2018   Procedure: RADIOLOGY WITH ANESTHESIA;  Surgeon: Luanne Bras, MD;  Location: [facility];  Service: Radiology;  Laterality: N/A;  . TEE WITHOUT CARDIOVERSION N/A 12/02/2018   Procedure: TRANSESOPHAGEAL ECHOCARDIOGRAM (TEE);  Surgeon: Elouise Munroe, MD;  Location: [place];  Service: Cardiology;  Laterality: N/A;  . TUBAL LIGATION  age 68    There were no vitals filed for this visit.  Subjective Assessment - 04/22/19 1406    Subjective   I took my sheets out of the dryer and folded them, I emptied the diswasher    Currently in Pain?  No/denies    Pain Score  0-No  pain         [facility] OT Assessment - 04/22/19 0001      Coordination   Left 9 Hole Peg Test  32.12               OT Treatments/Exercises (OP) - 04/22/19 0001      ADLs   Grooming  Patient is able now to effectively use her curling iron.      Cooking  Patient has been cooking with her husband.  Her husband will lift heavier items.  Patient unable yet to transport items from stovetop to sink across open floorplan.  Patient has been using BUE for peeling, chopping, stirring, etc.         Visual/Perceptual Exercises   Other Exercises  Visual scanning in environment to locate and order items in logical sequence.  Patient able to reamin up on feet for 10 min without rest break.  Patient able to reach overhead with LUE without loss of balance.  Patient able to reach to feet to locate low items as well.  Min cueing required.                 OT Short Term Goals - 04/22/19 1523      OT SHORT TERM GOAL #1   Title  I with HEP    Status  Achieved      OT SHORT TERM GOAL #2   Title  Pt will demonstrate improved fine motor coordination for ADLs as evidenced by decreasing 9 hole peg test score to 50 secs or less.    Status  Achieved      OT SHORT TERM GOAL #3   Title  Pt will demonstrate ability to retrieve a lightweight object with LUE at 110*sh. flexion  for increased functional reach.    Status  Achieved      OT SHORT TERM GOAL #4   Title  Pt will perform basic cooking with supervision and min v.c    Status  Achieved      OT SHORT TERM GOAL #5   Title  Pt will perform environmental scanning with 80% or better accuracy.    Status  Achieved        OT Long Term Goals - 04/22/19 1523      OT LONG TERM GOAL #1   Title  I with updated HEP.    Status  On-going      OT LONG TERM GOAL #2   Title  Pt will demonstrate improved fine motor coordination for IADLS  as evidenced  by decreasing 9 hole peg test score to 35 secs or less.    Status  Achieved      OT LONG TERM GOAL #3   Title  Pt will demonstrate ability to retrieve a 1 lbs object from overhead shelf at 115* with good control.    Status  Achieved      OT LONG TERM GOAL #4   Title  Pt will resume perfromance of mod complex cooking and home management modified independently.    Status  On-going      OT LONG TERM GOAL #5   Title  Pt will perfrom environmental scanning in a busy environment with 90% or better accuracy.    Status  Achieved      OT LONG TERM GOAL #6   Title  Pt will demonstrate ability to  perfrom a physical and cognitive task simultaneously with 90% or better accuracy in prep for return to driving.  Status  On-going            Plan - 04/22/19 1521    Clinical Impression Statement  Patient is showing excellent progress and anticipate an earlier discharge than initally planned.  Patient is aware, and is extremely pleased with her progress. She reports increased stamina, increased balance, improved ability to return to ADL's.  Patient is highly motivated and she is dilligent with HEP.    OT Frequency  2x / week    OT Duration  12 weeks    OT Treatment/Interventions  Self-care/ADL training;Therapeutic exercise;Balance training;Functional Mobility Training;Manual Therapy;Neuromuscular education;Ultrasound;Aquatic Therapy;Therapeutic activities;Cognitive remediation/compensation;DME and/or AE instruction;Paraffin;Cryotherapy;Fluidtherapy;Visual/perceptual remediation/compensation;Patient/family education;Passive range of motion;Moist Heat;Electrical Stimulation    Plan  LUE coordination/ functional use, add divided/ alternating attention to activities for increased challenge    Consulted and Agree with Plan of Care  Patient       Patient will benefit from skilled therapeutic intervention in order to improve the following deficits and impairments:           Visit Diagnosis: Other lack of coordination  Muscle weakness (generalized)  Visuospatial deficit  Hemiplegia and hemiparesis following cerebral infarction affecting left non-dominant side (HCC)  Cognitive social or emotional deficit following cerebral infarction  Other abnormalities of gait and mobility    Problem List Patient Active Problem List   Diagnosis Date Noted  . Acute bilateral deep vein thrombosis (DVT) of popliteal veins (HCC) 12/26/2018  . Labile blood glucose   . Acute deep vein thrombosis (DVT) of tibial vein of left lower extremity ([place])   . Neurogenic bladder   . Slow transit constipation    . Acute deep vein thrombosis (DVT) of left tibial vein (HCC)   . Acute left hemiparesis ([place]) 12/02/2018  . Hyperlipemia 12/02/2018  . Prediabetes 12/02/2018  . Morbid obesity ([place]) 12/02/2018  . Chronic back pain 12/02/2018  . Urinary retention 12/02/2018  . Acute ischemic right MCA stroke ([place]) 12/02/2018  . Acute ischemic stroke ([place]) large R MCA and 2 L infarcts s/p tPA and mechanical thrombectomy 11/29/2018  . 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 S/P right knee arthroscopy 07/09/2017  . S/P knee surgery 01/10/2017    Mariah Milling, OTR/L 04/22/2019, 3:25 PM  [facility] [address] [place], [place], [address] Phone: [phone]   Fax:  [phone]  Name: Roshni Mccrite MRN: [id number] Date of Birth: 24-May-1950

## 2019-04-24 NOTE — Therapy (Signed)
[facility] [address] [place], [place], [address] Phone: [phone]   Fax:  [phone]  Physical Therapy Treatment  Patient Details  Name: Deanna [name] MRN: [id number] Date of Birth: 1951/01/19 Referring Provider (PT): Charlett Blake, MD   Encounter Date: 04/22/2019   04/22/19 1451  PT Visits / Re-Eval  Visit Number 11  Number of Visits 24  Date for PT Re-Evaluation 06/14/19  Authorization  Authorization Type [employer]  PT Time Calculation  PT Start Time 1446  PT Stop Time 1528  PT Time Calculation (min) 42 min  PT - End of Session  Equipment Utilized During Treatment Gait belt  Activity Tolerance Patient tolerated treatment well  Behavior During Therapy [facility] for tasks assessed/performed     Past Medical History:  Diagnosis Date  . Arthritis    osteoarthritis in  both knees  . Asthma   . Difficult intravenous access   . GERD (gastroesophageal reflux disease)   . Heart murmur    born with years ago  . Pre-diabetes    last A1C=6.0 per pt  . Torn meniscus    left knee w fracture x2 to left knee  . Wears glasses     Past Surgical History:  Procedure Laterality Date  . BUBBLE STUDY  12/02/2018   Procedure: BUBBLE STUDY;  Surgeon: Elouise Munroe, MD;  Location: [facility];  Service: Cardiology;;  . CHOLECYSTECTOMY    . CHONDROPLASTY Right 07/09/2017   Procedure: CHONDROPLASTY;  Surgeon: Sydnee Cabal, MD;  Location: [facility];  Service: Orthopedics;  Laterality: Right;  . HERNIA REPAIR Right age 30   inguinal  . IR CT HEAD LTD  11/29/2018  . IR PERCUTANEOUS ART THROMBECTOMY/INFUSION INTRACRANIAL INC DIAG ANGIO  11/29/2018  . KNEE ARTHROSCOPY WITH MEDIAL MENISECTOMY Left 01/10/2017   Procedure: LEFT KNEE ARTHROSCOPY, DEBRIDEMENT, PARTIAL MEDIAL MENISCECTOMY, CHONDROPLASTY, ARTHROSCOPIC ASSISTED INTERNAL FIXATION OF MEDIAL FEMORAL CONDYLE AND MEDIAL TIBIAL  PLATEAU INSUFFICIENCY FRACTURES;  Surgeon: Sydnee Cabal, MD;  Location: [facility];  Service: Orthopedics;  Laterality: Left;  . KNEE ARTHROSCOPY WITH MEDIAL MENISECTOMY Right 07/09/2017   Procedure: Right knee scope, debridement, chondroplasty, partial meniscectomy, internal arthroscopic assisted internal fixation of medial tibial plateau fracture;  Surgeon: Sydnee Cabal, MD;  Location: [facility];  Service: Orthopedics;  Laterality: Right;  . LOOP RECORDER INSERTION N/A 12/02/2018   Procedure: LOOP RECORDER INSERTION;  Surgeon: Evans Lance, MD;  Location: [facility];  Service: Cardiovascular;  Laterality: N/A;  . RADIOLOGY WITH ANESTHESIA N/A 11/29/2018   Procedure: RADIOLOGY WITH ANESTHESIA;  Surgeon: Luanne Bras, MD;  Location: [facility];  Service: Radiology;  Laterality: N/A;  . TEE WITHOUT CARDIOVERSION N/A 12/02/2018   Procedure: TRANSESOPHAGEAL ECHOCARDIOGRAM (TEE);  Surgeon: Elouise Munroe, MD;  Location: [facility];  Service: Cardiology;  Laterality: N/A;  . TUBAL LIGATION  age 43    There were no vitals filed for this visit.     04/22/19 1450  Symptoms/Limitations  Subjective No new complaints. No falls or pain today.  Pertinent History CVA Rt MCA July 2020  Limitations Lifting;Standing;Walking;House hold activities  How long can you stand comfortably? 15 min if she has UE support  How long can you walk comfortably? limited community ambulator with RW, has not been to grocery store.  Patient Stated Goals work in the garden again.  Pain Assessment  Currently in Pain? No/denies  Pain Score 0      04/22/19  1453  Transfers  Transfers Sit to Stand;Stand to Sit  Sit to Stand 5: Supervision;With upper extremity assist;From chair/3-in-1;From bed  Stand to Sit 5: Supervision;With upper extremity assist;To bed;To chair/3-in-1  Ambulation/Gait  Ambulation/Gait Yes  Ambulation/Gait Assistance 4: Min guard;4: Min assist   Ambulation/Gait Assistance Details cues for posture, step length and weight shifting with gait.   Ambulation Distance (Feet) 50 Feet (x1, 40 x1, 20 x1, 50 x1)  Assistive device Small based quad cane  Gait Pattern Step-through pattern;Decreased arm swing - left;Decreased hip/knee flexion - left;Decreased stance time - left;Decreased dorsiflexion - left;Trunk flexed  Ambulation Surface Level;Indoor  High Level Balance  High Level Balance Activities Side stepping;Marching forwards;Marching backwards;Tandem walking (tandem gait fwd/bwd)  High Level Balance Comments on blue mat in parallel bars: 3 laps each with light UE support on bars, cues on posture and ex form/technique. min guard assist for balance.        04/22/19 1505  Balance Exercises: Standing  Rockerboard Anterior/posterior;Lateral;Head turns;EO;EC;30 seconds;10 reps;Intermittent UE support  Balance Beam standing across blue foam beam: fwd stepping to floor/back onto beam, then bwd stepping to floor/back onto beam for 8-10 reps each bil sides with light bil UE support on bars.  cues needed to task, for step length/height and weight shifting.   Balance Exercises: Standing  Rebounder Limitations performed both ways on balance board with no UE support/occasional touch to bars- rocking the board with emphasis on tall posture with EO, progressing to EC; then holding the board steady for EC no head movements, progressing to EC head movements left<>right, up<>down. Min to mod assist with pt mostly exhibiting a posterior lean, able to correct with cues/facilitation.         PT Short Term Goals - 04/15/19 1523      PT SHORT TERM GOAL #1   Title  Pt will be independent with progressive HEP for strengthening, balance and gait to continue to work on at home.    Time  4    Period  Weeks    Status  New    Target Date  05/15/19      PT SHORT TERM GOAL #2   Title  Pt will increase gait speed from 0.18m/s to >0.74m/s for improved gait  safety.    Baseline  0.30m/s on 04/15/2019    Time  4    Period  Weeks    Status  New    Target Date  05/15/19      PT SHORT TERM GOAL #3   Title  Pt will ambulate 92' with quad cane supervision for improved household distances.    Time  4    Period  Weeks    Status  New    Target Date  05/15/19        PT Long Term Goals - 04/15/19 1524      PT LONG TERM GOAL #1   Title  Pt will ambulate >500' on varied surfaces with FWW mod I for all household and short community distances.    Time  8    Period  Weeks    Status  New    Target Date  06/14/19      PT LONG TERM GOAL #2   Title  Pt will increase Berg from 45/56 to 48/56 or higher for improved balance and decreased fall risk.    Baseline  45/56 on 04/15/2019    Time  8    Period  Weeks    Status  New  Target Date  06/14/19         04/22/19 1451  Plan  Clinical Impression Statement Today's skilled session continued to focus on gait with cane, strengthening and balance reactions. No issues other than fatigue reported, rest breaks taken through out the session. The pt is progressing toward goals and should benefit from continued PT to progress toward unmet goals.  Personal Factors and Comorbidities Comorbidity 1  Comorbidities CVA, TKA  Examination-Activity Limitations Carry;Squat;Stairs;Lift;Stand;Locomotion Level;Transfers  Examination-Participation Restrictions Meal Prep;Cleaning;Driving;Laundry;Shop  Pt will benefit from skilled therapeutic intervention in order to improve on the following deficits Abnormal gait;Decreased balance;Decreased coordination;Decreased endurance;Decreased safety awareness;Decreased strength;Difficulty walking;Postural dysfunction;Obesity  Stability/Clinical Decision Making Evolving/Moderate complexity  Rehab Potential Good  PT Frequency 2x / week  PT Duration 8 weeks (may decide to transfer to clinic closer to home in Jan)  PT Treatment/Interventions ADLs/Self Care Home Management;Aquatic  Therapy;Electrical Stimulation;Cryotherapy;Moist Heat;DME Instruction;Gait training;Stair training;Functional mobility training;Therapeutic activities;Therapeutic exercise;Balance training;Neuromuscular re-education;Manual techniques;Orthotic Fit/Training;Passive range of motion;Taping  PT Next Visit Plan continue to work on LE strengthening, balance reactions on compliant surfaces and gait with small base quad cane for short distances.  Pt shooting to transfer to PT clinic closer to home in January if OT ok with that.  PT Home Exercise Plan Access Code: [email or web address] and added ankle 4 way with yellow and standing H/T raises.  Consulted and Agree with Plan of Care Patient          Patient will benefit from skilled therapeutic intervention in order to improve the following deficits and impairments:  Abnormal gait, Decreased balance, Decreased coordination, Decreased endurance, Decreased safety awareness, Decreased strength, Difficulty walking, Postural dysfunction, Obesity  Visit Diagnosis: Muscle weakness (generalized)  Other lack of coordination  Other abnormalities of gait and mobility     Problem List Patient Active Problem List   Diagnosis Date Noted  . Acute bilateral deep vein thrombosis (DVT) of popliteal veins (HCC) 12/26/2018  . Labile blood glucose   . Acute deep vein thrombosis (DVT) of tibial vein of left lower extremity ([place])   . Neurogenic bladder   . Slow transit constipation   . Acute deep vein thrombosis (DVT) of left tibial vein (HCC)   . Acute left hemiparesis ([place]) 12/02/2018  . Hyperlipemia 12/02/2018  . Prediabetes 12/02/2018  . Morbid obesity ([place]) 12/02/2018  . Chronic back pain 12/02/2018  . Urinary retention 12/02/2018  . Acute ischemic right MCA stroke ([place]) 12/02/2018  . Acute ischemic stroke ([place]) large R MCA and 2 L infarcts s/p tPA and mechanical thrombectomy 11/29/2018  . 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 S/P right knee  arthroscopy 07/09/2017  . S/P knee surgery 01/10/2017    Willow Ora, PTA, [facility] Outpatient Neuro [facility] [address] [place], [place] [address] [phone] 04/24/19, 3:46 PM   Name: Deanna [name] MRN: [id number] Date of Birth: 08/22/1950

## 2019-04-27 ENCOUNTER — Ambulatory Visit: Payer: Medicare Other | Admitting: Occupational Therapy

## 2019-04-27 ENCOUNTER — Ambulatory Visit: Payer: Medicare Other

## 2019-04-27 ENCOUNTER — Other Ambulatory Visit: Payer: Self-pay

## 2019-04-27 DIAGNOSIS — M6281 Muscle weakness (generalized): Secondary | ICD-10-CM

## 2019-04-27 DIAGNOSIS — R41842 Visuospatial deficit: Secondary | ICD-10-CM

## 2019-04-27 DIAGNOSIS — I69354 Hemiplegia and hemiparesis following cerebral infarction affecting left non-dominant side: Secondary | ICD-10-CM | POA: Diagnosis not present

## 2019-04-27 DIAGNOSIS — I69315 Cognitive social or emotional deficit following cerebral infarction: Secondary | ICD-10-CM

## 2019-04-27 DIAGNOSIS — R278 Other lack of coordination: Secondary | ICD-10-CM

## 2019-04-27 DIAGNOSIS — R2689 Other abnormalities of gait and mobility: Secondary | ICD-10-CM

## 2019-04-27 NOTE — Therapy (Signed)
[facility] [address] [place], [place], [address] Phone: [phone]   Fax:  [phone]  Occupational Therapy Treatment  Patient Details  Name: Deanna [name] MRN: [id number] Date of Birth: [birth year] Referring Provider (OT): Dr. Letta Pate   Encounter Date: 04/27/2019  OT End of Session - 04/27/19 1409    Visit Number  11    Number of Visits  24    Date for OT Re-Evaluation  06/04/19    Authorization Type  Medicare, [employer]    Authorization Time Period  anticipate earlier discharge based on progress    Authorization - Visit Number  11    Authorization - Number of Visits  20    OT Start Time  1315    OT Stop Time  1400    OT Time Calculation (min)  45 min    Activity Tolerance  Patient tolerated treatment well    Behavior During Therapy  [facility] for tasks assessed/performed       Past Medical History:  Diagnosis Date  . Arthritis    osteoarthritis in  both knees  . Asthma   . Difficult intravenous access   . GERD (gastroesophageal reflux disease)   . Heart murmur    born with years ago  . Pre-diabetes    last A1C=6.0 per pt  . Torn meniscus    left knee w fracture x2 to left knee  . Wears glasses     Past Surgical History:  Procedure Laterality Date  . BUBBLE STUDY  12/02/2018   Procedure: BUBBLE STUDY;  Surgeon: Elouise Munroe, MD;  Location: [facility];  Service: Cardiology;;  . CHOLECYSTECTOMY    . CHONDROPLASTY Right 07/09/2017   Procedure: CHONDROPLASTY;  Surgeon: Sydnee Cabal, MD;  Location: [facility];  Service: Orthopedics;  Laterality: Right;  . HERNIA REPAIR Right age 85   inguinal  . IR CT HEAD LTD  11/29/2018  . IR PERCUTANEOUS ART THROMBECTOMY/INFUSION INTRACRANIAL INC DIAG ANGIO  11/29/2018  . KNEE ARTHROSCOPY WITH MEDIAL MENISECTOMY Left 01/10/2017   Procedure: LEFT KNEE ARTHROSCOPY, DEBRIDEMENT, PARTIAL MEDIAL MENISCECTOMY, CHONDROPLASTY, ARTHROSCOPIC  ASSISTED INTERNAL FIXATION OF MEDIAL FEMORAL CONDYLE AND MEDIAL TIBIAL PLATEAU INSUFFICIENCY FRACTURES;  Surgeon: Sydnee Cabal, MD;  Location: [place];  Service: Orthopedics;  Laterality: Left;  . KNEE ARTHROSCOPY WITH MEDIAL MENISECTOMY Right 07/09/2017   Procedure: Right knee scope, debridement, chondroplasty, partial meniscectomy, internal arthroscopic assisted internal fixation of medial tibial plateau fracture;  Surgeon: Sydnee Cabal, MD;  Location: [facility];  Service: Orthopedics;  Laterality: Right;  . LOOP RECORDER INSERTION N/A 12/02/2018   Procedure: LOOP RECORDER INSERTION;  Surgeon: Evans Lance, MD;  Location: [facility];  Service: Cardiovascular;  Laterality: N/A;  . RADIOLOGY WITH ANESTHESIA N/A 11/29/2018   Procedure: RADIOLOGY WITH ANESTHESIA;  Surgeon: Luanne Bras, MD;  Location: [facility];  Service: Radiology;  Laterality: N/A;  . TEE WITHOUT CARDIOVERSION N/A 12/02/2018   Procedure: TRANSESOPHAGEAL ECHOCARDIOGRAM (TEE);  Surgeon: Elouise Munroe, MD;  Location: [facility];  Service: Cardiology;  Laterality: N/A;  . TUBAL LIGATION  age 48    There were no vitals filed for this visit.  Subjective Assessment - 04/27/19 1316    Pertinent History  68 year old female with history of prediabetes, OA bilateral knees, obesity who was admitted on 11/29/2018 with left-sided tingling and left lower extremity weakness with right gaze preference.  CTA head neck with perfusion showed proximal right M2  occlusion with associated right MCA infarct with penumbra and moderate cervical artery atherosclerosis.  She underwent cerebral angiogram with endovascular revascularization of right MCA  Follow-up MRI brain showed moderate acute right-MCA infarct with petechial hemorrhage in 1 of 2 acute left frontoparietal infarcts.    On 07/20, she had increasing left-sided weakness with nausea and follow-up CT head showed large edematous infarct in right  MCA with substantial increase in hypodensity and new hemorrhagic conversion in deep white matter of posterior frontal and temporal lobes    Patient Stated Goals  improve use of LUE    Currently in Pain?  No/denies       Practiced functional mobility w/ quad cane and performing cognitive task for divided attention - pt noted to drag Lt foot more w/ challenging cognitive task (subtracting by 7's w/ mod difficulty/errors).  Pt standing while attempting simple gross motor task LUE however, pt w/ max drops tossing ball. Therefore worked on fine motor tasks seated Lt hand while performing category generation (flipping cards, stacking pennies, etc) w/ max difficulty Lt hand stacking pennies.  Functional mobility while carrying cup 1/2 full of water w/ 2 spills, then w/ plate of food w/ no drops.                       OT Short Term Goals - 04/22/19 1523      OT SHORT TERM GOAL #1   Title  I with HEP    Status  Achieved      OT SHORT TERM GOAL #2   Title  Pt will demonstrate improved fine motor coordination for ADLs as evidenced by decreasing 9 hole peg test score to 50 secs or less.    Status  Achieved      OT SHORT TERM GOAL #3   Title  Pt will demonstrate ability to retrieve a lightweight object with LUE at 110*sh. flexion  for increased functional reach.    Status  Achieved      OT SHORT TERM GOAL #4   Title  Pt will perform basic cooking with supervision and min v.c    Status  Achieved      OT SHORT TERM GOAL #5   Title  Pt will perform environmental scanning with 80% or better accuracy.    Status  Achieved        OT Long Term Goals - 04/22/19 1523      OT LONG TERM GOAL #1   Title  I with updated HEP.    Status  On-going      OT LONG TERM GOAL #2   Title  Pt will demonstrate improved fine motor coordination for IADLS  as evidenced  by decreasing 9 hole peg test score to 35 secs or less.    Status  Achieved      OT LONG TERM GOAL #3   Title  Pt will  demonstrate ability to retrieve a 1 lbs object from overhead shelf at 115* with good control.    Status  Achieved      OT LONG TERM GOAL #4   Title  Pt will resume perfromance of mod complex cooking and home management modified independently.    Status  On-going      OT LONG TERM GOAL #5   Title  Pt will perfrom environmental scanning in a busy environment with 90% or better accuracy.    Status  Achieved      OT LONG TERM GOAL #6  Title  Pt will demonstrate ability to perfrom a physical and cognitive task simultaneously with 90% or better accuracy in prep for return to driving.    Status  On-going            Plan - 04/27/19 1409    Clinical Impression Statement  Pt progressing towards all functional goals. Pt does demo decreased motor skills w/ challenging cognitive tasks.    Occupational performance deficits (Please refer to evaluation for details):  ADL's;IADL's;Leisure;Social Participation    Body Structure / Function / Physical Skills  ADL;Balance;Mobility;Strength;UE functional use;FMC;Coordination;Gait;Vision;ROM;GMC;Decreased knowledge of precautions;Decreased knowledge of use of DME;IADL;Dexterity;Sensation    Cognitive Skills  Attention;Safety Awareness;Problem Solve    Rehab Potential  Good    OT Frequency  2x / week    OT Duration  12 weeks    OT Treatment/Interventions  Self-care/ADL training;Therapeutic exercise;Balance training;Functional Mobility Training;Manual Therapy;Neuromuscular education;Ultrasound;Aquatic Therapy;Therapeutic activities;Cognitive remediation/compensation;DME and/or AE instruction;Paraffin;Cryotherapy;Fluidtherapy;Visual/perceptual remediation/compensation;Patient/family education;Passive range of motion;Moist Heat;Electrical Stimulation    Plan  LUE coordination/ functional use, add divided/ alternating attention to activities for increased challenge    Consulted and Agree with Plan of Care  Patient       Patient will benefit from skilled  therapeutic intervention in order to improve the following deficits and impairments:   Body Structure / Function / Physical Skills: ADL, Balance, Mobility, Strength, UE functional use, FMC, Coordination, Gait, Vision, ROM, GMC, Decreased knowledge of precautions, Decreased knowledge of use of DME, IADL, Dexterity, Sensation Cognitive Skills: Attention, Safety Awareness, Problem Solve     Visit Diagnosis: Other lack of coordination  Hemiplegia and hemiparesis following cerebral infarction affecting left non-dominant side (HCC)  Visuospatial deficit  Cognitive social or emotional deficit following cerebral infarction    Problem List Patient Active Problem List   Diagnosis Date Noted  . Acute bilateral deep vein thrombosis (DVT) of popliteal veins (HCC) 12/26/2018  . Labile blood glucose   . Acute deep vein thrombosis (DVT) of tibial vein of left lower extremity ([place])   . Neurogenic bladder   . Slow transit constipation   . Acute deep vein thrombosis (DVT) of left tibial vein (HCC)   . Acute left hemiparesis ([place]) 12/02/2018  . Hyperlipemia 12/02/2018  . Prediabetes 12/02/2018  . Morbid obesity ([place]) 12/02/2018  . Chronic back pain 12/02/2018  . Urinary retention 12/02/2018  . Acute ischemic right MCA stroke ([place]) 12/02/2018  . Acute ischemic stroke ([place]) large R MCA and 2 L infarcts s/p tPA and mechanical thrombectomy 11/29/2018  . 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 S/P right knee arthroscopy 07/09/2017  . S/P knee surgery 01/10/2017    Carey Bullocks, OTR/L 04/27/2019, 2:11 PM  [facility] [address] [place], [place], [address] Phone: [phone]   Fax:  [phone]  Name: Deanna [name] MRN: [id number] Date of Birth: 12-Apr-1951

## 2019-04-27 NOTE — Therapy (Signed)
[facility] [address] [place], [place], [address] Phone: [phone]   Fax:  [phone]  Physical Therapy Treatment  Patient Details  Name: Deanna [name] MRN: [id number] Date of Birth: 02-07-1951 Referring Provider (PT): Letta Pate Luanna Salk, MD   Encounter Date: 04/27/2019  PT End of Session - 04/27/19 1356    Visit Number  12    Number of Visits  24    Date for PT Re-Evaluation  06/14/19    Authorization Type  [employer]    PT Start Time  1400    PT Stop Time  1441    PT Time Calculation (min)  41 min    Equipment Utilized During Treatment  Gait belt    Activity Tolerance  Patient tolerated treatment well    Behavior During Therapy  [facility] for tasks assessed/performed       Past Medical History:  Diagnosis Date  . Arthritis    osteoarthritis in  both knees  . Asthma   . Difficult intravenous access   . GERD (gastroesophageal reflux disease)   . Heart murmur    born with years ago  . Pre-diabetes    last A1C=6.0 per pt  . Torn meniscus    left knee w fracture x2 to left knee  . Wears glasses     Past Surgical History:  Procedure Laterality Date  . BUBBLE STUDY  12/02/2018   Procedure: BUBBLE STUDY;  Surgeon: Elouise Munroe, MD;  Location: [facility];  Service: Cardiology;;  . CHOLECYSTECTOMY    . CHONDROPLASTY Right 07/09/2017   Procedure: CHONDROPLASTY;  Surgeon: Sydnee Cabal, MD;  Location: [facility];  Service: Orthopedics;  Laterality: Right;  . HERNIA REPAIR Right age 21   inguinal  . IR CT HEAD LTD  11/29/2018  . IR PERCUTANEOUS ART THROMBECTOMY/INFUSION INTRACRANIAL INC DIAG ANGIO  11/29/2018  . KNEE ARTHROSCOPY WITH MEDIAL MENISECTOMY Left 01/10/2017   Procedure: LEFT KNEE ARTHROSCOPY, DEBRIDEMENT, PARTIAL MEDIAL MENISCECTOMY, CHONDROPLASTY, ARTHROSCOPIC ASSISTED INTERNAL FIXATION OF MEDIAL FEMORAL CONDYLE AND MEDIAL TIBIAL PLATEAU INSUFFICIENCY  FRACTURES;  Surgeon: Sydnee Cabal, MD;  Location: [facility];  Service: Orthopedics;  Laterality: Left;  . KNEE ARTHROSCOPY WITH MEDIAL MENISECTOMY Right 07/09/2017   Procedure: Right knee scope, debridement, chondroplasty, partial meniscectomy, internal arthroscopic assisted internal fixation of medial tibial plateau fracture;  Surgeon: Sydnee Cabal, MD;  Location: [facility];  Service: Orthopedics;  Laterality: Right;  . LOOP RECORDER INSERTION N/A 12/02/2018   Procedure: LOOP RECORDER INSERTION;  Surgeon: Evans Lance, MD;  Location: [facility];  Service: Cardiovascular;  Laterality: N/A;  . RADIOLOGY WITH ANESTHESIA N/A 11/29/2018   Procedure: RADIOLOGY WITH ANESTHESIA;  Surgeon: Luanne Bras, MD;  Location: [facility];  Service: Radiology;  Laterality: N/A;  . TEE WITHOUT CARDIOVERSION N/A 12/02/2018   Procedure: TRANSESOPHAGEAL ECHOCARDIOGRAM (TEE);  Surgeon: Elouise Munroe, MD;  Location: [facility];  Service: Cardiology;  Laterality: N/A;  . TUBAL LIGATION  age 68    There were no vitals filed for this visit.  Subjective Assessment - 04/27/19 1357    Subjective  Pt reports she is doing the same. Feels she is doing a little more around the house.    Pertinent History  CVA Rt MCA July 2020    Limitations  Lifting;Standing;Walking;House hold activities    How long can you stand comfortably?  15 min if she has UE support    How long can you  walk comfortably?  limited community ambulator with RW, has not been to grocery store.    Patient Stated Goals  work in the garden again.    Currently in Pain?  No/denies                       [facility] Adult PT Treatment/Exercise - 04/27/19 1400      Ambulation/Gait   Ambulation/Gait  Yes    Ambulation/Gait Assistance  4: Min guard    Ambulation/Gait Assistance Details  Verbal cues to try to increase left foot clearance    Ambulation Distance (Feet)  115 Feet    Assistive device   Small based quad cane    Gait Pattern  Step-through pattern;Decreased hip/knee flexion - left;Decreased dorsiflexion - left    Ambulation Surface  Level;Indoor      Neuro Re-ed    Neuro Re-ed Details   In // bars: marching gait with 1 UE support 6' x 6, sidestepping 6' x 6. Step-ups on 4" step x 10 forward and x 10 lateral with left Deanna.  Reciprocal steps over 3 2x4" foams with 1 UE support x 8. Pt needed max cuing for technique. Pt caught left toes at times. Stepping over and back foam x 10 each leg with 1 UE support. Standing on 2x4" foam for working on ankle stategy 30 sec with occasional UE support, standing on 2x 6" foam maintaining 30 sec x 2 with reaching during 2nd bout. Pt on airex without UE support x 30 sec eyes open and x 30 sec eyes closed CGA. Marching on foam x 10 with cues to watch left ankle control.              PT Education - 04/27/19 1610    Education Details  Pt to continue with current HEP    Person(s) Educated  Patient    Methods  Explanation    Comprehension  Verbalized understanding       PT Short Term Goals - 04/15/19 1523      PT SHORT TERM GOAL #1   Title  Pt will be independent with progressive HEP for strengthening, balance and gait to continue to work on at home.    Time  4    Period  Weeks    Status  New    Target Date  05/15/19      PT SHORT TERM GOAL #2   Title  Pt will increase gait speed from 0.16m/s to >0.86m/s for improved gait safety.    Baseline  0.30m/s on 04/15/2019    Time  4    Period  Weeks    Status  New    Target Date  05/15/19      PT SHORT TERM GOAL #3   Title  Pt will ambulate 29' with quad cane supervision for improved household distances.    Time  4    Period  Weeks    Status  New    Target Date  05/15/19        PT Long Term Goals - 04/15/19 1524      PT LONG TERM GOAL #1   Title  Pt will ambulate >500' on varied surfaces with FWW mod I for all household and short community distances.    Time  8    Period  Weeks     Status  New    Target Date  06/14/19      PT LONG TERM GOAL #2  Title  Pt will increase Berg from 45/56 to 48/56 or higher for improved balance and decreased fall risk.    Baseline  45/56 on 04/15/2019    Time  8    Period  Weeks    Status  New    Target Date  06/14/19            Plan - 04/27/19 1611    Clinical Impression Statement  Pt had no LOB with gait today but does have decreased left foot clearance at times. Pt noted to have pelvic drop during left stance so focused more on left hip strengthening today.    Personal Factors and Comorbidities  Comorbidity 1    Comorbidities  CVA, TKA    Examination-Activity Limitations  Carry;Squat;Stairs;Lift;Stand;Locomotion Level;Transfers    Examination-Participation Restrictions  Meal Prep;Cleaning;Driving;Laundry;Shop    Stability/Clinical Decision Making  Evolving/Moderate complexity    Rehab Potential  Good    PT Frequency  2x / week    PT Duration  8 weeks   may decide to transfer to clinic closer to home in Jan   PT Treatment/Interventions  ADLs/Self Care Home Management;Aquatic Therapy;Electrical Stimulation;Cryotherapy;Moist Heat;DME Instruction;Gait training;Stair training;Functional mobility training;Therapeutic activities;Therapeutic exercise;Balance training;Neuromuscular re-education;Manual techniques;Orthotic Fit/Training;Passive range of motion;Taping    PT Next Visit Plan  did her new sneakers come in? continue to work on Deanna strengthening, balance reactions on compliant surfaces and gait with small base quad cane for short distances.  Pt shooting to transfer to PT clinic closer to home in January if OT ok with that.    PT Home Exercise Plan  Access Code: [email or web address] and added ankle 4 way with yellow and standing H/T raises.    Consulted and Agree with Plan of Care  Patient       Patient will benefit from skilled therapeutic intervention in order to improve the following deficits and impairments:  Abnormal gait, Decreased  balance, Decreased coordination, Decreased endurance, Decreased safety awareness, Decreased strength, Difficulty walking, Postural dysfunction, Obesity  Visit Diagnosis: Muscle weakness (generalized)  Other abnormalities of gait and mobility     Problem List Patient Active Problem List   Diagnosis Date Noted  . Acute bilateral deep vein thrombosis (DVT) of popliteal veins (HCC) 12/26/2018  . Labile blood glucose   . Acute deep vein thrombosis (DVT) of tibial vein of left lower extremity ([place])   . Neurogenic bladder   . Slow transit constipation   . Acute deep vein thrombosis (DVT) of left tibial vein (HCC)   . Acute left hemiparesis ([place]) 12/02/2018  . Hyperlipemia 12/02/2018  . Prediabetes 12/02/2018  . Morbid obesity ([place]) 12/02/2018  . Chronic back pain 12/02/2018  . Urinary retention 12/02/2018  . Acute ischemic right MCA stroke ([place]) 12/02/2018  . Acute ischemic stroke ([place]) large R MCA and 2 L infarcts s/p tPA and mechanical thrombectomy 11/29/2018  . 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 S/P right knee arthroscopy 07/09/2017  . S/P knee surgery 01/10/2017    Electa Sniff, PT, DPT, NCS 04/27/2019, 4:17 PM  [facility] [address] [place], [place], [address] Phone: [phone]   Fax:  [phone]  Name: Deanna [name] MRN: [id number] Date of Birth: [birth year]

## 2019-04-29 ENCOUNTER — Other Ambulatory Visit: Payer: Self-pay

## 2019-04-29 ENCOUNTER — Ambulatory Visit: Payer: Medicare Other

## 2019-04-29 ENCOUNTER — Ambulatory Visit: Payer: Medicare Other | Admitting: Occupational Therapy

## 2019-04-29 DIAGNOSIS — I69315 Cognitive social or emotional deficit following cerebral infarction: Secondary | ICD-10-CM

## 2019-04-29 DIAGNOSIS — M6281 Muscle weakness (generalized): Secondary | ICD-10-CM

## 2019-04-29 DIAGNOSIS — R41842 Visuospatial deficit: Secondary | ICD-10-CM

## 2019-04-29 DIAGNOSIS — R2689 Other abnormalities of gait and mobility: Secondary | ICD-10-CM

## 2019-04-29 DIAGNOSIS — I69354 Hemiplegia and hemiparesis following cerebral infarction affecting left non-dominant side: Secondary | ICD-10-CM

## 2019-04-29 DIAGNOSIS — R278 Other lack of coordination: Secondary | ICD-10-CM

## 2019-04-29 NOTE — Therapy (Signed)
[facility] [address] [place], [place], [address] Phone: [phone]   Fax:  [phone]  Physical Therapy Treatment  Patient Details  Name: Deanna [name] MRN: [id number] Date of Birth: 1950/05/19 Referring Provider (PT): Letta Pate Luanna Salk, MD   Encounter Date: 04/29/2019  PT End of Session - 04/29/19 1454    Visit Number  13    Number of Visits  24    Date for PT Re-Evaluation  06/14/19    Authorization Type  [employer]    PT Start Time  1450    PT Stop Time  1528    PT Time Calculation (min)  38 min    Equipment Utilized During Treatment  Gait belt    Activity Tolerance  Patient tolerated treatment well    Behavior During Therapy  WFL for tasks assessed/performed       Past Medical History:  Diagnosis Date  . Arthritis    osteoarthritis in  both knees  . Asthma   . Difficult intravenous access   . GERD (gastroesophageal reflux disease)   . Heart murmur    born with years ago  . Pre-diabetes    last A1C=6.0 per pt  . Torn meniscus    left knee w fracture x2 to left knee  . Wears glasses     Past Surgical History:  Procedure Laterality Date  . BUBBLE STUDY  12/02/2018   Procedure: BUBBLE STUDY;  Surgeon: Elouise Munroe, MD;  Location: [facility];  Service: Cardiology;;  . CHOLECYSTECTOMY    . CHONDROPLASTY Right 07/09/2017   Procedure: CHONDROPLASTY;  Surgeon: Sydnee Cabal, MD;  Location: [facility];  Service: Orthopedics;  Laterality: Right;  . HERNIA REPAIR Right age 68   inguinal  . IR CT HEAD LTD  11/29/2018  . IR PERCUTANEOUS ART THROMBECTOMY/INFUSION INTRACRANIAL INC DIAG ANGIO  11/29/2018  . KNEE ARTHROSCOPY WITH MEDIAL MENISECTOMY Left 01/10/2017   Procedure: LEFT KNEE ARTHROSCOPY, DEBRIDEMENT, PARTIAL MEDIAL MENISCECTOMY, CHONDROPLASTY, ARTHROSCOPIC ASSISTED INTERNAL FIXATION OF MEDIAL FEMORAL CONDYLE AND MEDIAL TIBIAL PLATEAU INSUFFICIENCY  FRACTURES;  Surgeon: Sydnee Cabal, MD;  Location: [facility];  Service: Orthopedics;  Laterality: Left;  . KNEE ARTHROSCOPY WITH MEDIAL MENISECTOMY Right 07/09/2017   Procedure: Right knee scope, debridement, chondroplasty, partial meniscectomy, internal arthroscopic assisted internal fixation of medial tibial plateau fracture;  Surgeon: Sydnee Cabal, MD;  Location: [facility];  Service: Orthopedics;  Laterality: Right;  . LOOP RECORDER INSERTION N/A 12/02/2018   Procedure: LOOP RECORDER INSERTION;  Surgeon: Evans Lance, MD;  Location: [facility];  Service: Cardiovascular;  Laterality: N/A;  . RADIOLOGY WITH ANESTHESIA N/A 11/29/2018   Procedure: RADIOLOGY WITH ANESTHESIA;  Surgeon: Luanne Bras, MD;  Location: [facility];  Service: Radiology;  Laterality: N/A;  . TEE WITHOUT CARDIOVERSION N/A 12/02/2018   Procedure: TRANSESOPHAGEAL ECHOCARDIOGRAM (TEE);  Surgeon: Elouise Munroe, MD;  Location: [facility];  Service: Cardiology;  Laterality: N/A;  . TUBAL LIGATION  age 68    There were no vitals filed for this visit.  Subjective Assessment - 04/29/19 1455    Subjective  Pt just finished OT. Pt plans to transition to clinic closer to home after this year. Pt got new shoes but did not fit. Has to return and order new ones.    Pertinent History  CVA Rt MCA July 2020    Limitations  Lifting;Standing;Walking;House hold activities    How long can you stand comfortably?  15 min if she has UE support    How long can you walk comfortably?  limited community ambulator with RW, has not been to grocery store.    Patient Stated Goals  work in the garden again.    Currently in Pain?  No/denies                       [facility] Adult PT Treatment/Exercise - 04/29/19 1504      Ambulation/Gait   Ambulation/Gait  Yes    Ambulation/Gait Assistance  4: Min guard;5: Supervision    Ambulation/Gait Assistance Details  Verbal cues to increase left  foot clearance.    Ambulation Distance (Feet)  230 Feet    Assistive device  Small based quad cane    Gait Pattern  Step-through pattern;Decreased hip/knee flexion - left;Decreased stance time - left;Decreased dorsiflexion - left    Ambulation Surface  Level;Indoor      Neuro Re-ed    Neuro Re-ed Details   In // bars: with UE support marching 6' x 3 forwards and backwards, sidestepping  6' x 6, tandem gait 6' x 6, gait with head turns left/right with 1 UE support and then up/down 6' x 4 with 1 UE support. Tandem stance 30 sec x 2 each position, standing feet together without UE support x 30 sec. Close SBA for safety. Pt needed some verbal cuing for correct technique. Pt took a couple seated rest breaks during activities.             PT Education - 04/29/19 1538    Education Details  Pt to continue with current HEP    Person(s) Educated  Patient    Methods  Explanation    Comprehension  Verbalized understanding       PT Short Term Goals - 04/15/19 1523      PT SHORT TERM GOAL #1   Title  Pt will be independent with progressive HEP for strengthening, balance and gait to continue to work on at home.    Time  4    Period  Weeks    Status  New    Target Date  05/15/19      PT SHORT TERM GOAL #2   Title  Pt will increase gait speed from 0.53m/s to >0.48m/s for improved gait safety.    Baseline  0.56m/s on 04/15/2019    Time  4    Period  Weeks    Status  New    Target Date  05/15/19      PT SHORT TERM GOAL #3   Title  Pt will ambulate 9' with quad cane supervision for improved household distances.    Time  4    Period  Weeks    Status  New    Target Date  05/15/19        PT Long Term Goals - 04/15/19 1524      PT LONG TERM GOAL #1   Title  Pt will ambulate >500' on varied surfaces with FWW mod I for all household and short community distances.    Time  8    Period  Weeks    Status  New    Target Date  06/14/19      PT LONG TERM GOAL #2   Title  Pt will  increase Berg from 45/56 to 48/56 or higher for improved balance and decreased fall risk.    Baseline  45/56 on 04/15/2019    Time  8    Period  Weeks    Status  New    Target Date  06/14/19            Plan - 04/29/19 1539    Clinical Impression Statement  Pt did need cuing to increase left foot clearance more with quad cane. No LOB requiring assist but did pause a couple times to regroup. Pt was more challenged with left foot in front with tandem stance.    Personal Factors and Comorbidities  Comorbidity 1    Comorbidities  CVA, TKA    Examination-Activity Limitations  Carry;Squat;Stairs;Lift;Stand;Locomotion Level;Transfers    Examination-Participation Restrictions  Meal Prep;Cleaning;Driving;Laundry;Shop    Stability/Clinical Decision Making  Evolving/Moderate complexity    Rehab Potential  Good    PT Frequency  2x / week    PT Duration  8 weeks   may decide to transfer to clinic closer to home in Jan   PT Treatment/Interventions  ADLs/Self Care Home Management;Aquatic Therapy;Electrical Stimulation;Cryotherapy;Moist Heat;DME Instruction;Gait training;Stair training;Functional mobility training;Therapeutic activities;Therapeutic exercise;Balance training;Neuromuscular re-education;Manual techniques;Orthotic Fit/Training;Passive range of motion;Taping    PT Next Visit Plan  continue to work on LE strengthening, balance reactions on compliant surfaces and gait with small base quad cane for short distances.  Pt will transfer to new PT clinic after next 2 visits.    PT Home Exercise Plan  Access Code: [email or web address] and added ankle 4 way with yellow and standing H/T raises.    Consulted and Agree with Plan of Care  Patient       Patient will benefit from skilled therapeutic intervention in order to improve the following deficits and impairments:  Abnormal gait, Decreased balance, Decreased coordination, Decreased endurance, Decreased safety awareness, Decreased strength, Difficulty walking,  Postural dysfunction, Obesity  Visit Diagnosis: Muscle weakness (generalized)  Other abnormalities of gait and mobility     Problem List Patient Active Problem List   Diagnosis Date Noted  . Acute bilateral deep vein thrombosis (DVT) of popliteal veins (HCC) 12/26/2018  . Labile blood glucose   . Acute deep vein thrombosis (DVT) of tibial vein of left lower extremity ([place])   . Neurogenic bladder   . Slow transit constipation   . Acute deep vein thrombosis (DVT) of left tibial vein (HCC)   . Acute left hemiparesis ([facility]) 12/02/2018  . Hyperlipemia 12/02/2018  . Prediabetes 12/02/2018  . Morbid obesity ([place]) 12/02/2018  . Chronic back pain 12/02/2018  . Urinary retention 12/02/2018  . Acute ischemic right MCA stroke ([place]) 12/02/2018  . Acute ischemic stroke ([place]) large R MCA and 2 L infarcts s/p tPA and mechanical thrombectomy 11/29/2018  . 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 S/P right knee arthroscopy 07/09/2017  . S/P knee surgery 01/10/2017    Electa Sniff, PT, DPT, NCS 04/29/2019, 3:48 PM  [facility] [address] [place], [place], [address] Phone: [phone]   Fax:  [phone]  Name: Rene Paige MRN: [id number] Date of Birth: [birth year]

## 2019-04-29 NOTE — Therapy (Signed)
[facility] [address] [place], [place], [address] Phone: [phone]   Fax:  [phone]  Occupational Therapy Treatment  Patient Details  Name: Deanna [name] MRN: [id number] Date of Birth: [birth year] Referring Provider (OT): Dr. Letta Pate   Encounter Date: 04/29/2019  OT End of Session - 04/29/19 1407    Visit Number  12    Number of Visits  24    Date for OT Re-Evaluation  06/04/19    Authorization Type  Medicare, [employer]    Authorization Time Period  anticipate earlier discharge based on progress    Authorization - Visit Number  12    Authorization - Number of Visits  20    OT Start Time  1406    OT Stop Time  1445    OT Time Calculation (min)  39 min    Activity Tolerance  Patient tolerated treatment well    Behavior During Therapy  [facility] for tasks assessed/performed       Past Medical History:  Diagnosis Date  . Arthritis    osteoarthritis in  both knees  . Asthma   . Difficult intravenous access   . GERD (gastroesophageal reflux disease)   . Heart murmur    born with years ago  . Pre-diabetes    last A1C=6.0 per pt  . Torn meniscus    left knee w fracture x2 to left knee  . Wears glasses     Past Surgical History:  Procedure Laterality Date  . BUBBLE STUDY  12/02/2018   Procedure: BUBBLE STUDY;  Surgeon: Elouise Munroe, MD;  Location: [facility];  Service: Cardiology;;  . CHOLECYSTECTOMY    . CHONDROPLASTY Right 07/09/2017   Procedure: CHONDROPLASTY;  Surgeon: Sydnee Cabal, MD;  Location: [facility];  Service: Orthopedics;  Laterality: Right;  . HERNIA REPAIR Right age 42   inguinal  . IR CT HEAD LTD  11/29/2018  . IR PERCUTANEOUS ART THROMBECTOMY/INFUSION INTRACRANIAL INC DIAG ANGIO  11/29/2018  . KNEE ARTHROSCOPY WITH MEDIAL MENISECTOMY Left 01/10/2017   Procedure: LEFT KNEE ARTHROSCOPY, DEBRIDEMENT, PARTIAL MEDIAL MENISCECTOMY, CHONDROPLASTY, ARTHROSCOPIC  ASSISTED INTERNAL FIXATION OF MEDIAL FEMORAL CONDYLE AND MEDIAL TIBIAL PLATEAU INSUFFICIENCY FRACTURES;  Surgeon: Sydnee Cabal, MD;  Location: [facility];  Service: Orthopedics;  Laterality: Left;  . KNEE ARTHROSCOPY WITH MEDIAL MENISECTOMY Right 07/09/2017   Procedure: Right knee scope, debridement, chondroplasty, partial meniscectomy, internal arthroscopic assisted internal fixation of medial tibial plateau fracture;  Surgeon: Sydnee Cabal, MD;  Location: [facility];  Service: Orthopedics;  Laterality: Right;  . LOOP RECORDER INSERTION N/A 12/02/2018   Procedure: LOOP RECORDER INSERTION;  Surgeon: Evans Lance, MD;  Location: [facility];  Service: Cardiovascular;  Laterality: N/A;  . RADIOLOGY WITH ANESTHESIA N/A 11/29/2018   Procedure: RADIOLOGY WITH ANESTHESIA;  Surgeon: Luanne Bras, MD;  Location: [facility];  Service: Radiology;  Laterality: N/A;  . TEE WITHOUT CARDIOVERSION N/A 12/02/2018   Procedure: TRANSESOPHAGEAL ECHOCARDIOGRAM (TEE);  Surgeon: Elouise Munroe, MD;  Location: [facility];  Service: Cardiology;  Laterality: N/A;  . TUBAL LIGATION  age 29    There were no vitals filed for this visit.  Subjective Assessment - 04/29/19 1517    Subjective   Pt reports cooking more at home    Pertinent History  68 year old female with history of prediabetes, OA bilateral knees, obesity who was admitted on 11/29/2018 with left-sided tingling and left lower extremity weakness with right  gaze preference.  CTA head neck with perfusion showed proximal right M2 occlusion with associated right MCA infarct with penumbra and moderate cervical artery atherosclerosis.  She underwent cerebral angiogram with endovascular revascularization of right MCA  Follow-up MRI brain showed moderate acute right-MCA infarct with petechial hemorrhage in 1 of 2 acute left frontoparietal infarcts.    On 07/20, she had increasing left-sided weakness with nausea and follow-up  CT head showed large edematous infarct in right MCA with substantial increase in hypodensity and new hemorrhagic conversion in deep white matter of posterior frontal and temporal lobes    Patient Stated Goals  improve use of LUE    Currently in Pain?  Yes    Pain Score  2     Pain Location  Knee    Pain Descriptors / Indicators  Aching    Pain Type  Chronic pain    Pain Onset  More than a month ago    Pain Frequency  Intermittent    Aggravating Factors   walking    Pain Relieving Factors  rest               Treatment: Copying small peg design, mod difficulty and increased time, min v.c for correct design. Environmental scanning to locate items in sequential order 80% accuracy and increased time required. Pt reports cooking and performing simple home management.              OT Short Term Goals - 04/22/19 1523      OT SHORT TERM GOAL #1   Title  I with HEP    Status  Achieved      OT SHORT TERM GOAL #2   Title  Pt will demonstrate improved fine motor coordination for ADLs as evidenced by decreasing 9 hole peg test score to 50 secs or less.    Status  Achieved      OT SHORT TERM GOAL #3   Title  Pt will demonstrate ability to retrieve a lightweight object with LUE at 110*sh. flexion  for increased functional reach.    Status  Achieved      OT SHORT TERM GOAL #4   Title  Pt will perform basic cooking with supervision and min v.c    Status  Achieved      OT SHORT TERM GOAL #5   Title  Pt will perform environmental scanning with 80% or better accuracy.    Status  Achieved        OT Long Term Goals - 04/29/19 1409      OT LONG TERM GOAL #1   Title  I with updated HEP.    Status  On-going      OT LONG TERM GOAL #2   Title  Pt will demonstrate improved fine motor coordination for IADLS  as evidenced  by decreasing 9 hole peg test score to 35 secs or less.    Status  Achieved      OT LONG TERM GOAL #3   Title  Pt will demonstrate ability to retrieve a  1 lbs object from overhead shelf at 115* with good control.    Status  Achieved      OT LONG TERM GOAL #4   Title  Pt will resume perfromance of mod complex cooking and home management modified independently.    Status  On-going      OT LONG TERM GOAL #5   Title  Pt will perfrom environmental scanning in a busy environment with 90%  or better accuracy.    Status  Achieved      OT LONG TERM GOAL #6   Title  Pt will demonstrate ability to perfrom a physical and cognitive task simultaneously with 90% or better accuracy in prep for return to driving.    Status  On-going            Plan - 04/29/19 1407    Clinical Impression Statement  Pt progressing towards all functional goals. Pt continues to demonstrate difficulties with higher level cognitive/ visual perceptual tasks.    Occupational performance deficits (Please refer to evaluation for details):  ADL's;IADL's;Leisure;Social Participation    Body Structure / Function / Physical Skills  ADL;Balance;Mobility;Strength;UE functional use;FMC;Coordination;Gait;Vision;ROM;GMC;Decreased knowledge of precautions;Decreased knowledge of use of DME;IADL;Dexterity;Sensation    Cognitive Skills  Attention;Safety Awareness;Problem Solve    Rehab Potential  Good    OT Frequency  2x / week    OT Duration  12 weeks    OT Treatment/Interventions  Self-care/ADL training;Therapeutic exercise;Balance training;Functional Mobility Training;Manual Therapy;Neuromuscular education;Ultrasound;Aquatic Therapy;Therapeutic activities;Cognitive remediation/compensation;DME and/or AE instruction;Paraffin;Cryotherapy;Fluidtherapy;Visual/perceptual remediation/compensation;Patient/family education;Passive range of motion;Moist Heat;Electrical Stimulation    Plan  simple cooking task, start checking goals, anticipate d/c in next 2 visits    Consulted and Agree with Plan of Care  Patient       Patient will benefit from skilled therapeutic intervention in order to  improve the following deficits and impairments:   Body Structure / Function / Physical Skills: ADL, Balance, Mobility, Strength, UE functional use, FMC, Coordination, Gait, Vision, ROM, GMC, Decreased knowledge of precautions, Decreased knowledge of use of DME, IADL, Dexterity, Sensation Cognitive Skills: Attention, Safety Awareness, Problem Solve     Visit Diagnosis: Muscle weakness (generalized)  Other lack of coordination  Hemiplegia and hemiparesis following cerebral infarction affecting left non-dominant side (HCC)  Visuospatial deficit  Cognitive social or emotional deficit following cerebral infarction    Problem List Patient Active Problem List   Diagnosis Date Noted  . Acute bilateral deep vein thrombosis (DVT) of popliteal veins (HCC) 12/26/2018  . Labile blood glucose   . Acute deep vein thrombosis (DVT) of tibial vein of left lower extremity ([place])   . Neurogenic bladder   . Slow transit constipation   . Acute deep vein thrombosis (DVT) of left tibial vein (HCC)   . Acute left hemiparesis ([place]) 12/02/2018  . Hyperlipemia 12/02/2018  . Prediabetes 12/02/2018  . Morbid obesity ([place]) 12/02/2018  . Chronic back pain 12/02/2018  . Urinary retention 12/02/2018  . Acute ischemic right MCA stroke ([place]) 12/02/2018  . Acute ischemic stroke ([place]) large R MCA and 2 L infarcts s/p tPA and mechanical thrombectomy 11/29/2018  . 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 S/P right knee arthroscopy 07/09/2017  . S/P knee surgery 01/10/2017    Domonique Brouillard 04/29/2019, 3:18 PM Theone Murdoch, OTR/L [phone] Phone: [phone] 3:19 PM 04/29/19 [facility] [address] [place], [place], [address] Phone: [phone]   Fax:  [phone]  Name: Deanna [name] MRN: [id number] Date of Birth: 1950/05/22

## 2019-05-01 ENCOUNTER — Other Ambulatory Visit: Payer: Self-pay | Admitting: Physical Medicine and Rehabilitation

## 2019-05-04 ENCOUNTER — Encounter: Payer: Self-pay | Admitting: Occupational Therapy

## 2019-05-04 ENCOUNTER — Other Ambulatory Visit: Payer: Self-pay

## 2019-05-04 ENCOUNTER — Encounter: Payer: Self-pay | Admitting: Physical Therapy

## 2019-05-04 ENCOUNTER — Ambulatory Visit: Payer: Medicare Other | Admitting: Physical Therapy

## 2019-05-04 ENCOUNTER — Ambulatory Visit: Payer: Medicare Other | Admitting: Occupational Therapy

## 2019-05-04 DIAGNOSIS — M6281 Muscle weakness (generalized): Secondary | ICD-10-CM

## 2019-05-04 DIAGNOSIS — I69315 Cognitive social or emotional deficit following cerebral infarction: Secondary | ICD-10-CM

## 2019-05-04 DIAGNOSIS — R278 Other lack of coordination: Secondary | ICD-10-CM

## 2019-05-04 DIAGNOSIS — I69354 Hemiplegia and hemiparesis following cerebral infarction affecting left non-dominant side: Secondary | ICD-10-CM

## 2019-05-04 DIAGNOSIS — R2689 Other abnormalities of gait and mobility: Secondary | ICD-10-CM

## 2019-05-04 DIAGNOSIS — R41842 Visuospatial deficit: Secondary | ICD-10-CM

## 2019-05-04 NOTE — Therapy (Signed)
[facility] [address] [place], [place], [address] Phone: [phone]   Fax:  [phone]  Occupational Therapy Treatment  Patient Details  Name: Deanna [name] MRN: [id number] Date of Birth: 1950-05-17 Referring Provider (OT): Dr. Letta Pate   Encounter Date: 05/04/2019  OT End of Session - 05/04/19 1328    Visit Number  13    Number of Visits  24    Date for OT Re-Evaluation  06/04/19    Authorization Type  Medicare, [employer]    Authorization Time Period  anticipate earlier discharge based on progress    Authorization - Visit Number  13    Authorization - Number of Visits  20    OT Start Time  1325    OT Stop Time  1403    OT Time Calculation (min)  38 min    Activity Tolerance  Patient tolerated treatment well    Behavior During Therapy  [facility] for tasks assessed/performed       Past Medical History:  Diagnosis Date  . Arthritis    osteoarthritis in  both knees  . Asthma   . Difficult intravenous access   . GERD (gastroesophageal reflux disease)   . Heart murmur    born with years ago  . Pre-diabetes    last A1C=6.0 per pt  . Torn meniscus    left knee w fracture x2 to left knee  . Wears glasses     Past Surgical History:  Procedure Laterality Date  . BUBBLE STUDY  12/02/2018   Procedure: BUBBLE STUDY;  Surgeon: Elouise Munroe, MD;  Location: [facility];  Service: Cardiology;;  . CHOLECYSTECTOMY    . CHONDROPLASTY Right 07/09/2017   Procedure: CHONDROPLASTY;  Surgeon: Sydnee Cabal, MD;  Location: [facility];  Service: Orthopedics;  Laterality: Right;  . HERNIA REPAIR Right age 64   inguinal  . IR CT HEAD LTD  11/29/2018  . IR PERCUTANEOUS ART THROMBECTOMY/INFUSION INTRACRANIAL INC DIAG ANGIO  11/29/2018  . KNEE ARTHROSCOPY WITH MEDIAL MENISECTOMY Left 01/10/2017   Procedure: LEFT KNEE ARTHROSCOPY, DEBRIDEMENT, PARTIAL MEDIAL MENISCECTOMY, CHONDROPLASTY, ARTHROSCOPIC  ASSISTED INTERNAL FIXATION OF MEDIAL FEMORAL CONDYLE AND MEDIAL TIBIAL PLATEAU INSUFFICIENCY FRACTURES;  Surgeon: Sydnee Cabal, MD;  Location: [facility];  Service: Orthopedics;  Laterality: Left;  . KNEE ARTHROSCOPY WITH MEDIAL MENISECTOMY Right 07/09/2017   Procedure: Right knee scope, debridement, chondroplasty, partial meniscectomy, internal arthroscopic assisted internal fixation of medial tibial plateau fracture;  Surgeon: Sydnee Cabal, MD;  Location: [facility];  Service: Orthopedics;  Laterality: Right;  . LOOP RECORDER INSERTION N/A 12/02/2018   Procedure: LOOP RECORDER INSERTION;  Surgeon: Evans Lance, MD;  Location: [facility];  Service: Cardiovascular;  Laterality: N/A;  . RADIOLOGY WITH ANESTHESIA N/A 11/29/2018   Procedure: RADIOLOGY WITH ANESTHESIA;  Surgeon: Luanne Bras, MD;  Location: [facility];  Service: Radiology;  Laterality: N/A;  . TEE WITHOUT CARDIOVERSION N/A 12/02/2018   Procedure: TRANSESOPHAGEAL ECHOCARDIOGRAM (TEE);  Surgeon: Elouise Munroe, MD;  Location: [facility];  Service: Cardiology;  Laterality: N/A;  . TUBAL LIGATION  age 56    There were no vitals filed for this visit.  Subjective Assessment - 05/04/19 1326    Subjective   Pt reports that she is cooking more, has done simple cooking on stovetop, doing laundry/dishes, my husband is going to get me a cart to put pots/pans on to move them    Pertinent History  68 year  old female with history of prediabetes, OA bilateral knees, obesity who was admitted on 11/29/2018 with left-sided tingling and left lower extremity weakness with right gaze preference.  CTA head neck with perfusion showed proximal right M2 occlusion with associated right MCA infarct with penumbra and moderate cervical artery atherosclerosis.  She underwent cerebral angiogram with endovascular revascularization of right MCA  Follow-up MRI brain showed moderate acute right-MCA infarct with petechial  hemorrhage in 1 of 2 acute left frontoparietal infarcts.    On 07/20, she had increasing left-sided weakness with nausea and follow-up CT head showed large edematous infarct in right MCA with substantial increase in hypodensity and new hemorrhagic conversion in deep white matter of posterior frontal and temporal lobes    Patient Stated Goals  improve use of LUE    Currently in Pain?  No/denies    Pain Onset  More than a month ago       Simple cooking task (grilled cheese) with good safety; however, pt appeared disorganized at times and had to redirect herself due to internal/external distractions.  Discussed using cart to get all needed items out prior to starting cooking (as pt reports that husband is going to purchase a cart for her).  Also discussed using hot pad/trivet on cart to move small pots/pans.  Placing grooved pegs in pegboard for in-hand manipulation/cognition with mod difficulty.  Functional reaching with LUE to place clothespins with 1-8lb resistance on vertical pole and remove for incr functional use and strength.           OT Short Term Goals - 04/22/19 1523      OT SHORT TERM GOAL #1   Title  I with HEP    Status  Achieved      OT SHORT TERM GOAL #2   Title  Pt will demonstrate improved fine motor coordination for ADLs as evidenced by decreasing 9 hole peg test score to 50 secs or less.    Status  Achieved      OT SHORT TERM GOAL #3   Title  Pt will demonstrate ability to retrieve a lightweight object with LUE at 110*sh. flexion  for increased functional reach.    Status  Achieved      OT SHORT TERM GOAL #4   Title  Pt will perform basic cooking with supervision and min v.c    Status  Achieved      OT SHORT TERM GOAL #5   Title  Pt will perform environmental scanning with 80% or better accuracy.    Status  Achieved        OT Long Term Goals - 04/29/19 1409      OT LONG TERM GOAL #1   Title  I with updated HEP.    Status  On-going      OT LONG TERM  GOAL #2   Title  Pt will demonstrate improved fine motor coordination for IADLS  as evidenced  by decreasing 9 hole peg test score to 35 secs or less.    Status  Achieved      OT LONG TERM GOAL #3   Title  Pt will demonstrate ability to retrieve a 1 lbs object from overhead shelf at 115* with good control.    Status  Achieved      OT LONG TERM GOAL #4   Title  Pt will resume perfromance of mod complex cooking and home management modified independently.    Status  On-going      OT LONG  TERM GOAL #5   Title  Pt will perfrom environmental scanning in a busy environment with 90% or better accuracy.    Status  Achieved      OT LONG TERM GOAL #6   Title  Pt will demonstrate ability to perfrom a physical and cognitive task simultaneously with 90% or better accuracy in prep for return to driving.    Status  On-going            Plan - 05/04/19 1329    Clinical Impression Statement  Pt is progressing toward all functional goals, but continues to demo some difficulty with higher level planning/attention tasks.    Occupational performance deficits (Please refer to evaluation for details):  ADL's;IADL's;Leisure;Social Participation    Body Structure / Function / Physical Skills  ADL;Balance;Mobility;Strength;UE functional use;FMC;Coordination;Gait;Vision;ROM;GMC;Decreased knowledge of precautions;Decreased knowledge of use of DME;IADL;Dexterity;Sensation    Cognitive Skills  Attention;Safety Awareness;Problem Solve    Rehab Potential  Good    OT Frequency  2x / week    OT Duration  12 weeks    OT Treatment/Interventions  Self-care/ADL training;Therapeutic exercise;Balance training;Functional Mobility Training;Manual Therapy;Neuromuscular education;Ultrasound;Aquatic Therapy;Therapeutic activities;Cognitive remediation/compensation;DME and/or AE instruction;Paraffin;Cryotherapy;Fluidtherapy;Visual/perceptual remediation/compensation;Patient/family education;Passive range of motion;Moist  Heat;Electrical Stimulation    Plan  check remaining goals and anticipate d/c    Consulted and Agree with Plan of Care  Patient       Patient will benefit from skilled therapeutic intervention in order to improve the following deficits and impairments:   Body Structure / Function / Physical Skills: ADL, Balance, Mobility, Strength, UE functional use, FMC, Coordination, Gait, Vision, ROM, GMC, Decreased knowledge of precautions, Decreased knowledge of use of DME, IADL, Dexterity, Sensation Cognitive Skills: Attention, Safety Awareness, Problem Solve     Visit Diagnosis: Hemiplegia and hemiparesis following cerebral infarction affecting left non-dominant side (HCC)  Muscle weakness (generalized)  Other abnormalities of gait and mobility  Other lack of coordination  Visuospatial deficit  Cognitive social or emotional deficit following cerebral infarction    Problem List Patient Active Problem List   Diagnosis Date Noted  . Acute bilateral deep vein thrombosis (DVT) of popliteal veins (HCC) 12/26/2018  . Labile blood glucose   . Acute deep vein thrombosis (DVT) of tibial vein of left lower extremity ([place])   . Neurogenic bladder   . Slow transit constipation   . Acute deep vein thrombosis (DVT) of left tibial vein (HCC)   . Acute left hemiparesis ([place]) 12/02/2018  . Hyperlipemia 12/02/2018  . Prediabetes 12/02/2018  . Morbid obesity ([place]) 12/02/2018  . Chronic back pain 12/02/2018  . Urinary retention 12/02/2018  . Acute ischemic right MCA stroke ([place]) 12/02/2018  . Acute ischemic stroke ([place]) large R MCA and 2 L infarcts s/p tPA and mechanical thrombectomy 11/29/2018  . 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 S/P right knee arthroscopy 07/09/2017  . S/P knee surgery 01/10/2017    [facility] 05/04/2019, 2:22 PM  [facility] [address] [place], [place], [address] Phone: [phone]   Fax:   [phone]  Name: Deanna [name] MRN: [id number] Date of Birth: [birth year]   Vianne Bulls, OTR/L [facility] [address] [place], [place]  [address] [phone] phone [phone] 05/04/19 2:22 PM

## 2019-05-05 NOTE — Therapy (Signed)
[facility] [address] [place], [place], [address] Phone: [phone]   Fax:  [phone]  Physical Therapy Treatment  Patient Details  Name: Cierria Kogan MRN: [id number] Date of Birth: [birth year] Referring Provider (PT): Letta Pate Luanna Salk, MD   Encounter Date: 05/04/2019  PT End of Session - 05/04/19 1407    Visit Number  14    Number of Visits  24    Date for PT Re-Evaluation  06/14/19    Authorization Type  [employer]    PT Start Time  1403    PT Stop Time  1445    PT Time Calculation (min)  42 min    Equipment Utilized During Treatment  Gait belt    Activity Tolerance  Patient tolerated treatment well    Behavior During Therapy  [facility] for tasks assessed/performed       Past Medical History:  Diagnosis Date  . Arthritis    osteoarthritis in  both knees  . Asthma   . Difficult intravenous access   . GERD (gastroesophageal reflux disease)   . Heart murmur    born with years ago  . Pre-diabetes    last A1C=6.0 per pt  . Torn meniscus    left knee w fracture x2 to left knee  . Wears glasses     Past Surgical History:  Procedure Laterality Date  . BUBBLE STUDY  12/02/2018   Procedure: BUBBLE STUDY;  Surgeon: Elouise Munroe, MD;  Location: [facility];  Service: Cardiology;;  . CHOLECYSTECTOMY    . CHONDROPLASTY Right 07/09/2017   Procedure: CHONDROPLASTY;  Surgeon: Sydnee Cabal, MD;  Location: [facility];  Service: Orthopedics;  Laterality: Right;  . HERNIA REPAIR Right age 3   inguinal  . IR CT HEAD LTD  11/29/2018  . IR PERCUTANEOUS ART THROMBECTOMY/INFUSION INTRACRANIAL INC DIAG ANGIO  11/29/2018  . KNEE ARTHROSCOPY WITH MEDIAL MENISECTOMY Left 01/10/2017   Procedure: LEFT KNEE ARTHROSCOPY, DEBRIDEMENT, PARTIAL MEDIAL MENISCECTOMY, CHONDROPLASTY, ARTHROSCOPIC ASSISTED INTERNAL FIXATION OF MEDIAL FEMORAL CONDYLE AND MEDIAL TIBIAL PLATEAU INSUFFICIENCY  FRACTURES;  Surgeon: Sydnee Cabal, MD;  Location: [facility];  Service: Orthopedics;  Laterality: Left;  . KNEE ARTHROSCOPY WITH MEDIAL MENISECTOMY Right 07/09/2017   Procedure: Right knee scope, debridement, chondroplasty, partial meniscectomy, internal arthroscopic assisted internal fixation of medial tibial plateau fracture;  Surgeon: Sydnee Cabal, MD;  Location: [facility];  Service: Orthopedics;  Laterality: Right;  . LOOP RECORDER INSERTION N/A 12/02/2018   Procedure: LOOP RECORDER INSERTION;  Surgeon: Evans Lance, MD;  Location: [facility];  Service: Cardiovascular;  Laterality: N/A;  . RADIOLOGY WITH ANESTHESIA N/A 11/29/2018   Procedure: RADIOLOGY WITH ANESTHESIA;  Surgeon: Luanne Bras, MD;  Location: [facility];  Service: Radiology;  Laterality: N/A;  . TEE WITHOUT CARDIOVERSION N/A 12/02/2018   Procedure: TRANSESOPHAGEAL ECHOCARDIOGRAM (TEE);  Surgeon: Elouise Munroe, MD;  Location: [facility];  Service: Cardiology;  Laterality: N/A;  . TUBAL LIGATION  age 68    There were no vitals filed for this visit.  Subjective Assessment - 05/04/19 1406    Subjective  No new  complaints. No falls. Has not purchased new shoes as yet, still needing to return the old ones.    Pertinent History  CVA Rt MCA July 2020    Limitations  Lifting;Standing;Walking;House hold activities    How long can you stand comfortably?  15 min if she has UE support    How  long can you walk comfortably?  limited community ambulator with RW, has not been to grocery store.    Patient Stated Goals  work in the garden again.    Currently in Pain?  No/denies    Pain Score  0-No pain            [facility] Adult PT Treatment/Exercise - 05/04/19 1408      Transfers   Transfers  Sit to Stand;Stand to Sit    Sit to Stand  5: Supervision;With upper extremity assist;From chair/3-in-1;From bed    Stand to Sit  5: Supervision;With upper extremity assist;To bed;To  chair/3-in-1      Ambulation/Gait   Ambulation/Gait  Yes    Ambulation/Gait Assistance  4: Min guard;4: Min assist    Ambulation/Gait Assistance Details  cues on posture, increased left foot clearance with swing phase    Ambulation Distance (Feet)  115 Feet    Assistive device  Small based quad cane    Gait Pattern  Step-through pattern;Decreased hip/knee flexion - left;Decreased stance time - left;Decreased dorsiflexion - left    Ambulation Surface  Level;Indoor          Balance Exercises - 05/04/19 1416      Balance Exercises: Standing   Standing Eyes Closed  Narrow base of support (BOS);Foam/compliant surface;Other reps (comment);Head turns;30 secs;Limitations    Standing Eyes Closed Limitations  on airex with no UE support, feet close together- EC no head movements, progressing to EC head movements left<>right, then up<>down. Min guard to min assist for balance with cues on posture/weight shifting to assist with balance.     SLS with Vectors  Solid surface;Upper extremity assist 1;Other reps (comment);Limitations    SLS with Vectors Limitations  2 foam bubbles on floor with right UE support on sturdy surface- alternating fwd foot taps, then alternating cross foot taps. cues on posture, stance position and weight shifting with min guard assist to min assist needed.      Balance Beam  standing across red foam beam- fwd stepping to floor/back onto foam x 10 reps with right UE support on sturdy surface. cues for step length/step height and weight shifting.     Partial Tandem Stance  Eyes closed;Foam/compliant surface;3 reps;30 secs;Limitations    Partial Tandem Stance Limitations  on airex- EC no head movements with light UE support for 3 reps each foot forward.     Sit to Stand  Foam/compliant surface;Standard surface;Limitations;Upper extremity support    Sit to Stand Limitations  seated with feet across red foam beam: sit<>stands with emphasis on weight shifting, full standing and  slow,controlled descent with sitting down.           PT Short Term Goals - 04/15/19 1523      PT SHORT TERM GOAL #1   Title  Pt will be independent with progressive HEP for strengthening, balance and gait to continue to work on at home.    Time  4    Period  Weeks    Status  New    Target Date  05/15/19      PT SHORT TERM GOAL #2   Title  Pt will increase gait speed from 0.24m/s to >0.72m/s for improved gait safety.    Baseline  0.55m/s on 04/15/2019    Time  4    Period  Weeks    Status  New    Target Date  05/15/19      PT SHORT TERM GOAL #3   Title  Pt will ambulate 40' with quad cane supervision for improved household distances.    Time  4    Period  Weeks    Status  New    Target Date  05/15/19        PT Long Term Goals - 04/15/19 1524      PT LONG TERM GOAL #1   Title  Pt will ambulate >500' on varied surfaces with FWW mod I for all household and short community distances.    Time  8    Period  Weeks    Status  New    Target Date  06/14/19      PT LONG TERM GOAL #2   Title  Pt will increase Berg from 45/56 to 48/56 or higher for improved balance and decreased fall risk.    Baseline  45/56 on 04/15/2019    Time  8    Period  Weeks    Status  New    Target Date  06/14/19            Plan - 05/04/19 1408    Clinical Impression Statement  Today's skilled session continued to address gait with small base quad cane, strengthening and balance reactions. Rest breaks needed due to fatigue, otherwise no complaints. The pt is progressing toward goals and should benefit from continued PT to progress toward unmet goals.    Personal Factors and Comorbidities  Comorbidity 1    Comorbidities  CVA, TKA    Examination-Activity Limitations  Carry;Squat;Stairs;Lift;Stand;Locomotion Level;Transfers    Examination-Participation Restrictions  Meal Prep;Cleaning;Driving;Laundry;Shop    Stability/Clinical Decision Making  Evolving/Moderate complexity    Rehab Potential   Good    PT Frequency  2x / week    PT Duration  8 weeks   may decide to transfer to clinic closer to home in Jan   PT Treatment/Interventions  ADLs/Self Care Home Management;Aquatic Therapy;Electrical Stimulation;Cryotherapy;Moist Heat;DME Instruction;Gait training;Stair training;Functional mobility training;Therapeutic activities;Therapeutic exercise;Balance training;Neuromuscular re-education;Manual techniques;Orthotic Fit/Training;Passive range of motion;Taping    PT Next Visit Plan  check LTGs for anticpated discharge as pt is planning to transition to clinic closer to her home.    PT Home Exercise Plan  Access Code: [email or web address] and added ankle 4 way with yellow and standing H/T raises.    Consulted and Agree with Plan of Care  Patient       Patient will benefit from skilled therapeutic intervention in order to improve the following deficits and impairments:  Abnormal gait, Decreased balance, Decreased coordination, Decreased endurance, Decreased safety awareness, Decreased strength, Difficulty walking, Postural dysfunction, Obesity  Visit Diagnosis: Muscle weakness (generalized)  Other abnormalities of gait and mobility  Other lack of coordination     Problem List Patient Active Problem List   Diagnosis Date Noted  . Acute bilateral deep vein thrombosis (DVT) of popliteal veins (HCC) 12/26/2018  . Labile blood glucose   . Acute deep vein thrombosis (DVT) of tibial vein of left lower extremity ([place])   . Neurogenic bladder   . Slow transit constipation   . Acute deep vein thrombosis (DVT) of left tibial vein (HCC)   . Acute left hemiparesis ([place]) 12/02/2018  . Hyperlipemia 12/02/2018  . Prediabetes 12/02/2018  . Morbid obesity ([place]) 12/02/2018  . Chronic back pain 12/02/2018  . Urinary retention 12/02/2018  . Acute ischemic right MCA stroke ([place]) 12/02/2018  . Acute ischemic stroke ([place]) large R MCA and 2 L infarcts s/p tPA and mechanical thrombectomy 11/29/2018  . Middle  cerebral artery  embolism, right 11/29/2018  . S/P right knee arthroscopy 07/09/2017  . S/P knee surgery 01/10/2017    Willow Ora, PTA, [facility] [address] [place], [place] [address] [phone] 05/05/19, 9:44 AM   Name: Lillar Shutter MRN: [id number] Date of Birth: Feb 20, 1951

## 2019-05-06 ENCOUNTER — Ambulatory Visit
Admission: RE | Admit: 2019-05-06 | Discharge: 2019-05-06 | Disposition: A | Discharge status: Home / Self Care | Payer: Medicare Other | Source: Ambulatory Visit | Attending: Family Medicine | Admitting: Family Medicine

## 2019-05-06 ENCOUNTER — Other Ambulatory Visit: Payer: Self-pay

## 2019-05-06 DIAGNOSIS — Z1231 Encounter for screening mammogram for malignant neoplasm of breast: Secondary | ICD-10-CM

## 2019-05-12 ENCOUNTER — Telehealth: Payer: Self-pay

## 2019-05-12 DIAGNOSIS — I69959 Hemiplegia and hemiparesis following unspecified cerebrovascular disease affecting unspecified side: Secondary | ICD-10-CM

## 2019-05-12 NOTE — Progress Notes (Signed)
ILR remote 

## 2019-05-12 NOTE — Telephone Encounter (Signed)
[facility] to [facility] [address] [employer] [place] location as her OT has finished treatment.

## 2019-05-13 ENCOUNTER — Other Ambulatory Visit: Payer: Self-pay

## 2019-05-13 ENCOUNTER — Ambulatory Visit: Payer: Medicare Other | Admitting: Occupational Therapy

## 2019-05-13 ENCOUNTER — Ambulatory Visit: Payer: Medicare Other

## 2019-05-13 DIAGNOSIS — I69354 Hemiplegia and hemiparesis following cerebral infarction affecting left non-dominant side: Secondary | ICD-10-CM

## 2019-05-13 DIAGNOSIS — R41842 Visuospatial deficit: Secondary | ICD-10-CM

## 2019-05-13 DIAGNOSIS — M6281 Muscle weakness (generalized): Secondary | ICD-10-CM

## 2019-05-13 DIAGNOSIS — R2689 Other abnormalities of gait and mobility: Secondary | ICD-10-CM

## 2019-05-13 DIAGNOSIS — I69315 Cognitive social or emotional deficit following cerebral infarction: Secondary | ICD-10-CM

## 2019-05-13 DIAGNOSIS — R278 Other lack of coordination: Secondary | ICD-10-CM

## 2019-05-13 NOTE — Therapy (Signed)
[facility] [address] [place], [place], [address] Phone: [phone]   Fax:  [phone]  Physical Therapy Treatment/Discharge  Patient Details  Name: Deanna [name] MRN: [id number] Date of Birth: 1950-12-19 Referring Provider (PT): Kirsteins, Luanna Salk, MD  PHYSICAL THERAPY DISCHARGE SUMMARY  Visits from Start of Care: 15  Current functional level related to goals / functional outcomes: Pt currently ambulating mod I with RW. Has been working on gait with quad cane in clinic at [facility] level for shorter distances. Pt is still fall risk based on Berg score of 45/56.   Remaining deficits: Balance, strength, gait   Education / Equipment: HEP  Plan: Patient agrees to discharge.  Patient goals were not met. Patient is being discharged due to the patient's request.  ???Pt transferring care to a PT clinic closer to her home. ??        Encounter Date: 05/13/2019  PT End of Session - 05/13/19 1444    Visit Number  15    Number of Visits  24    Date for PT Re-Evaluation  06/14/19    Authorization Type  M[employer]    PT Start Time  1446    PT Stop Time  1524    PT Time Calculation (min)  38 min    Equipment Utilized During Treatment  Gait belt    Activity Tolerance  Patient tolerated treatment well    Behavior During Therapy  WFL for tasks assessed/performed       Past Medical History:  Diagnosis Date  . Arthritis    osteoarthritis in  both knees  . Asthma   . Difficult intravenous access   . GERD (gastroesophageal reflux disease)   . Heart murmur    born with years ago  . Pre-diabetes    last A1C=6.0 per pt  . Torn meniscus    left knee w fracture x2 to left knee  . Wears glasses     Past Surgical History:  Procedure Laterality Date  . BUBBLE STUDY  12/02/2018   Procedure: BUBBLE STUDY;  Surgeon: Elouise Munroe, MD;  Location: [place] [facility];  Service: Cardiology;;  .  CHOLECYSTECTOMY    . CHONDROPLASTY Right 07/09/2017   Procedure: CHONDROPLASTY;  Surgeon: Sydnee Cabal, MD;  Location[facility];  Service: Orthopedics;  Laterality: Right;  . HERNIA REPAIR Right age 33   inguinal  . IR CT HEAD LTD  11/29/2018  . IR PERCUTANEOUS ART THROMBECTOMY/INFUSION INTRACRANIAL INC DIAG ANGIO  11/29/2018  . KNEE ARTHROSCOPY WITH MEDIAL MENISECTOMY Left 01/10/2017   Procedure: LEFT KNEE ARTHROSCOPY, DEBRIDEMENT, PARTIAL MEDIAL MENISCECTOMY, CHONDROPLASTY, ARTHROSCOPIC ASSISTED INTERNAL FIXATION OF MEDIAL FEMORAL CONDYLE AND MEDIAL TIBIAL PLATEAU INSUFFICIENCY FRACTURES;  Surgeon: Sydnee Cabal, MD;  Location: [place];  Service: Orthopedics;  Laterality: Left;  . KNEE ARTHROSCOPY WITH MEDIAL MENISECTOMY Right 07/09/2017   Procedure: Right knee scope, debridement, chondroplasty, partial meniscectomy, internal arthroscopic assisted internal fixation of medial tibial plateau fracture;  Surgeon: Sydnee Cabal, MD;  Location: [facility];  Service: Orthopedics;  Laterality: Right;  . LOOP RECORDER INSERTION N/A 12/02/2018   Procedure: LOOP RECORDER INSERTION;  Surgeon: Evans Lance, MD;  Location: [place] [facility];  Service: Cardiovascular;  Laterality: N/A;  . RADIOLOGY WITH ANESTHESIA N/A 11/29/2018   Procedure: RADIOLOGY WITH ANESTHESIA;  Surgeon: Luanne Bras, MD;  Location: [facility];  Service: Radiology;  Laterality: N/A;  . TEE WITHOUT CARDIOVERSION N/A 12/02/2018   Procedure:  TRANSESOPHAGEAL ECHOCARDIOGRAM (TEE);  Surgeon: Elouise Munroe, MD;  Location: [place];  Service: Cardiology;  Laterality: N/A;  . TUBAL LIGATION  age 48    There were no vitals filed for this visit.                    [facility] Adult PT Treatment/Exercise - 05/13/19 1455      Ambulation/Gait   Ambulation/Gait  Yes    Ambulation/Gait Assistance  6: Modified independent (Device/Increase time)    Ambulation Distance  (Feet)  510 Feet    Assistive device  Rolling walker    Gait Pattern  Step-through pattern;Decreased hip/knee flexion - left;Decreased dorsiflexion - left    Ambulation Surface  Level;Indoor    Gait velocity  0.27ms with RW    Ramp  6: Modified independent (Device)    Curb  6: Modified independent (Device/increase time)    Gait Comments  Pt ambulated 230' with small based quad cane supervision/CGA with verbal cues to increase left foot clearance as decreased as goes on.      Standardized Balance Assessment   Standardized Balance Assessment  Berg Balance Test      Berg Balance Test   Sit to Stand  Able to stand without using hands and stabilize independently    Standing Unsupported  Able to stand safely 2 minutes    Sitting with Back Unsupported but Feet Supported on Floor or Stool  Able to sit safely and securely 2 minutes    Stand to Sit  Sits safely with minimal use of hands    Transfers  Able to transfer safely, minor use of hands    Standing Unsupported with Eyes Closed  Able to stand 10 seconds safely    Standing Ubsupported with Feet Together  Able to place feet together independently and stand 1 minute safely    From Standing, Reach Forward with Outstretched Arm  Can reach confidently >25 cm (10")    From Standing Position, Pick up Object from Floor  Able to pick up shoe safely and easily    From Standing Position, Turn to Look Behind Over each Shoulder  Looks behind from both sides and weight shifts well    Turn 360 Degrees  Needs close supervision or verbal cueing    Standing Unsupported, Alternately Place Feet on Step/Stool  Able to complete >2 steps/needs minimal assist    Standing Unsupported, One Foot in Front  Able to take small step independently and hold 30 seconds    Standing on One Leg  Tries to lift leg/unable to hold 3 seconds but remains standing independently    Total Score  45             PT Education - 05/13/19 1734    Education Details  PT verbally  reviewed HEP with patient. She denied any questions.    Person(s) Educated  Patient    Methods  Explanation    Comprehension  Verbalized understanding       PT Short Term Goals - 05/13/19 1456      PT SHORT TERM GOAL #1   Title  Pt will be independent with progressive HEP for strengthening, balance and gait to continue to work on at home.    Baseline  Pt reports that she has been working on her exercises.    Time  4    Period  Weeks    Status  Achieved    Target Date  05/15/19  PT SHORT TERM GOAL #2   Title  Pt will increase gait speed from 0.26ms to >0.714m for improved gait safety.    Baseline  0.8278mon 05/13/2019    Time  4    Period  Weeks    Status  Achieved    Target Date  05/15/19      PT SHORT TERM GOAL #3   Title  Pt will ambulate 11575ith quad cane supervision for improved household distances.    Baseline  230' with quad cane supervision/CGA for safety    Time  4    Period  Weeks    Status  Partially Met    Target Date  05/15/19        PT Long Term Goals - 05/13/19 1457      PT LONG TERM GOAL #1   Title  Pt will ambulate >500' on varied surfaces with FWW mod I for all household and short community distances.    Baseline  >500' mod I with RW on level surfaces inside and ramp/curb    Time  8    Period  Weeks    Status  Achieved      PT LONG TERM GOAL #2   Title  Pt will increase Berg from 45/56 to 48/56 or higher for improved balance and decreased fall risk.    Baseline  45/56 on 04/15/2019, 45/56 on 05/13/2019    Time  8    Period  Weeks    Status  Not Met            Plan - 05/13/19 1735    Clinical Impression Statement  PT reassessed goals as patient going to be transferring care to a clinic closer to home. Pt met gait speed goal and goal for mod I with RW. Pt still supervision/CGA for shorter distances with quad cane. Pt unable to progress BerMerrilee Janskyday but was tested last less than a month ago. Pt will still benefit from continued PT to  work more on balance, gait and strength. Discharging patient today from this clinic so she can transfer to one closer to home to continue.    Personal Factors and Comorbidities  Comorbidity 1    Comorbidities  CVA, TKA    Examination-Activity Limitations  Carry;Squat;Stairs;Lift;Stand;Locomotion Level;Transfers    Examination-Participation Restrictions  Meal Prep;Cleaning;Driving;Laundry;Shop    Stability/Clinical Decision Making  Evolving/Moderate complexity    Rehab Potential  Good    PT Frequency  2x / week    PT Duration  8 weeks   may decide to transfer to clinic closer to home in Jan   PT Treatment/Interventions  ADLs/Self Care Home Management;Aquatic Therapy;Electrical Stimulation;Cryotherapy;Moist Heat;DME Instruction;Gait training;Stair training;Functional mobility training;Therapeutic activities;Therapeutic exercise;Balance training;Neuromuscular re-education;Manual techniques;Orthotic Fit/Training;Passive range of motion;Taping    PT Next Visit Plan  Discharge today to transfer to another clinic.    PT Home Exercise Plan  Access Code: [email or web address] and added ankle 4 way with yellow and standing H/T raises.    Consulted and Agree with Plan of Care  Patient       Patient will benefit from skilled therapeutic intervention in order to improve the following deficits and impairments:  Abnormal gait, Decreased balance, Decreased coordination, Decreased endurance, Decreased safety awareness, Decreased strength, Difficulty walking, Postural dysfunction, Obesity  Visit Diagnosis: Muscle weakness (generalized)  Other abnormalities of gait and mobility     Problem List Patient Active Problem List   Diagnosis Date Noted  . Acute bilateral deep vein thrombosis (DVT) of popliteal  veins ([facility]) 12/26/2018  . Labile blood glucose   . Acute deep vein thrombosis (DVT) of tibial vein of left lower extremity ([facility])   . Neurogenic bladder   . Slow transit constipation   . Acute deep vein thrombosis  (DVT) of left tibial vein (H[facility])   . Acute left hemiparesis ([facility]) 12/02/2018  . Hyperlipemia 12/02/2018  . Prediabetes 12/02/2018  . Morbid obesity ([facility]) 12/02/2018  . Chronic back pain 12/02/2018  . Urinary retention 12/02/2018  . Acute ischemic right MCA stroke ([facility]) 12/02/2018  . Acute ischemic stroke ([place]) large R MCA and 2 L infarcts s/p tPA and mechanical thrombectomy 11/29/2018  . 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 S/P right knee arthroscopy 07/09/2017  . S/P knee surgery 01/10/2017    Electa Sniff, PT, DPT, NCS 05/13/2019, 5:38 PM  [facility] [address] [place], [place], [address] Phone: [phone]   Fax:  [phone]  Name: Deanna [name] MRN: [id number] Date of Birth: December 05, 1950

## 2019-05-13 NOTE — Patient Instructions (Signed)
    ROM: Abduction - Wand sitting   Holding wand with left hand palm up, push wand directly out to side, leading with other hand palm down, until stretch is felt. Hold 5 seconds. Repeat 10 times per set. Do 2-3 sessions per day      [employer] - sitting    With arms straight, hold cane forward at waist. Raise cane above head. Hold 3 seconds. Repeat 10 times. Do 2 times per day.

## 2019-05-13 NOTE — Therapy (Signed)
[facility] [address] [place], [place], [address] Phone: [phone]   Fax:  [phone]  Occupational Therapy Treatment  Patient Details  Name: Deanna [name] MRN: [id number] Date of Birth: Mar 03, 1951 Referring Provider (OT): Dr. Letta Pate   Encounter Date: 05/13/2019     OCCUPATIONAL THERAPY DISCHARGE SUMMARY   Current functional level related to goals / functional outcomes: Pt made good overall progress.   Remaining deficits: Decreased LUE coordination, strength, decreased divided/ alternating attention, decreased left side awareness.   Education / Equipment: Pt was educated regarding: HEP, safety for cooking, and recommendation that pt does not drive yet due to difficulties with higher level attention and decreased reaction speed. Pt verbalized understanding. Therapist recommends pt has MD clearance prior to return to driving and that she starts driving gradually with someone when she returns to driving.Pt plans to see her opthamologist prior to return to driving. Pt verbalized understanding.            OT End of Session - 05/13/19 1405    Visit Number  14    Number of Visits  24    Date for OT Re-Evaluation  06/04/19    Authorization Type  Medicare, [employer]    Authorization Time Period  anticipate earlier discharge based on progress    Authorization - Visit Number  14    Authorization - Number of Visits  20    OT Start Time  1404    OT Stop Time  1444    OT Time Calculation (min)  40 min    Activity Tolerance  Patient tolerated treatment well    Behavior During Therapy  WFL for tasks assessed/performed       Past Medical History:  Diagnosis Date  . Arthritis    osteoarthritis in  both knees  . Asthma   . Difficult intravenous access   . GERD (gastroesophageal reflux disease)   . Heart murmur    born with years ago  . Pre-diabetes    last A1C=6.0 per pt  . Torn meniscus    left  knee w fracture x2 to left knee  . Wears glasses     Past Surgical History:  Procedure Laterality Date  . BUBBLE STUDY  12/02/2018   Procedure: BUBBLE STUDY;  Surgeon: Elouise Munroe, MD;  Location: [place];  Service: Cardiology;;  . CHOLECYSTECTOMY    . CHONDROPLASTY Right 07/09/2017   Procedure: CHONDROPLASTY;  Surgeon: Sydnee Cabal, MD;  Location: [facility];  Service: Orthopedics;  Laterality: Right;  . HERNIA REPAIR Right age 78   inguinal  . IR CT HEAD LTD  11/29/2018  . IR PERCUTANEOUS ART THROMBECTOMY/INFUSION INTRACRANIAL INC DIAG ANGIO  11/29/2018  . KNEE ARTHROSCOPY WITH MEDIAL MENISECTOMY Left 01/10/2017   Procedure: LEFT KNEE ARTHROSCOPY, DEBRIDEMENT, PARTIAL MEDIAL MENISCECTOMY, CHONDROPLASTY, ARTHROSCOPIC ASSISTED INTERNAL FIXATION OF MEDIAL FEMORAL CONDYLE AND MEDIAL TIBIAL PLATEAU INSUFFICIENCY FRACTURES;  Surgeon: Sydnee Cabal, MD;  Location: [facility];  Service: Orthopedics;  Laterality: Left;  . KNEE ARTHROSCOPY WITH MEDIAL MENISECTOMY Right 07/09/2017   Procedure: Right knee scope, debridement, chondroplasty, partial meniscectomy, internal arthroscopic assisted internal fixation of medial tibial plateau fracture;  Surgeon: Sydnee Cabal, MD;  Location: [facility];  Service: Orthopedics;  Laterality: Right;  . LOOP RECORDER INSERTION N/A 12/02/2018   Procedure: LOOP RECORDER INSERTION;  Surgeon: Evans Lance, MD;  Location: [place] [facility];  Service: Cardiovascular;  Laterality: N/A;  . RADIOLOGY WITH  ANESTHESIA N/A 11/29/2018   Procedure: RADIOLOGY WITH ANESTHESIA;  Surgeon: Luanne Bras, MD;  Location: [facility];  Service: Radiology;  Laterality: N/A;  . TEE WITHOUT CARDIOVERSION N/A 12/02/2018   Procedure: TRANSESOPHAGEAL ECHOCARDIOGRAM (TEE);  Surgeon: Elouise Munroe, MD;  Location: [facility];  Service: Cardiology;  Laterality: N/A;  . TUBAL LIGATION  age 31    There were no vitals filed for  this visit.  Subjective Assessment - 05/13/19 1405    Subjective   Pt reports that she is cooking more, has done simple cooking on stovetop, doing laundry/dishes, my husband is going to get me a cart to put pots/pans on to move them    Pertinent History  68 year old female with history of prediabetes, OA bilateral knees, obesity who was admitted on 11/29/2018 with left-sided tingling and left lower extremity weakness with right gaze preference.  CTA head neck with perfusion showed proximal right M2 occlusion with associated right MCA infarct with penumbra and moderate cervical artery atherosclerosis.  She underwent cerebral angiogram with endovascular revascularization of right MCA  Follow-up MRI brain showed moderate acute right-MCA infarct with petechial hemorrhage in 1 of 2 acute left frontoparietal infarcts.    On 07/20, she had increasing left-sided weakness with nausea and follow-up CT head showed large edematous infarct in right MCA with substantial increase in hypodensity and new hemorrhagic conversion in deep white matter of posterior frontal and temporal lobes    Patient Stated Goals  improve use of LUE    Currently in Pain?  No/denies    Pain Onset  More than a month ago            Treatment:Therapist checked progress towards goals . Pt agrees with plans for d/c. Pt was instructed in cane exercises to perform in seated as HEP. Pt returned demonstration, see pt instructions. Amb. While performing cognitive task with good accuracy. Therapist recommends pt does not drive at this time. Therapist recommends pt has clearance from MD and that she returns to driving gradually, with someone at first due to decreased reaction time and difficulties with multi tasking at times. Pt verbalized understanding. Grooved pegboard for LUE fine motor coordination, increased time and min difficulty. Graded clothespins for sustained pinch, and functional reach with LUE, min difficulty, continued mildly  decreased LUE control.                  OT Short Term Goals - 04/22/19 1523      OT SHORT TERM GOAL #1   Title  I with HEP    Status  Achieved      OT SHORT TERM GOAL #2   Title  Pt will demonstrate improved fine motor coordination for ADLs as evidenced by decreasing 9 hole peg test score to 50 secs or less.    Status  Achieved      OT SHORT TERM GOAL #3   Title  Pt will demonstrate ability to retrieve a lightweight object with LUE at 110*sh. flexion  for increased functional reach.    Status  Achieved      OT SHORT TERM GOAL #4   Title  Pt will perform basic cooking with supervision and min v.c    Status  Achieved      OT SHORT TERM GOAL #5   Title  Pt will perform environmental scanning with 80% or better accuracy.    Status  Achieved        OT Long Term Goals - 05/13/19 1406  OT LONG TERM GOAL #1   Title  I with updated HEP.    Status  Achieved      OT LONG TERM GOAL #2   Title  Pt will demonstrate improved fine motor coordination for IADLS  as evidenced  by decreasing 9 hole peg test score to 35 secs or less.    Status  Achieved      OT LONG TERM GOAL #3   Title  Pt will demonstrate ability to retrieve a 1 lbs object from overhead shelf at 115* with good control.    Status  Achieved      OT LONG TERM GOAL #4   Title  Pt will resume perfromance of mod complex cooking and home management modified independently.    Status  Partially Met   met for cooking, she is performing lighter home management     OT LONG TERM GOAL #5   Title  Pt will perfrom environmental scanning in a busy environment with 90% or better accuracy.    Status  Achieved      OT LONG TERM GOAL #6   Title  Pt will demonstrate ability to perfrom a physical and cognitive task simultaneously with 90% or better accuracy in prep for return to driving.    Status  On-going            Plan - 05/13/19 1436    Clinical Impression Statement  Pt demonstrates good overall progress.  Pt requests to d/c so that she can transition to therapy at [facility] closer to her home. Pt continues to demonstrate some difficulties with higher lever attention. Therapist has recommended that pt has clearance from her MD prior to return to driving and she should perfrom graduated driving  with her husband initally once she is cleared.    Occupational performance deficits (Please refer to evaluation for details):  ADL's;IADL's;Leisure;Social Participation    Body Structure / Function / Physical Skills  ADL;Balance;Mobility;Strength;UE functional use;FMC;Coordination;Gait;Vision;ROM;GMC;Decreased knowledge of precautions;Decreased knowledge of use of DME;IADL;Dexterity;Sensation    Cognitive Skills  Attention;Safety Awareness;Problem Solve    Rehab Potential  Good    OT Frequency  2x / week    OT Duration  12 weeks    OT Treatment/Interventions  Self-care/ADL training;Therapeutic exercise;Balance training;Functional Mobility Training;Manual Therapy;Neuromuscular education;Ultrasound;Aquatic Therapy;Therapeutic activities;Cognitive remediation/compensation;DME and/or AE instruction;Paraffin;Cryotherapy;Fluidtherapy;Visual/perceptual remediation/compensation;Patient/family education;Passive range of motion;Moist Heat;Electrical Stimulation    Plan  d/c OT    Consulted and Agree with Plan of Care  Patient       Patient will benefit from skilled therapeutic intervention in order to improve the following deficits and impairments:   Body Structure / Function / Physical Skills: ADL, Balance, Mobility, Strength, UE functional use, FMC, Coordination, Gait, Vision, ROM, GMC, Decreased knowledge of precautions, Decreased knowledge of use of DME, IADL, Dexterity, Sensation Cognitive Skills: Attention, Safety Awareness, Problem Solve     Visit Diagnosis: Muscle weakness (generalized)  Other lack of coordination  Visuospatial deficit  Hemiplegia and hemiparesis following cerebral infarction affecting  left non-dominant side (HCC)  Cognitive social or emotional deficit following cerebral infarction  Other abnormalities of gait and mobility    Problem List Patient Active Problem List   Diagnosis Date Noted  . Acute bilateral deep vein thrombosis (DVT) of popliteal veins (HCC) 12/26/2018  . Labile blood glucose   . Acute deep vein thrombosis (DVT) of tibial vein of left lower extremity ([place])   . Neurogenic bladder   . Slow transit constipation   . Acute deep vein  thrombosis (DVT) of left tibial vein ([place])   . Acute left hemiparesis ([place]) 12/02/2018  . Hyperlipemia 12/02/2018  . Prediabetes 12/02/2018  . Morbid obesity ([place]) 12/02/2018  . Chronic back pain 12/02/2018  . Urinary retention 12/02/2018  . Acute ischemic right MCA stroke ([place]) 12/02/2018  . Acute ischemic stroke ([place]) large R MCA and 2 L infarcts s/p tPA and mechanical thrombectomy 11/29/2018  . 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 S/P right knee arthroscopy 07/09/2017  . S/P knee surgery 01/10/2017    Deanna [name] 05/13/2019, 2:40 PM  [facility] [address] [place], [place], [address] Phone: [phone]   Fax:  [phone]  Name: Deanna [name] MRN: [id number] Date of Birth: [birth year]

## 2019-05-14 NOTE — Telephone Encounter (Signed)
Ok to switch PT from [address] [facility] to [facility], don't think it requires an order

## 2019-05-14 NOTE — Telephone Encounter (Signed)
I did this earlier ( the referral) because it is not @ [facility] but [facility].

## 2019-05-19 ENCOUNTER — Ambulatory Visit (INDEPENDENT_AMBULATORY_CARE_PROVIDER_SITE_OTHER): Payer: Medicare Other | Admitting: *Deleted

## 2019-05-19 DIAGNOSIS — I63511 Cerebral infarction due to unspecified occlusion or stenosis of right middle cerebral artery: Secondary | ICD-10-CM | POA: Diagnosis not present

## 2019-05-19 LAB — CUP PACEART REMOTE DEVICE CHECK
Date Time Interrogation Session: 20210105083055
Implantable Pulse Generator Implant Date: 20200721

## 2019-05-20 ENCOUNTER — Encounter: Payer: Self-pay | Admitting: Adult Health

## 2019-05-20 ENCOUNTER — Ambulatory Visit (INDEPENDENT_AMBULATORY_CARE_PROVIDER_SITE_OTHER): Payer: Medicare Other | Admitting: Adult Health

## 2019-05-20 ENCOUNTER — Other Ambulatory Visit: Payer: Self-pay

## 2019-05-20 VITALS — BP 139/67 | HR 80 | Temp 97.3°F | Ht 61.0 in | Wt 240.4 lb

## 2019-05-20 DIAGNOSIS — I6601 Occlusion and stenosis of right middle cerebral artery: Secondary | ICD-10-CM | POA: Diagnosis not present

## 2019-05-20 DIAGNOSIS — I10 Essential (primary) hypertension: Secondary | ICD-10-CM | POA: Diagnosis not present

## 2019-05-20 DIAGNOSIS — E785 Hyperlipidemia, unspecified: Secondary | ICD-10-CM | POA: Diagnosis not present

## 2019-05-20 DIAGNOSIS — I639 Cerebral infarction, unspecified: Secondary | ICD-10-CM | POA: Diagnosis not present

## 2019-05-20 DIAGNOSIS — R7303 Prediabetes: Secondary | ICD-10-CM

## 2019-05-20 NOTE — Patient Instructions (Signed)
Continue aspirin 81 mg daily  and Lipitor for secondary stroke prevention  Continue to follow up with PCP regarding cholesterol and blood pressure management   Continue to monitor blood pressure at home  Maintain strict control of hypertension with blood pressure goal below 130/90, diabetes with hemoglobin A1c goal below 6.5% and cholesterol with LDL cholesterol (bad cholesterol) goal below 70 mg/dL. I also advised the patient to eat a healthy diet with plenty of whole grains, cereals, fruits and vegetables, exercise regularly and maintain ideal body weight.  Followup in the future with me in 6 months or call earlier if needed       Thank you for coming to see [place] at [facility]. I hope we have been able to provide you high quality care today.  You may receive a patient satisfaction survey over the next few weeks. We would appreciate your feedback and comments so that we may continue to improve ourselves and the health of our patients.

## 2019-05-20 NOTE — Progress Notes (Signed)
[facility] [address] [place]. [place] [address] [phone]       OFFICE FOLLOW UP NOTE  [name] Date of Birth:  Dec 11, 1950 Medical Record Number:  [id number]   Reason for Referral: stroke follow up    CHIEF COMPLAINT:  Chief Complaint  Patient presents with  . Follow-up    3 mon f/u. Alone. Treatment room. No new concerns at this time.     HPI: Stroke admission 11/29/2018: Ms.Deanna [name] a 69 y.o.femalewith a history of pre diabetes and asthma but on no medications, presented to [facility] ED on 11/29/2018 withleft-sided tingling and right gaze preference.  Stroke work-up revealed large right MCA M2 punctate left frontal temporal infarcts as evidenced on MRI status post TPA and IR with right MCA TICI 2B revascularization embolic pattern secondary to unknown etiology.  Post TPA imaging showed small hemorrhage stable.  TEE unremarkable therefore loop recorder placed for long-term monitoring of atrial fibrillation and stroke etiology.  Initiated aspirin 81 mg daily.  No DAPT due to post TPA hemorrhage.  HTN stable long-term BP goal normotensive range.  LDL 103 initiated atorvastatin 40 mg daily.  Pre-DM with A1c 5.9.  Other stroke risk factors include advanced age and morbid obesity but no prior history of stroke.  Residual deficits from right gaze preference, left homonymous hemianopia, mild dysarthria, mild left facial weakness and left hemiparesis.  Discharged to CIR for ongoing therapies.   CT head - Acute posterior right MCA infarct with hyperdense right M2 branches.  CTA H&N -Proximal right M2 occlusion.  CT Penumbra - Associated acute right MCA infarct with penumbra.   Cerebral angio TICI2b+ revascularization of occluded Rt MCA inf division with x 1 pass embotrap and penumbra.  Post IR CT no gross hmg, contrast blush present R post frontal parietal region  MRI head -mod R MCA infarct w/ confluent petechial hmg. 2 small L punctate  Frontotemporal infarcts   Repeat stat CT 7/20 stable, no significant change  2D Echo EF > 65%. No source of embolus  TEEno PFO, no SOE  Loopplaced 12/02/2018  [name] Virus 2 - negative  LDL- 103  HgbA1c- 5.9  No antithromboticprior to admission, now on aspirin 81 mg dailypost tPA  Therapy recommendations:CIR  Disposition: CIR  During CIR admission, repeat LE venous Doppler showed progression of acute DVT from left PTV to left popliteal vein therefore recommended anticoagulation with Eliquis 5 mg twice daily as repeat CT head stable without new bleeding or hematoma.  She was discharged home with recommendation of home health therapy on 12/26/2018.  Initial visit 02/04/2019: [name] is being seen today, 02/04/2019, for hospital follow-up accompanied by her daughter.  Residual deficits left hemiparesis with improvement.  Denies residual vision or speech difficulties.  She will be completing OT shortly and continues to participate in home health PT.  Currently ambulates with rolling walker but in the process of transitioning to cane inside the house with assistance of PT.  Currently living with her husband but does have assistance of her daughters when needed.  She is becoming more independent with ADLs.  Currently on aspirin 81 mg daily and Eliquis 5 mg twice daily without bleeding or bruising.  Continues on atorvastatin 40 mg daily without myalgias.  Blood pressure today 146/82 but does monitor at home and typically lower.  Recently started on sertraline by PCP for increased anxiety.  Loop recorder is not shown atrial fibrillation thus far.  Denies new or worsening stroke/TIA symptoms.  Update 05/20/2019: [name] is a 69 year old female who is being seen today for stroke follow-up.  Residual deficits of mild LUE weakness and mild imbalance but overall greatly improving.  She does continue to ambulate with Rollator walker outdoors and will use four-point cane in her home.   Denies any recent falls she has recently completed PT/OT but plans on starting additional PT at [facility] physical therapy.  Repeat lower extremity ultrasound showed resolution of acute DVT therefore Eliquis discontinued and was initiated on aspirin 81 mg daily.  She has continued on aspirin without bleeding or bruising.  Continues on atorvastatin without myalgias.  She plans on following up with her PCP in the near future for repeat lab work.  Blood pressure today 139/67.  Loop recorder is not shown atrial fibrillation thus far.  Denies new or worsening stroke/TIA symptoms.    ROS:   14 system review of systems performed and negative with exception of weakness, swelling in legs and anxiety  PMH:  Past Medical History:  Diagnosis Date  . Arthritis    osteoarthritis in  both knees  . Asthma   . Difficult intravenous access   . GERD (gastroesophageal reflux disease)   . Heart murmur    born with years ago  . Pre-diabetes    last A1C=6.0 per pt  . Torn meniscus    left knee w fracture x2 to left knee  . Wears glasses     PSH:  Past Surgical History:  Procedure Laterality Date  . BUBBLE STUDY  12/02/2018   Procedure: BUBBLE STUDY;  Surgeon: Elouise Munroe, MD;  Location: [place];  Service: Cardiology;;  . CHOLECYSTECTOMY    . CHONDROPLASTY Right 07/09/2017   Procedure: CHONDROPLASTY;  Surgeon: Sydnee Cabal, MD;  Location: [facility];  Service: Orthopedics;  Laterality: Right;  . HERNIA REPAIR Right age 13   inguinal  . IR CT HEAD LTD  11/29/2018  . IR PERCUTANEOUS ART THROMBECTOMY/INFUSION INTRACRANIAL INC DIAG ANGIO  11/29/2018  . KNEE ARTHROSCOPY WITH MEDIAL MENISECTOMY Left 01/10/2017   Procedure: LEFT KNEE ARTHROSCOPY, DEBRIDEMENT, PARTIAL MEDIAL MENISCECTOMY, CHONDROPLASTY, ARTHROSCOPIC ASSISTED INTERNAL FIXATION OF MEDIAL FEMORAL CONDYLE AND MEDIAL TIBIAL PLATEAU INSUFFICIENCY FRACTURES;  Surgeon: Sydnee Cabal, MD;  Location: [place];  Service: Orthopedics;  Laterality: Left;  . KNEE ARTHROSCOPY WITH MEDIAL MENISECTOMY Right 07/09/2017   Procedure: Right knee scope, debridement, chondroplasty, partial meniscectomy, internal arthroscopic assisted internal fixation of medial tibial plateau fracture;  Surgeon: Sydnee Cabal, MD;  Location: [facility];  Service: Orthopedics;  Laterality: Right;  . LOOP RECORDER INSERTION N/A 12/02/2018   Procedure: LOOP RECORDER INSERTION;  Surgeon: Evans Lance, MD;  Location: [place] [facility];  Service: Cardiovascular;  Laterality: N/A;  . RADIOLOGY WITH ANESTHESIA N/A 11/29/2018   Procedure: RADIOLOGY WITH ANESTHESIA;  Surgeon: Luanne Bras, MD;  Location: [place];  Service: Radiology;  Laterality: N/A;  . TEE WITHOUT CARDIOVERSION N/A 12/02/2018   Procedure: TRANSESOPHAGEAL ECHOCARDIOGRAM (TEE);  Surgeon: Elouise Munroe, MD;  Location: [place];  Service: Cardiology;  Laterality: N/A;  . TUBAL LIGATION  age 33    Social History:  Social History   Socioeconomic History  . Marital status: Married    Spouse name: Not on file  . Number of children: Not on file  . Years of education: Not on file  . Highest education level: Not on file  Occupational History  . Not on file  Tobacco Use  . Smoking  status: Never Smoker  . Smokeless tobacco: Never Used  Substance and Sexual Activity  . Alcohol use: No  . Drug use: No  . Sexual activity: Not on file  Other Topics Concern  . Not on file  Social History Narrative  . Not on file   Social Determinants of Health   Financial Resource Strain:   . Difficulty of Paying Living Expenses: Not on file  Food Insecurity:   . Worried About Charity fundraiser in the Last Year: Not on file  . Ran Out of Food in the Last Year: Not on file  Transportation Needs:   . Lack of Transportation (Medical): Not on file  . Lack of Transportation (Non-Medical): Not on file  Physical Activity:   . Days of Exercise per  Week: Not on file  . Minutes of Exercise per Session: Not on file  Stress:   . Feeling of Stress : Not on file  Social Connections:   . Frequency of Communication with Friends and Family: Not on file  . Frequency of Social Gatherings with Friends and Family: Not on file  . Attends Religious Services: Not on file  . Active Member of Clubs or Organizations: Not on file  . Attends Archivist Meetings: Not on file  . Marital Status: Not on file  Intimate Partner Violence:   . Fear of Current or Ex-Partner: Not on file  . Emotionally Abused: Not on file  . Physically Abused: Not on file  . Sexually Abused: Not on file    Family History:  Family History  Problem Relation Age of Onset  . Stroke Father   . Stroke Brother     Medications:   Current Outpatient Medications on File Prior to Visit  Medication Sig Dispense Refill  . acetaminophen (TYLENOL) 500 MG tablet Take 1,000 mg by mouth as needed (Knee pain).    Marland Kitchen aspirin EC 81 MG tablet Take 81 mg by mouth daily.    Marland Kitchen atorvastatin (LIPITOR) 40 MG tablet Take 1 tablet (40 mg total) by mouth daily at 6 PM. 30 tablet 1  . Calcium Carbonate (CALCIUM 600 PO) Take 1 tablet by mouth daily. 1    . diclofenac Sodium (VOLTAREN) 1 % GEL APPLY 2 G TOPICALLY 4 (FOUR) TIMES DAILY. TO LEFT KNEE 300 g 2  . famotidine (PEPCID) 10 MG tablet Take 10 mg by mouth daily.    . hydrOXYzine (VISTARIL) 25 MG capsule Take 25 mg by mouth as needed.    . Multiple Vitamin (MULTIVITAMIN) tablet Take 1 tablet by mouth daily.    . polyethylene glycol (MIRALAX / GLYCOLAX) 17 g packet Take 17 g by mouth daily. (Patient taking differently: Take 17 g by mouth daily as needed. ) 30 each 0  . senna-docusate (SENOKOT-S) 8.6-50 MG tablet Take 2 tablets by mouth at bedtime. 60 tablet 0  . sertraline (ZOLOFT) 25 MG tablet Take 25 mg by mouth daily.     No current facility-administered medications on file prior to visit.    Allergies:  No Known  Allergies   Physical Exam  Vitals:   05/20/19 1006  BP: 139/67  Pulse: 80  Temp: (!) 97.3 F (36.3 C)  TempSrc: Oral  Weight: 240 lb 6.4 oz (109 kg)  Height: 5\' 1"  (1.549 m)   Body mass index is 45.42 kg/m. No exam data present  General: well developed, well nourished,  pleasant middle-age Caucasian female, seated, in no evident distress Head: head normocephalic and atraumatic.  Neck: supple with no carotid or supraclavicular bruits Cardiovascular: regular rate and rhythm, no murmurs; +1 pitting edema bilaterally Musculoskeletal: no deformity Skin:  no rash/petichiae Vascular:  Normal pulses all extremities   Neurologic Exam Mental Status: Awake and fully alert. Oriented to place and time. Recent and remote memory intact. Attention span, concentration and fund of knowledge appropriate. Mood and affect appropriate.  Cranial Nerves: Fundoscopic exam reveals sharp disc margins. Pupils equal, briskly reactive to light. Extraocular movements full without nystagmus. Visual fields full to confrontation.  Right gaze preference but is able to cross midline without difficulty.  Hearing intact. Facial sensation intact.  Very mild left lower facial weakness Motor: Normal bulk and tone.  LUE: 5/5 with decreased hand dexterity LLE: 5/5 Full-strength right upper and lower extremity Sensory.: intact to touch , pinprick , position and vibratory sensation.  Coordination: Rapid alternating movements normal in all extremities except decreased mildly left hand dexterity. Finger-to-nose and heel-to-shin performed accurately bilaterally.  Orbits right arm on the left arm Gait and Station: Arises from chair without difficulty. Stance is normal. Gait demonstrates  wide-based gait with assistance of Rollator walker Reflexes: 1+ and symmetric. Toes downgoing.       Diagnostic Data (Labs, Imaging, Testing)  CT HEAD WO CONTRAST 11/29/2018 1. Acute posterior right MCA infarct with hyperdense right  M2 branches. No hemorrhage.  2. ASPECTS is 6.   Ct Angio Head W Or Wo Contrast Ct Angio Neck W Or Wo Contrast Ct Cerebral Perfusion W Contrast 11/29/2018 1. Proximal right M2 occlusion. 2. Associated acute right MCA infarct with penumbra on CTP as above.  3. Moderate cervical carotid artery atherosclerosis without significant stenosis.   Interventional Radiology S/P RT common carotid arterriogram followed Byrevascularization of occluded Rt MCA inf division with x 1 pass with 47mm x 58mm embotrap retriever device and penumbra aspiration achieving a TICI 2b+(TICI 2c ) revascularization. S.Deveshwar MD  Mr Brain Wo Contrast 11/30/2018 1. Moderate-sized acute right MCA infarct with confluent petechial hemorrhage. 2. One or two punctate acute left frontoparietal infarcts. 3. Mild chronic small vessel ischemic disease.  Ct Head Wo Contrast 12/01/2018 1. Redemonstrated large edematous infarction of the posterior right MCA territory, edema and hypodensity substantially increased compared to prior examination dated 11/29/2018. 2. There is new hemorrhagic conversion anteriorly, noted in the deep white matter of the posterior frontal and temporal lobes about the insula (series 7, image 15). 3. No significant midline shift or evidence of downward herniation.   Portable CXR 11/30/18 Minimal bibasilar subsegmental atelectasis.  Transthoracic Echocardiogram  11/30/2018 1. The left ventricle has hyperdynamic systolic function, with an ejection fraction of >65%. The cavity size was normal. Left ventricular diastolic Doppler parameters are consistent with impaired relaxation. 2. The right ventricle has normal systolic function. The cavity was normal. 3. The mitral valve is abnormal. There is moderate mitral annular calcification present. 4. The tricuspid valve is grossly normal. 5. The aortic valve is tricuspid. No stenosis of the aortic valve. 6. The aortic root is normal in size and  structure. 7. Vigorous LV systolic function; mild diastolic dysfunction; trace TR.  EKG - SR rate 96 BPM. (See cardiology reading for complete details)  TEE No PFO with rest or valsalva No LAA thrombus No left sided valvular lesions Aortic atherosclerosis present in aortic arch     ASSESSMENT: Deanna [name] is a 69 y.o. year old female presented with left-sided tingling and right gaze preference on 11/29/2018 with stroke work-up revealing large right MCA and 2 punctate  left frontotemporal infarcts s/p TPA and IR with right MCA revascularization secondary to unknown etiology.  Loop recorder placed which has not shown atrial fibrillation thus far.  Evidence of left lower extremity DVT with progression from left PTV to left popliteal vein therefore Eliquis initiated.  Follow-up lower extremity ultrasound showed resolution of DVT therefore Eliquis discontinued and initiated on aspirin.  Vascular risk factors include HTN, HLD, pre-DM and morbid obesity.  Residual deficits of mild LUE weakness and imbalance but overall greatly improving   PLAN:  1. Cryptogenic stroke: Continue Eliquis (apixaban) daily  and Lipitor for secondary stroke prevention.  No indication for aspirin 81 mg in addition to Eliquis therefore recommend discontinuing aspirin and continue on Eliquis alone.  Maintain strict control of hypertension with blood pressure goal below 130/90, diabetes with hemoglobin A1c goal below 6.5% and cholesterol with LDL cholesterol (bad cholesterol) goal below 70 mg/dL.  I also advised the patient to eat a healthy diet with plenty of whole grains, cereals, fruits and vegetables, exercise regularly with at least 30 minutes of continuous activity daily and maintain ideal body weight.  Continue to monitor loop recorder for atrial fibrillation 2. Occlusion of right MCA: s/p thrombectomy.  Recommend obtaining repeat CTA head for surveillance monitoring as that she did not follow-up with vascular surgery  recommended 3. HTN: Advised to continue current treatment regimen.  Today's BP stable.  Advised to continue to monitor at home along with continued follow-up with PCP for management 4. HLD: Advised to continue current treatment regimen along with continued follow-up with PCP for future prescribing and monitoring of lipid panel 5. Pre- DMII: Continue to follow with PCP for ongoing monitoring   Follow up in 6 months or call earlier if needed   Greater than 50% of time during this 25 minute visit was spent on counseling, discussion regarding residual deficits and ongoing improvement, explanation of diagnosis of cryptogenic stroke, reviewing risk factor management of acute DVT, HTN, HLD and pre-DM, planning of further management along with potential future management, and discussion with patient and family answering all questions.    Frann Rider, AGNP-BC  [facility] [address] [place], [place] [address]  Phone [phone] Fax [phone] Note: This document was prepared with digital dictation and possible smart phrase technology. Any transcriptional errors that result from this process are unintentional.

## 2019-05-22 ENCOUNTER — Telehealth: Payer: Self-pay | Admitting: Adult Health

## 2019-05-22 ENCOUNTER — Telehealth: Payer: Self-pay

## 2019-05-22 NOTE — Telephone Encounter (Signed)
Pt has called to inform that on her last visit with NP Janett Billow there was no mention of a suggested scan.  Pt said it came as a surprise to her to get a call from [place] re: a scan.  Pt is asking for a call from RN to discuss before call GI back.

## 2019-05-22 NOTE — Telephone Encounter (Signed)
I spoke with the pt and her husband to let them know that her monitor is automatic. She do need to send manual transmission unless we call and ask for one.

## 2019-05-25 ENCOUNTER — Telehealth: Payer: Self-pay | Admitting: *Deleted

## 2019-05-25 NOTE — Progress Notes (Signed)
I agree with the above plan 

## 2019-05-25 NOTE — Telephone Encounter (Signed)
I forwarded you the office visit note requesting that you call her to advise her that I placed an order for CTA after her visit when charting.  This is just for surveillance monitoring.  Please advise.

## 2019-05-25 NOTE — Telephone Encounter (Signed)
I called pt.  I reiterated what was in the ofv note.  She stated did not know, but will proceed with CTA.  She states that she was not aware of any other type of appt from vascular surgery.  If this is something she needs then please go ahead and make this referral.

## 2019-05-25 NOTE — Telephone Encounter (Signed)
I did see this CC chart note now.  Did not see this am when spoke to pt.

## 2019-05-25 NOTE — Telephone Encounter (Signed)
Yes I have just seen this in [place].  I did explain about the CTA as monitoring.  She was appreciative when spoke to her this am.

## 2019-06-04 ENCOUNTER — Other Ambulatory Visit: Payer: Self-pay

## 2019-06-04 ENCOUNTER — Ambulatory Visit
Admission: RE | Admit: 2019-06-04 | Discharge: 2019-06-04 | Disposition: A | Discharge status: Home / Self Care | Payer: Medicare Other | Source: Ambulatory Visit | Attending: Adult Health | Admitting: Adult Health

## 2019-06-04 MED ORDER — IOPAMIDOL (ISOVUE-370) INJECTION 76%
75.0000 mL | Freq: Once | INTRAVENOUS | Status: AC | PRN
  Administered 2019-06-04: 75 mL via INTRAVENOUS

## 2019-06-10 NOTE — Telephone Encounter (Signed)
Spoke to [name] regarding her CTA results. Explained results as well as reaching out to IR Dr. Estanislado Pandy and Phill Myron, PA who recommended undergoing cerebral arteriogram in 3 months based on CTA results.  She was advised that she will be contacted by IR in regards to scheduling this procedure.  Answered all questions to patient satisfaction and appreciative of phone call.

## 2019-06-10 NOTE — Telephone Encounter (Signed)
Pt is asking for a call with the results to her CT scan once available

## 2019-06-11 ENCOUNTER — Other Ambulatory Visit (HOSPITAL_COMMUNITY): Payer: Self-pay | Admitting: Interventional Radiology

## 2019-06-11 DIAGNOSIS — I771 Stricture of artery: Secondary | ICD-10-CM

## 2019-06-12 ENCOUNTER — Ambulatory Visit (HOSPITAL_COMMUNITY)
Admission: RE | Admit: 2019-06-12 | Discharge: 2019-06-12 | Disposition: A | Discharge status: Home / Self Care | Payer: Medicare Other | Source: Ambulatory Visit | Attending: Interventional Radiology | Admitting: Interventional Radiology

## 2019-06-12 ENCOUNTER — Other Ambulatory Visit: Payer: Self-pay

## 2019-06-12 DIAGNOSIS — I771 Stricture of artery: Secondary | ICD-10-CM

## 2019-06-12 NOTE — Progress Notes (Signed)
Chief Complaint: Patient was seen in consultation today for right MCA M2 segment stenosis.  Referring Physician(s): Code Stroke- Amie [place]; Frann Rider  Supervising Physician: Luanne Bras  Patient Status: [facility] - Out-pt  History of Present Illness: Deanna [name] is a 69 y.o. female with a past medical history as below, with pertinent past medical history including CVA 11/2018, asthma, GERD, pre-diabetes, and arthritis. She is known to [facility]- she underwent an image-guided cerebral arteriogram with emergent mechanical thrombectomy of right MCA occlusion achieving a TICI 2b+ (2c) revascularization 11/29/2018 by Dr. Estanislado Pandy. She was discharged to [facility] 12/02/2018 and discharged home 12/26/2018 with outpatient neurology follow-up. She met with Frann Rider, NP who ordered appropriate imaging scans for follow-up.  CTA head 06/04/2019: 1. Continued patency of the revascularized right M2 inferior division. 2. Severe distal right M2 branch vessel stenosis with attenuation of more distal branch vessels corresponding to the large chronic MCA infarct. 3. No new intracranial arterial occlusion or significant proximal stenosis.  NIR requested by Frann Rider, NP for management of right MCA M2 segment stenosis. Patient awake and alert sitting in chair with no complaints at this time. Accompanied by husband and daughter. Denies headache, weakness, numbness/tingling, dizziness, vision changes, hearing changes, tinnitus, or speech difficulty.  Currently taking Aspirin 81 mg once daily.   Past Medical History:  Diagnosis Date   Arthritis    osteoarthritis in  both knees   Asthma    Difficult intravenous access    GERD (gastroesophageal reflux disease)    Heart murmur    born with years ago   Pre-diabetes    last A1C=6.0 per pt   Torn meniscus    left knee w fracture x2 to left knee   Wears glasses     Past Surgical History:  Procedure Laterality Date   BUBBLE STUDY   12/02/2018   Procedure: BUBBLE STUDY;  Surgeon: Elouise Munroe, MD;  Location: [place];  Service: Cardiology;;   CHOLECYSTECTOMY     CHONDROPLASTY Right 07/09/2017   Procedure: CHONDROPLASTY;  Surgeon: Sydnee Cabal, MD;  Location: [facility];  Service: Orthopedics;  Laterality: Right;   HERNIA REPAIR Right age 30   inguinal   IR CT HEAD LTD  11/29/2018   IR PERCUTANEOUS ART THROMBECTOMY/INFUSION INTRACRANIAL INC DIAG ANGIO  11/29/2018   KNEE ARTHROSCOPY WITH MEDIAL MENISECTOMY Left 01/10/2017   Procedure: LEFT KNEE ARTHROSCOPY, DEBRIDEMENT, PARTIAL MEDIAL MENISCECTOMY, CHONDROPLASTY, ARTHROSCOPIC ASSISTED INTERNAL FIXATION OF MEDIAL FEMORAL CONDYLE AND MEDIAL TIBIAL PLATEAU INSUFFICIENCY FRACTURES;  Surgeon: Sydnee Cabal, MD;  Location: [place];  Service: Orthopedics;  Laterality: Left;   KNEE ARTHROSCOPY WITH MEDIAL MENISECTOMY Right 07/09/2017   Procedure: Right knee scope, debridement, chondroplasty, partial meniscectomy, internal arthroscopic assisted internal fixation of medial tibial plateau fracture;  Surgeon: Sydnee Cabal, MD;  Location: [facility];  Service: Orthopedics;  Laterality: Right;   LOOP RECORDER INSERTION N/A 12/02/2018   Procedure: LOOP RECORDER INSERTION;  Surgeon: Evans Lance, MD;  Location: [place] [facility];  Service: Cardiovascular;  Laterality: N/A;   RADIOLOGY WITH ANESTHESIA N/A 11/29/2018   Procedure: RADIOLOGY WITH ANESTHESIA;  Surgeon: Luanne Bras, MD;  Location: [place];  Service: Radiology;  Laterality: N/A;   TEE WITHOUT CARDIOVERSION N/A 12/02/2018   Procedure: TRANSESOPHAGEAL ECHOCARDIOGRAM (TEE);  Surgeon: Elouise Munroe, MD;  Location: [place];  Service: Cardiology;  Laterality: N/A;   TUBAL LIGATION  age 28    Allergies: Patient has no known allergies.  Medications: Prior  to Admission medications   Medication Sig Start Date End Date Taking? Authorizing Provider    acetaminophen (TYLENOL) 500 MG tablet Take 1,000 mg by mouth as needed (Knee pain).    [provider]  aspirin EC 81 MG tablet Take 81 mg by mouth daily.    [provider]  atorvastatin (LIPITOR) 40 MG tablet Take 1 tablet (40 mg total) by mouth daily at 6 PM. 12/26/18   Love, Ivan Anchors, PA-C  Calcium Carbonate (CALCIUM 600 PO) Take 1 tablet by mouth daily. 1    [provider]  diclofenac Sodium (VOLTAREN) 1 % GEL APPLY 2 G TOPICALLY 4 (FOUR) TIMES DAILY. TO LEFT KNEE 05/01/19   Kirsteins, Luanna Salk, MD  famotidine (PEPCID) 10 MG tablet Take 10 mg by mouth daily.    [provider]  hydrOXYzine (VISTARIL) 25 MG capsule Take 25 mg by mouth as needed. 01/26/19   [provider]  Multiple Vitamin (MULTIVITAMIN) tablet Take 1 tablet by mouth daily.    [provider]  polyethylene glycol (MIRALAX / GLYCOLAX) 17 g packet Take 17 g by mouth daily. Patient taking differently: Take 17 g by mouth daily as needed.  12/26/18   Love, Ivan Anchors, PA-C  senna-docusate (SENOKOT-S) 8.6-50 MG tablet Take 2 tablets by mouth at bedtime. 12/26/18   Love, Ivan Anchors, PA-C  sertraline (ZOLOFT) 25 MG tablet Take 25 mg by mouth daily. 01/26/19   [provider]     Family History  Problem Relation Age of Onset   Stroke Father    Stroke Brother     Social History   Socioeconomic History   Marital status: Married    Spouse name: Not on file   Number of children: Not on file   Years of education: Not on file   Highest education level: Not on file  Occupational History   Not on file  Tobacco Use   Smoking status: Never Smoker   Smokeless tobacco: Never Used  Substance and Sexual Activity   Alcohol use: No   Drug use: No   Sexual activity: Not on file  Other Topics Concern   Not on file  Social History Narrative   Not on file   Social Determinants of Health   Financial Resource Strain:    Difficulty of Paying Living Expenses: Not  on file  Food Insecurity:    Worried About [place] in the Last Year: Not on file   Ran Out of Food in the Last Year: Not on file  Transportation Needs:    Lack of Transportation (Medical): Not on file   Lack of Transportation (Non-Medical): Not on file  Physical Activity:    Days of Exercise per Week: Not on file   Minutes of Exercise per Session: Not on file  Stress:    Feeling of Stress : Not on file  Social Connections:    Frequency of Communication with Friends and Family: Not on file   Frequency of Social Gatherings with Friends and Family: Not on file   Attends Religious Services: Not on file   Active Member of Clubs or Organizations: Not on file   Attends Archivist Meetings: Not on file   Marital Status: Not on file     Review of Systems: A 12 point ROS discussed and pertinent positives are indicated in the HPI above.  All other systems are negative.  Review of Systems  Constitutional: Negative for chills and fever.  HENT: Negative  for hearing loss and tinnitus.   Eyes: Negative for visual disturbance.  Respiratory: Negative for shortness of breath and wheezing.   Cardiovascular: Negative for chest pain and palpitations.  Neurological: Negative for dizziness, speech difficulty, weakness, numbness and headaches.  Psychiatric/Behavioral: Negative for behavioral problems and confusion.    Vital Signs: LMP  (LMP Unknown)   Physical Exam Constitutional:      General: She is not in acute distress.    Appearance: Normal appearance.  Pulmonary:     Effort: Pulmonary effort is normal. No respiratory distress.  Skin:    General: Skin is warm and dry.  Neurological:     Mental Status: She is alert and oriented to person, place, and time.  Psychiatric:        Mood and Affect: Mood normal.        Behavior: Behavior normal.      Imaging: CT ANGIO HEAD W OR WO CONTRAST  Result Date: 06/04/2019 CLINICAL DATA:  Stroke follow-up. Right  MCA infarct in 11/2018. Right M2 occlusion status post endovascular revascularization. EXAM: CT ANGIOGRAPHY HEAD TECHNIQUE: Multidetector CT imaging of the head was performed using the standard protocol during bolus administration of intravenous contrast. Multiplanar CT image reconstructions and MIPs were obtained to evaluate the vascular anatomy. CONTRAST:  41m ISOVUE-370 IOPAMIDOL (ISOVUE-370) INJECTION 76% COMPARISON:  Head CT 12/08/2018, MRI 11/30/2018, and CTA 11/29/2018 FINDINGS: CT HEAD Brain: The large posterior right MCA infarct demonstrates expected interval evolution with development of encephalomalacia. There is ex vacuo dilatation of the right lateral ventricle. Hypodensities elsewhere in the cerebral white matter bilaterally are unchanged and nonspecific but compatible with mild chronic small vessel ischemic disease. There is no evidence of acute infarct, intracranial hemorrhage, mass, midline shift, or extra-axial fluid collection. Vascular: Calcified atherosclerosis at the skull base. Skull: No fracture or suspicious osseous lesion. Sinuses: Visualized paranasal sinuses and mastoid air cells are clear. Orbits: Unremarkable. CTA HEAD Anterior circulation: The internal carotid arteries are patent from skull base to carotid termini with mild nonstenotic atherosclerotic plaque bilaterally. ACAs and MCAs are patent without evidence of significant A1 or M1 stenosis. The right M2 inferior division remains patent following revascularization, however there is a severe distal M2 branch vessel stenosis as well as attenuation of more distal branch vessels corresponding to the region of chronic infarct. No aneurysm is identified. Posterior circulation: The visualized distal vertebral arteries are widely patent to the basilar with the left being mildly dominant. Patent left PICA, right AICA, and bilateral SCA origins are identified. The basilar artery is patent and developmentally small without evidence of  significant focal stenosis. There are large posterior communicating arteries bilaterally with a diminutive right P1 segment. PCAs are patent with mild branch vessel irregularity but no significant proximal stenosis. No aneurysm is identified. Venous sinuses: Patent. Anatomic variants: Predominantly fetal origin of both PCAs. IMPRESSION: 1. Continued patency of the revascularized right M2 inferior division. 2. Severe distal right M2 branch vessel stenosis with attenuation of more distal branch vessels corresponding to the large chronic MCA infarct. 3. No new intracranial arterial occlusion or significant proximal stenosis. Electronically Signed   By: ALogan BoresM.D.   On: 06/04/2019 15:13   CUP PACEART REMOTE DEVICE CHECK  Result Date: 05/19/2019 Carelink summary report received. Battery status OK. Normal device function. No new symptom episodes, tachy episodes, brady, or pause episodes. No new AF episodes. Monthly summary reports and ROV/PRN [place]   Labs:  CBC: Recent Labs    12/08/18 0932 12/09/18 [id number]  12/10/18 0506 12/22/18 0917  WBC 9.4 7.9 8.2 5.9  HGB 14.4 13.6 13.7 13.5  HCT 41.9 40.9 39.9 40.4  PLT 332 328 337 298    COAGS: Recent Labs    11/30/18 0736 12/02/18 0113  INR 1.1 1.1  APTT 26  --     BMP: Recent Labs    12/03/18 0514 12/08/18 0932 12/15/18 0650 12/22/18 0917  NA 138 139 137 138  K 3.9 4.2 4.4 3.9  CL 103 103 103 101  CO2 '25 26 22 25  '$ GLUCOSE 124* 165* 121* 180*  BUN '12 11 11 12  '$ CALCIUM 9.0 9.1 9.1 9.1  CREATININE 0.85 0.87 0.87 1.00  GFRNONAA >60 >60 >60 57*  GFRAA >60 >60 >60 >60    LIVER FUNCTION TESTS: Recent Labs    11/29/18 1955 12/03/18 0514  BILITOT 0.3 1.4*  AST 21 21  ALT 19 21  ALKPHOS 82 64  PROT 6.1* 6.4*  ALBUMIN 3.5 3.5     Assessment and Plan:  History of CVA secondary to right MCA occlusion s/p emergent mechanical thrombectomy achieving a TICI 2b+ (TICI 2c) revascularization 11/29/2018 by Dr. Estanislado Pandy; F/U CTA  06/04/2019 revealed severe right MCA M2 segment stenosis. Dr. Estanislado Pandy was present for consultation.  Discussed patient's progress post-stroke. Patient states that she is back to normal. States that she is independent at home. States that she ambulates using a walker or cane. She is asymptomatic from a neurologic standpoint at this time.  Discussed severe right MCA M2 stenosis seen on CTA. Explained that the best course of management for her stenosis at this time is with an image-guided diagnostic cerebral arteriogram to accurately quantify patient's stenosis. Since patient is grossly asymptomatic from a neurologic standpoint, recommend procedure 3 months from CTA. Plan for follow-up with an image-guided diagnostic cerebral arteriogram 3 months from CTA 06/04/2019. Informed patient that our schedulers will call her to set up this procedure. Instructed patient to continue taking Aspirin 81 mg once daily. Patient's family has questions regarding Plavix use. Recommend that patient follow-up with neurology regarding secondary stroke prevention.  All questions answered and concerns addressed. Patient and family convey understanding and agree with plan.  Thank you for this interesting consult.  I greatly enjoyed meeting Deanna [name] and look forward to participating in their care.  A copy of this report was sent to the requesting provider on this date.  Electronically Signed: Earley Abide, PA-C 06/12/2019, 9:25 AM   I spent a total of 40 Minutes in face to face in clinical consultation, greater than 50% of which was counseling/coordinating care for right MCA M2 segment stenosis.

## 2019-06-15 ENCOUNTER — Telehealth: Payer: Self-pay | Admitting: Adult Health

## 2019-06-15 NOTE — Telephone Encounter (Signed)
Per review of IR office visit note, it was recommended to continue on aspirin 81 mg daily and to follow-up with [place] regarding family questioning need of Plavix.  No indication at this time for use of Plavix from a neurological standpoint as at this time, use of both Plavix and aspirin together only increases potential bleeding risk and does not decrease risk of reoccurring stroke therefore recommended continuation of aspirin 81 mg daily for secondary stroke prevention.

## 2019-06-15 NOTE — Telephone Encounter (Signed)
[name], Deanna(daughter on [place]) has called stating  Dr Estanislado Pandy mentioned pt getting on Plavix but stated pt's neurologist would have to prescribe it.  Daughter is asking for a call to discuss this for pt.  Please call

## 2019-06-16 NOTE — Telephone Encounter (Signed)
I called pt and relayed that per JM/NP (after reviewing Dr. Arlean Hopping note)  neuro standpoint ok to stay only on aspirin 81mg  po daily as taking both plavix and asa increases potential for bleeding and does not decrease risk of reoccuring stroke.  She verbalized understanding.  Will call back if questions.

## 2019-06-18 ENCOUNTER — Encounter (HOSPITAL_COMMUNITY): Payer: Self-pay

## 2019-06-18 HISTORY — PX: IR RADIOLOGIST EVAL & MGMT: IMG5224

## 2019-06-22 ENCOUNTER — Ambulatory Visit (INDEPENDENT_AMBULATORY_CARE_PROVIDER_SITE_OTHER): Payer: Medicare Other | Admitting: *Deleted

## 2019-06-22 DIAGNOSIS — I63511 Cerebral infarction due to unspecified occlusion or stenosis of right middle cerebral artery: Secondary | ICD-10-CM | POA: Diagnosis not present

## 2019-06-22 LAB — CUP PACEART REMOTE DEVICE CHECK
Date Time Interrogation Session: 20210208000922
Implantable Pulse Generator Implant Date: 20200721

## 2019-06-23 NOTE — Progress Notes (Signed)
ILR Remote 

## 2019-06-26 ENCOUNTER — Encounter: Payer: Medicare Other | Attending: Physical Medicine & Rehabilitation | Admitting: Physical Medicine & Rehabilitation

## 2019-06-26 ENCOUNTER — Encounter: Payer: Self-pay | Admitting: Physical Medicine & Rehabilitation

## 2019-06-26 ENCOUNTER — Other Ambulatory Visit: Payer: Self-pay

## 2019-06-26 VITALS — BP 126/75 | HR 89 | Temp 97.7°F | Ht 61.0 in | Wt 238.0 lb

## 2019-06-26 DIAGNOSIS — I63511 Cerebral infarction due to unspecified occlusion or stenosis of right middle cerebral artery: Secondary | ICD-10-CM | POA: Diagnosis not present

## 2019-06-26 DIAGNOSIS — I69959 Hemiplegia and hemiparesis following unspecified cerebrovascular disease affecting unspecified side: Secondary | ICD-10-CM

## 2019-06-26 DIAGNOSIS — I82442 Acute embolism and thrombosis of left tibial vein: Secondary | ICD-10-CM | POA: Insufficient documentation

## 2019-06-26 DIAGNOSIS — I82433 Acute embolism and thrombosis of popliteal vein, bilateral: Secondary | ICD-10-CM | POA: Insufficient documentation

## 2019-06-26 DIAGNOSIS — R7303 Prediabetes: Secondary | ICD-10-CM | POA: Diagnosis present

## 2019-06-26 NOTE — Patient Instructions (Signed)
Graduated return to driving instructions were provided. It is recommended that the patient first drives with another licensed driver in an empty parking lot. If the patient does well with this, and they can drive on a quiet street with the licensed driver. If the patient does well with this they can drive on a busy street with a licensed driver. If the patient does well with this, the next time out they can go by himself. For the first month after resuming driving, I recommend no nighttime or Interstate driving.   See eye doctor to get ok to drive first

## 2019-06-26 NOTE — Progress Notes (Signed)
Subjective:    Patient ID: Deanna [name], female    DOB: October 15, 1950, 69 y.o.   MRN: [id number] 69 year old female with history of prediabetes, OA bilateral knees, obesity who was admitted on 11/29/2018 with left-sided tingling and left lower extremity weakness with right gaze preference.  CTA head neck with perfusion showed proximal right M2 occlusion with associated right MCA infarct with penumbra and moderate cervical artery atherosclerosis.  She underwent cerebral Angie with endovascular revascularization of right MCA by Dr. Terrilee Files.  Follow-up MRI brain showed moderate acute right-MCA infarct with petechial hemorrhage in 1 of 2 acute left frontoparietal infarcts.    On 07/20, she had increasing left-sided weakness with nausea and follow-up CT head showed large edematous infarct in right MCA with substantial increase in hypodensity and new hemorrhagic conversion in deep white matter of posterior frontal and temporal lobes.  She was treated with fluid boluses with recommendations to keep SBP goal less than 160.  HPI  Pain Inventory Average Pain 1 Pain Right Now 1 My pain is na  In the last 24 hours, has pain interfered with the following? General activity 1 Relation with others 1 Enjoyment of life 1 What TIME of day is your pain at its worst? morning Sleep (in general) Good  Pain is worse with: some activites Pain improves with: therapy/exercise Relief from Meds: 9  Mobility use a cane use a walker ability to climb steps?  yes do you drive?  no  Function retired I need assistance with the following:  meal prep, household duties and shopping  Neuro/Psych No problems in this area  Prior Studies Any changes since last visit?  no  Physicians involved in your care Any changes since last visit?  no   Family History  Problem Relation Age of Onset  . Stroke Father   . Stroke Brother    Social History   Socioeconomic History  . Marital status: Married    Spouse name: Not  on file  . Number of children: Not on file  . Years of education: Not on file  . Highest education level: Not on file  Occupational History  . Not on file  Tobacco Use  . Smoking status: Never Smoker  . Smokeless tobacco: Never Used  Substance and Sexual Activity  . Alcohol use: No  . Drug use: No  . Sexual activity: Not on file  Other Topics Concern  . Not on file  Social History Narrative  . Not on file   Social Determinants of Health   Financial Resource Strain:   . Difficulty of Paying Living Expenses: Not on file  Food Insecurity:   . Worried About [place] fund[place] in the Last Year: Not on file  . Ran Out of Food in the Last Year: Not on file  Transportation Needs:   . Lack of Transportation (Medical): Not on file  . Lack of Transportation (Non-Medical): Not on file  Physical Activity:   . Days of Exercise per Week: Not on file  . Minutes of Exercise per Session: Not on file  Stress:   . Feeling of Stress : Not on file  Social Connections:   . Frequency of Communication with Friends and Family: Not on file  . Frequency of Social Gatherings with Friends and Family: Not on file  . Attends Religious Services: Not on file  . Active Member of Clubs or Organizations: Not on file  . Attends Archivist Meetings: Not on file  .  Marital Status: Not on file   Past Surgical History:  Procedure Laterality Date  . BUBBLE STUDY  12/02/2018   Procedure: BUBBLE STUDY;  Surgeon: Elouise Munroe, MD;  Location: [place];  Service: Cardiology;;  . CHOLECYSTECTOMY    . CHONDROPLASTY Right 07/09/2017   Procedure: CHONDROPLASTY;  Surgeon: Sydnee Cabal, MD;  Location: [facility];  Service: Orthopedics;  Laterality: Right;  . HERNIA REPAIR Right age 33   inguinal  . IR CT HEAD LTD  11/29/2018  . IR PERCUTANEOUS ART THROMBECTOMY/INFUSION INTRACRANIAL INC DIAG ANGIO  11/29/2018  . IR RADIOLOGIST EVAL & MGMT  06/18/2019  . KNEE ARTHROSCOPY WITH MEDIAL  MENISECTOMY Left 01/10/2017   Procedure: LEFT KNEE ARTHROSCOPY, DEBRIDEMENT, PARTIAL MEDIAL MENISCECTOMY, CHONDROPLASTY, ARTHROSCOPIC ASSISTED INTERNAL FIXATION OF MEDIAL FEMORAL CONDYLE AND MEDIAL TIBIAL PLATEAU INSUFFICIENCY FRACTURES;  Surgeon: Sydnee Cabal, MD;  Location: [place];  Service: Orthopedics;  Laterality: Left;  . KNEE ARTHROSCOPY WITH MEDIAL MENISECTOMY Right 07/09/2017   Procedure: Right knee scope, debridement, chondroplasty, partial meniscectomy, internal arthroscopic assisted internal fixation of medial tibial plateau fracture;  Surgeon: Sydnee Cabal, MD;  Location: [facility];  Service: Orthopedics;  Laterality: Right;  . LOOP RECORDER INSERTION N/A 12/02/2018   Procedure: LOOP RECORDER INSERTION;  Surgeon: Evans Lance, MD;  Location: [place] [facility];  Service: Cardiovascular;  Laterality: N/A;  . RADIOLOGY WITH ANESTHESIA N/A 11/29/2018   Procedure: RADIOLOGY WITH ANESTHESIA;  Surgeon: Luanne Bras, MD;  Location: [place];  Service: Radiology;  Laterality: N/A;  . TEE WITHOUT CARDIOVERSION N/A 12/02/2018   Procedure: TRANSESOPHAGEAL ECHOCARDIOGRAM (TEE);  Surgeon: Elouise Munroe, MD;  Location: [place];  Service: Cardiology;  Laterality: N/A;  . TUBAL LIGATION  age 50   Past Medical History:  Diagnosis Date  . Arthritis    osteoarthritis in  both knees  . Asthma   . Difficult intravenous access   . GERD (gastroesophageal reflux disease)   . Heart murmur    born with years ago  . Pre-diabetes    last A1C=6.0 per pt  . Torn meniscus    left knee w fracture x2 to left knee  . Wears glasses    LMP  (LMP Unknown)   Opioid Risk Score:   Fall Risk Score:  `1  Depression screen PHQ 2/9  Depression screen PHQ 2/9 02/04/2019  Decreased Interest 0  Down, Depressed, Hopeless 0  PHQ - 2 Score 0     Review of Systems  Constitutional: Negative.   HENT: Negative.   Eyes: Negative.   Respiratory: Negative.     Cardiovascular: Negative.   Gastrointestinal: Negative.   Endocrine: Negative.   Genitourinary: Negative.   Musculoskeletal: Positive for gait problem.  Skin: Negative.   Allergic/Immunologic: Negative.   Hematological: Negative.   Psychiatric/Behavioral: Negative.   All other systems reviewed and are negative.      Objective:   Physical Exam Vitals reviewed.  Constitutional:      Appearance: Normal appearance.  HENT:     Head: Normocephalic and atraumatic.  Eyes:     Extraocular Movements: Extraocular movements intact.     Conjunctiva/sclera: Conjunctivae normal.     Pupils: Pupils are equal, round, and reactive to light.  Cardiovascular:     Rate and Rhythm: Normal rate and regular rhythm.     Heart sounds: Normal heart sounds.  Pulmonary:     Effort: Pulmonary effort is normal. No respiratory distress.     Breath sounds: Normal  breath sounds. No stridor. No wheezing.  Abdominal:     General: Abdomen is flat. Bowel sounds are normal. There is no distension.     Palpations: Abdomen is soft.     Tenderness: There is no abdominal tenderness.  Musculoskeletal:     Right lower leg: No edema.     Left lower leg: No edema.  Skin:    General: Skin is warm and dry.  Neurological:     Mental Status: She is alert and oriented to person, place, and time.     Cranial Nerves: No dysarthria or facial asymmetry.     Motor: Weakness present.     Coordination: Impaired rapid alternating movements.     Gait: Gait abnormal.     Comments: Gait is using a quad cane, there is a stiff leg gait on the left side some mild tendency towards foot drag but no physical contact of toe Motor strength is 4/5 in the left deltoid bicep tricep grip hip flexion extension ankle dorsiflexion 5/5 on the right side There is decreased rapid alternating supination pronation left upper extremity compared to the right side.  There is mild slowness with finger thumb opposition on the left side compared to the  right side  Psychiatric:        Mood and Affect: Mood normal.        Behavior: Behavior normal.        Thought Content: Thought content normal.        Judgment: Judgment normal.           Assessment & Plan:  #1.  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  She does not have any clinical signs of left neglect or field cut.  She does need to follow-up with ophthalmology. She is completed her inpatient and outpatient rehabilitation and I have reminded her to continue with her home exercise program on a daily basis. Assuming that ophthalmology does not find any significant visual deficits, she may return to driving on a graduated fashion as outlined below  Graduated return to driving instructions were provided. It is recommended that the patient first drives with another licensed driver in an empty parking lot. If the patient does well with this, and they can drive on a quiet street with the licensed driver. If the patient does well with this they can drive on a busy street with a licensed driver. If the patient does well with this, the next time out they can go by himself. For the first month after resuming driving, I recommend no nighttime or Interstate driving.  Physical medicine rehab follow-up on a as needed basis  #2.  Secondary stroke prevention follow-up with primary care Dr. Lindell Noe as well as neurology, Frann Rider nurse practitioner

## 2019-07-27 ENCOUNTER — Ambulatory Visit (INDEPENDENT_AMBULATORY_CARE_PROVIDER_SITE_OTHER): Payer: Medicare Other | Admitting: *Deleted

## 2019-07-27 DIAGNOSIS — I63511 Cerebral infarction due to unspecified occlusion or stenosis of right middle cerebral artery: Secondary | ICD-10-CM | POA: Diagnosis not present

## 2019-07-27 LAB — CUP PACEART REMOTE DEVICE CHECK
Date Time Interrogation Session: 20210314235650
Implantable Pulse Generator Implant Date: 20200721

## 2019-07-27 NOTE — Progress Notes (Signed)
ILR Remote 

## 2019-08-28 ENCOUNTER — Other Ambulatory Visit (HOSPITAL_COMMUNITY): Payer: Self-pay | Admitting: Interventional Radiology

## 2019-08-28 ENCOUNTER — Telehealth (HOSPITAL_COMMUNITY): Payer: Self-pay | Admitting: Radiology

## 2019-08-28 DIAGNOSIS — I639 Cerebral infarction, unspecified: Secondary | ICD-10-CM

## 2019-08-28 NOTE — Telephone Encounter (Signed)
Returned call to pt and spouse. They would like to know if Deanna [name] would do her cerebral angiogram and treatment for stenosis at the same time instead of having two procedures. I sent a message to [place], [place]. Will let them know once Deanna [name] decides. They agree with this plan of care. JM

## 2019-08-31 ENCOUNTER — Ambulatory Visit (INDEPENDENT_AMBULATORY_CARE_PROVIDER_SITE_OTHER): Payer: Medicare Other | Admitting: *Deleted

## 2019-08-31 DIAGNOSIS — I63511 Cerebral infarction due to unspecified occlusion or stenosis of right middle cerebral artery: Secondary | ICD-10-CM | POA: Diagnosis not present

## 2019-08-31 LAB — CUP PACEART REMOTE DEVICE CHECK
Date Time Interrogation Session: 20210414235857
Implantable Pulse Generator Implant Date: 20200721

## 2019-08-31 NOTE — Progress Notes (Signed)
ILR Remote 

## 2019-09-30 LAB — CUP PACEART REMOTE DEVICE CHECK
Date Time Interrogation Session: 20210516000554
Implantable Pulse Generator Implant Date: 20200721

## 2019-10-05 ENCOUNTER — Ambulatory Visit (INDEPENDENT_AMBULATORY_CARE_PROVIDER_SITE_OTHER): Payer: Medicare Other | Admitting: *Deleted

## 2019-10-05 DIAGNOSIS — I63511 Cerebral infarction due to unspecified occlusion or stenosis of right middle cerebral artery: Secondary | ICD-10-CM | POA: Diagnosis not present

## 2019-10-06 NOTE — Progress Notes (Signed)
[facility] Summary Report / Loop Recorder 

## 2019-11-04 ENCOUNTER — Other Ambulatory Visit: Payer: Self-pay | Admitting: Family Medicine

## 2019-11-04 ENCOUNTER — Other Ambulatory Visit (HOSPITAL_COMMUNITY)
Admission: RE | Admit: 2019-11-04 | Discharge: 2019-11-04 | Disposition: A | Discharge status: Home / Self Care | Payer: Medicare Other | Source: Ambulatory Visit | Attending: Family Medicine | Admitting: Family Medicine

## 2019-11-04 DIAGNOSIS — Z01411 Encounter for gynecological examination (general) (routine) with abnormal findings: Secondary | ICD-10-CM | POA: Diagnosis present

## 2019-11-04 DIAGNOSIS — Z1151 Encounter for screening for human papillomavirus (HPV): Secondary | ICD-10-CM | POA: Insufficient documentation

## 2019-11-04 DIAGNOSIS — E2839 Other primary ovarian failure: Secondary | ICD-10-CM

## 2019-11-05 ENCOUNTER — Telehealth: Payer: Self-pay

## 2019-11-05 LAB — CYTOLOGY - PAP
Comment: NEGATIVE
Diagnosis: NEGATIVE
High risk HPV: NEGATIVE

## 2019-11-05 NOTE — Telephone Encounter (Signed)
ILR tachy alert, 2 episodes with 1 received showing SVT/NCT with avg rate 167bpm (11/02/19 @ 1001), 2 min. 2nd episode not received. Routing to call for manual transmission and further review. JBox, RN/CVRS  The pt agreed to send a manual transmission. I told her once the nurse review the transmission she will give her a call back.

## 2019-11-05 NOTE — Telephone Encounter (Signed)
ILR tachy alert, 2 episodes with 1 received showing SVT/NCT with avg rate 167bpm (11/02/19 @ 1001), 2 min. 2nd episode not received. Routing to call for manual transmission and further review.   Manual transmission received.  2nd episode occurred immediately following first episode.  Both appear SVT.    Pt meds include ASA81mg .    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  She does not recall any cardiac symptoms.  At current time she feels fine, no complaints.    Advised would forward to MD for review, anticipate continued monitoring.

## 2019-11-08 NOTE — Telephone Encounter (Signed)
Agree. SVT. If her symptoms worsen, I am glad to see to discuss catheter ablation. GT

## 2019-11-09 ENCOUNTER — Ambulatory Visit (INDEPENDENT_AMBULATORY_CARE_PROVIDER_SITE_OTHER): Payer: Medicare Other | Admitting: *Deleted

## 2019-11-09 DIAGNOSIS — I639 Cerebral infarction, unspecified: Secondary | ICD-10-CM

## 2019-11-09 LAB — CUP PACEART REMOTE DEVICE CHECK
Date Time Interrogation Session: 20210627235913
Implantable Pulse Generator Implant Date: 20200721

## 2019-11-10 NOTE — Progress Notes (Signed)
[facility] Summary Report / Loop Recorder 

## 2019-11-18 ENCOUNTER — Ambulatory Visit: Payer: Medicare Other | Admitting: Adult Health

## 2019-11-26 ENCOUNTER — Encounter: Payer: Self-pay | Admitting: Adult Health

## 2019-11-26 ENCOUNTER — Ambulatory Visit (INDEPENDENT_AMBULATORY_CARE_PROVIDER_SITE_OTHER): Payer: Medicare Other | Admitting: Adult Health

## 2019-11-26 ENCOUNTER — Other Ambulatory Visit: Payer: Self-pay

## 2019-11-26 VITALS — BP 167/86 | HR 86 | Ht 60.0 in | Wt 245.0 lb

## 2019-11-26 DIAGNOSIS — R7303 Prediabetes: Secondary | ICD-10-CM

## 2019-11-26 DIAGNOSIS — E785 Hyperlipidemia, unspecified: Secondary | ICD-10-CM | POA: Diagnosis not present

## 2019-11-26 DIAGNOSIS — I10 Essential (primary) hypertension: Secondary | ICD-10-CM

## 2019-11-26 DIAGNOSIS — I672 Cerebral atherosclerosis: Secondary | ICD-10-CM | POA: Diagnosis not present

## 2019-11-26 DIAGNOSIS — I639 Cerebral infarction, unspecified: Secondary | ICD-10-CM | POA: Diagnosis not present

## 2019-11-26 DIAGNOSIS — I669 Occlusion and stenosis of unspecified cerebral artery: Secondary | ICD-10-CM

## 2019-11-26 NOTE — Progress Notes (Signed)
I agree with the above plan 

## 2019-11-26 NOTE — Progress Notes (Signed)
[facility] [address] [place]. [place] [address] [phone]       OFFICE FOLLOW UP NOTE  [name] Date of Birth:  [birth year] Medical Record Number:  [id number]   Reason for Referral: stroke follow up    CHIEF COMPLAINT:  Chief Complaint  Patient presents with  . Follow-up    Rm 9 here for a stroke f/u. Pt has no new sx and is with her husband    HPI:  Today, 11/26/2019, Ms. Schaben returns for stroke follow-up accompanied by her husband.  She has been stable since prior visit with residual mild imbalance and mild decreased left hand dexterity but overall great improvement. Will use cane inside of home and RW outdoors -denies any recent falls.  Denies new stroke/TIA symptoms.  Remains on aspirin 81 mg daily and atorvastatin for secondary stroke prevention without side effects.  Blood pressure today elevated but monitors at home and typically stable.  Loop recorder has not shown atrial fibrillation thus far.  Repeat CTA showed severe right MCA M2 stenosis with evaluation by Dr. Estanislado Pandy who recommended image guided diagnostic cerebral arteriogram 3 months from CTA (around 07/2019) but apparently they have had difficulty contacting office.  She is questioning need of procedure as she has been feeling good and is asymptomatic.  No concerns at this time.    History provided for reference purposes only Update 05/20/2019: Ms. Ballew is a 69 year old female who is being seen today for stroke follow-up.  Residual deficits of mild LUE weakness and mild imbalance but overall greatly improving.  She does continue to ambulate with Rollator walker outdoors and will use four-point cane in her home.  Denies any recent falls she has recently completed PT/OT but plans on starting additional PT at [facility] physical therapy.  Repeat lower extremity ultrasound showed resolution of acute DVT therefore Eliquis discontinued and was initiated on aspirin 81 mg daily.  She has  continued on aspirin without bleeding or bruising.  Continues on atorvastatin without myalgias.  She plans on following up with her PCP in the near future for repeat lab work.  Blood pressure today 139/67.  Loop recorder is not shown atrial fibrillation thus far.  Denies new or worsening stroke/TIA symptoms.  Initial visit 02/04/2019: Ms. Philippi is being seen today, 02/04/2019, for hospital follow-up accompanied by her daughter.  Residual deficits left hemiparesis with improvement.  Denies residual vision or speech difficulties.  She will be completing OT shortly and continues to participate in home health PT.  Currently ambulates with rolling walker but in the process of transitioning to cane inside the house with assistance of PT.  Currently living with her husband but does have assistance of her daughters when needed.  She is becoming more independent with ADLs.  Currently on aspirin 81 mg daily and Eliquis 5 mg twice daily without bleeding or bruising.  Continues on atorvastatin 40 mg daily without myalgias.  Blood pressure today 146/82 but does monitor at home and typically lower.  Recently started on sertraline by PCP for increased anxiety.  Loop recorder is not shown atrial fibrillation thus far.  Denies new or worsening stroke/TIA symptoms.  Stroke admission 11/29/2018: Ms.Brit Carbonell a 69 y.o.femalewith a history of pre diabetes and asthma but on no medications, presented to [facility] ED on 11/29/2018 withleft-sided tingling and right gaze preference.  Stroke work-up revealed large right MCA M2 punctate left frontal temporal infarcts as evidenced on MRI status post TPA and IR with right MCA TICI  2B revascularization embolic pattern secondary to unknown etiology.  Post TPA imaging showed small hemorrhage stable.  TEE unremarkable therefore loop recorder placed for long-term monitoring of atrial fibrillation and stroke etiology.  Initiated aspirin 81 mg daily.  No DAPT due to post TPA hemorrhage.  HTN  stable long-term BP goal normotensive range.  LDL 103 initiated atorvastatin 40 mg daily.  Pre-DM with A1c 5.9.  Other stroke risk factors include advanced age and morbid obesity but no prior history of stroke.  Residual deficits from right gaze preference, left homonymous hemianopia, mild dysarthria, mild left facial weakness and left hemiparesis.  Discharged to CIR for ongoing therapies.   CT head - Acute posterior right MCA infarct with hyperdense right M2 branches.  CTA H&N -Proximal right M2 occlusion.  CT Penumbra - Associated acute right MCA infarct with penumbra.   Cerebral angio TICI2b+ revascularization of occluded Rt MCA inf division with x 1 pass embotrap and penumbra.  Post IR CT no gross hmg, contrast blush present R post frontal parietal region  MRI head -mod R MCA infarct w/ confluent petechial hmg. 2 small L punctate Frontotemporal infarcts   Repeat stat CT 7/20 stable, no significant change  2D Echo EF > 65%. No source of embolus  TEEno PFO, no SOE  Loopplaced 12/02/2018  [name] Virus 2 - negative  LDL- 103  HgbA1c- 5.9  No antithromboticprior to admission, now on aspirin 81 mg dailypost tPA  Therapy recommendations:CIR  Disposition: CIR  During CIR admission, repeat LE venous Doppler showed progression of acute DVT from left PTV to left popliteal vein therefore recommended anticoagulation with Eliquis 5 mg twice daily as repeat CT head stable without new bleeding or hematoma.  She was discharged home with recommendation of home health therapy on 12/26/2018.   ROS:   14 system review of systems performed and negative with exception of imbalance  PMH:  Past Medical History:  Diagnosis Date  . Arthritis    osteoarthritis in  both knees  . Asthma   . Difficult intravenous access   . GERD (gastroesophageal reflux disease)   . Heart murmur    born with years ago  . Pre-diabetes    last A1C=6.0 per pt  . Torn meniscus    left knee w  fracture x2 to left knee  . Wears glasses     PSH:  Past Surgical History:  Procedure Laterality Date  . BUBBLE STUDY  12/02/2018   Procedure: BUBBLE STUDY;  Surgeon: Elouise Munroe, MD;  Location: [facility];  Service: Cardiology;;  . CHOLECYSTECTOMY    . CHONDROPLASTY Right 07/09/2017   Procedure: CHONDROPLASTY;  Surgeon: Sydnee Cabal, MD;  Location: [facility];  Service: Orthopedics;  Laterality: Right;  . HERNIA REPAIR Right age 1   inguinal  . IR CT HEAD LTD  11/29/2018  . IR PERCUTANEOUS ART THROMBECTOMY/INFUSION INTRACRANIAL INC DIAG ANGIO  11/29/2018  . IR RADIOLOGIST EVAL & MGMT  06/18/2019  . KNEE ARTHROSCOPY WITH MEDIAL MENISECTOMY Left 01/10/2017   Procedure: LEFT KNEE ARTHROSCOPY, DEBRIDEMENT, PARTIAL MEDIAL MENISCECTOMY, CHONDROPLASTY, ARTHROSCOPIC ASSISTED INTERNAL FIXATION OF MEDIAL FEMORAL CONDYLE AND MEDIAL TIBIAL PLATEAU INSUFFICIENCY FRACTURES;  Surgeon: Sydnee Cabal, MD;  Location: [facility];  Service: Orthopedics;  Laterality: Left;  . KNEE ARTHROSCOPY WITH MEDIAL MENISECTOMY Right 07/09/2017   Procedure: Right knee scope, debridement, chondroplasty, partial meniscectomy, internal arthroscopic assisted internal fixation of medial tibial plateau fracture;  Surgeon: Sydnee Cabal, MD;  Location: [facility];  Service: Orthopedics;  Laterality: Right;  . LOOP RECORDER INSERTION N/A 12/02/2018   Procedure: LOOP RECORDER INSERTION;  Surgeon: Evans Lance, MD;  Location: [facility];  Service: Cardiovascular;  Laterality: N/A;  . RADIOLOGY WITH ANESTHESIA N/A 11/29/2018   Procedure: RADIOLOGY WITH ANESTHESIA;  Surgeon: Luanne Bras, MD;  Location: [facility];  Service: Radiology;  Laterality: N/A;  . TEE WITHOUT CARDIOVERSION N/A 12/02/2018   Procedure: TRANSESOPHAGEAL ECHOCARDIOGRAM (TEE);  Surgeon: Elouise Munroe, MD;  Location: [facility];  Service: Cardiology;  Laterality: N/A;  . TUBAL LIGATION  age  27    Social History:  Social History   Socioeconomic History  . Marital status: Married    Spouse name: Not on file  . Number of children: Not on file  . Years of education: Not on file  . Highest education level: Not on file  Occupational History  . Not on file  Tobacco Use  . Smoking status: Never Smoker  . Smokeless tobacco: Never Used  Vaping Use  . Vaping Use: Never used  Substance and Sexual Activity  . Alcohol use: No  . Drug use: No  . Sexual activity: Not on file  Other Topics Concern  . Not on file  Social History Narrative  . Not on file   Social Determinants of Health   Financial Resource Strain:   . Difficulty of Paying Living Expenses:   Food Insecurity:   . Worried About Charity fundraiser in the Last Year:   . Arboriculturist in the Last Year:   Transportation Needs:   . Film/video editor (Medical):   Marland Kitchen Lack of Transportation (Non-Medical):   Physical Activity:   . Days of Exercise per Week:   . Minutes of Exercise per Session:   Stress:   . Feeling of Stress :   Social Connections:   . Frequency of Communication with Friends and Family:   . Frequency of Social Gatherings with Friends and Family:   . Attends Religious Services:   . Active Member of Clubs or Organizations:   . Attends Archivist Meetings:   Marland Kitchen Marital Status:   Intimate Partner Violence:   . Fear of Current or Ex-Partner:   . Emotionally Abused:   Marland Kitchen Physically Abused:   . Sexually Abused:     Family History:  Family History  Problem Relation Age of Onset  . Stroke Father   . Stroke Brother     Medications:   Current Outpatient Medications on File Prior to Visit  Medication Sig Dispense Refill  . aspirin EC 81 MG tablet Take 81 mg by mouth daily.    Marland Kitchen atorvastatin (LIPITOR) 40 MG tablet Take 1 tablet (40 mg total) by mouth daily at 6 PM. 30 tablet 1  . Calcium Carbonate (CALCIUM 600 PO) Take 1 tablet by mouth daily. 1    . diclofenac Sodium (VOLTAREN)  1 % GEL APPLY 2 G TOPICALLY 4 (FOUR) TIMES DAILY. TO LEFT KNEE 300 g 2  . famotidine (PEPCID) 10 MG tablet Take 10 mg by mouth daily.    . Multiple Vitamin (MULTIVITAMIN) tablet Take 1 tablet by mouth daily.    . sertraline (ZOLOFT) 25 MG tablet Take 25 mg by mouth daily.     No current facility-administered medications on file prior to visit.    Allergies:  No Known Allergies   Physical Exam  Vitals:   11/26/19 1008 11/26/19 1009  BP: (!) 172/79 (!) 167/86  Pulse: 88 86  Weight: 245 lb (111.1 kg)   Height: 5' (1.524 m)    Body mass index is 47.85 kg/m. No exam data present  General: Obese pleasant middle-age Caucasian female, seated, in no evident distress Head: head normocephalic and atraumatic.   Neck: supple with no carotid or supraclavicular bruits Cardiovascular: regular rate and rhythm, no murmurs Musculoskeletal: no deformity Skin:  no rash/petichiae Vascular:  Normal pulses all extremities   Neurologic Exam Mental Status: Awake and fully alert.   Fluent speech and language.  Oriented to place and time. Recent and remote memory intact. Attention span, concentration and fund of knowledge appropriate. Mood and affect appropriate.  Cranial Nerves: Pupils equal, briskly reactive to light. Extraocular movements full without nystagmus. Visual fields full to confrontation.   Hearing intact. Facial sensation intact.   Motor: Normal bulk and tone.  Full strength in all tested extremities Sensory.: intact to touch , pinprick , position and vibratory sensation.  Coordination: Rapid alternating movements normal in all extremities except decreased mildly left hand dexterity. Finger-to-nose and heel-to-shin performed accurately bilaterally.  Orbits right arm on the left arm Gait and Station: Arises from chair without difficulty. Stance is normal. Gait demonstrates  wide-based gait with assistance of Rollator walker Reflexes: 1+ and symmetric. Toes downgoing.       ASSESSMENT:  Tonyia Marschall is a 69 y.o. year old female presented with left-sided tingling and right gaze preference on 11/29/2018 with stroke work-up revealing large right MCA and 2 punctate left frontotemporal infarcts s/p TPA and IR with right MCA revascularization secondary to unknown etiology.  s/p loop recorder.  History of DVT treated with Eliquis.   Vascular risk factors include HTN, HLD, pre-DM and morbid obesity.    PLAN:  1. Cryptogenic stroke:  -Residual deficits: Mild LUE weakness and imbalance.  Ongoing improvement.  Due to intolerance of long distance ambulation due to imbalance and gait impairment post stroke, handicap placard paperwork provided per patient request -continue aspirin 81 mg daily  and Lipitor for secondary stroke prevention.    -Continue to monitor loop recorder for atrial fibrillation -Ongoing close follow-up with PCP for aggressive stroke risk factor management 2. Occlusion of right MCA: s/p thrombectomy.  Repeat CTA continue patency of revascularized right M2 inferior division for severe distal right M2 branch vessel stenosis.  Dr. Estanislado Pandy recommended proceeding with diagnostic cerebral arteriogram but per patient and husband, they have had difficulty contacting office.  Will reach out to office to request contacting patient -she is questioning needed procedure at this time vs surveillance monitoring with CTA as she has been asymptomatic and doing very well.  Advised to speak further with IR regarding this 3. HTN: BP goal<130/90.  Continue to follow with PCP for monitoring and management 4. HLD: LDL goal<70.  Continue atorvastatin and ongoing follow-up with PCP for prescribing, monitoring and management 5. Pre- DMII: Continue to follow with PCP for ongoing monitoring 6. Hx LLE DVT: Completed course of anticoagulation with follow-up ultrasound 03/2019 showed resolution of prior DVT   Follow up in 6 months or call earlier if needed   I spent 35 minutes of face-to-face and  non-face-to-face time with patient and husband.  This included previsit chart review, lab review, study review, order entry, electronic health record documentation, patient education regarding prior stroke, intracranial stenosis, importance of managing stroke risk factors, residual deficits and answered all questions to patient and husband satisfaction  Frann Rider, AGNP-BC  [facility] [address] [place],  [place] [address]  Phone [phone] Fax [phone] Note: This document was prepared with digital dictation and possible smart phrase technology. Any transcriptional errors that result from this process are unintentional.

## 2019-11-26 NOTE — Patient Instructions (Signed)
Continue aspirin 81 mg daily  and atorvastatin for secondary stroke prevention  Loop recorder will continue to be monitored for possible atrial fibrillation  Will follow up with Dr. Estanislado Pandy regarding narrowing in your vessels - you will be called for update  Continue to follow closely with PCP for cholesterol and blood pressure management and maintain strict control of hypertension with blood pressure goal below 130/90 and cholesterol with LDL cholesterol (bad cholesterol) goal below 70 mg/dL.   Followup in the future with me in 6 months or call earlier if needed       Thank you for coming to see [place] at [facility]. I hope we have been able to provide you high quality care today.  You may receive a patient satisfaction survey over the next few weeks. We would appreciate your feedback and comments so that we may continue to improve ourselves and the health of our patients.

## 2019-11-27 ENCOUNTER — Other Ambulatory Visit (HOSPITAL_COMMUNITY): Payer: Self-pay | Admitting: Interventional Radiology

## 2019-11-27 DIAGNOSIS — I639 Cerebral infarction, unspecified: Secondary | ICD-10-CM

## 2019-12-02 ENCOUNTER — Other Ambulatory Visit: Payer: Self-pay

## 2019-12-02 ENCOUNTER — Ambulatory Visit (HOSPITAL_COMMUNITY)
Admission: RE | Admit: 2019-12-02 | Discharge: 2019-12-02 | Disposition: A | Discharge status: Home / Self Care | Payer: Medicare Other | Source: Ambulatory Visit | Attending: Interventional Radiology | Admitting: Interventional Radiology

## 2019-12-02 DIAGNOSIS — I639 Cerebral infarction, unspecified: Secondary | ICD-10-CM

## 2019-12-02 NOTE — Consult Note (Signed)
Chief Complaint: Patient was seen in consultation today for right middle cerebral artery stenosis   Referring Physician(s): Dareen Gutzwiller    History of Present Illness: Deanna [name] is a 69 y.o. female with a past medical history as below for stroke follow-up..  The patient is status post endovascular revascularization of occlusive right noticeable artery causing acute ischemic stroke of the right cerebral hemisphere in November 16, 2018.  ATICI 2c vascularization was obtained with aspiration and retrieval device per  The patient subsequently underwent extensive physical therapy and Occupational Therapy with significant return of her cognitive and motor functions.  She had a CT angiogram of the head and neck performed in January 2021 which revealed a right middle several artery inferior division stenosis which was reported as being severe. The patient was seen in consultation in January 2021 at which time it was decided to do a follow-up CT angiogram at 57months from 06/04/2019.Marland Kitchen However for some reason the patient was not scheduled for this study.  Believe this could have been related to the COVID-19 situation at the time.  Patient was seen by neurology about a week ago..  The patient is here with her husband to discuss subsequent follow-up regarding the right middle cerebral artery stenosis..  Clinically the patient is significantly improved with an MRS score of almost 0.  Her only clinical neurological difficulty is that of gait instability and imbalance related to occasional inversion of the left foot.  The patient does use a cane at home and a walker outside for precautionary measures against falling.  Otherwise she denies any headaches nausea vomiting visual motor sensory coordination difficulties.      Past Medical History:  Diagnosis Date  . Arthritis    osteoarthritis in  both knees  . Asthma   . Difficult intravenous access   . GERD (gastroesophageal reflux disease)     . Heart murmur    born with years ago  . Pre-diabetes    last A1C=6.0 per pt  . Torn meniscus    left knee w fracture x2 to left knee  . Wears glasses     Past Surgical History:  Procedure Laterality Date  . BUBBLE STUDY  12/02/2018   Procedure: BUBBLE STUDY;  Surgeon: Elouise Munroe, MD;  Location: [facility];  Service: Cardiology;;  . CHOLECYSTECTOMY    . CHONDROPLASTY Right 07/09/2017   Procedure: CHONDROPLASTY;  Surgeon: Sydnee Cabal, MD;  Location: [facility];  Service: Orthopedics;  Laterality: Right;  . HERNIA REPAIR Right age 53   inguinal  . IR CT HEAD LTD  11/29/2018  . IR PERCUTANEOUS ART THROMBECTOMY/INFUSION INTRACRANIAL INC DIAG ANGIO  11/29/2018  . IR RADIOLOGIST EVAL & MGMT  06/18/2019  . KNEE ARTHROSCOPY WITH MEDIAL MENISECTOMY Left 01/10/2017   Procedure: LEFT KNEE ARTHROSCOPY, DEBRIDEMENT, PARTIAL MEDIAL MENISCECTOMY, CHONDROPLASTY, ARTHROSCOPIC ASSISTED INTERNAL FIXATION OF MEDIAL FEMORAL CONDYLE AND MEDIAL TIBIAL PLATEAU INSUFFICIENCY FRACTURES;  Surgeon: Sydnee Cabal, MD;  Location: [facility];  Service: Orthopedics;  Laterality: Left;  . KNEE ARTHROSCOPY WITH MEDIAL MENISECTOMY Right 07/09/2017   Procedure: Right knee scope, debridement, chondroplasty, partial meniscectomy, internal arthroscopic assisted internal fixation of medial tibial plateau fracture;  Surgeon: Sydnee Cabal, MD;  Location: [facility];  Service: Orthopedics;  Laterality: Right;  . LOOP RECORDER INSERTION N/A 12/02/2018   Procedure: LOOP RECORDER INSERTION;  Surgeon: Evans Lance, MD;  Location: [facility];  Service: Cardiovascular;  Laterality: N/A;  . RADIOLOGY WITH ANESTHESIA N/A 11/29/2018  Procedure: RADIOLOGY WITH ANESTHESIA;  Surgeon: Luanne Bras, MD;  Location: [place];  Service: Radiology;  Laterality: N/A;  . TEE WITHOUT CARDIOVERSION N/A 12/02/2018   Procedure: TRANSESOPHAGEAL ECHOCARDIOGRAM (TEE);  Surgeon:  Elouise Munroe, MD;  Location: [facility];  Service: Cardiology;  Laterality: N/A;  . TUBAL LIGATION  age 7    Allergies: Patient has no known allergies.  Medications: Prior to Admission medications   Medication Sig Start Date End Date Taking? Authorizing Provider  aspirin EC 81 MG tablet Take 81 mg by mouth daily.    [provider]  atorvastatin (LIPITOR) 40 MG tablet Take 1 tablet (40 mg total) by mouth daily at 6 PM. 12/26/18   Love, Ivan Anchors, PA-C  Calcium Carbonate (CALCIUM 600 PO) Take 1 tablet by mouth daily. 1    [provider]  diclofenac Sodium (VOLTAREN) 1 % GEL APPLY 2 G TOPICALLY 4 (FOUR) TIMES DAILY. TO LEFT KNEE 05/01/19   Kirsteins, Luanna Salk, MD  famotidine (PEPCID) 10 MG tablet Take 10 mg by mouth daily.    [provider]  Multiple Vitamin (MULTIVITAMIN) tablet Take 1 tablet by mouth daily.    [provider]  sertraline (ZOLOFT) 25 MG tablet Take 25 mg by mouth daily. 01/26/19   [provider]     Family History  Problem Relation Age of Onset  . Stroke Father   . Stroke Brother     Social History   Socioeconomic History  . Marital status: Married    Spouse name: Not on file  . Number of children: Not on file  . Years of education: Not on file  . Highest education level: Not on file  Occupational History  . Not on file  Tobacco Use  . Smoking status: Never Smoker  . Smokeless tobacco: Never Used  Vaping Use  . Vaping Use: Never used  Substance and Sexual Activity  . Alcohol use: No  . Drug use: No  . Sexual activity: Not on file  Other Topics Concern  . Not on file  Social History Narrative  . Not on file   Social Determinants of Health   Financial Resource Strain:   . Difficulty of Paying Living Expenses:   Food Insecurity:   . Worried About Charity fundraiser in the Last Year:   . Arboriculturist in the Last Year:   Transportation Needs:   . Film/video editor (Medical):   Marland Kitchen Lack of  Transportation (Non-Medical):   Physical Activity:   . Days of Exercise per Week:   . Minutes of Exercise per Session:   Stress:   . Feeling of Stress :   Social Connections:   . Frequency of Communication with Friends and Family:   . Frequency of Social Gatherings with Friends and Family:   . Attends Religious Services:   . Active Member of Clubs or Organizations:   . Attends Archivist Meetings:   Marland Kitchen Marital Status:      Review of Systems: A 12 point ROS discussed and pertinent positives are indicated in the HPI above.  All other systems are negative.  Review of Systems  Eyes: Negative for visual disturbance.  Respiratory: Negative for cough, chest tightness, shortness of breath and wheezing.   Cardiovascular: Negative for chest pain and leg swelling.  Gastrointestinal: Negative for abdominal pain, blood in stool, constipation and diarrhea.  Endocrine: Negative for polyuria.  Genitourinary: Negative for difficulty urinating, dysuria, frequency and hematuria.  Neurological:  Negative for dizziness, seizures and syncope.  Psychiatric/Behavioral: Negative for behavioral problems and confusion.    Vital Signs: LMP  (LMP Unknown)   Physical Exam Constitutional:      Appearance: Normal appearance.  Eyes:     Extraocular Movements: Extraocular movements intact.     Pupils: Pupils are equal, round, and reactive to light.  Neurological:     General: No focal deficit present.     Mental Status: She is alert and oriented to person, place, and time.  Psychiatric:        Mood and Affect: Mood normal.   .     Imaging: CUP PACEART REMOTE DEVICE CHECK  Result Date: 11/09/2019 [facility] summary report received. Battery status OK. Normal device function. No new symptom episodes, brady, or pause episodes. 2 tachy events, longest 8 mins appears SVT 160-180's.  No new AF episodes. Monthly summary reports and ROV/PRN   Labs:  CBC: Recent Labs    12/08/18 0932  12/09/18 0642 12/10/18 0506 12/22/18 0917  WBC 9.4 7.9 8.2 5.9  HGB 14.4 13.6 13.7 13.5  HCT 41.9 40.9 39.9 40.4  PLT 332 328 337 298    COAGS: No results for input(s): INR, APTT in the last 8760 hours.  BMP: Recent Labs    12/03/18 0514 12/08/18 0932 12/15/18 0650 12/22/18 0917  NA 138 139 137 138  K 3.9 4.2 4.4 3.9  CL 103 103 103 101  CO2 25 26 22 25   GLUCOSE 124* 165* 121* 180*  BUN 12 11 11 12   CALCIUM 9.0 9.1 9.1 9.1  CREATININE 0.85 0.87 0.87 1.00  GFRNONAA >60 >60 >60 57*  GFRAA >60 >60 >60 >60    LIVER FUNCTION TESTS: Recent Labs    12/03/18 0514  BILITOT 1.4*  AST 21  ALT 21  ALKPHOS 64  PROT 6.4*  ALBUMIN 3.5    TUMOR MARKERS: No results for input(s): AFPTM, CEA, CA199, CHROMGRNA in the last 8760 hours.  Assessment and Plan:   The patient's CT angiogram of the head and neck of 06/04/2019 was reviewed as was her immediate post revascularization cerebral arteriogram The former wasreported as showing severe distal right M2 branch vessel stenosis with attenuation of distal branch vessels corresponding to the large middle cerebral artery ischemic stroke.Marland Kitchen  Discussion ensued regarding follow-up CT angiogram of the head and neck for follow-up versus the gold standard cerebral arteriogram.  The pros and cons of both the studies were reviewed with the patient husband and the daughter over the phone.  It was finally decided to proceed with a CT angiogram of the head and neck within the next few days.  Depending on the findings of that study, subsequent management will be discussed.  In the meantime the patient has been advised to continue taking her present medication including 1 baby aspirin per day, and to continue to be active as tolerated.  She was also asked to maintain hydration.  Plan for follow-up  Informed patient that our schedulers will call patient regarding scheduling a CT angiogram of the head and neck  All questions answered and concerns  addressed. Patient conveys understanding and agrees with plan.  Thank you for this interesting consult.  I greatly enjoyed meeting Lachrisha Ziebarth and look forward to participating in their care.  A copy of this report was sent to the requesting provider on this date.  Electronically Signed: Rob Hickman, MD 12/02/2019, 2:50 PM   I spent a total of   30 minutes in  face to face in clinical consultation, greater than 50% of which was counseling/coordinating care for imaging findings, secondary stroke prevention measures and subsequent follow-up.

## 2019-12-07 ENCOUNTER — Encounter: Payer: Self-pay | Admitting: Adult Health

## 2019-12-08 ENCOUNTER — Other Ambulatory Visit (HOSPITAL_COMMUNITY): Payer: Self-pay | Admitting: Interventional Radiology

## 2019-12-08 DIAGNOSIS — I639 Cerebral infarction, unspecified: Secondary | ICD-10-CM

## 2019-12-08 NOTE — Telephone Encounter (Signed)
Pershing Proud, RN We have not gotten the authorization back from her insurance company yet. I will call her and let her know. Thanks!

## 2019-12-14 ENCOUNTER — Ambulatory Visit (INDEPENDENT_AMBULATORY_CARE_PROVIDER_SITE_OTHER): Payer: Medicare Other | Admitting: *Deleted

## 2019-12-14 DIAGNOSIS — I639 Cerebral infarction, unspecified: Secondary | ICD-10-CM

## 2019-12-14 LAB — CUP PACEART REMOTE DEVICE CHECK
Date Time Interrogation Session: 20210731000319
Implantable Pulse Generator Implant Date: 20200721

## 2019-12-17 ENCOUNTER — Encounter (HOSPITAL_COMMUNITY): Payer: Self-pay

## 2019-12-17 ENCOUNTER — Ambulatory Visit (HOSPITAL_COMMUNITY): Admission: RE | Admit: 2019-12-17 | Payer: Medicare Other | Source: Ambulatory Visit

## 2019-12-17 ENCOUNTER — Other Ambulatory Visit: Payer: Self-pay

## 2019-12-17 ENCOUNTER — Ambulatory Visit (HOSPITAL_COMMUNITY)
Admission: RE | Admit: 2019-12-17 | Discharge: 2019-12-17 | Disposition: A | Discharge status: Home / Self Care | Payer: Medicare Other | Source: Ambulatory Visit | Attending: Interventional Radiology | Admitting: Interventional Radiology

## 2019-12-17 DIAGNOSIS — I639 Cerebral infarction, unspecified: Secondary | ICD-10-CM | POA: Diagnosis not present

## 2019-12-17 LAB — POCT I-STAT CREATININE: Creatinine, Ser: 0.9 mg/dL (ref 0.44–1.00)

## 2019-12-17 MED ORDER — IOHEXOL 350 MG/ML SOLN
75.0000 mL | Freq: Once | INTRAVENOUS | Status: AC | PRN
  Administered 2019-12-17: 75 mL via INTRAVENOUS

## 2019-12-17 NOTE — Progress Notes (Signed)
[facility] Summary Report / Loop Recorder 

## 2019-12-18 ENCOUNTER — Other Ambulatory Visit (HOSPITAL_COMMUNITY): Payer: Self-pay | Admitting: Interventional Radiology

## 2019-12-18 DIAGNOSIS — I639 Cerebral infarction, unspecified: Secondary | ICD-10-CM

## 2019-12-23 ENCOUNTER — Other Ambulatory Visit: Payer: Self-pay

## 2019-12-23 ENCOUNTER — Ambulatory Visit (HOSPITAL_COMMUNITY)
Admission: RE | Admit: 2019-12-23 | Discharge: 2019-12-23 | Disposition: A | Discharge status: Home / Self Care | Payer: Medicare Other | Source: Ambulatory Visit | Attending: Interventional Radiology | Admitting: Interventional Radiology

## 2019-12-23 DIAGNOSIS — I639 Cerebral infarction, unspecified: Secondary | ICD-10-CM

## 2019-12-24 NOTE — Consult Note (Signed)
Chief Complaint: Patient was seen in consultation today to discuss the results of the CT angiogram of the head and neck of 12/17/2019.   Referring Physician(s): Audie Wieser   History of Present Illness: Deanna [name] is a 69 y.o. 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  The patient is accompanied by her husband, and also via phone with her daughter.  Clinically the patient remained stable without any new symptoms worsening weakness on the left side speech difficulties balance difficulties, visual aberrations, speech difficulties, coordination difficulties.  No gross symptomatology in the brief systems reviewed  Past Medical History:  Diagnosis Date  . Arthritis    osteoarthritis in  both knees  . Asthma   . Difficult intravenous access   . GERD (gastroesophageal reflux disease)   . Heart murmur    born with years ago  . Pre-diabetes    last A1C=6.0 per pt  . Torn meniscus    left knee w fracture x2 to left knee  . Wears glasses     Past Surgical History:  Procedure Laterality Date  . BUBBLE STUDY  12/02/2018   Procedure: BUBBLE STUDY;  Surgeon: Elouise Munroe, MD;  Location: [place];  Service: Cardiology;;  . CHOLECYSTECTOMY    . CHONDROPLASTY Right 07/09/2017   Procedure: CHONDROPLASTY;  Surgeon: Sydnee Cabal, MD;  Location: [facility];  Service: Orthopedics;  Laterality: Right;  . HERNIA REPAIR Right age 8   inguinal  . IR CT HEAD LTD  11/29/2018  . IR PERCUTANEOUS ART THROMBECTOMY/INFUSION INTRACRANIAL INC DIAG ANGIO  11/29/2018  . IR RADIOLOGIST EVAL & MGMT  06/18/2019  . KNEE ARTHROSCOPY WITH MEDIAL MENISECTOMY Left 01/10/2017   Procedure: LEFT KNEE ARTHROSCOPY, DEBRIDEMENT, PARTIAL MEDIAL MENISCECTOMY, CHONDROPLASTY, ARTHROSCOPIC ASSISTED INTERNAL FIXATION OF MEDIAL FEMORAL CONDYLE AND MEDIAL  TIBIAL PLATEAU INSUFFICIENCY FRACTURES;  Surgeon: Sydnee Cabal, MD;  Location: [place];  Service: Orthopedics;  Laterality: Left;  . KNEE ARTHROSCOPY WITH MEDIAL MENISECTOMY Right 07/09/2017   Procedure: Right knee scope, debridement, chondroplasty, partial meniscectomy, internal arthroscopic assisted internal fixation of medial tibial plateau fracture;  Surgeon: Sydnee Cabal, MD;  Location: [facility];  Service: Orthopedics;  Laterality: Right;  . LOOP RECORDER INSERTION N/A 12/02/2018   Procedure: LOOP RECORDER INSERTION;  Surgeon: Evans Lance, MD;  Location: [place] [facility];  Service: Cardiovascular;  Laterality: N/A;  . RADIOLOGY WITH ANESTHESIA N/A 11/29/2018   Procedure: RADIOLOGY WITH ANESTHESIA;  Surgeon: Luanne Bras, MD;  Location: [place];  Service: Radiology;  Laterality: N/A;  . TEE WITHOUT CARDIOVERSION N/A 12/02/2018   Procedure: TRANSESOPHAGEAL ECHOCARDIOGRAM (TEE);  Surgeon: Elouise Munroe, MD;  Location: [place];  Service: Cardiology;  Laterality: N/A;  . TUBAL LIGATION  age 26    Allergies: Patient has no known allergies.  Medications: Prior to Admission medications   Medication Sig Start Date End Date Taking? Authorizing Provider  aspirin EC 81 MG tablet Take 81 mg by mouth daily.    [provider]  atorvastatin (LIPITOR) 40 MG tablet Take 1 tablet (40 mg total) by mouth daily at 6 PM. 12/26/18   Love, Ivan Anchors, PA-C  Calcium Carbonate (CALCIUM 600 PO) Take 1 tablet by mouth daily. 1    [provider]  diclofenac Sodium (VOLTAREN) 1 % GEL APPLY 2 G TOPICALLY 4 (FOUR) TIMES DAILY. TO LEFT KNEE 05/01/19   Kirsteins, Mitzi Hansen  E, MD  famotidine (PEPCID) 10 MG tablet Take 10 mg by mouth daily.    [provider]  Multiple Vitamin (MULTIVITAMIN) tablet Take 1 tablet by mouth daily.    [provider]  sertraline (ZOLOFT) 25 MG tablet Take 25 mg by mouth daily. 01/26/19   [provider]     Family History  Problem Relation Age of Onset  . Stroke Father   . Stroke Brother     Social History   Socioeconomic History  . Marital status: Married    Spouse name: Not on file  . Number of children: Not on file  . Years of education: Not on file  . Highest education level: Not on file  Occupational History  . Not on file  Tobacco Use  . Smoking status: Never Smoker  . Smokeless tobacco: Never Used  Vaping Use  . Vaping Use: Never used  Substance and Sexual Activity  . Alcohol use: No  . Drug use: No  . Sexual activity: Not on file  Other Topics Concern  . Not on file  Social History Narrative  . Not on file   Social Determinants of Health   Financial Resource Strain:   . Difficulty of Paying Living Expenses:   Food Insecurity:   . Worried About Charity fundraiser in the Last Year:   . Arboriculturist in the Last Year:   Transportation Needs:   . Film/video editor (Medical):   Marland Kitchen Lack of Transportation (Non-Medical):   Physical Activity:   . Days of Exercise per Week:   . Minutes of Exercise per Session:   Stress:   . Feeling of Stress :   Social Connections:   . Frequency of Communication with Friends and Family:   . Frequency of Social Gatherings with Friends and Family:   . Attends Religious Services:   . Active Member of Clubs or Organizations:   . Attends Archivist Meetings:   Marland Kitchen Marital Status:      Review of Systems: A 12 point ROS discussed and pertinent positives are indicated in the HPI above.  All other systems are negative.  Review of Systems.  As above.  Vital Signs: LMP  (LMP Unknown)   Physical Exam.  Deferred.   Imaging: CT ANGIO HEAD W OR WO CONTRAST  Result Date: 12/17/2019 CLINICAL DATA:  Cerebrovascular accident (CVA), unspecified mechanism. EXAM: CT ANGIOGRAPHY HEAD AND NECK TECHNIQUE: Multidetector CT imaging of the head and neck was performed using the standard protocol during bolus  administration of intravenous contrast. Multiplanar CT image reconstructions and MIPs were obtained to evaluate the vascular anatomy. Carotid stenosis measurements (when applicable) are obtained utilizing NASCET criteria, using the distal internal carotid diameter as the denominator. CONTRAST:  31mL OMNIPAQUE IOHEXOL 350 MG/ML SOLN COMPARISON:  CT angiogram head 06/04/2019, head CT 12/08/2010, CT angiogram head/neck 11/29/2018. 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 Associated ex vacuo dilatation of the right lateral ventricle. Stable background mild patchy hypodensity within the cerebral white matter which is nonspecific, but consistent with chronic small vessel ischemic disease. Unchanged bilateral basal ganglia mineralization. There is no acute intracranial hemorrhage. No new demarcated cortical infarct is identified. No extra-axial fluid collection. No evidence of intracranial mass. No midline shift. Vascular: Reported below. Skull: Normal. Negative for fracture  or focal lesion. Sinuses: No significant paranasal sinus disease or mastoid effusion. Orbits: No acute abnormality. Review of the MIP images confirms the above findings CTA NECK FINDINGS Aortic arch: Common origin of the innominate and proximal left common carotid arteries. Mild calcified plaque within the visualized aortic arch. Streak artifact from a dense right-sided contrast bolus obscures the proximal right CCA and proximal right subclavian artery. Within this limitation, there is no appreciable hemodynamically significant stenosis of the innominate or proximal subclavian arteries. Right carotid system: Artifact from a dense right-sided contrast bolus partially obscures the proximal CCA. Within this limitation, the CCA and  ICA patent within the neck without significant stenosis (50% or greater). Moderate calcified plaque redemonstrated within the carotid bifurcation and proximal ICA. Partially retropharyngeal course of the ICA. Left carotid system: CCA and ICA patent within the neck without significant stenosis (50% or greater). Redemonstrated moderate calcified plaque within the carotid bifurcation and proximal ICA. Partially retropharyngeal course of the ICA. Vertebral arteries: The right vertebral artery is developmentally hypoplastic, but patent throughout the neck. The dominant left vertebral is patent throughout the neck without hemodynamically significant stenosis Skeleton: No acute bony abnormality or aggressive osseous lesion. Reversal of the expected cervical lordosis. Cervical spondylosis. Most notably at C5-C6 and C6-C7, there is advanced disc space narrowing with posterior disc osteophytes and uncovertebral hypertrophy. No high-grade bony spinal canal stenosis. Other neck: No neck mass or cervical lymphadenopathy. Upper chest: No consolidation within the imaged lung apices. Review of the MIP images confirms the above findings CTA HEAD FINDINGS Anterior circulation: The intracranial internal carotid arteries are patent. Mild nonstenotic calcified plaque within both vessels. The M1 middle cerebral arteries are patent without significant stenosis. No M2 proximal branch occlusion. Redemonstrated moderate/severe distal M2 branch vessel stenosis as well as attenuation of more distal right MCA branch vessels corresponding to the region of chronic infarction. The anterior cerebral arteries are patent. No intracranial aneurysm is identified. Posterior circulation: The intracranial vertebral arteries are patent. Redemonstrated developmentally diminutive basilar artery which is patent without significant stenosis. There are sizable bilateral posterior communicating arteries. Hypoplastic right P1 segment. The posterior cerebral  arteries are patent bilaterally with mild atherosclerotic irregularity but no significant stenosis. Venous sinuses: Within limitations of contrast timing, no convincing thrombus. Anatomic variants: As described Review of the MIP images confirms the above findings IMPRESSION: CT head: 1. Unchanged large chronic right MCA territory infarct. 2. Stable background mild chronic small vessel ischemic disease. CTA neck: 1. Artifact from a dense right-sided contrast bolus limits evaluation of the proximal right subclavian artery and proximal right common carotid artery. 2. Within this limitation, the bilateral common and internal carotid arteries are patent within the neck without hemodynamically significant stenosis. Redemonstrated moderate calcified plaque within the carotid bifurcations/proximal ICAs bilaterally. 3. The dominant left vertebral artery is patent throughout the neck without hemodynamically significant stenosis. 4. The right vertebral artery is developmentally diminutive, but patent throughout the neck. CTA head: 1. No significant change as compared to the CTA head exam of 06/04/2019. 2. Continued patency of the revascularized right M2 inferior division. 3. Redemonstrated moderate/severe distal right M2 branch vessel stenosis with attenuation of more distal branch vessels corresponding to the known large chronic MCA infarct. 4. No new intracranial arterial occlusion or significant proximal stenosis. Electronically Signed   By: Kellie Simmering DO   On: 12/17/2019 12:56   CT ANGIO NECK W OR WO CONTRAST  Result Date: 12/17/2019 CLINICAL DATA:  Cerebrovascular accident (CVA), unspecified mechanism. EXAM: CT ANGIOGRAPHY  HEAD AND NECK TECHNIQUE: Multidetector CT imaging of the head and neck was performed using the standard protocol during bolus administration of intravenous contrast. Multiplanar CT image reconstructions and MIPs were obtained to evaluate the vascular anatomy. Carotid stenosis measurements (when  applicable) are obtained utilizing NASCET criteria, using the distal internal carotid diameter as the denominator. CONTRAST:  15mL OMNIPAQUE IOHEXOL 350 MG/ML SOLN COMPARISON:  CT angiogram head 06/04/2019, head CT 12/08/2010, CT angiogram head/neck 11/29/2018. 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 Associated ex vacuo dilatation of the right lateral ventricle. Stable background mild patchy hypodensity within the cerebral white matter which is nonspecific, but consistent with chronic small vessel ischemic disease. Unchanged bilateral basal ganglia mineralization. There is no acute intracranial hemorrhage. No new demarcated cortical infarct is identified. No extra-axial fluid collection. No evidence of intracranial mass. No midline shift. Vascular: Reported below. Skull: Normal. Negative for fracture or focal lesion. Sinuses: No significant paranasal sinus disease or mastoid effusion. Orbits: No acute abnormality. Review of the MIP images confirms the above findings CTA NECK FINDINGS Aortic arch: Common origin of the innominate and proximal left common carotid arteries. Mild calcified plaque within the visualized aortic arch. Streak artifact from a dense right-sided contrast bolus obscures the proximal right CCA and proximal right subclavian artery. Within this limitation, there is no appreciable hemodynamically significant stenosis of the innominate or proximal subclavian arteries. Right carotid system: Artifact from a dense right-sided contrast bolus partially obscures the proximal CCA. Within this limitation, the CCA and ICA patent within the neck without significant stenosis (50% or greater). Moderate calcified plaque redemonstrated within the carotid bifurcation and proximal ICA.  Partially retropharyngeal course of the ICA. Left carotid system: CCA and ICA patent within the neck without significant stenosis (50% or greater). Redemonstrated moderate calcified plaque within the carotid bifurcation and proximal ICA. Partially retropharyngeal course of the ICA. Vertebral arteries: The right vertebral artery is developmentally hypoplastic, but patent throughout the neck. The dominant left vertebral is patent throughout the neck without hemodynamically significant stenosis Skeleton: No acute bony abnormality or aggressive osseous lesion. Reversal of the expected cervical lordosis. Cervical spondylosis. Most notably at C5-C6 and C6-C7, there is advanced disc space narrowing with posterior disc osteophytes and uncovertebral hypertrophy. No high-grade bony spinal canal stenosis. Other neck: No neck mass or cervical lymphadenopathy. Upper chest: No consolidation within the imaged lung apices. Review of the MIP images confirms the above findings CTA HEAD FINDINGS Anterior circulation: The intracranial internal carotid arteries are patent. Mild nonstenotic calcified plaque within both vessels. The M1 middle cerebral arteries are patent without significant stenosis. No M2 proximal branch occlusion. Redemonstrated moderate/severe distal M2 branch vessel stenosis as well as attenuation of more distal right MCA branch vessels corresponding to the region of chronic infarction. The anterior cerebral arteries are patent. No intracranial aneurysm is identified. Posterior circulation: The intracranial vertebral arteries are patent. Redemonstrated developmentally diminutive basilar artery which is patent without significant stenosis. There are sizable bilateral posterior communicating arteries. Hypoplastic right P1 segment. The posterior cerebral arteries are patent bilaterally with mild atherosclerotic irregularity but no significant stenosis. Venous sinuses: Within limitations of contrast timing, no convincing  thrombus. Anatomic variants: As described Review of the MIP images confirms the above findings IMPRESSION: CT head: 1. Unchanged large chronic right MCA territory infarct. 2. Stable background mild chronic  small vessel ischemic disease. CTA neck: 1. Artifact from a dense right-sided contrast bolus limits evaluation of the proximal right subclavian artery and proximal right common carotid artery. 2. Within this limitation, the bilateral common and internal carotid arteries are patent within the neck without hemodynamically significant stenosis. Redemonstrated moderate calcified plaque within the carotid bifurcations/proximal ICAs bilaterally. 3. The dominant left vertebral artery is patent throughout the neck without hemodynamically significant stenosis. 4. The right vertebral artery is developmentally diminutive, but patent throughout the neck. CTA head: 1. No significant change as compared to the CTA head exam of 06/04/2019. 2. Continued patency of the revascularized right M2 inferior division. 3. Redemonstrated moderate/severe distal right M2 branch vessel stenosis with attenuation of more distal branch vessels corresponding to the known large chronic MCA infarct. 4. No new intracranial arterial occlusion or significant proximal stenosis. Electronically Signed   By: Kellie Simmering DO   On: 12/17/2019 12:56   CUP PACEART REMOTE DEVICE CHECK  Result Date: 12/14/2019 Carelink summary report received. Battery status OK. Normal device function. No new symptom episodes, tachy episodes, brady, or pause episodes. No new AF episodes. Monthly summary reports and ROV/PRN    [place]   Labs:  CBC: No results for input(s): WBC, HGB, HCT, PLT in the last 8760 hours.  COAGS: No results for input(s): INR, APTT in the last 8760 hours.  BMP: Recent Labs    12/17/19 1023  CREATININE 0.90    LIVER FUNCTION TESTS: No results for input(s): BILITOT, AST, ALT, ALKPHOS, PROT, ALBUMIN in the last 8760 hours.  TUMOR  MARKERS: No results for input(s): AFPTM, CEA, CA199, CHROMGRNA in the last 8760 hours.  Results of the CT angiogram of the head and neck performed on 12/17/2019 and compared to the results of 06/04/2019.  The CT angiograms of the head and neck were reviewed by me.  No change is demonstrable in the previously noted moderate to severe distal right M2 branch stenosis.  The proximal right middle cerebral  artery remains widely patent.  Sequela of previous ischemic stroke involving the right cerebral hemisphere are also stable.  Plan for follow-up CT angiogram of the head and neck in 6 months time.  Should the patient develop new neurological symptoms, patient, the spouse and the daughter were informed to call 911.  They are in agreement with above management plan  Informed patient that our schedulers will call to schedule the CT angiogram of the head and neck in 6 months time.  All questions answered and concerns addressed. Patient conveys understanding and agrees with plan.  Thank you for this interesting consult.  I greatly enjoyed meeting Azuri Bozard and look forward to participating in their care.  A copy of this report was sent to the requesting provider on this date.  Electronically Signed: Rob Hickman, MD 12/24/2019, 8:43 AM   I spent a total of 20 minutes in face to face in clinical consultation, greater than 50% of which was counseling/coordinating care for for reviewing and comparing of the imaging results electronically.

## 2020-01-14 ENCOUNTER — Ambulatory Visit (INDEPENDENT_AMBULATORY_CARE_PROVIDER_SITE_OTHER): Payer: Medicare Other | Admitting: *Deleted

## 2020-01-14 DIAGNOSIS — I63511 Cerebral infarction due to unspecified occlusion or stenosis of right middle cerebral artery: Secondary | ICD-10-CM | POA: Diagnosis not present

## 2020-01-14 LAB — CUP PACEART REMOTE DEVICE CHECK
Date Time Interrogation Session: 20210902001010
Implantable Pulse Generator Implant Date: 20200721

## 2020-01-15 NOTE — Progress Notes (Signed)
[facility] Summary Report / Loop Recorder 

## 2020-01-28 ENCOUNTER — Ambulatory Visit
Admission: RE | Admit: 2020-01-28 | Discharge: 2020-01-28 | Disposition: A | Discharge status: Home / Self Care | Payer: Medicare Other | Source: Ambulatory Visit | Attending: Family Medicine | Admitting: Family Medicine

## 2020-01-28 ENCOUNTER — Other Ambulatory Visit: Payer: Self-pay

## 2020-01-28 DIAGNOSIS — E2839 Other primary ovarian failure: Secondary | ICD-10-CM

## 2020-02-16 ENCOUNTER — Other Ambulatory Visit: Payer: Self-pay

## 2020-02-16 ENCOUNTER — Ambulatory Visit (INDEPENDENT_AMBULATORY_CARE_PROVIDER_SITE_OTHER): Payer: Medicare Other

## 2020-02-16 DIAGNOSIS — I639 Cerebral infarction, unspecified: Secondary | ICD-10-CM | POA: Diagnosis not present

## 2020-02-16 LAB — CUP PACEART REMOTE DEVICE CHECK
Date Time Interrogation Session: 20211005001815
Implantable Pulse Generator Implant Date: 20200721

## 2020-02-19 NOTE — Progress Notes (Signed)
[facility] Summary Report / Loop Recorder 

## 2020-02-22 ENCOUNTER — Other Ambulatory Visit: Payer: Self-pay | Admitting: Family Medicine

## 2020-02-22 DIAGNOSIS — Z1231 Encounter for screening mammogram for malignant neoplasm of breast: Secondary | ICD-10-CM

## 2020-03-20 LAB — CUP PACEART REMOTE DEVICE CHECK
Date Time Interrogation Session: 20211107005004
Implantable Pulse Generator Implant Date: 20200721

## 2020-03-21 ENCOUNTER — Ambulatory Visit (INDEPENDENT_AMBULATORY_CARE_PROVIDER_SITE_OTHER): Payer: Medicare Other

## 2020-03-21 DIAGNOSIS — I639 Cerebral infarction, unspecified: Secondary | ICD-10-CM | POA: Diagnosis not present

## 2020-03-22 NOTE — Progress Notes (Signed)
[facility] Summary Report / Loop Recorder 

## 2020-04-23 LAB — CUP PACEART REMOTE DEVICE CHECK
Date Time Interrogation Session: 20211210014509
Implantable Pulse Generator Implant Date: 20200721

## 2020-04-25 ENCOUNTER — Ambulatory Visit (INDEPENDENT_AMBULATORY_CARE_PROVIDER_SITE_OTHER): Payer: Medicare Other

## 2020-04-25 DIAGNOSIS — I639 Cerebral infarction, unspecified: Secondary | ICD-10-CM | POA: Diagnosis not present

## 2020-05-10 NOTE — Progress Notes (Signed)
[facility] Summary Report / Loop Recorder 

## 2020-05-11 ENCOUNTER — Ambulatory Visit: Payer: Medicare Other

## 2020-05-25 ENCOUNTER — Telehealth: Payer: Self-pay

## 2020-05-25 NOTE — Telephone Encounter (Signed)
-----   Message from [facility] [place] sent at 05/24/2020  4:05 PM EST ----- SVT on linq Left message on her home number (cell voicemail is full) to call the device clinic Unable to see 2nd event until she sends a manual transmission.  Please follow up  Deanna [name]

## 2020-05-25 NOTE — Telephone Encounter (Signed)
The pt agreed to send a manual transmission in a few minutes.

## 2020-05-25 NOTE — Telephone Encounter (Signed)
Carelink alert received for 2 tachy events logged, rate ~ 170 bpm. Patient reports she was asleep and had a nightmare. Husband states the patient was screaming while she was asleep. States she woke up and felt fine. Went back to sleep and had another nightmare. Patient states she has since felt fine and no complaints.   Hand held device is giving error code. [employer] number given to patient to call and advised to call us back with update. Agreeable to plan.

## 2020-05-30 ENCOUNTER — Ambulatory Visit (INDEPENDENT_AMBULATORY_CARE_PROVIDER_SITE_OTHER): Payer: Medicare Other

## 2020-05-30 DIAGNOSIS — I639 Cerebral infarction, unspecified: Secondary | ICD-10-CM

## 2020-05-31 ENCOUNTER — Telehealth: Payer: Self-pay | Admitting: *Deleted

## 2020-05-31 NOTE — Telephone Encounter (Signed)
Called and spoke w/ husband. R/s appt for tomorrow to 06/14/20 at 7:45am with JM,NP since provider out tomorrow.

## 2020-06-01 ENCOUNTER — Ambulatory Visit: Payer: Medicare Other | Admitting: Adult Health

## 2020-06-02 LAB — CUP PACEART REMOTE DEVICE CHECK
Date Time Interrogation Session: 20220112030146
Implantable Pulse Generator Implant Date: 20200721

## 2020-06-07 ENCOUNTER — Telehealth: Payer: Self-pay

## 2020-06-07 NOTE — Telephone Encounter (Signed)
Transmission was sent 05/25/20. Advised patient I will forward to Dr. Lovena Le and we will call with changes. States her monitor is now working after Therapist, music.

## 2020-06-13 NOTE — Progress Notes (Signed)
[facility] Summary Report / Loop Recorder 

## 2020-06-13 NOTE — Telephone Encounter (Signed)
Looks like SVT. No change.

## 2020-06-14 ENCOUNTER — Other Ambulatory Visit: Payer: Self-pay

## 2020-06-14 ENCOUNTER — Encounter: Payer: Self-pay | Admitting: Adult Health

## 2020-06-14 ENCOUNTER — Ambulatory Visit (INDEPENDENT_AMBULATORY_CARE_PROVIDER_SITE_OTHER): Payer: Medicare Other | Admitting: Adult Health

## 2020-06-14 VITALS — BP 140/64 | HR 86 | Ht 61.0 in | Wt 246.0 lb

## 2020-06-14 DIAGNOSIS — I639 Cerebral infarction, unspecified: Secondary | ICD-10-CM

## 2020-06-14 NOTE — Progress Notes (Signed)
[facility] [address] [place]. [place] [address] [phone]       OFFICE FOLLOW UP NOTE  [name] Date of Birth:  12/06/1950 Medical Record Number:  [id number]   Reason for Referral: stroke follow up    CHIEF COMPLAINT:  Chief Complaint  Patient presents with  . Follow-up    Stroke fu, tx rm, with husband, states she is doing well, using walker and cane     HPI:  Today, 06/14/2020, [name] returns for 68-month stroke follow-up accompanied by her husband.  Doing well since prior visit without new stroke/TIA symptoms and reports residual mild imbalance and very slight decrease left hand dexterity.  Continues to use RW for long distance and cane for short distance around her home.  Denies any recent falls.  Remains on aspirin 81 mg daily and atorvastatin 40 mg daily for secondary stroke prevention.  She reports recent lab work completed by PCP which was satisfactory per patient.  Blood pressure today 140/64 -monitors at home and typically 120s/60s.  Loop recorder has not shown atrial fibrillation thus far.  She does report since her stroke, she has been having difficulty with her skin especially on her hands, forehead and head where at times can be itchy but is constantly flaking despite use of creams and special shampoos.  No concerns at this time.   History provided for reference purposes only Update 11/26/2019 JM: [name] returns for stroke follow-up accompanied by her husband.  She has been stable since prior visit with residual mild imbalance and mild decreased left hand dexterity but overall great improvement. Will use cane inside of home and RW outdoors -denies any recent falls.  Denies new stroke/TIA symptoms.  Remains on aspirin 81 mg daily and atorvastatin for secondary stroke prevention without side effects.  Blood pressure today elevated but monitors at home and typically stable.  Loop recorder has not shown atrial fibrillation thus far.  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 Estanislado Pandy who recommended image guided diagnostic cerebral arteriogram 3 months from CTA (around 07/2019) but apparently they have had difficulty contacting office.  She is questioning need of procedure as she has been feeling good and is asymptomatic.  No concerns at this time.  Update 05/20/2019: [name] is a 70 year old female who is being seen today for stroke follow-up.  Residual deficits of mild LUE weakness and mild imbalance but overall greatly improving.  She does continue to ambulate with Rollator walker outdoors and will use four-point cane in her home.  Denies any recent falls she has recently completed PT/OT but plans on starting additional PT at [facility] physical therapy.  Repeat lower extremity ultrasound showed resolution of acute DVT therefore Eliquis discontinued and was initiated on aspirin 81 mg daily.  She has continued on aspirin without bleeding or bruising.  Continues on atorvastatin without myalgias.  She plans on following up with her PCP in the near future for repeat lab work.  Blood pressure today 139/67.  Loop recorder is not shown atrial fibrillation thus far.  Denies new or worsening stroke/TIA symptoms.  Initial visit 02/04/2019: [name] is being seen today, 02/04/2019, for hospital follow-up accompanied by her daughter.  Residual deficits left hemiparesis with improvement.  Denies residual vision or speech difficulties.  She will be completing OT shortly and continues to participate in home health PT.  Currently ambulates with rolling walker but in the process of transitioning to cane inside the  house with assistance of PT.  Currently living with her husband but does have assistance of her daughters when needed.  She is becoming more independent with ADLs.  Currently on aspirin 81 mg daily and Eliquis 5 mg twice daily without bleeding or bruising.  Continues on atorvastatin 40 mg daily without myalgias.  Blood  pressure today 146/82 but does monitor at home and typically lower.  Recently started on sertraline by PCP for increased anxiety.  Loop recorder is not shown atrial fibrillation thus far.  Denies new or worsening stroke/TIA symptoms.  Stroke admission 11/29/2018: Ms.Deanna [name] a 70 y.o.femalewith a history of pre diabetes and asthma but on no medications, [facility] ED on 11/29/2018 withleft-sided tingling and right gaze preference.  Stroke work-up revealed large right MCA M2 punctate left frontal temporal infarcts as evidenced on MRI status post TPA and IR with right MCA TICI 2B revascularization embolic pattern secondary to unknown etiology.  Post TPA imaging showed small hemorrhage stable.  TEE unremarkable therefore loop recorder placed for long-term monitoring of atrial fibrillation and stroke etiology.  Initiated aspirin 81 mg daily.  No DAPT due to post TPA hemorrhage.  HTN stable long-term BP goal normotensive range.  LDL 103 initiated atorvastatin 40 mg daily.  Pre-DM with A1c 5.9.  Other stroke risk factors include advanced age and morbid obesity but no prior history of stroke.  Residual deficits from right gaze preference, left homonymous hemianopia, mild dysarthria, mild left facial weakness and left hemiparesis.  Discharged to CIR for ongoing therapies.   CT head - Acute posterior right MCA infarct with hyperdense right M2 branches.  CTA H&N -Proximal right M2 occlusion.  CT Penumbra - Associated acute right MCA infarct with penumbra.   Cerebral angio TICI2b+ revascularization of occluded Rt MCA inf division with x 1 pass embotrap and penumbra.  Post IR CT no gross hmg, contrast blush present R post frontal parietal region  MRI head -mod R MCA infarct w/ confluent petechial hmg. 2 small L punctate Frontotemporal infarcts   Repeat stat CT 7/20 stable, no significant change  2D Echo EF > 65%. No source of embolus  TEEno PFO, no SOE  Loopplaced 12/02/2018  Deanna [name] 2 - negative  LDL- 103  HgbA1c- 5.9  No antithromboticprior to admission, now on aspirin 81 mg dailypost tPA  Therapy recommendations:CIR  Disposition: CIR  During CIR admission, repeat LE venous Doppler showed progression of acute DVT from left PTV to left popliteal vein therefore recommended anticoagulation with Eliquis 5 mg twice daily as repeat CT head stable without new bleeding or hematoma.  She was discharged home with recommendation of home health therapy on 12/26/2018.   ROS:   14 system review of systems performed and negative with exception of those listed in HPI  PMH:  Past Medical History:  Diagnosis Date  . Arthritis    osteoarthritis in  both knees  . Asthma   . Difficult intravenous access   . GERD (gastroesophageal reflux disease)   . Heart murmur    born with years ago  . Pre-diabetes    last A1C=6.0 per pt  . Torn meniscus    left knee w fracture x2 to left knee  . Wears glasses     PSH:  Past Surgical History:  Procedure Laterality Date  . BUBBLE STUDY  12/02/2018   Procedure: BUBBLE STUDY;  Surgeon: Elouise Munroe, MD;  Location: [place];  Service: Cardiology;;  . CHOLECYSTECTOMY    .  CHONDROPLASTY Right 07/09/2017   Procedure: CHONDROPLASTY;  Surgeon: Sydnee Cabal, MD;  Location: [facility];  Service: Orthopedics;  Laterality: Right;  . HERNIA REPAIR Right age 45   inguinal  . IR CT HEAD LTD  11/29/2018  . IR PERCUTANEOUS ART THROMBECTOMY/INFUSION INTRACRANIAL INC DIAG ANGIO  11/29/2018  . IR RADIOLOGIST EVAL & MGMT  06/18/2019  . KNEE ARTHROSCOPY WITH MEDIAL MENISECTOMY Left 01/10/2017   Procedure: LEFT KNEE ARTHROSCOPY, DEBRIDEMENT, PARTIAL MEDIAL MENISCECTOMY, CHONDROPLASTY, ARTHROSCOPIC ASSISTED INTERNAL FIXATION OF MEDIAL FEMORAL CONDYLE AND MEDIAL TIBIAL PLATEAU INSUFFICIENCY FRACTURES;  Surgeon: Sydnee Cabal, MD;  Location: [place];  Service: Orthopedics;  Laterality: Left;  .  KNEE ARTHROSCOPY WITH MEDIAL MENISECTOMY Right 07/09/2017   Procedure: Right knee scope, debridement, chondroplasty, partial meniscectomy, internal arthroscopic assisted internal fixation of medial tibial plateau fracture;  Surgeon: Sydnee Cabal, MD;  Location: [facility];  Service: Orthopedics;  Laterality: Right;  . LOOP RECORDER INSERTION N/A 12/02/2018   Procedure: LOOP RECORDER INSERTION;  Surgeon: Evans Lance, MD;  Location: [place] [facility];  Service: Cardiovascular;  Laterality: N/A;  . RADIOLOGY WITH ANESTHESIA N/A 11/29/2018   Procedure: RADIOLOGY WITH ANESTHESIA;  Surgeon: Luanne Bras, MD;  Location: [place];  Service: Radiology;  Laterality: N/A;  . TEE WITHOUT CARDIOVERSION N/A 12/02/2018   Procedure: TRANSESOPHAGEAL ECHOCARDIOGRAM (TEE);  Surgeon: Elouise Munroe, MD;  Location: [place];  Service: Cardiology;  Laterality: N/A;  . TUBAL LIGATION  age 46    Social History:  Social History   Socioeconomic History  . Marital status: Married    Spouse name: Not on file  . Number of children: Not on file  . Years of education: Not on file  . Highest education level: Not on file  Occupational History  . Not on file  Tobacco Use  . Smoking status: Never Smoker  . Smokeless tobacco: Never Used  Vaping Use  . Vaping Use: Never used  Substance and Sexual Activity  . Alcohol use: No  . Drug use: No  . Sexual activity: Not on file  Other Topics Concern  . Not on file  Social History Narrative  . Not on file   Social Determinants of Health   Financial Resource Strain: Not on file  Food Insecurity: Not on file  Transportation Needs: Not on file  Physical Activity: Not on file  Stress: Not on file  Social Connections: Not on file  Intimate Partner Violence: Not on file    Family History:  Family History  Problem Relation Age of Onset  . Stroke Father   . Stroke Brother     Medications:   Current Outpatient Medications on File  Prior to Visit  Medication Sig Dispense Refill  . aspirin EC 81 MG tablet Take 81 mg by mouth daily.    Marland Kitchen atorvastatin (LIPITOR) 40 MG tablet Take 1 tablet (40 mg total) by mouth daily at 6 PM. 30 tablet 1  . Calcium Carbonate (CALCIUM 600 PO) Take 1 tablet by mouth daily. 1    . diclofenac Sodium (VOLTAREN) 1 % GEL APPLY 2 G TOPICALLY 4 (FOUR) TIMES DAILY. TO LEFT KNEE 300 g 2  . famotidine (PEPCID) 10 MG tablet Take 10 mg by mouth daily.    . Multiple Vitamin (MULTIVITAMIN) tablet Take 1 tablet by mouth daily.    . sertraline (ZOLOFT) 25 MG tablet Take 40 mg by mouth daily.     No current facility-administered medications on file prior to  visit.    Allergies:  No Known Allergies   Physical Exam  Vitals:   06/14/20 0744  BP: 140/64  Pulse: 86  Weight: 246 lb (111.6 kg)  Height: 5\' 1"  (1.549 m)   Body mass index is 46.48 kg/m. No exam data present  General: Obese pleasant middle-age Caucasian female, seated, in no evident distress Head: head normocephalic and atraumatic.   Neck: supple with no carotid or supraclavicular bruits Cardiovascular: regular rate and rhythm, no murmurs Musculoskeletal: no deformity Skin:  no rash/petichiae Vascular:  Normal pulses all extremities   Neurologic Exam Mental Status: Awake and fully alert.  Fluent speech and language.  Oriented to place and time. Recent and remote memory intact. Attention span, concentration and fund of knowledge appropriate. Mood and affect appropriate.  Cranial Nerves: Pupils equal, briskly reactive to light. Extraocular movements full without nystagmus. Visual fields full to confrontation.   Hearing intact. Facial sensation intact.   Motor: Normal bulk and tone.  Full strength in all tested extremities except slightly decreased left hand fine motor control Sensory.: intact to touch , pinprick , position and vibratory sensation.  Coordination: Rapid alternating movements normal in all extremities except decreased  mildly left hand dexterity. Finger-to-nose and heel-to-shin performed accurately bilaterally.  Orbits right arm on the left arm Gait and Station: Arises from chair without difficulty. Stance is normal. Gait demonstrates  wide-based gait with assistance of Rollator walker Reflexes: 1+ and symmetric. Toes downgoing.       ASSESSMENT: Deanna [name] is a 70 y.o. year old female presented with left-sided tingling and right gaze preference on 11/29/2018 with stroke work-up revealing large right MCA and 2 punctate left frontotemporal infarcts s/p TPA and IR with right MCA revascularization secondary to unknown etiology.  s/p loop recorder.  History of DVT treated with Eliquis.   Vascular risk factors include HTN, HLD, pre-DM and morbid obesity.     PLAN:  1. Cryptogenic stroke:  -Residual deficits: Mild LUE weakness and imbalance.  Ongoing improvement.  Discussed importance of continued use of AD with ambulation for fall prevention -continue aspirin 81 mg daily  and Lipitor for secondary stroke prevention.    -Loop recorder has not shown atrial fibrillation thus far -monthly reports personally reviewed -Discussed secondary stroke prevention's and importance of close follow-up with PCP for aggressive stroke risk factor management 2. Occlusion of right MCA: s/p thrombectomy.  Repeat CTA 12/17/2019 stable appearance.  Per Dr. Estanislado Pandy, plans on repeating CTA this month 3. HTN: BP goal<130/90.  Well-controlled on nonpharmacological management per PCP 4. HLD: LDL goal<70.  On atorvastatin 40 mg daily per PCP.  Possible statin side effect with dry flaky skin which started shortly after hospital discharge (only medication change).  Recommend holding atorvastatin for 1 week to see if any improvement and if so, would recommend switching to Crestor 20 mg daily.  If no improvement, would recommend further evaluation with dermatology to evaluate for other underlying issues as this was not present prior to her  stroke    Follow up in 6 months or call earlier if needed  CC:  [place] provider: Dr. Orson Eva, Anastasia Pall, MD    I spent 35 minutes of face-to-face and non-face-to-face time with patient and husband.  This included previsit chart review, lab review, study review, order entry, electronic health record documentation, patient education regarding prior stroke and etiology, intracranial stenosis, importance of managing stroke risk factors, dry skin concerns and possible medication side effect and answered all other questions to  patient and husband satisfaction  Frann Rider, AGNP-BC  [facility] [address] [place], [place] [address]  Phone [phone] Fax [phone] Note: This document was prepared with digital dictation and possible smart phrase technology. Any transcriptional errors that result from this process are unintentional.

## 2020-06-14 NOTE — Patient Instructions (Signed)
Questionable side effect of atorvastatin contributing to skin issues especially as this problem started shortly after starting atorvastatin.  Recommend stopping atorvastatin for at least 1 week to see if symptoms start to improve.  If they improve, recommend switching to Crestor 20 mg daily.  If no improvement, may benefit from getting a second opinion from a different dermatologist for further evaluation  Continue aspirin 81 mg daily for secondary stroke prevention  Continue to follow with Dr. Estanislado Pandy and ensure you schedule repeat imaging around April for surveillance monitoring  Continue to follow up with PCP regarding cholesterol and blood pressure management  Maintain strict control of hypertension with blood pressure goal below 130/90 and cholesterol with LDL cholesterol (bad cholesterol) goal below 70 mg/dL.       Followup in the future with me in 6 months or call earlier if needed       Thank you for coming to see Deanna [name] at [facility]. I hope we have been able to provide you high quality care today.  You may receive a patient satisfaction survey over the next few weeks. We would appreciate your feedback and comments so that we may continue to improve ourselves and the health of our patients.

## 2020-06-15 ENCOUNTER — Ambulatory Visit
Admission: RE | Admit: 2020-06-15 | Discharge: 2020-06-15 | Disposition: A | Discharge status: Home / Self Care | Payer: Medicare Other | Source: Ambulatory Visit | Attending: Family Medicine | Admitting: Family Medicine

## 2020-06-15 ENCOUNTER — Other Ambulatory Visit: Payer: Self-pay

## 2020-06-15 DIAGNOSIS — Z1231 Encounter for screening mammogram for malignant neoplasm of breast: Secondary | ICD-10-CM | POA: Diagnosis not present

## 2020-06-22 ENCOUNTER — Encounter: Payer: Self-pay | Admitting: Adult Health

## 2020-06-22 NOTE — Telephone Encounter (Signed)
Contacted patient in regards to holding atorvastatin due to skin concerns (please see prior OV note).  Atorvastatin has been held over the past week and she does believe there has been some improvement.  I advised her to continue to hold atorvastatin for 1 additional week and I will contact her in the beginning of next week to see if she has noticed continued improvement.  If so, we will further discuss other treatment options for cholesterol.

## 2020-06-28 ENCOUNTER — Other Ambulatory Visit (HOSPITAL_COMMUNITY): Payer: Self-pay

## 2020-06-28 ENCOUNTER — Other Ambulatory Visit (HOSPITAL_COMMUNITY): Payer: Self-pay | Admitting: Interventional Radiology

## 2020-06-28 DIAGNOSIS — I639 Cerebral infarction, unspecified: Secondary | ICD-10-CM

## 2020-06-29 ENCOUNTER — Telehealth: Payer: Self-pay | Admitting: Adult Health

## 2020-06-29 LAB — CUP PACEART REMOTE DEVICE CHECK
Date Time Interrogation Session: 20220214033204
Implantable Pulse Generator Implant Date: 20200721

## 2020-06-29 NOTE — Telephone Encounter (Signed)
Contacted patient in regards to use of atorvastatin. She initially felt as though skin condition was initially improving when she first stopped atorvastatin but at this point, she has not noticed any further improvement and has been stable.  Recommend restarting atorvastatin and to monitor for any worsening.  She will continue to follow with PCP and dermatology for monitoring and management.  She was appreciative of phone call without any further questions or concerns.

## 2020-07-04 ENCOUNTER — Ambulatory Visit (INDEPENDENT_AMBULATORY_CARE_PROVIDER_SITE_OTHER): Payer: Medicare Other

## 2020-07-04 DIAGNOSIS — I639 Cerebral infarction, unspecified: Secondary | ICD-10-CM

## 2020-07-07 ENCOUNTER — Other Ambulatory Visit: Payer: Self-pay

## 2020-07-07 ENCOUNTER — Ambulatory Visit (HOSPITAL_COMMUNITY)
Admission: RE | Admit: 2020-07-07 | Discharge: 2020-07-07 | Disposition: A | Discharge status: Home / Self Care | Payer: Medicare Other | Source: Ambulatory Visit | Attending: Interventional Radiology | Admitting: Interventional Radiology

## 2020-07-07 DIAGNOSIS — I639 Cerebral infarction, unspecified: Secondary | ICD-10-CM | POA: Diagnosis not present

## 2020-07-07 DIAGNOSIS — G9389 Other specified disorders of brain: Secondary | ICD-10-CM | POA: Diagnosis not present

## 2020-07-07 DIAGNOSIS — Z8673 Personal history of transient ischemic attack (TIA), and cerebral infarction without residual deficits: Secondary | ICD-10-CM | POA: Diagnosis not present

## 2020-07-07 DIAGNOSIS — I672 Cerebral atherosclerosis: Secondary | ICD-10-CM | POA: Diagnosis not present

## 2020-07-07 DIAGNOSIS — I6523 Occlusion and stenosis of bilateral carotid arteries: Secondary | ICD-10-CM | POA: Diagnosis not present

## 2020-07-07 LAB — POCT I-STAT CREATININE: Creatinine, Ser: 0.8 mg/dL (ref 0.44–1.00)

## 2020-07-07 MED ORDER — IOHEXOL 350 MG/ML SOLN
75.0000 mL | Freq: Once | INTRAVENOUS | Status: AC | PRN
  Administered 2020-07-07: 75 mL via INTRAVENOUS

## 2020-07-11 ENCOUNTER — Telehealth (HOSPITAL_COMMUNITY): Payer: Self-pay

## 2020-07-11 NOTE — Telephone Encounter (Signed)
Pt agreed to f/u in 6 months with cta head/neck. AW 

## 2020-07-12 NOTE — Progress Notes (Signed)
[facility] Summary Report / Loop Recorder 

## 2020-08-06 LAB — CUP PACEART REMOTE DEVICE CHECK
Date Time Interrogation Session: 20220319043001
Implantable Pulse Generator Implant Date: 20200721

## 2020-08-08 ENCOUNTER — Ambulatory Visit (INDEPENDENT_AMBULATORY_CARE_PROVIDER_SITE_OTHER): Payer: Medicare Other

## 2020-08-08 DIAGNOSIS — I639 Cerebral infarction, unspecified: Secondary | ICD-10-CM | POA: Diagnosis not present

## 2020-08-19 NOTE — Progress Notes (Signed)
[facility] Summary Report / Loop Recorder 

## 2020-09-12 ENCOUNTER — Ambulatory Visit (INDEPENDENT_AMBULATORY_CARE_PROVIDER_SITE_OTHER): Payer: Medicare Other

## 2020-09-12 DIAGNOSIS — I639 Cerebral infarction, unspecified: Secondary | ICD-10-CM | POA: Diagnosis not present

## 2020-09-13 LAB — CUP PACEART REMOTE DEVICE CHECK
Date Time Interrogation Session: 20220430231751
Implantable Pulse Generator Implant Date: 20200721

## 2020-09-14 DIAGNOSIS — Z23 Encounter for immunization: Secondary | ICD-10-CM | POA: Diagnosis not present

## 2020-09-30 NOTE — Progress Notes (Signed)
[facility] Summary Report / Loop Recorder 

## 2020-10-01 IMAGING — MR MRI HEAD WITHOUT CONTRAST
12 of 13 series · 44 of 48 positions shown · non-contrast
Comparison: Head CT, CTA, and CT perfusion 11/29/2018

CLINICAL DATA: Follow-up right MCA infarct. Right M2 occlusion
status post endovascular revascularization.

EXAM:
MRI HEAD WITHOUT CONTRAST
TECHNIQUE: Multiplanar, multiecho pulse sequences of the brain and surrounding
structures were obtained without intravenous contrast.

[Series 5: DWI · axial · 3.0mm · 0.92mm/px · z∈[-96,+56]mm · 7 of 104 slices shown (1 of 4)]
[im 1/104]
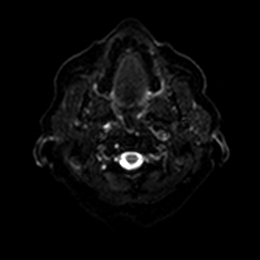
[im 18/104]
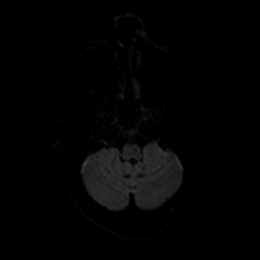
[im 35/104]
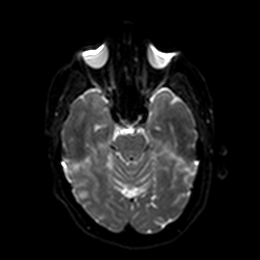
[im 52/104]
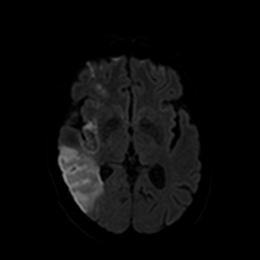
[im 69/104]
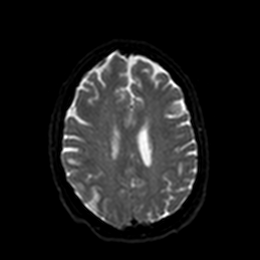
[im 86/104]
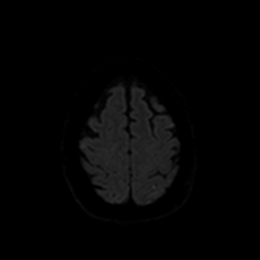
[im 104/104]
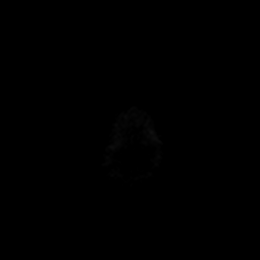

[Series 6: DWI · axial · 3.0mm · 0.92mm/px · z∈[-96,+56]mm · 4 of 52 slices shown (2 of 4)]
[im 1/52]
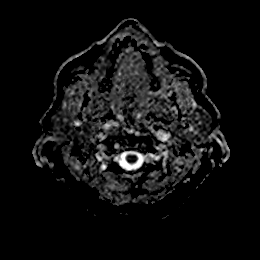
[im 18/52]
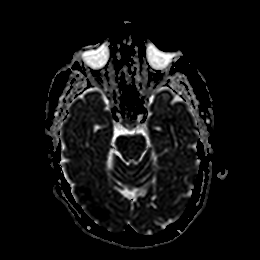
[im 35/52]
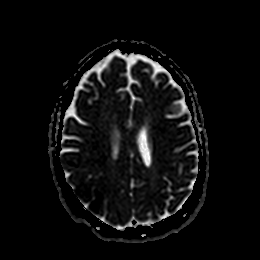
[im 52/52]
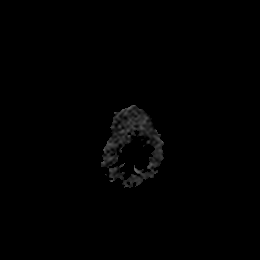

[Series 7: DWI · coronal · 4.0mm · 0.88mm/px · 6 of 78 slices shown (3 of 4)]
[im 1/78]
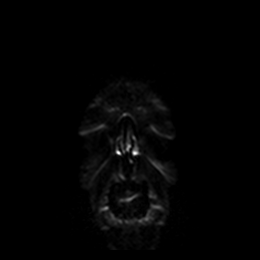
[im 16/78]
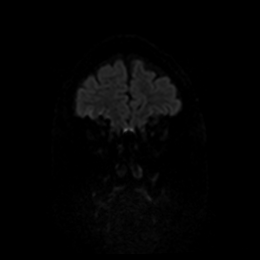
[im 31/78]
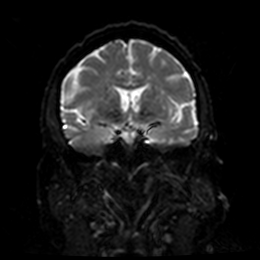
[im 47/78]
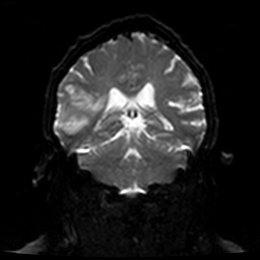
[im 62/78]
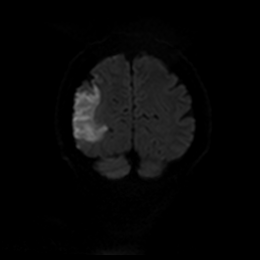
[im 78/78]
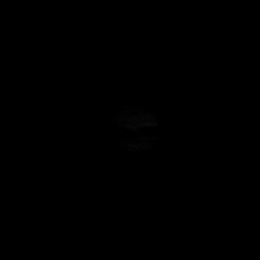

[Series 8: DWI · coronal · 4.0mm · 0.88mm/px · 3 of 39 slices shown (4 of 4)]
[im 1/39]
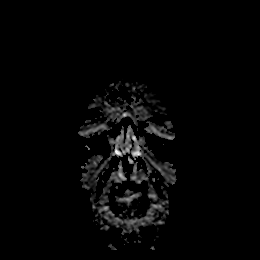
[im 20/39]
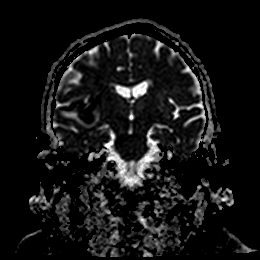
[im 39/39]
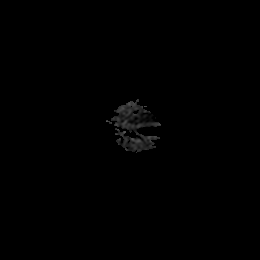

[Series 9: FLAIR · axial · 5.0mm · 0.47mm/px · z∈[-97,+58]mm · 2 of 27 slices shown]
[im 1/27]
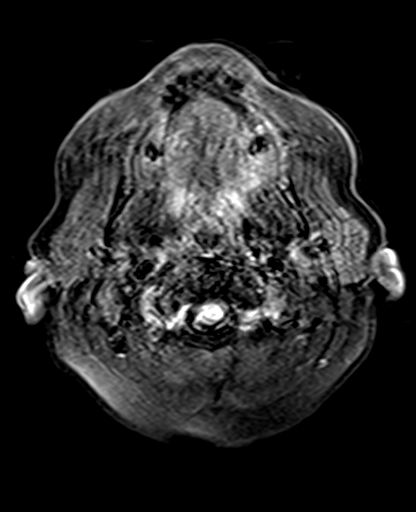
[im 27/27]
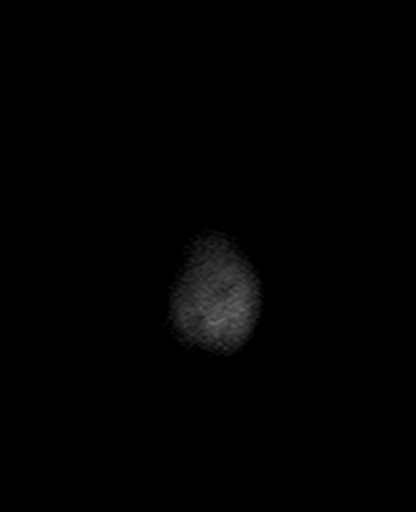

[Series 10: mag_images · axial · 3.0mm · 0.94mm/px · z∈[-101,+63]mm · 4 of 56 slices shown]
[im 1/56]
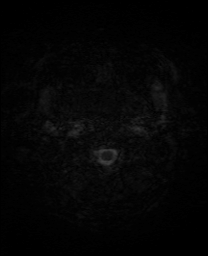
[im 19/56]
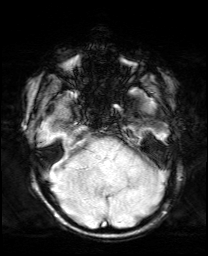
[im 37/56]
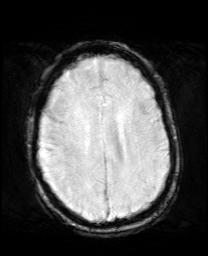
[im 56/56]
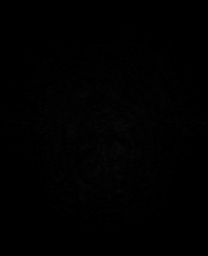

[Series 11: pha_images · axial · 3.0mm · 0.94mm/px · z∈[-98,+60]mm · 4 of 51 slices shown]
[im 1/51]
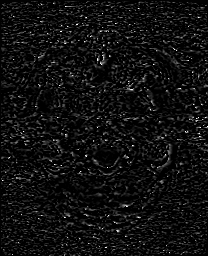
[im 17/51]
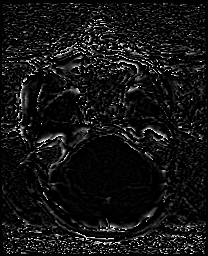
[im 34/51]
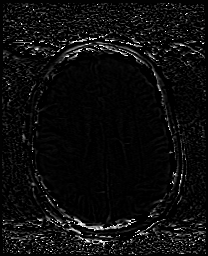
[im 51/51]
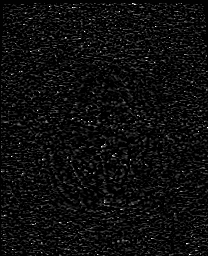

[Series 12: swi_images · axial · 3.0mm · 0.94mm/px · z∈[-101,+63]mm · 4 of 56 slices shown]
[im 1/56]
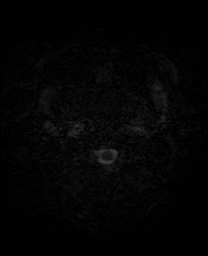
[im 19/56]
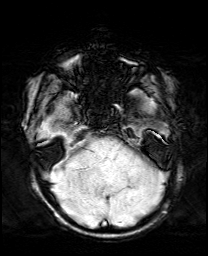
[im 37/56]
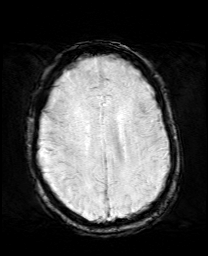
[im 56/56]
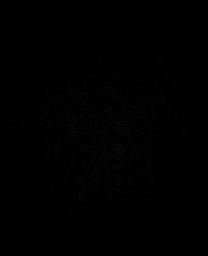

[Series 13: mip_images(sw) · axial · 24.0mm · 0.94mm/px · z∈[-91,+52]mm · 4 of 49 slices shown]
[im 1/49]
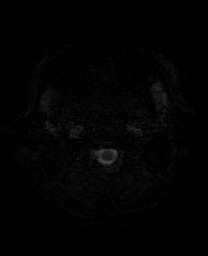
[im 17/49]
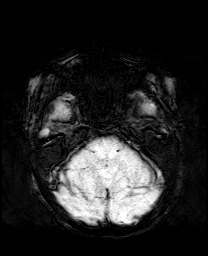
[im 33/49]
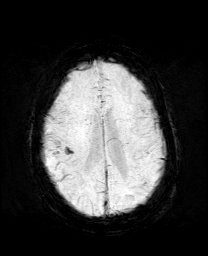
[im 49/49]
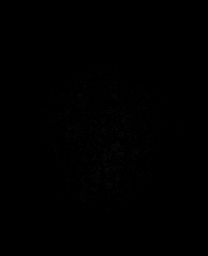

[Series 14: T1 · sagittal · 5.0mm · 0.75mm/px · 2 of 25 slices shown]
[im 1/25]
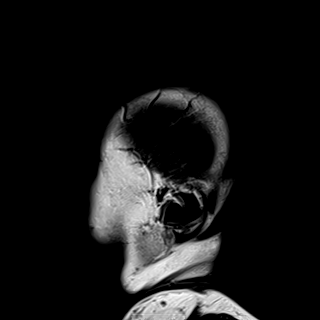
[im 25/25]
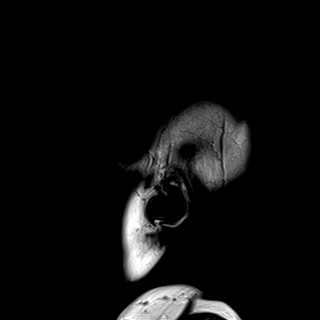

[Series 15: T2 · axial · 5.0mm · 0.75mm/px · z∈[-98,+57]mm · 2 of 27 slices shown (1 of 2)]
[im 1/27]
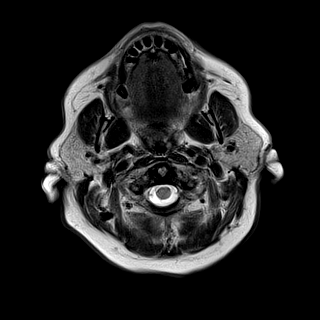
[im 27/27]
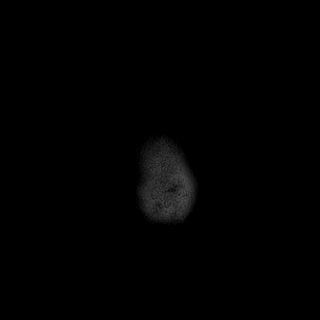

[Series 17: T2 · coronal · 5.0mm · 0.72mm/px · 2 of 30 slices shown (2 of 2)]
[im 1/30]
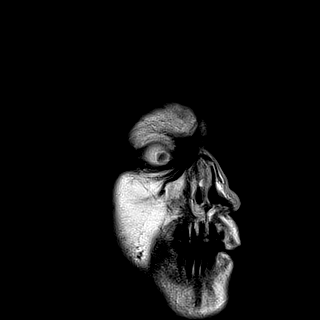
[im 30/30]
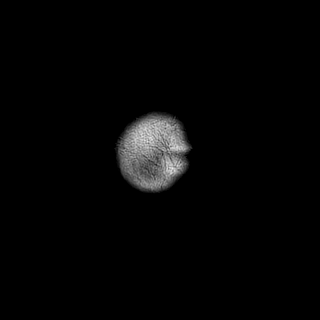

[44 of 48 positions shown; findings below may reference images not displayed]

FINDINGS: The study is mildly motion degraded.

Brain: There is a moderate-sized acute right MCA infarct involving
the parietal lobe, posterior temporal lobe, lateral occipital lobe,
and posterior insula with associated confluent petechial hemorrhage
at the level of the insula and posterior operculum. There is
associated cytotoxic edema without midline shift or other
significant mass effect.

Additional small acute infarcts are present in the anteroinferior
right frontal lobe and posterior right frontal lobe. There are also
punctate acute infarcts in the high posterior left frontal lobe and
possibly left parietal lobe. There is no extra-axial fluid
collection. Scattered small foci of T2 hyperintensity in the
cerebral white matter bilaterally are nonspecific but compatible
with mild chronic small vessel ischemic disease.

Vascular: Major intracranial vascular flow voids are preserved.

Skull and upper cervical spine: Unremarkable bone marrow signal.

Sinuses/Orbits: Unremarkable orbits. Paranasal sinuses and mastoid
air cells are clear.

Other: None.
IMPRESSION: 1. Moderate-sized acute right MCA infarct with confluent petechial
hemorrhage.
2. One or two punctate acute left frontoparietal infarcts.
3. Mild chronic small vessel ischemic disease.

## 2020-10-16 LAB — CUP PACEART REMOTE DEVICE CHECK
Date Time Interrogation Session: 20220602232205
Implantable Pulse Generator Implant Date: 20200721

## 2020-10-17 ENCOUNTER — Ambulatory Visit (INDEPENDENT_AMBULATORY_CARE_PROVIDER_SITE_OTHER): Payer: Medicare Other

## 2020-10-17 DIAGNOSIS — I639 Cerebral infarction, unspecified: Secondary | ICD-10-CM | POA: Diagnosis not present

## 2020-11-08 NOTE — Progress Notes (Signed)
[facility] Summary Report / Loop Recorder 

## 2020-11-16 DIAGNOSIS — F419 Anxiety disorder, unspecified: Secondary | ICD-10-CM | POA: Diagnosis not present

## 2020-11-16 DIAGNOSIS — R030 Elevated blood-pressure reading, without diagnosis of hypertension: Secondary | ICD-10-CM | POA: Diagnosis not present

## 2020-11-16 DIAGNOSIS — Z23 Encounter for immunization: Secondary | ICD-10-CM | POA: Diagnosis not present

## 2020-11-16 DIAGNOSIS — K219 Gastro-esophageal reflux disease without esophagitis: Secondary | ICD-10-CM | POA: Diagnosis not present

## 2020-11-16 DIAGNOSIS — Z0000 Encounter for general adult medical examination without abnormal findings: Secondary | ICD-10-CM | POA: Diagnosis not present

## 2020-11-16 DIAGNOSIS — R7303 Prediabetes: Secondary | ICD-10-CM | POA: Diagnosis not present

## 2020-11-16 DIAGNOSIS — B079 Viral wart, unspecified: Secondary | ICD-10-CM | POA: Diagnosis not present

## 2020-11-16 DIAGNOSIS — I639 Cerebral infarction, unspecified: Secondary | ICD-10-CM | POA: Diagnosis not present

## 2020-11-20 LAB — CUP PACEART REMOTE DEVICE CHECK
Date Time Interrogation Session: 20220705233501
Implantable Pulse Generator Implant Date: 20200721

## 2020-11-21 ENCOUNTER — Ambulatory Visit (INDEPENDENT_AMBULATORY_CARE_PROVIDER_SITE_OTHER): Payer: Medicare Other

## 2020-11-21 DIAGNOSIS — I639 Cerebral infarction, unspecified: Secondary | ICD-10-CM

## 2020-12-14 ENCOUNTER — Encounter: Payer: Self-pay | Admitting: Adult Health

## 2020-12-14 ENCOUNTER — Ambulatory Visit (INDEPENDENT_AMBULATORY_CARE_PROVIDER_SITE_OTHER): Payer: Medicare Other | Admitting: Adult Health

## 2020-12-14 VITALS — BP 146/78 | HR 92 | Ht 61.0 in | Wt 237.0 lb

## 2020-12-14 DIAGNOSIS — R269 Unspecified abnormalities of gait and mobility: Secondary | ICD-10-CM | POA: Diagnosis not present

## 2020-12-14 DIAGNOSIS — I10 Essential (primary) hypertension: Secondary | ICD-10-CM | POA: Diagnosis not present

## 2020-12-14 DIAGNOSIS — I69398 Other sequelae of cerebral infarction: Secondary | ICD-10-CM

## 2020-12-14 DIAGNOSIS — E785 Hyperlipidemia, unspecified: Secondary | ICD-10-CM | POA: Diagnosis not present

## 2020-12-14 DIAGNOSIS — I639 Cerebral infarction, unspecified: Secondary | ICD-10-CM

## 2020-12-14 NOTE — Progress Notes (Signed)
[facility] [address] [place] [address] [phone]       OFFICE FOLLOW UP NOTE  [name] Date of Birth:  Sep 29, 1950 Medical Record Number:  [id number]   Reason for Referral: stroke follow up    CHIEF COMPLAINT:  Chief Complaint  Patient presents with   Follow-up    Rm 3 with spouse Deanna [name]  Pt is well, things are going really good, feels more like herself now.  No new complications      HPI:  Today, 12/14/2020, Deanna [name] returns for 71-monthstroke follow-up accompanied by her husband, PAbbe [name]  Overall doing well.  Residual mild imbalance with use of RW outdoors but no AD usually in home - denies any recent falls. Denies residual left hand weakness.  Denies new stroke/TIA symptoms.  Compliant on aspirin and atorvastatin without associated side effects.  Blood pressure today 146/78 - occasionally monitors BP at home.  Loop recorder has not shown atrial fibrillation thus far.  Did have recent physical beginning of July with Pcp - lab work stable per patient (unable to view via epic).  No further concerns at this time.     History provided for reference purposes only Update 06/14/2020 JM: Ms. KHuverreturns for 716-monthtroke follow-up accompanied by her husband.  Doing well since prior visit without new stroke/TIA symptoms and reports residual mild imbalance and very slight decrease left hand dexterity.  Continues to use RW for long distance and cane for short distance around her home.  Denies any recent falls.  Remains on aspirin 81 mg daily and atorvastatin 40 mg daily for secondary stroke prevention.  She reports recent lab work completed by PCP which was satisfactory per patient.  Blood pressure today 140/64 -monitors at home and typically 120s/60s.  Loop recorder has not shown atrial fibrillation thus far.  She does report since her stroke, she has been having difficulty with her skin especially on her hands, forehead and head where at times can be  itchy but is constantly flaking despite use of creams and special shampoos.  No concerns at this time.  Update 11/26/2019 70M: Ms. KiSpotteturns for stroke follow-up accompanied by her husband.  She has been stable since prior visit with residual mild imbalance and mild decreased left hand dexterity but overall great improvement. Will use cane inside of home and RW outdoors -denies any recent falls.  Denies new stroke/TIA symptoms.  Remains on aspirin 81 mg daily and atorvastatin for secondary stroke prevention without side effects.  Blood pressure today elevated but monitors at home and typically stable.  Loop recorder has not shown atrial fibrillation thus far.  Repeat CTA showed severe right MCA M2 stenosis with evaluation by Dr. DeEstanislado Pandyho recommended image guided diagnostic cerebral arteriogram 3 months from CTA (around 07/2019) but apparently they have had difficulty contacting office.  She is questioning need of procedure as she has been feeling good and is asymptomatic.  No concerns at this time.  Update 05/20/2019: Ms. KiBeckeys a [age]ear old old female who is being seen today for stroke follow-up.  Residual deficits of mild LUE weakness and mild imbalance but overall greatly improving.  She does continue to ambulate with Rollator walker outdoors and will use four-point cane in her home.  Denies any recent falls she has recently completed PT/OT but [facility]  Repeat lower extremity ultrasound showed resolution of acute DVT therefore Eliquis discontinued and was initiated on aspirin 81  mg daily.  She has continued on aspirin without bleeding or bruising.  Continues on atorvastatin without myalgias.  She plans on following up with her PCP in the near future for repeat lab work.  Blood pressure today 139/67.  Loop recorder is not shown atrial fibrillation thus far.  Denies new or worsening stroke/TIA symptoms.  Initial visit 02/04/2019: Deanna [name] is  being seen today, 02/04/2019, for hospital follow-up accompanied by her daughter.  Residual deficits left hemiparesis with improvement.  Denies residual vision or speech difficulties.  She will be completing OT shortly and continues to participate in home health PT.  Currently ambulates with rolling walker but in the process of transitioning to cane inside the house with assistance of PT.  Currently living with her husband but does have assistance of her daughters when needed.  She is becoming more independent with ADLs.  Currently on aspirin 81 mg daily and Eliquis 5 mg twice daily without bleeding or bruising.  Continues on atorvastatin 40 mg daily without myalgias.  Blood pressure today 146/82 but does monitor at home and typically lower.  Recently started on sertraline by PCP for increased anxiety.  Loop recorder is not shown atrial fibrillation thus far.  Denies new or worsening stroke/TIA symptoms.  Stroke admission 11/29/2018: Deanna [name] is a 70 y.o. female with a history of pre diabetes and asthma but on no medications, presented to [facility] ED on 11/29/2018 with left-sided tingling and right gaze preference.  Stroke work-up revealed large right MCA M2 punctate left frontal temporal infarcts as evidenced on MRI status post TPA and IR with right MCA TICI 2B revascularization embolic pattern secondary to unknown etiology.  Post TPA imaging showed small hemorrhage stable.  TEE unremarkable therefore loop recorder placed for long-term monitoring of atrial fibrillation and stroke etiology.  Initiated aspirin 81 mg daily.  No DAPT due to post TPA hemorrhage.  HTN stable long-term BP goal normotensive range.  LDL 103 initiated atorvastatin 40 mg daily.  Pre-DM with A1c 5.9.  Other stroke risk factors include advanced age and morbid obesity but no prior history of stroke.  Residual deficits from right gaze preference, left homonymous hemianopia, mild dysarthria, mild left facial weakness and left hemiparesis.   Discharged to CIR for ongoing therapies.  CT head - Acute posterior right MCA infarct with hyperdense right M2 branches. CTA H&N - Proximal right M2 occlusion.  CT Penumbra - Associated acute right MCA infarct with penumbra.  Cerebral angio TICI2b+ revascularization of occluded Rt MCA inf division with x 1 pass embotrap and penumbra. Post IR CT no gross hmg, contrast blush present R post frontal parietal region MRI head -mod R MCA infarct w/ confluent petechial hmg. 2 small L punctate  Frontotemporal infarcts  Repeat stat CT 7/20 stable, no significant change 2D Echo EF > 65%. No source of embolus  TEE no PFO, no SOE Loop placed 12/02/2018 [name] Virus 2 - negative LDL - 103 HgbA1c - 5.9 No antithrombotic prior to admission, now on aspirin 81 mg daily post tPA Therapy recommendations:  CIR Disposition:  CIR  During CIR admission, repeat LE venous Doppler showed progression of acute DVT from left PTV to left popliteal vein therefore recommended anticoagulation with Eliquis 5 mg twice daily as repeat CT head stable without new bleeding or hematoma.  She was discharged home with recommendation of home health therapy on 12/26/2018.   ROS:   14 system review of systems performed and negative with exception of those listed in  HPI  PMH:  Past Medical History:  Diagnosis Date   Arthritis    osteoarthritis in  both knees   Asthma    Difficult intravenous access    GERD (gastroesophageal reflux disease)    Heart murmur    born with years ago   Pre-diabetes    last A1C=6.0 per pt   Torn meniscus    left knee w fracture x2 to left knee   Wears glasses     PSH:  Past Surgical History:  Procedure Laterality Date   BUBBLE STUDY  12/02/2018   Procedure: BUBBLE STUDY;  Surgeon: Elouise Munroe, MD;  Location: [facility];  Service: Cardiology;;   CHOLECYSTECTOMY     CHONDROPLASTY Right 07/09/2017   Procedure: CHONDROPLASTY;  Surgeon: Sydnee Cabal, MD;  Location: [facility];  Service: Orthopedics;  Laterality: Right;   HERNIA REPAIR Right age 41   inguinal   IR CT HEAD LTD  11/29/2018   IR PERCUTANEOUS ART THROMBECTOMY/INFUSION INTRACRANIAL INC DIAG ANGIO  11/29/2018   IR RADIOLOGIST EVAL & MGMT  06/18/2019   KNEE ARTHROSCOPY WITH MEDIAL MENISECTOMY Left 01/10/2017   Procedure: LEFT KNEE ARTHROSCOPY, DEBRIDEMENT, PARTIAL MEDIAL MENISCECTOMY, CHONDROPLASTY, ARTHROSCOPIC ASSISTED INTERNAL FIXATION OF MEDIAL FEMORAL CONDYLE AND MEDIAL TIBIAL PLATEAU INSUFFICIENCY FRACTURES;  Surgeon: Sydnee Cabal, MD;  Location: [facility];  Service: Orthopedics;  Laterality: Left;   KNEE ARTHROSCOPY WITH MEDIAL MENISECTOMY Right 07/09/2017   Procedure: Right knee scope, debridement, chondroplasty, partial meniscectomy, internal arthroscopic assisted internal fixation of medial tibial plateau fracture;  Surgeon: Sydnee Cabal, MD;  Location: [facility];  Service: Orthopedics;  Laterality: Right;   LOOP RECORDER INSERTION N/A 12/02/2018   Procedure: LOOP RECORDER INSERTION;  Surgeon: Evans Lance, MD;  Location: [facility];  Service: Cardiovascular;  Laterality: N/A;   RADIOLOGY WITH ANESTHESIA N/A 11/29/2018   Procedure: RADIOLOGY WITH ANESTHESIA;  Surgeon: Luanne Bras, MD;  Location: [facility];  Service: Radiology;  Laterality: N/A;   TEE WITHOUT CARDIOVERSION N/A 12/02/2018   Procedure: TRANSESOPHAGEAL ECHOCARDIOGRAM (TEE);  Surgeon: Elouise Munroe, MD;  Location: [facility];  Service: Cardiology;  Laterality: N/A;   TUBAL LIGATION  age 66    Social History:  Social History   Socioeconomic History   Marital status: Married    Spouse name: Not on file   Number of children: Not on file   Years of education: Not on file   Highest education level: Not on file  Occupational History   Not on file  Tobacco Use   Smoking status: Never   Smokeless tobacco: Never  Vaping Use   Vaping Use: Never used  Substance and  Sexual Activity   Alcohol use: No   Drug use: No   Sexual activity: Not on file  Other Topics Concern   Not on file  Social History Narrative   Not on file   Social Determinants of Health   Financial Resource Strain: Not on file  Food Insecurity: Not on file  Transportation Needs: Not on file  Physical Activity: Not on file  Stress: Not on file  Social Connections: Not on file  Intimate Partner Violence: Not on file    Family History:  Family History  Problem Relation Age of Onset   Stroke Father    Stroke Brother     Medications:   Current Outpatient Medications on File Prior to Visit  Medication Sig Dispense Refill   aspirin EC 81 MG tablet Take 81  mg by mouth daily.     atorvastatin (LIPITOR) 40 MG tablet Take 1 tablet (40 mg total) by mouth daily at 6 PM. 30 tablet 1   Calcium Carbonate (CALCIUM 600 PO) Take 1 tablet by mouth daily. 1     diclofenac Sodium (VOLTAREN) 1 % GEL APPLY 2 G TOPICALLY 4 (FOUR) TIMES DAILY. TO LEFT KNEE 300 g 2   famotidine (PEPCID) 10 MG tablet Take 10 mg by mouth daily.     Multiple Vitamin (MULTIVITAMIN) tablet Take 1 tablet by mouth daily.     sertraline (ZOLOFT) 50 MG tablet Take 40 mg by mouth daily.     No current facility-administered medications on file prior to visit.    Allergies:  No Known Allergies   Physical Exam  Vitals:   12/14/20 0829  BP: (!) 146/78  Pulse: 92  Weight: 237 lb (107.5 kg)  Height: '5\' 1"'$  (1.549 m)    Body mass index is 44.78 kg/m. No results found.  General: Obese pleasant middle-age Caucasian female, seated, in no evident distress Head: head normocephalic and atraumatic.   Neck: supple with no carotid or supraclavicular bruits Cardiovascular: regular rate and rhythm, no murmurs Musculoskeletal: no deformity Skin:  no rash/petichiae Vascular:  Normal pulses all extremities   Neurologic Exam Mental Status: Awake and fully alert.  Fluent speech and language.  Oriented to place and time.  Recent and remote memory intact. Attention span, concentration and fund of knowledge appropriate. Mood and affect appropriate.  Cranial Nerves: Pupils equal, briskly reactive to light. Extraocular movements full without nystagmus. Visual fields full to confrontation.   Hearing intact. Facial sensation intact.   Motor: Normal bulk and tone.  Full strength in all tested extremities except slightly decreased left hand fine motor control Sensory.: intact to touch , pinprick , position and vibratory sensation.  Coordination: Rapid alternating movements normal in all extremities except decreased mildly left hand dexterity. Finger-to-nose and heel-to-shin performed accurately bilaterally.  Orbits right arm on the left arm Gait and Station: Arises from chair without difficulty. Stance is normal. Gait demonstrates  wide-based gait with assistance of Rollator walker and mild stiffening of left leg Reflexes: 1+ and symmetric. Toes downgoing.       ASSESSMENT: Deanna [name] is a 70 y.o. year old female presented with left-sided tingling and right gaze preference on 11/29/2018 with stroke work-up revealing large right MCA and 2 punctate left frontotemporal infarcts s/p TPA and IR with right MCA revascularization secondary to unknown etiology.  s/p loop recorder.  History of DVT treated with Eliquis.   Vascular risk factors include HTN, HLD, pre-DM and morbid obesity.     PLAN:  Cryptogenic stroke:  -Residual deficits: Mild LUE weakness and imbalance - stable -continue aspirin 81 mg daily  and Lipitor for secondary stroke prevention.    -Loop recorder has not shown atrial fibrillation thus far -monthly reports personally reviewed -Discussed secondary stroke prevention's and importance of close follow-up with PCP for aggressive stroke risk factor management - reports recent lab work by PCP -satisfactory per pt (unable to view via epic) Occlusion of right MCA: s/p thrombectomy. CTA 07/07/2020 stable.  Followed by  Dr. Estanislado Pandy - recommended repeat CTA head/neck this month HTN: BP goal<130/90.  Well-controlled on nonpharmacological management per PCP HLD: LDL goal<70.  On atorvastatin 40 mg daily per PCP.     Advised pt we will see her back one more time in 6 months and then can consolidate care with PCP if continues to be  stable.   CC:  [facility] provider: Dr. Orson Eva, Anastasia Pall, MD    I spent 34 minutes of face-to-face and non-face-to-face time with patient and husband.  This included previsit chart review, lab review, study review, electronic health record documentation, patient education regarding prior stroke and etiology, intracranial stenosis, importance of managing stroke risk factors, and answered all other questions to patient and husband satisfaction  Frann Rider, AGNP-BC  [facility] [address] [place], [place] [address]  Phone [phone] Fax [phone] Note: This document was prepared with digital dictation and possible smart phrase technology. Any transcriptional errors that result from this process are unintentional.

## 2020-12-14 NOTE — Patient Instructions (Signed)
Continue aspirin 81 mg daily  and atorvastatin 40 mg daily for secondary stroke prevention  Loop recorder has not shown atrial fibrillation thus far - will continue to be monitored by cardiology  Continue to follow up with PCP regarding cholesterol and blood pressure management  Maintain strict control of hypertension with blood pressure goal below 130/90 and cholesterol with LDL cholesterol (bad cholesterol) goal below 70 mg/dL.       Followup in the future with me in 6 months or call earlier if needed       Thank you for coming to see [place] at [facility]. I hope we have been able to provide you high quality care today.  You may receive a patient satisfaction survey over the next few weeks. We would appreciate your feedback and comments so that we may continue to improve ourselves and the health of our patients.

## 2020-12-15 NOTE — Progress Notes (Signed)
[facility] Summary Report / Loop Recorder 

## 2020-12-15 NOTE — Progress Notes (Signed)
I agree with the above plan 

## 2020-12-26 ENCOUNTER — Ambulatory Visit (INDEPENDENT_AMBULATORY_CARE_PROVIDER_SITE_OTHER): Payer: Medicare Other

## 2020-12-26 DIAGNOSIS — I639 Cerebral infarction, unspecified: Secondary | ICD-10-CM | POA: Diagnosis not present

## 2020-12-27 LAB — CUP PACEART REMOTE DEVICE CHECK
Date Time Interrogation Session: 20220808005544
Implantable Pulse Generator Implant Date: 20200721

## 2021-01-13 NOTE — Progress Notes (Signed)
[facility] Summary Report / Loop Recorder 

## 2021-01-19 ENCOUNTER — Telehealth (HOSPITAL_COMMUNITY): Payer: Self-pay

## 2021-01-19 ENCOUNTER — Other Ambulatory Visit (HOSPITAL_COMMUNITY): Payer: Self-pay | Admitting: Interventional Radiology

## 2021-01-19 DIAGNOSIS — I639 Cerebral infarction, unspecified: Secondary | ICD-10-CM

## 2021-01-20 DIAGNOSIS — Z23 Encounter for immunization: Secondary | ICD-10-CM | POA: Diagnosis not present

## 2021-01-25 LAB — CUP PACEART REMOTE DEVICE CHECK
Date Time Interrogation Session: 20220910005425
Implantable Pulse Generator Implant Date: 20200721

## 2021-01-30 ENCOUNTER — Ambulatory Visit (INDEPENDENT_AMBULATORY_CARE_PROVIDER_SITE_OTHER): Payer: Medicare Other

## 2021-01-30 DIAGNOSIS — I639 Cerebral infarction, unspecified: Secondary | ICD-10-CM | POA: Diagnosis not present

## 2021-02-01 ENCOUNTER — Encounter (HOSPITAL_COMMUNITY): Payer: Self-pay

## 2021-02-01 ENCOUNTER — Other Ambulatory Visit: Payer: Self-pay

## 2021-02-01 ENCOUNTER — Ambulatory Visit (HOSPITAL_COMMUNITY)
Admission: RE | Admit: 2021-02-01 | Discharge: 2021-02-01 | Disposition: A | Discharge status: Home / Self Care | Payer: Medicare Other | Source: Ambulatory Visit | Attending: Interventional Radiology | Admitting: Interventional Radiology

## 2021-02-01 DIAGNOSIS — I63411 Cerebral infarction due to embolism of right middle cerebral artery: Secondary | ICD-10-CM | POA: Diagnosis not present

## 2021-02-01 DIAGNOSIS — I63233 Cerebral infarction due to unspecified occlusion or stenosis of bilateral carotid arteries: Secondary | ICD-10-CM | POA: Diagnosis not present

## 2021-02-01 DIAGNOSIS — I639 Cerebral infarction, unspecified: Secondary | ICD-10-CM | POA: Diagnosis not present

## 2021-02-01 DIAGNOSIS — M47812 Spondylosis without myelopathy or radiculopathy, cervical region: Secondary | ICD-10-CM | POA: Diagnosis not present

## 2021-02-01 LAB — POCT I-STAT CREATININE: Creatinine, Ser: 0.8 mg/dL (ref 0.44–1.00)

## 2021-02-01 MED ORDER — IOHEXOL 350 MG/ML SOLN
100.0000 mL | Freq: Once | INTRAVENOUS | Status: AC | PRN
  Administered 2021-02-01: 100 mL via INTRAVENOUS

## 2021-02-06 NOTE — Progress Notes (Signed)
[facility] Summary Report / Loop Recorder 

## 2021-02-07 DIAGNOSIS — Z23 Encounter for immunization: Secondary | ICD-10-CM | POA: Diagnosis not present

## 2021-02-07 NOTE — Telephone Encounter (Signed)
Called pt regarding recent imaging, no answer, no vm. AW  

## 2021-02-20 ENCOUNTER — Telehealth (HOSPITAL_COMMUNITY): Payer: Self-pay

## 2021-02-20 NOTE — Telephone Encounter (Signed)
Spoke to pt's husband, agreed to f/u in 6 months with cta head/neck. AW

## 2021-02-27 ENCOUNTER — Ambulatory Visit (INDEPENDENT_AMBULATORY_CARE_PROVIDER_SITE_OTHER): Payer: Medicare Other

## 2021-02-27 DIAGNOSIS — I639 Cerebral infarction, unspecified: Secondary | ICD-10-CM | POA: Diagnosis not present

## 2021-02-28 LAB — CUP PACEART REMOTE DEVICE CHECK
Date Time Interrogation Session: 20221013005444
Implantable Pulse Generator Implant Date: 20200721

## 2021-03-08 NOTE — Progress Notes (Signed)
[facility] Summary Report / Loop Recorder 

## 2021-03-19 DIAGNOSIS — Z20828 Contact with and (suspected) exposure to other viral communicable diseases: Secondary | ICD-10-CM | POA: Diagnosis not present

## 2021-03-29 LAB — CUP PACEART REMOTE DEVICE CHECK
Date Time Interrogation Session: 20221114235517
Implantable Pulse Generator Implant Date: 20200721

## 2021-04-03 ENCOUNTER — Ambulatory Visit (INDEPENDENT_AMBULATORY_CARE_PROVIDER_SITE_OTHER): Payer: Medicare Other

## 2021-04-03 DIAGNOSIS — I639 Cerebral infarction, unspecified: Secondary | ICD-10-CM | POA: Diagnosis not present

## 2021-04-05 DIAGNOSIS — B078 Other viral warts: Secondary | ICD-10-CM | POA: Diagnosis not present

## 2021-04-05 DIAGNOSIS — L821 Other seborrheic keratosis: Secondary | ICD-10-CM | POA: Diagnosis not present

## 2021-04-05 DIAGNOSIS — L218 Other seborrheic dermatitis: Secondary | ICD-10-CM | POA: Diagnosis not present

## 2021-04-05 DIAGNOSIS — Z1283 Encounter for screening for malignant neoplasm of skin: Secondary | ICD-10-CM | POA: Diagnosis not present

## 2021-04-12 NOTE — Progress Notes (Signed)
[facility] Summary Report / Loop Recorder 

## 2021-05-02 ENCOUNTER — Other Ambulatory Visit: Payer: Self-pay | Admitting: Family Medicine

## 2021-05-02 DIAGNOSIS — Z1231 Encounter for screening mammogram for malignant neoplasm of breast: Secondary | ICD-10-CM

## 2021-05-04 LAB — CUP PACEART REMOTE DEVICE CHECK
Date Time Interrogation Session: 20221217235918
Implantable Pulse Generator Implant Date: 20200721

## 2021-05-09 ENCOUNTER — Ambulatory Visit (INDEPENDENT_AMBULATORY_CARE_PROVIDER_SITE_OTHER): Payer: Medicare Other

## 2021-05-09 DIAGNOSIS — I639 Cerebral infarction, unspecified: Secondary | ICD-10-CM | POA: Diagnosis not present

## 2021-05-18 NOTE — Progress Notes (Signed)
[facility] Summary Report / Loop Recorder 

## 2021-05-19 DIAGNOSIS — I69954 Hemiplegia and hemiparesis following unspecified cerebrovascular disease affecting left non-dominant side: Secondary | ICD-10-CM | POA: Diagnosis not present

## 2021-05-19 DIAGNOSIS — R7303 Prediabetes: Secondary | ICD-10-CM | POA: Diagnosis not present

## 2021-05-19 DIAGNOSIS — F419 Anxiety disorder, unspecified: Secondary | ICD-10-CM | POA: Diagnosis not present

## 2021-05-23 DIAGNOSIS — K219 Gastro-esophageal reflux disease without esophagitis: Secondary | ICD-10-CM | POA: Diagnosis not present

## 2021-05-23 DIAGNOSIS — Z86010 Personal history of colonic polyps: Secondary | ICD-10-CM | POA: Diagnosis not present

## 2021-06-12 ENCOUNTER — Ambulatory Visit (INDEPENDENT_AMBULATORY_CARE_PROVIDER_SITE_OTHER): Payer: Medicare Other

## 2021-06-12 DIAGNOSIS — I639 Cerebral infarction, unspecified: Secondary | ICD-10-CM

## 2021-06-12 LAB — CUP PACEART REMOTE DEVICE CHECK
Date Time Interrogation Session: 20230129232403
Implantable Pulse Generator Implant Date: 20200721

## 2021-06-16 ENCOUNTER — Ambulatory Visit: Payer: Medicare Other

## 2021-06-16 DIAGNOSIS — K5730 Diverticulosis of large intestine without perforation or abscess without bleeding: Secondary | ICD-10-CM | POA: Diagnosis not present

## 2021-06-16 DIAGNOSIS — K648 Other hemorrhoids: Secondary | ICD-10-CM | POA: Diagnosis not present

## 2021-06-16 DIAGNOSIS — Z86010 Personal history of colonic polyps: Secondary | ICD-10-CM | POA: Diagnosis not present

## 2021-06-16 DIAGNOSIS — Z800 Family history of malignant neoplasm of digestive organs: Secondary | ICD-10-CM | POA: Diagnosis not present

## 2021-06-19 ENCOUNTER — Ambulatory Visit
Admission: RE | Admit: 2021-06-19 | Discharge: 2021-06-19 | Disposition: A | Discharge status: Home / Self Care | Payer: Medicare Other | Source: Ambulatory Visit | Attending: Family Medicine | Admitting: Family Medicine

## 2021-06-19 DIAGNOSIS — Z1231 Encounter for screening mammogram for malignant neoplasm of breast: Secondary | ICD-10-CM

## 2021-06-20 NOTE — Progress Notes (Signed)
[facility] [address] [place]. [place] [address] [phone]       OFFICE FOLLOW UP NOTE  [name] Date of Birth:  1950/06/04 Medical Record Number:  [id number]   Reason for Referral: stroke follow up    CHIEF COMPLAINT:  Chief Complaint  Patient presents with   Follow-up    Rm 3 with spouse Abbe Amsterdam Pt is well and stable, no new concerns      HPI:  Update 06/20/2021 JM: Returns for 84-month stroke follow-up.  Overall stable without new stroke/TIA symptoms.  Reports residual gait impairment and mild left hand weakness but overall stable. Use of RW outdoors, use of cane occasionally indoors.  Denies any falls.  Compliant on aspirin and atorvastatin without side effects.  Blood pressure today 119/62. Did have recent f/u with PCP.  Loop recorder has not seen atrial fibrillation thus far.  No new concerns at this time.    History provided for reference purposes only  Update 12/14/2020 JM: Deanna [name] returns for 50-month stroke follow-up accompanied by her husband, Abbe Amsterdam.  Overall doing well.  Residual mild imbalance with use of RW outdoors but no AD usually in home - denies any recent falls. Denies residual left hand weakness.  Denies new stroke/TIA symptoms.  Compliant on aspirin and atorvastatin without associated side effects.  Blood pressure today 146/78 - occasionally monitors BP at home.  Loop recorder has not shown atrial fibrillation thus far.  Did have recent physical beginning of July with Pcp - lab work stable per patient (unable to view via epic).  No further concerns at this time.  Update 06/14/2020 JM: Deanna [name] returns for 60-month stroke follow-up accompanied by her husband.  Doing well since prior visit without new stroke/TIA symptoms and reports residual mild imbalance and very slight decrease left hand dexterity.  Continues to use RW for long distance and cane for short distance around her home.  Denies any recent falls.  Remains on aspirin 81 mg  daily and atorvastatin 40 mg daily for secondary stroke prevention.  She reports recent lab work completed by PCP which was satisfactory per patient.  Blood pressure today 140/64 -monitors at home and typically 120s/60s.  Loop recorder has not shown atrial fibrillation thus far.  She does report since her stroke, she has been having difficulty with her skin especially on her hands, forehead and [name] where at times can be itchy but is constantly flaking despite use of creams and special shampoos.  No concerns at this time.  Update 11/26/2019 JM: Deanna [name] returns for stroke follow-up accompanied by her husband.  She has been stable since prior visit with residual mild imbalance and mild decreased left hand dexterity but overall great improvement. Will use cane inside of home and RW outdoors -denies any recent falls.  Denies new stroke/TIA symptoms.  Remains on aspirin 81 mg daily and atorvastatin for secondary stroke prevention without side effects.  Blood pressure today elevated but monitors at home and typically stable.  Loop recorder has not shown atrial fibrillation thus far.  Repeat CTA showed severe right MCA M2 stenosis with evaluation by Dr. Estanislado Pandy who recommended image guided diagnostic cerebral arteriogram 3 months from CTA (around 07/2019) but apparently they have had difficulty contacting office.  She is questioning need of procedure as she has been feeling good and is asymptomatic.  No concerns at this time.  Update 05/20/2019: Deanna [name] is a 71 year old female who is being seen today for stroke follow-up.  Residual  deficits of mild LUE weakness and mild imbalance but overall greatly improving.  She does continue to ambulate with Rollator walker outdoors and will use four-point cane in her home.  Denies any recent falls she has recently completed PT/OT but plans on starting additional PT at [facility] physical therapy.  Repeat lower extremity ultrasound showed resolution of acute DVT therefore  Eliquis discontinued and was initiated on aspirin 81 mg daily.  She has continued on aspirin without bleeding or bruising.  Continues on atorvastatin without myalgias.  She plans on following up with her PCP in the near future for repeat lab work.  Blood pressure today 139/67.  Loop recorder is not shown atrial fibrillation thus far.  Denies new or worsening stroke/TIA symptoms.  Initial visit 02/04/2019: Deanna [name] is being seen today, 02/04/2019, for hospital follow-up accompanied by her daughter.  Residual deficits left hemiparesis with improvement.  Denies residual vision or speech difficulties.  She will be completing OT shortly and continues to participate in home health PT.  Currently ambulates with rolling walker but in the process of transitioning to cane inside the house with assistance of PT.  Currently living with her husband but does have assistance of her daughters when needed.  She is becoming more independent with ADLs.  Currently on aspirin 81 mg daily and Eliquis 5 mg twice daily without bleeding or bruising.  Continues on atorvastatin 40 mg daily without myalgias.  Blood pressure today 146/82 but does monitor at home and typically lower.  Recently started on sertraline by PCP for increased anxiety.  Loop recorder is not shown atrial fibrillation thus far.  Denies new or worsening stroke/TIA symptoms.  Stroke admission 11/29/2018: Deanna [name] is a 71 y.o. female with a history of pre diabetes and asthma but on no medications, presented to [facility] ED on 11/29/2018 with left-sided tingling and right gaze preference.  Stroke work-up revealed large right MCA M2 punctate left frontal temporal infarcts as evidenced on MRI status post TPA and IR with right MCA TICI 2B revascularization embolic pattern secondary to unknown etiology.  Post TPA imaging showed small hemorrhage stable.  TEE unremarkable therefore loop recorder placed for long-term monitoring of atrial fibrillation and stroke etiology.   Initiated aspirin 81 mg daily.  No DAPT due to post TPA hemorrhage.  HTN stable long-term BP goal normotensive range.  LDL 103 initiated atorvastatin 40 mg daily.  Pre-DM with A1c 5.9.  Other stroke risk factors include advanced age and morbid obesity but no prior history of stroke.  Residual deficits from right gaze preference, left homonymous hemianopia, mild dysarthria, mild left facial weakness and left hemiparesis.  Discharged to CIR for ongoing therapies.  CT [name] - Acute posterior right MCA infarct with hyperdense right M2 branches. CTA H&N - Proximal right M2 occlusion.  CT Penumbra - Associated acute right MCA infarct with penumbra.  Cerebral angio TICI2b+ revascularization of occluded Rt MCA inf division with x 1 pass embotrap and penumbra. Post IR CT no gross hmg, contrast blush present R post frontal parietal region MRI [name] -mod R MCA infarct w/ confluent petechial hmg. 2 small L punctate  Frontotemporal infarcts  Repeat stat CT 7/20 stable, no significant change 2D Echo EF > 65%. No source of embolus  TEE no PFO, no SOE Loop placed 12/02/2018 [name] Virus 2 - negative LDL - 103 HgbA1c - 5.9 No antithrombotic prior to admission, now on aspirin 81 mg daily post tPA Therapy recommendations:  CIR Disposition:  CIR  During CIR admission, repeat LE venous Doppler showed progression of acute DVT from left PTV to left popliteal vein therefore recommended anticoagulation with Eliquis 5 mg twice daily as repeat CT [name] stable without new bleeding or hematoma.  She was discharged home with recommendation of home health therapy on 12/26/2018.   ROS:   14 system review of systems performed and negative with exception of those listed in HPI  PMH:  Past Medical History:  Diagnosis Date   Arthritis    osteoarthritis in  both knees   Asthma    Difficult intravenous access    GERD (gastroesophageal reflux disease)    Heart murmur    born with years ago   Pre-diabetes    last  A1C=6.0 per pt   Torn meniscus    left knee w fracture x2 to left knee   Wears glasses     PSH:  Past Surgical History:  Procedure Laterality Date   BUBBLE STUDY  12/02/2018   Procedure: BUBBLE STUDY;  Surgeon: Elouise Munroe, MD;  Location: [place];  Service: Cardiology;;   CHOLECYSTECTOMY     CHONDROPLASTY Right 07/09/2017   Procedure: CHONDROPLASTY;  Surgeon: Sydnee Cabal, MD;  Location: [facility];  Service: Orthopedics;  Laterality: Right;   HERNIA REPAIR Right age 66   inguinal   IR CT [name] LTD  11/29/2018   IR PERCUTANEOUS ART THROMBECTOMY/INFUSION INTRACRANIAL INC DIAG ANGIO  11/29/2018   IR RADIOLOGIST EVAL & MGMT  06/18/2019   KNEE ARTHROSCOPY WITH MEDIAL MENISECTOMY Left 01/10/2017   Procedure: LEFT KNEE ARTHROSCOPY, DEBRIDEMENT, PARTIAL MEDIAL MENISCECTOMY, CHONDROPLASTY, ARTHROSCOPIC ASSISTED INTERNAL FIXATION OF MEDIAL FEMORAL CONDYLE AND MEDIAL TIBIAL PLATEAU INSUFFICIENCY FRACTURES;  Surgeon: Sydnee Cabal, MD;  Location: [place];  Service: Orthopedics;  Laterality: Left;   KNEE ARTHROSCOPY WITH MEDIAL MENISECTOMY Right 07/09/2017   Procedure: Right knee scope, debridement, chondroplasty, partial meniscectomy, internal arthroscopic assisted internal fixation of medial tibial plateau fracture;  Surgeon: Sydnee Cabal, MD;  Location: [facility];  Service: Orthopedics;  Laterality: Right;   LOOP RECORDER INSERTION N/A 12/02/2018   Procedure: LOOP RECORDER INSERTION;  Surgeon: Evans Lance, MD;  Location: [place] [facility];  Service: Cardiovascular;  Laterality: N/A;   RADIOLOGY WITH ANESTHESIA N/A 11/29/2018   Procedure: RADIOLOGY WITH ANESTHESIA;  Surgeon: Luanne Bras, MD;  Location: [place];  Service: Radiology;  Laterality: N/A;   TEE WITHOUT CARDIOVERSION N/A 12/02/2018   Procedure: TRANSESOPHAGEAL ECHOCARDIOGRAM (TEE);  Surgeon: Elouise Munroe, MD;  Location: [place];  Service: Cardiology;   Laterality: N/A;   TUBAL LIGATION  age 50    Social History:  Social History   Socioeconomic History   Marital status: Married    Spouse name: Not on file   Number of children: Not on file   Years of education: Not on file   Highest education level: Not on file  Occupational History   Not on file  Tobacco Use   Smoking status: Never   Smokeless tobacco: Never  Vaping Use   Vaping Use: Never used  Substance and Sexual Activity   Alcohol use: No   Drug use: No   Sexual activity: Not on file  Other Topics Concern   Not on file  Social History Narrative   Not on file   Social Determinants of Health   Financial Resource Strain: Not on file  Food Insecurity: Not on file  Transportation Needs: Not on file  Physical Activity: Not on file  Stress: Not on file  Social Connections: Not on file  Intimate Partner Violence: Not on file    Family History:  Family History  Problem Relation Age of Onset   Stroke Father    Stroke Brother     Medications:   Current Outpatient Medications on File Prior to Visit  Medication Sig Dispense Refill   aspirin EC 81 MG tablet Take 81 mg by mouth daily.     atorvastatin (LIPITOR) 40 MG tablet Take 1 tablet (40 mg total) by mouth daily at 6 PM. 30 tablet 1   Calcium Carbonate (CALCIUM 600 PO) Take 1 tablet by mouth daily. 1     diclofenac Sodium (VOLTAREN) 1 % GEL APPLY 2 G TOPICALLY 4 (FOUR) TIMES DAILY. TO LEFT KNEE (Patient taking differently: as needed.) 300 g 2   famotidine (PEPCID) 10 MG tablet Take 10 mg by mouth daily.     Multiple Vitamin (MULTIVITAMIN) tablet Take 1 tablet by mouth daily.     sertraline (ZOLOFT) 50 MG tablet Take 40 mg by mouth daily.     No current facility-administered medications on file prior to visit.    Allergies:  No Known Allergies   Physical Exam  Vitals:   06/21/21 0736  BP: 119/62  Pulse: 71  Weight: 226 lb (102.5 kg)  Height: 5\' 4"  (1.626 m)   Body mass index is 38.79 kg/m. No  results found.   General: Obese pleasant middle-age Caucasian female, seated, in no evident distress [name]: [name] normocephalic and atraumatic.   Neck: supple with no carotid or supraclavicular bruits Cardiovascular: regular rate and rhythm, no murmurs Musculoskeletal: no deformity Skin:  no rash/petichiae Vascular:  Normal pulses all extremities   Neurologic Exam Mental Status: Awake and fully alert.  Fluent speech and language.  Oriented to place and time. Recent and remote memory intact. Attention span, concentration and fund of knowledge appropriate. Mood and affect appropriate.  Cranial Nerves: Pupils equal, briskly reactive to light. Extraocular movements full without nystagmus. Visual fields full to confrontation.   Hearing intact. Facial sensation intact.   Motor: Normal bulk and tone.  Full strength in all tested extremities except slightly decreased left hand fine motor control Sensory.: intact to touch , pinprick , position and vibratory sensation.  Coordination: Rapid alternating movements normal in all extremities except decreased mildly left hand dexterity. Finger-to-nose and heel-to-shin performed accurately bilaterally.  Orbits right arm on the left arm Gait and Station: Arises from chair without difficulty. Stance is normal. Gait demonstrates  wide-based gait with assistance of rolling walker and mild stiffening of left leg.  Tandem walking heel toe not attempted Reflexes: 1+ and symmetric. Toes downgoing.       ASSESSMENT: Deanna [name] is a 71 y.o. year old female presented with left-sided tingling and right gaze preference on 11/29/2018 with stroke work-up revealing large right MCA and 2 punctate left frontotemporal infarcts s/p TPA and IR with right MCA revascularization secondary to unknown etiology.  s/p loop recorder.  History of DVT treated with Eliquis.   Vascular risk factors include HTN, HLD, pre-DM and morbid obesity.     PLAN:  Cryptogenic stroke:  -Residual  deficits: Mild LUE weakness and imbalance - stable -continue aspirin 81 mg daily  and Lipitor for secondary stroke prevention.    -Loop recorder has not shown atrial fibrillation thus far -monthly reports personally reviewed -Discussed secondary stroke prevention's and importance of close follow-up with PCP for aggressive stroke risk factor management - reports recent lab work  by PCP -satisfactory per pt (unable to view via epic) Occlusion of right MCA: s/p thrombectomy. CTA 07/07/2020 and 02/01/2021 stable.  Followed by Dr. Estanislado Pandy -plans on repeat CTA [name]/neck next month HTN: BP goal<130/90.  Well-controlled on nonpharmacological management per PCP HLD: LDL goal<70. Lipid panel 11/2020 LDL 60.  On atorvastatin 40 mg daily per PCP.    Overall stable without further recommendations. Follow up as needed   CC:  Glenis Smoker, MD    I spent 26 minutes of face-to-face and non-face-to-face time with patient and husband.  This included previsit chart review, lab review, study review, electronic health record documentation, patient education regarding prior stroke with residual deficits, intracranial stenosis, importance of managing stroke risk factors and secondary stroke prevention measures and answered all other questions to patient and husband satisfaction  Frann Rider, AGNP-BC  [facility] [address] [place], [place] [address]  Phone [phone] Fax [phone] Note: This document was prepared with digital dictation and possible smart phrase technology. Any transcriptional errors that result from this process are unintentional.

## 2021-06-20 NOTE — Progress Notes (Signed)
[facility] Summary Report / Loop Recorder 

## 2021-06-21 ENCOUNTER — Ambulatory Visit (INDEPENDENT_AMBULATORY_CARE_PROVIDER_SITE_OTHER): Payer: Medicare Other | Admitting: Adult Health

## 2021-06-21 ENCOUNTER — Other Ambulatory Visit: Payer: Self-pay

## 2021-06-21 ENCOUNTER — Encounter: Payer: Self-pay | Admitting: Adult Health

## 2021-06-21 VITALS — BP 119/62 | HR 71 | Ht 64.0 in | Wt 226.0 lb

## 2021-06-21 DIAGNOSIS — I10 Essential (primary) hypertension: Secondary | ICD-10-CM | POA: Diagnosis not present

## 2021-06-21 DIAGNOSIS — I69398 Other sequelae of cerebral infarction: Secondary | ICD-10-CM | POA: Diagnosis not present

## 2021-06-21 DIAGNOSIS — R269 Unspecified abnormalities of gait and mobility: Secondary | ICD-10-CM

## 2021-06-21 DIAGNOSIS — E785 Hyperlipidemia, unspecified: Secondary | ICD-10-CM

## 2021-06-21 DIAGNOSIS — I639 Cerebral infarction, unspecified: Secondary | ICD-10-CM

## 2021-06-21 NOTE — Patient Instructions (Signed)
Continue aspirin 81 mg daily  and atorvastatin for secondary stroke prevention  Your loop recorder will continue to be monitored by cardiology - has not shown atrial fibrillation thus far   Continue to follow up with PCP regarding cholesterol and blood pressure management  Maintain strict control of hypertension with blood pressure goal below 130/90 and cholesterol with LDL cholesterol (bad cholesterol) goal below 70 mg/dL.   Signs of a Stroke? Follow the BEFAST method:  Balance Watch for a sudden loss of balance, trouble with coordination or vertigo Eyes Is there a sudden loss of vision in one or both eyes? Or double vision?  Face: Ask the person to smile. Does one side of the face droop or is it numb?  Arms: Ask the person to raise both arms. Does one arm drift downward? Is there weakness or numbness of a leg? Speech: Ask the person to repeat a simple phrase. Does the speech sound slurred/strange? Is the person confused ? Time: If you observe any of these signs, call 911.       Thank you for coming to see [place] at [facility]. I hope we have been able to provide you high quality care today.  You may receive a patient satisfaction survey over the next few weeks. We would appreciate your feedback and comments so that we may continue to improve ourselves and the health of our patients.

## 2021-07-12 DIAGNOSIS — Z20822 Contact with and (suspected) exposure to covid-19: Secondary | ICD-10-CM | POA: Diagnosis not present

## 2021-07-17 ENCOUNTER — Ambulatory Visit (INDEPENDENT_AMBULATORY_CARE_PROVIDER_SITE_OTHER): Payer: Medicare Other

## 2021-07-17 DIAGNOSIS — I639 Cerebral infarction, unspecified: Secondary | ICD-10-CM | POA: Diagnosis not present

## 2021-07-17 LAB — CUP PACEART REMOTE DEVICE CHECK
Date Time Interrogation Session: 20230303232733
Implantable Pulse Generator Implant Date: 20200721

## 2021-07-24 DIAGNOSIS — L820 Inflamed seborrheic keratosis: Secondary | ICD-10-CM | POA: Diagnosis not present

## 2021-07-24 DIAGNOSIS — L821 Other seborrheic keratosis: Secondary | ICD-10-CM | POA: Diagnosis not present

## 2021-07-27 NOTE — Progress Notes (Signed)
[facility] Summary Report / Loop Recorder 

## 2021-08-09 DIAGNOSIS — Z20822 Contact with and (suspected) exposure to covid-19: Secondary | ICD-10-CM | POA: Diagnosis not present

## 2021-08-21 ENCOUNTER — Ambulatory Visit (INDEPENDENT_AMBULATORY_CARE_PROVIDER_SITE_OTHER): Payer: Medicare Other

## 2021-08-21 DIAGNOSIS — I639 Cerebral infarction, unspecified: Secondary | ICD-10-CM | POA: Diagnosis not present

## 2021-08-22 LAB — CUP PACEART REMOTE DEVICE CHECK
Date Time Interrogation Session: 20230406000500
Implantable Pulse Generator Implant Date: 20200721

## 2021-08-30 DIAGNOSIS — Z20822 Contact with and (suspected) exposure to covid-19: Secondary | ICD-10-CM | POA: Diagnosis not present

## 2021-09-05 DIAGNOSIS — Z20822 Contact with and (suspected) exposure to covid-19: Secondary | ICD-10-CM | POA: Diagnosis not present

## 2021-09-05 NOTE — Progress Notes (Signed)
[facility] Summary Report / Loop Recorder 

## 2021-09-12 DIAGNOSIS — Z20822 Contact with and (suspected) exposure to covid-19: Secondary | ICD-10-CM | POA: Diagnosis not present

## 2021-09-14 DIAGNOSIS — Z20822 Contact with and (suspected) exposure to covid-19: Secondary | ICD-10-CM | POA: Diagnosis not present

## 2021-09-18 DIAGNOSIS — Z20822 Contact with and (suspected) exposure to covid-19: Secondary | ICD-10-CM | POA: Diagnosis not present

## 2021-09-25 ENCOUNTER — Ambulatory Visit (INDEPENDENT_AMBULATORY_CARE_PROVIDER_SITE_OTHER): Payer: Medicare Other

## 2021-09-25 DIAGNOSIS — I639 Cerebral infarction, unspecified: Secondary | ICD-10-CM

## 2021-09-25 LAB — CUP PACEART REMOTE DEVICE CHECK
Date Time Interrogation Session: 20230510230420
Implantable Pulse Generator Implant Date: 20200721

## 2021-10-13 NOTE — Progress Notes (Signed)
[facility] Summary Report / Loop Recorder 

## 2021-10-30 ENCOUNTER — Ambulatory Visit (INDEPENDENT_AMBULATORY_CARE_PROVIDER_SITE_OTHER): Payer: Medicare Other

## 2021-10-30 DIAGNOSIS — I639 Cerebral infarction, unspecified: Secondary | ICD-10-CM | POA: Diagnosis not present

## 2021-10-31 LAB — CUP PACEART REMOTE DEVICE CHECK
Date Time Interrogation Session: 20230612231616
Implantable Pulse Generator Implant Date: 20200721

## 2021-11-21 NOTE — Progress Notes (Signed)
[facility] Summary Report / Loop Recorder 

## 2021-11-30 DIAGNOSIS — Z0000 Encounter for general adult medical examination without abnormal findings: Secondary | ICD-10-CM | POA: Diagnosis not present

## 2021-11-30 DIAGNOSIS — E7800 Pure hypercholesterolemia, unspecified: Secondary | ICD-10-CM | POA: Diagnosis not present

## 2021-11-30 DIAGNOSIS — K219 Gastro-esophageal reflux disease without esophagitis: Secondary | ICD-10-CM | POA: Diagnosis not present

## 2021-11-30 DIAGNOSIS — R7303 Prediabetes: Secondary | ICD-10-CM | POA: Diagnosis not present

## 2021-11-30 DIAGNOSIS — F419 Anxiety disorder, unspecified: Secondary | ICD-10-CM | POA: Diagnosis not present

## 2021-11-30 DIAGNOSIS — G8194 Hemiplegia, unspecified affecting left nondominant side: Secondary | ICD-10-CM | POA: Diagnosis not present

## 2021-11-30 DIAGNOSIS — R030 Elevated blood-pressure reading, without diagnosis of hypertension: Secondary | ICD-10-CM | POA: Diagnosis not present

## 2021-11-30 DIAGNOSIS — I700 Atherosclerosis of aorta: Secondary | ICD-10-CM | POA: Diagnosis not present

## 2021-12-04 ENCOUNTER — Ambulatory Visit (INDEPENDENT_AMBULATORY_CARE_PROVIDER_SITE_OTHER): Payer: Medicare Other

## 2021-12-04 DIAGNOSIS — I63511 Cerebral infarction due to unspecified occlusion or stenosis of right middle cerebral artery: Secondary | ICD-10-CM

## 2021-12-04 LAB — CUP PACEART REMOTE DEVICE CHECK
Date Time Interrogation Session: 20230715231638
Implantable Pulse Generator Implant Date: 20200721

## 2022-01-05 NOTE — Progress Notes (Signed)
[facility] Summary Report / Loop Recorder 

## 2022-01-08 ENCOUNTER — Ambulatory Visit (INDEPENDENT_AMBULATORY_CARE_PROVIDER_SITE_OTHER): Payer: Medicare Other

## 2022-01-08 DIAGNOSIS — I63511 Cerebral infarction due to unspecified occlusion or stenosis of right middle cerebral artery: Secondary | ICD-10-CM

## 2022-01-08 LAB — CUP PACEART REMOTE DEVICE CHECK
Date Time Interrogation Session: 20230817232110
Implantable Pulse Generator Implant Date: 20200721

## 2022-01-09 ENCOUNTER — Telehealth: Payer: Self-pay | Admitting: Neurology

## 2022-01-09 NOTE — Telephone Encounter (Signed)
Patient needs to contact cardiology for loop recorder concerns. Thank you.

## 2022-01-09 NOTE — Telephone Encounter (Signed)
Called patient, spoke with husband and advised he call cardiology for loop recorder questions, management. He  verbalized understanding, appreciation.

## 2022-01-09 NOTE — Telephone Encounter (Signed)
Pt called stating that her loop recorder continues to send her messages but when she checks they are blank messages. She states that she had discontinued using it in 2/23. Pt would like to speak to the RN to see if it is ok to disconnect it.

## 2022-01-17 ENCOUNTER — Telehealth (HOSPITAL_COMMUNITY): Payer: Self-pay

## 2022-01-17 NOTE — Telephone Encounter (Signed)
Called to schedule cta head/neck, no answer, left vm. AW 

## 2022-01-18 ENCOUNTER — Other Ambulatory Visit (HOSPITAL_COMMUNITY): Payer: Self-pay | Admitting: Interventional Radiology

## 2022-01-18 DIAGNOSIS — I639 Cerebral infarction, unspecified: Secondary | ICD-10-CM

## 2022-01-25 DIAGNOSIS — Z23 Encounter for immunization: Secondary | ICD-10-CM | POA: Diagnosis not present

## 2022-02-01 NOTE — Progress Notes (Signed)
[facility] Summary Report / Loop Recorder 

## 2022-02-05 DIAGNOSIS — Z23 Encounter for immunization: Secondary | ICD-10-CM | POA: Diagnosis not present

## 2022-02-07 ENCOUNTER — Telehealth (HOSPITAL_COMMUNITY): Payer: Self-pay

## 2022-02-07 NOTE — Telephone Encounter (Signed)
Returned call to pt regarding f/u's. She wanted to know if she needed to continue doing her cta's since she was doing fine and neurology had released her. Per Dr. Estanislado Pandy, she still has some narrowing that appears stable. She does not have to continue f/u's if she doesn't want to. She will need to f/u with neurology if she develops stroke symptoms in the future. Pt agreed with this plan. AW

## 2022-02-07 NOTE — Telephone Encounter (Signed)
-----   Message from Tera Mater, PA-C sent at 01/18/2022  1:48 PM EDT ----- Regarding: RE: Follow-ups Ash,   Dr. D says that she still has some narrowing in her right middle cerebral artery but it looks stable, so if she does not want to get another CTA head and neck it is fine with him.  Please encourage her to see neurology urgently if she develops stroke symptoms in the future.   Thank you,  Aimee   ----- Message ----- From: Danielle Dess Sent: 01/18/2022  10:57 AM EDT To: Tera Mater, PA-C Subject: Follow-ups                                     Aimee,   I called to schedule pt's f/u cta. She is wondering if she still needs to continue f/u's. She said that neurology has released her bc she has been stable and she feels great and she wanted to check with Dev before scheduling this. Please advise.   Thanks,  Lia Foyer

## 2022-02-12 ENCOUNTER — Ambulatory Visit (INDEPENDENT_AMBULATORY_CARE_PROVIDER_SITE_OTHER): Payer: Medicare Other

## 2022-02-12 DIAGNOSIS — I63511 Cerebral infarction due to unspecified occlusion or stenosis of right middle cerebral artery: Secondary | ICD-10-CM | POA: Diagnosis not present

## 2022-02-13 LAB — CUP PACEART REMOTE DEVICE CHECK
Date Time Interrogation Session: 20231001232039
Implantable Pulse Generator Implant Date: 20200721

## 2022-02-19 ENCOUNTER — Telehealth: Payer: Self-pay

## 2022-02-19 NOTE — Telephone Encounter (Signed)
ILR @ [facility] 02/17/22.   Called patient to advise and discussed options of explant of leave in. Patient voiced she would like to leave device in.   Marked "I" in [employer]. Discontinued from [place]. Canceled future remotes in Epic.  Return kit requested from [employer].

## 2022-02-27 NOTE — Progress Notes (Signed)
[facility] Summary Report / Loop Recorder 

## 2022-05-15 ENCOUNTER — Other Ambulatory Visit: Payer: Self-pay | Admitting: Family Medicine

## 2022-05-15 DIAGNOSIS — E2839 Other primary ovarian failure: Secondary | ICD-10-CM

## 2022-05-15 DIAGNOSIS — Z1231 Encounter for screening mammogram for malignant neoplasm of breast: Secondary | ICD-10-CM

## 2022-06-08 DIAGNOSIS — R7303 Prediabetes: Secondary | ICD-10-CM | POA: Diagnosis not present

## 2022-06-08 DIAGNOSIS — F419 Anxiety disorder, unspecified: Secondary | ICD-10-CM | POA: Diagnosis not present

## 2022-06-08 DIAGNOSIS — G8194 Hemiplegia, unspecified affecting left nondominant side: Secondary | ICD-10-CM | POA: Diagnosis not present

## 2022-06-08 DIAGNOSIS — E7800 Pure hypercholesterolemia, unspecified: Secondary | ICD-10-CM | POA: Diagnosis not present

## 2022-06-08 DIAGNOSIS — I700 Atherosclerosis of aorta: Secondary | ICD-10-CM | POA: Diagnosis not present

## 2022-07-03 ENCOUNTER — Ambulatory Visit
Admission: RE | Admit: 2022-07-03 | Discharge: 2022-07-03 | Disposition: A | Discharge status: Home / Self Care | Payer: Medicare Other | Source: Ambulatory Visit | Attending: Family Medicine | Admitting: Family Medicine

## 2022-07-03 DIAGNOSIS — Z1231 Encounter for screening mammogram for malignant neoplasm of breast: Secondary | ICD-10-CM | POA: Diagnosis not present

## 2022-10-12 DIAGNOSIS — R600 Localized edema: Secondary | ICD-10-CM | POA: Diagnosis not present

## 2022-10-18 DIAGNOSIS — R4681 Obsessive-compulsive behavior: Secondary | ICD-10-CM | POA: Diagnosis not present

## 2022-10-18 DIAGNOSIS — R600 Localized edema: Secondary | ICD-10-CM | POA: Diagnosis not present

## 2022-10-18 DIAGNOSIS — F419 Anxiety disorder, unspecified: Secondary | ICD-10-CM | POA: Diagnosis not present

## 2022-10-29 ENCOUNTER — Ambulatory Visit
Admission: RE | Admit: 2022-10-29 | Discharge: 2022-10-29 | Disposition: A | Discharge status: Home / Self Care | Payer: Medicare Other | Source: Ambulatory Visit | Attending: Family Medicine | Admitting: Family Medicine

## 2022-10-29 DIAGNOSIS — E2839 Other primary ovarian failure: Secondary | ICD-10-CM

## 2022-10-29 DIAGNOSIS — M8580 Other specified disorders of bone density and structure, unspecified site: Secondary | ICD-10-CM | POA: Diagnosis not present

## 2022-10-29 DIAGNOSIS — M810 Age-related osteoporosis without current pathological fracture: Secondary | ICD-10-CM | POA: Diagnosis not present

## 2022-10-29 DIAGNOSIS — Z791 Long term (current) use of non-steroidal anti-inflammatories (NSAID): Secondary | ICD-10-CM | POA: Diagnosis not present

## 2022-10-29 DIAGNOSIS — N951 Menopausal and female climacteric states: Secondary | ICD-10-CM | POA: Diagnosis not present

## 2022-10-29 DIAGNOSIS — E349 Endocrine disorder, unspecified: Secondary | ICD-10-CM | POA: Diagnosis not present

## 2022-12-12 DIAGNOSIS — R600 Localized edema: Secondary | ICD-10-CM | POA: Diagnosis not present

## 2022-12-12 DIAGNOSIS — R7303 Prediabetes: Secondary | ICD-10-CM | POA: Diagnosis not present

## 2022-12-12 DIAGNOSIS — E7800 Pure hypercholesterolemia, unspecified: Secondary | ICD-10-CM | POA: Diagnosis not present

## 2022-12-12 DIAGNOSIS — K219 Gastro-esophageal reflux disease without esophagitis: Secondary | ICD-10-CM | POA: Diagnosis not present

## 2022-12-12 DIAGNOSIS — I700 Atherosclerosis of aorta: Secondary | ICD-10-CM | POA: Diagnosis not present

## 2022-12-12 DIAGNOSIS — R030 Elevated blood-pressure reading, without diagnosis of hypertension: Secondary | ICD-10-CM | POA: Diagnosis not present

## 2022-12-12 DIAGNOSIS — Z0000 Encounter for general adult medical examination without abnormal findings: Secondary | ICD-10-CM | POA: Diagnosis not present

## 2022-12-12 DIAGNOSIS — G8194 Hemiplegia, unspecified affecting left nondominant side: Secondary | ICD-10-CM | POA: Diagnosis not present

## 2022-12-12 DIAGNOSIS — F419 Anxiety disorder, unspecified: Secondary | ICD-10-CM | POA: Diagnosis not present

## 2023-01-24 DIAGNOSIS — Z23 Encounter for immunization: Secondary | ICD-10-CM | POA: Diagnosis not present

## 2023-01-30 DIAGNOSIS — Z23 Encounter for immunization: Secondary | ICD-10-CM | POA: Diagnosis not present

## 2023-01-30 DIAGNOSIS — Z1283 Encounter for screening for malignant neoplasm of skin: Secondary | ICD-10-CM | POA: Diagnosis not present

## 2023-01-30 DIAGNOSIS — F408 Other phobic anxiety disorders: Secondary | ICD-10-CM | POA: Diagnosis not present

## 2023-01-30 DIAGNOSIS — D225 Melanocytic nevi of trunk: Secondary | ICD-10-CM | POA: Diagnosis not present

## 2023-06-19 DIAGNOSIS — F419 Anxiety disorder, unspecified: Secondary | ICD-10-CM | POA: Diagnosis not present

## 2023-06-19 DIAGNOSIS — I639 Cerebral infarction, unspecified: Secondary | ICD-10-CM | POA: Diagnosis not present

## 2023-06-19 DIAGNOSIS — R600 Localized edema: Secondary | ICD-10-CM | POA: Diagnosis not present

## 2023-06-19 DIAGNOSIS — E7800 Pure hypercholesterolemia, unspecified: Secondary | ICD-10-CM | POA: Diagnosis not present

## 2023-06-19 DIAGNOSIS — R7303 Prediabetes: Secondary | ICD-10-CM | POA: Diagnosis not present

## 2023-08-05 ENCOUNTER — Other Ambulatory Visit: Payer: Self-pay | Admitting: Family Medicine

## 2023-08-05 DIAGNOSIS — Z1231 Encounter for screening mammogram for malignant neoplasm of breast: Secondary | ICD-10-CM

## 2023-08-16 ENCOUNTER — Ambulatory Visit
Admission: RE | Admit: 2023-08-16 | Discharge: 2023-08-16 | Disposition: A | Discharge status: Home / Self Care | Source: Ambulatory Visit | Attending: Family Medicine | Admitting: Family Medicine

## 2023-08-16 DIAGNOSIS — Z1231 Encounter for screening mammogram for malignant neoplasm of breast: Secondary | ICD-10-CM | POA: Diagnosis not present

## 2023-11-09 ENCOUNTER — Encounter (HOSPITAL_COMMUNITY): Payer: Self-pay | Admitting: Interventional Radiology

## 2023-12-25 DIAGNOSIS — R600 Localized edema: Secondary | ICD-10-CM | POA: Diagnosis not present

## 2023-12-25 DIAGNOSIS — G8194 Hemiplegia, unspecified affecting left nondominant side: Secondary | ICD-10-CM | POA: Diagnosis not present

## 2023-12-25 DIAGNOSIS — L400 Psoriasis vulgaris: Secondary | ICD-10-CM | POA: Diagnosis not present

## 2023-12-25 DIAGNOSIS — R030 Elevated blood-pressure reading, without diagnosis of hypertension: Secondary | ICD-10-CM | POA: Diagnosis not present

## 2023-12-25 DIAGNOSIS — R7303 Prediabetes: Secondary | ICD-10-CM | POA: Diagnosis not present

## 2023-12-25 DIAGNOSIS — R682 Dry mouth, unspecified: Secondary | ICD-10-CM | POA: Diagnosis not present

## 2023-12-25 DIAGNOSIS — F419 Anxiety disorder, unspecified: Secondary | ICD-10-CM | POA: Diagnosis not present

## 2023-12-25 DIAGNOSIS — E7800 Pure hypercholesterolemia, unspecified: Secondary | ICD-10-CM | POA: Diagnosis not present

## 2023-12-25 DIAGNOSIS — Z0000 Encounter for general adult medical examination without abnormal findings: Secondary | ICD-10-CM | POA: Diagnosis not present

## 2024-01-22 DIAGNOSIS — L853 Xerosis cutis: Secondary | ICD-10-CM | POA: Diagnosis not present

## 2024-01-22 DIAGNOSIS — R21 Rash and other nonspecific skin eruption: Secondary | ICD-10-CM | POA: Diagnosis not present

## 2024-01-22 DIAGNOSIS — Z6841 Body Mass Index (BMI) 40.0 and over, adult: Secondary | ICD-10-CM | POA: Diagnosis not present

## 2024-01-22 DIAGNOSIS — R700 Elevated erythrocyte sedimentation rate: Secondary | ICD-10-CM | POA: Diagnosis not present

## 2024-01-22 DIAGNOSIS — R768 Other specified abnormal immunological findings in serum: Secondary | ICD-10-CM | POA: Diagnosis not present

## 2024-02-06 DIAGNOSIS — Z23 Encounter for immunization: Secondary | ICD-10-CM | POA: Diagnosis not present
